# Patient Record
Sex: Male | Born: 1955
Health system: Southern US, Community
[De-identification: ages and names within clinical notes are randomized; demographics above are authoritative.]

## PROBLEM LIST (undated history)

## (undated) DIAGNOSIS — I2510 Atherosclerotic heart disease of native coronary artery without angina pectoris: Secondary | ICD-10-CM

## (undated) DIAGNOSIS — IMO0002 Reserved for concepts with insufficient information to code with codable children: Secondary | ICD-10-CM

## (undated) DIAGNOSIS — K7460 Unspecified cirrhosis of liver: Secondary | ICD-10-CM

## (undated) DIAGNOSIS — I219 Acute myocardial infarction, unspecified: Secondary | ICD-10-CM

## (undated) DIAGNOSIS — G8929 Other chronic pain: Secondary | ICD-10-CM

## (undated) DIAGNOSIS — J439 Emphysema, unspecified: Secondary | ICD-10-CM

## (undated) DIAGNOSIS — I714 Abdominal aortic aneurysm, without rupture, unspecified: Secondary | ICD-10-CM

## (undated) DIAGNOSIS — R0683 Snoring: Secondary | ICD-10-CM

## (undated) DIAGNOSIS — Z860100 Personal history of colon polyps, unspecified: Secondary | ICD-10-CM

## (undated) DIAGNOSIS — R51 Headache: Secondary | ICD-10-CM

## (undated) DIAGNOSIS — G2581 Restless legs syndrome: Secondary | ICD-10-CM

## (undated) DIAGNOSIS — A0472 Enterocolitis due to Clostridium difficile, not specified as recurrent: Secondary | ICD-10-CM

## (undated) DIAGNOSIS — R519 Headache, unspecified: Secondary | ICD-10-CM

## (undated) DIAGNOSIS — M549 Dorsalgia, unspecified: Secondary | ICD-10-CM

## (undated) DIAGNOSIS — K219 Gastro-esophageal reflux disease without esophagitis: Secondary | ICD-10-CM

## (undated) DIAGNOSIS — J449 Chronic obstructive pulmonary disease, unspecified: Secondary | ICD-10-CM

## (undated) DIAGNOSIS — I639 Cerebral infarction, unspecified: Secondary | ICD-10-CM

## (undated) DIAGNOSIS — G459 Transient cerebral ischemic attack, unspecified: Secondary | ICD-10-CM

## (undated) DIAGNOSIS — I9789 Other postprocedural complications and disorders of the circulatory system, not elsewhere classified: Secondary | ICD-10-CM

## (undated) DIAGNOSIS — E1142 Type 2 diabetes mellitus with diabetic polyneuropathy: Secondary | ICD-10-CM

## (undated) DIAGNOSIS — R160 Hepatomegaly, not elsewhere classified: Secondary | ICD-10-CM

## (undated) DIAGNOSIS — I00 Rheumatic fever without heart involvement: Secondary | ICD-10-CM

## (undated) DIAGNOSIS — R0602 Shortness of breath: Secondary | ICD-10-CM

## (undated) DIAGNOSIS — M5136 Other intervertebral disc degeneration, lumbar region: Secondary | ICD-10-CM

## (undated) DIAGNOSIS — N400 Enlarged prostate without lower urinary tract symptoms: Secondary | ICD-10-CM

## (undated) DIAGNOSIS — G4752 REM sleep behavior disorder: Secondary | ICD-10-CM

## (undated) DIAGNOSIS — R413 Other amnesia: Secondary | ICD-10-CM

## (undated) DIAGNOSIS — M79606 Pain in leg, unspecified: Secondary | ICD-10-CM

## (undated) DIAGNOSIS — E119 Type 2 diabetes mellitus without complications: Secondary | ICD-10-CM

## (undated) DIAGNOSIS — D696 Thrombocytopenia, unspecified: Secondary | ICD-10-CM

## (undated) DIAGNOSIS — I6381 Other cerebral infarction due to occlusion or stenosis of small artery: Secondary | ICD-10-CM

## (undated) DIAGNOSIS — E785 Hyperlipidemia, unspecified: Secondary | ICD-10-CM

## (undated) DIAGNOSIS — R161 Splenomegaly, not elsewhere classified: Secondary | ICD-10-CM

## (undated) DIAGNOSIS — I10 Essential (primary) hypertension: Secondary | ICD-10-CM

## (undated) DIAGNOSIS — I672 Cerebral atherosclerosis: Secondary | ICD-10-CM

## (undated) DIAGNOSIS — M5126 Other intervertebral disc displacement, lumbar region: Secondary | ICD-10-CM

## (undated) DIAGNOSIS — I209 Angina pectoris, unspecified: Secondary | ICD-10-CM

## (undated) DIAGNOSIS — M51369 Other intervertebral disc degeneration, lumbar region without mention of lumbar back pain or lower extremity pain: Secondary | ICD-10-CM

## (undated) HISTORY — DX: Atherosclerotic heart disease of native coronary artery without angina pectoris: I25.10

## (undated) HISTORY — PX: CORONARY ANGIOPLASTY WITH STENT PLACEMENT: SHX49

## (undated) HISTORY — PX: COLONOSCOPY: SHX174

## (undated) HISTORY — DX: Acute myocardial infarction, unspecified: I21.9

## (undated) HISTORY — DX: Essential (primary) hypertension: I10

## (undated) HISTORY — PX: CARDIAC CATHETERIZATION: SHX172

## (undated) HISTORY — DX: Cerebral infarction, unspecified: I63.9

## (undated) HISTORY — PX: OTHER SURGICAL HISTORY: SHX169

## (undated) HISTORY — DX: Hyperlipidemia, unspecified: E78.5

## (undated) HISTORY — DX: REM sleep behavior disorder: G47.52

## (undated) HISTORY — DX: Abdominal aortic aneurysm, without rupture: I71.4

## (undated) HISTORY — PX: TONSILLECTOMY: SUR1361

## (undated) HISTORY — DX: Other cerebral infarction due to occlusion or stenosis of small artery: I63.81

## (undated) HISTORY — PX: COLONOSCOPY W/ BIOPSIES: SHX1374

## (undated) HISTORY — DX: Chronic obstructive pulmonary disease, unspecified: J44.9

## (undated) HISTORY — DX: Shortness of breath: R06.02

## (undated) HISTORY — DX: Abdominal aortic aneurysm, without rupture, unspecified: I71.40

## (undated) HISTORY — DX: Other postprocedural complications and disorders of the circulatory system, not elsewhere classified: I97.89

## (undated) HISTORY — DX: Pain in leg, unspecified: M79.606

---

## 1998-05-11 ENCOUNTER — Emergency Department (HOSPITAL_COMMUNITY): Admission: EM | Admit: 1998-05-11 | Discharge: 1998-05-11 | Payer: Self-pay | Admitting: Emergency Medicine

## 2001-02-16 ENCOUNTER — Inpatient Hospital Stay (HOSPITAL_COMMUNITY): Admission: EM | Admit: 2001-02-16 | Discharge: 2001-02-22 | Payer: Self-pay | Admitting: Emergency Medicine

## 2001-02-16 ENCOUNTER — Encounter: Payer: Self-pay | Admitting: Emergency Medicine

## 2001-02-18 ENCOUNTER — Encounter: Payer: Self-pay | Admitting: Cardiothoracic Surgery

## 2001-02-18 HISTORY — PX: CORONARY ARTERY BYPASS GRAFT: SHX141

## 2001-02-19 ENCOUNTER — Encounter: Payer: Self-pay | Admitting: Cardiothoracic Surgery

## 2001-02-20 ENCOUNTER — Encounter: Payer: Self-pay | Admitting: Cardiothoracic Surgery

## 2001-02-22 ENCOUNTER — Encounter: Payer: Self-pay | Admitting: Cardiothoracic Surgery

## 2001-05-05 ENCOUNTER — Ambulatory Visit (HOSPITAL_COMMUNITY): Admission: RE | Admit: 2001-05-05 | Discharge: 2001-05-05 | Payer: Self-pay | Admitting: Interventional Cardiology

## 2001-05-21 ENCOUNTER — Ambulatory Visit (HOSPITAL_COMMUNITY): Admission: RE | Admit: 2001-05-21 | Discharge: 2001-05-22 | Payer: Self-pay | Admitting: Interventional Cardiology

## 2001-10-29 ENCOUNTER — Ambulatory Visit (HOSPITAL_COMMUNITY): Admission: RE | Admit: 2001-10-29 | Discharge: 2001-10-31 | Payer: Self-pay | Admitting: Interventional Cardiology

## 2001-11-16 ENCOUNTER — Encounter: Payer: Self-pay | Admitting: Gastroenterology

## 2001-11-16 ENCOUNTER — Encounter: Admission: RE | Admit: 2001-11-16 | Discharge: 2001-11-16 | Payer: Self-pay | Admitting: Gastroenterology

## 2001-11-26 ENCOUNTER — Encounter: Admission: RE | Admit: 2001-11-26 | Discharge: 2001-11-26 | Payer: Self-pay | Admitting: Gastroenterology

## 2001-11-26 ENCOUNTER — Encounter: Payer: Self-pay | Admitting: Gastroenterology

## 2002-04-16 ENCOUNTER — Ambulatory Visit (HOSPITAL_COMMUNITY): Admission: RE | Admit: 2002-04-16 | Discharge: 2002-04-16 | Payer: Self-pay | Admitting: Gastroenterology

## 2002-04-16 ENCOUNTER — Encounter (INDEPENDENT_AMBULATORY_CARE_PROVIDER_SITE_OTHER): Payer: Self-pay | Admitting: Specialist

## 2002-06-10 DIAGNOSIS — I639 Cerebral infarction, unspecified: Secondary | ICD-10-CM

## 2002-06-10 HISTORY — DX: Cerebral infarction, unspecified: I63.9

## 2002-10-27 ENCOUNTER — Encounter: Payer: Self-pay | Admitting: Interventional Cardiology

## 2002-10-27 ENCOUNTER — Ambulatory Visit (HOSPITAL_COMMUNITY): Admission: RE | Admit: 2002-10-27 | Discharge: 2002-10-27 | Payer: Self-pay | Admitting: Interventional Cardiology

## 2003-01-21 ENCOUNTER — Ambulatory Visit (HOSPITAL_COMMUNITY): Admission: RE | Admit: 2003-01-21 | Discharge: 2003-01-21 | Payer: Self-pay | Admitting: Neurology

## 2003-01-21 ENCOUNTER — Encounter: Payer: Self-pay | Admitting: Neurology

## 2003-11-21 ENCOUNTER — Encounter: Admission: RE | Admit: 2003-11-21 | Discharge: 2003-11-21 | Payer: Self-pay | Admitting: Interventional Cardiology

## 2003-11-23 ENCOUNTER — Ambulatory Visit (HOSPITAL_COMMUNITY): Admission: RE | Admit: 2003-11-23 | Discharge: 2003-11-23 | Payer: Self-pay | Admitting: Interventional Cardiology

## 2004-01-12 ENCOUNTER — Inpatient Hospital Stay (HOSPITAL_COMMUNITY): Admission: EM | Admit: 2004-01-12 | Discharge: 2004-01-14 | Payer: Self-pay | Admitting: Emergency Medicine

## 2006-10-23 ENCOUNTER — Ambulatory Visit: Payer: Self-pay | Admitting: *Deleted

## 2006-12-08 ENCOUNTER — Encounter: Admission: RE | Admit: 2006-12-08 | Discharge: 2007-03-08 | Payer: Self-pay

## 2007-02-03 ENCOUNTER — Ambulatory Visit: Payer: Self-pay | Admitting: Surgery

## 2007-02-03 ENCOUNTER — Encounter (INDEPENDENT_AMBULATORY_CARE_PROVIDER_SITE_OTHER): Payer: Self-pay | Admitting: Interventional Cardiology

## 2007-02-03 ENCOUNTER — Ambulatory Visit (HOSPITAL_COMMUNITY): Admission: RE | Admit: 2007-02-03 | Discharge: 2007-02-03 | Payer: Self-pay | Admitting: Interventional Cardiology

## 2007-06-11 DIAGNOSIS — E119 Type 2 diabetes mellitus without complications: Secondary | ICD-10-CM

## 2007-06-11 HISTORY — DX: Type 2 diabetes mellitus without complications: E11.9

## 2007-09-23 ENCOUNTER — Emergency Department (HOSPITAL_COMMUNITY): Admission: EM | Admit: 2007-09-23 | Discharge: 2007-09-23 | Payer: Self-pay | Admitting: Emergency Medicine

## 2007-09-25 ENCOUNTER — Encounter: Admission: RE | Admit: 2007-09-25 | Discharge: 2007-09-25 | Payer: Self-pay | Admitting: Orthopedic Surgery

## 2007-10-01 ENCOUNTER — Emergency Department (HOSPITAL_COMMUNITY): Admission: EM | Admit: 2007-10-01 | Discharge: 2007-10-01 | Payer: Self-pay | Admitting: Emergency Medicine

## 2007-10-22 ENCOUNTER — Ambulatory Visit: Payer: Self-pay | Admitting: *Deleted

## 2008-04-21 ENCOUNTER — Ambulatory Visit: Payer: Self-pay | Admitting: *Deleted

## 2008-08-11 ENCOUNTER — Inpatient Hospital Stay (HOSPITAL_BASED_OUTPATIENT_CLINIC_OR_DEPARTMENT_OTHER): Admission: RE | Admit: 2008-08-11 | Discharge: 2008-08-11 | Payer: Self-pay | Admitting: Interventional Cardiology

## 2008-10-20 ENCOUNTER — Ambulatory Visit: Payer: Self-pay | Admitting: *Deleted

## 2008-10-27 ENCOUNTER — Ambulatory Visit: Payer: Self-pay | Admitting: *Deleted

## 2008-10-27 ENCOUNTER — Encounter: Admission: RE | Admit: 2008-10-27 | Discharge: 2008-10-27 | Payer: Self-pay | Admitting: *Deleted

## 2009-04-28 ENCOUNTER — Ambulatory Visit: Payer: Self-pay | Admitting: Vascular Surgery

## 2009-07-28 ENCOUNTER — Encounter: Admission: RE | Admit: 2009-07-28 | Discharge: 2009-07-28 | Payer: Self-pay | Admitting: Gastroenterology

## 2009-10-25 ENCOUNTER — Ambulatory Visit: Payer: Self-pay | Admitting: Vascular Surgery

## 2009-11-09 ENCOUNTER — Emergency Department (HOSPITAL_COMMUNITY): Admission: EM | Admit: 2009-11-09 | Discharge: 2009-11-09 | Payer: Self-pay | Admitting: Emergency Medicine

## 2010-03-05 ENCOUNTER — Encounter: Admission: RE | Admit: 2010-03-05 | Discharge: 2010-03-05 | Payer: Self-pay | Admitting: Neurology

## 2010-04-26 ENCOUNTER — Ambulatory Visit: Payer: Self-pay | Admitting: Vascular Surgery

## 2010-05-28 ENCOUNTER — Ambulatory Visit: Payer: Self-pay | Admitting: Vascular Surgery

## 2010-09-20 LAB — POCT I-STAT 3, VENOUS BLOOD GAS (G3P V)
TCO2: 26 mmol/L (ref 0–100)
pCO2, Ven: 46.6 mmHg (ref 45.0–50.0)
pH, Ven: 7.325 — ABNORMAL HIGH (ref 7.250–7.300)

## 2010-09-20 LAB — POCT I-STAT 3, ART BLOOD GAS (G3+)
Acid-base deficit: 1 mmol/L (ref 0.0–2.0)
O2 Saturation: 94 %
TCO2: 26 mmol/L (ref 0–100)

## 2010-09-20 LAB — POCT I-STAT GLUCOSE: Operator id: 194801

## 2010-10-23 NOTE — Cardiovascular Report (Signed)
NAME:  Christian Mccall, Christian Mccall NO.:  [id number]   MEDICAL RECORD NO.:  [id number]          PATIENT TYPE:  OIB   LOCATION:  1966                         FACILITY:  [facility]   PHYSICIAN:  Lyn Records, M.D.   DATE OF BIRTH:  05-18-1956   DATE OF PROCEDURE:  08/11/2008  DATE OF DISCHARGE:  08/11/2008                            CARDIAC CATHETERIZATION   INDICATIONS:  Progressive angina with a history of coronary artery  disease and previous bypass grafting and post-bypass percutaneous  intervention.  This study was done to define anatomy and guide therapy.   PROCEDURES PERFORMED:  1. Left heart catheterization.  2. Selective coronary angio.  3. Left ventriculography.  4. Right heart catheterization.   DESCRIPTION OF PROCEDURE:  A 4-French sheath was placed in the right  femoral artery and a 6-French sheath in the right femoral vein both  using the modified Seldinger technique.  Right heart cath was performed  with a 6-French Swan-Ganz catheter.  A 4-French A2 multipurpose  catheter, a #4 left Judkins 4-French catheter, and an internal mammary  artery catheter were used for selective engagement of the coronaries and  saphenous vein grafts.  The patient tolerated the procedure without  complications.   RESULTS:  1. Hemodynamic data:      a.     Mean right atrial pressure 9 mmHg.      b.     RV pressure 32/10.      c.     Mean main pulmonary artery pressure 31/11.      d.     Mean capillary wedge pressure 9 mmHg.      e.     LV pressure 100/12.      f.     Aortic pressure 100/68.  2. Left ventriculography:  Left ventricle demonstrates normal wall      motion.  EF 55%.  No MR is noted.  3. Coronary angiography.      a.     Left main coronary:  Widely patent.      b.     Left anterior descending coronary:  Totally occluded in the       midvessel.      c.     Circumflex artery:  99% OM #1 but this vessel is grafted.       It supplies collaterals to the distal right  coronary.      d.     Right coronary:  Totally occluded.  4. Bypass graft angiography.      a.     Saphenous vein graft to the diagonal #1:  Widely patent.      b.     Saphenous vein graft to the OM #1:  Diffuse disease with up       to 60% proximal stenosis but patent.      c.     Saphenous vein graft to RCA:  Totally occluded.  5. LIMA to LAD:  Widely patent..   CONCLUSION:  1. Bypass graft failure with occlusion of the SVG to the RCA.  2. Severe native disease with occlusion of  the LAD and RCA.  3. Normal left ventricular function.  4. Patent saphenous vein grafts to the diagonal and to the OM #1.  The      left internal mammary artery graft is also patent.      Lyn Records, M.D.  Electronically Signed     Lyn Records, M.D.  Electronically Signed    HWS/MEDQ  D:  11/17/2008  T:  11/18/2008  Job:  [id number]

## 2010-10-23 NOTE — Procedures (Signed)
CAROTID DUPLEX EXAM   INDICATION:  Followup CVA.   HISTORY:  Diabetes:  Yes.  Cardiac:  Yes.  Hypertension:  Yes.  Smoking:  No.  Previous Surgery:  No.  CV History:  CVA 03/2010.  Amaurosis Fugax No, Paresthesias No, Hemiparesis Yes left side numbness  at time of CVA                                       RIGHT             LEFT  Brachial systolic pressure:         139               142  Brachial Doppler waveforms:         Normal            Normal  Vertebral direction of flow:        Antegrade         Antegrade  DUPLEX VELOCITIES (cm/sec)  CCA peak systolic                   110               116  ECA peak systolic                   113               164  ICA peak systolic                   79                62  ICA end diastolic                   19                16  PLAQUE MORPHOLOGY:                  Calcific          Calcific  PLAQUE AMOUNT:                      Minimal           Minimal  PLAQUE LOCATION:                    Bifurcation       Bifurcation, ECA   IMPRESSION:  1. Bilateral internal carotid artery velocities suggestive of a 1%-39%      stenosis.  2. Left external carotid artery stenosis.  3. Antegrade vertebral arteries bilaterally.   ___________________________________________  Janetta Hora Fields, MD   EM/MEDQ  D:  05/28/2010  T:  05/28/2010  Job:  [id number]

## 2010-10-23 NOTE — Procedures (Signed)
DUPLEX ULTRASOUND OF ABDOMINAL AORTA   INDICATION:  Abdominal aortic aneurysm.   HISTORY:  Diabetes:  No.  Cardiac:  PTCA.  Hypertension:  Yes.  Smoking:  Previous.  Connective Tissue Disorder:  Family History:  Yes.  Previous Surgery:  No.   DUPLEX EXAM:         AP (cm)                   TRANSVERSE (cm)  Proximal             2.8 cm                    2.8 cm  Mid                  4.9 cm                    4.8 cm  Distal               3.6 cm                    3.9 cm  Right Iliac          Not visualized            Not visualized  Left Iliac           Not visualized            Not visualized   PREVIOUS:  Date:  10/20/2008  AP:  4.84  TRANSVERSE:  5.2   IMPRESSION:  1. Stable mid to distal abdominal aortic aneurysm measurements noted      when compared to the previous exam and the CT study on 10/27/2008.  2. Unable to adequately visualize the bilateral common iliac arteries      due to overlying bowel gas patterns and patient body habitus.   ___________________________________________  Janetta Hora. Fields, MD   CH/MEDQ  D:  04/28/2009  T:  04/28/2009  Job:  [id number]

## 2010-10-23 NOTE — Procedures (Signed)
DUPLEX ULTRASOUND OF ABDOMINAL AORTA   INDICATION:  Followup evaluation of abdominal aortic aneurysm.   HISTORY:  Diabetes:  No.  Cardiac:  Multiple PTCAs.  Hypertension:  Yes.  Smoking:  Quit in 2002.  Connective Tissue Disorder:  Family History:  Patient has a family history of cerebral aneurysm.  Previous Surgery:  No.   DUPLEX EXAM:         AP (cm)                   TRANSVERSE (cm)  Proximal             4.07 cm                   4.16 cm  Mid                  4.31 cm                   4.17 cm  Distal               2.40 cm                   2.61 cm  Right Iliac          1.12 cm                   1.13 cm  Left Iliac           0.98 cm                   1.66 cm   PREVIOUS:  Date: 10/21/2007  AP:  4.0  TRANSVERSE:  3.9   IMPRESSION:  Abdominal aortic aneurysm measurements are slightly larger  than previously recorded.   ___________________________________________  P. Liliane Bade, M.D.   MC/MEDQ  D:  10/22/2007  T:  10/22/2007  Job:  [id number]

## 2010-10-23 NOTE — Procedures (Signed)
DUPLEX ULTRASOUND OF ABDOMINAL AORTA   INDICATION:  Abdominal aortic aneurysm.   HISTORY:  Diabetes:  Yes.  Cardiac:  PTCA.  Hypertension:  Yes.  Smoking:  Previous.  Connective Tissue Disorder:  Family History:  Yes.  Previous Surgery:  No.   DUPLEX EXAM:         AP (cm)                   TRANSVERSE (cm)  Proximal             4.13 cm                   4.23 cm  Mid                  4.78 cm                   4.87 cm  Distal               3.13 cm                   3.45 cm  Right Iliac          1.29 cm                   1.75 cm  Left Iliac           1.47 cm                   1.70 cm   PREVIOUS:  Date:  AP:  4.9  TRANSVERSE:  4.8   IMPRESSION:  1. Abdominal aortic aneurysm with the largest diameter of 4.78 x 4.87      cm.  2. Somewhat limited due to overlying bowel gas pattern and patient      body habitus.   ___________________________________________  Janetta Hora. Fields, MD   NT/MEDQ  D:  10/25/2009  T:  10/25/2009  Job:  [id number]

## 2010-10-23 NOTE — Procedures (Signed)
DUPLEX ULTRASOUND OF ABDOMINAL AORTA   INDICATION:  Followup abdominal aortic aneurysm.   HISTORY:  Diabetes:  No.  Cardiac:  PTCA.  Hypertension:  Yes.  Smoking:  Quit.  Connective Tissue Disorder:  Family History:  Previous Surgery:   DUPLEX EXAM:         AP (cm)                   TRANSVERSE (cm)  Proximal             Not well visualized       cm  Mid                  4.84 cm                   5.20 cm  Distal               2.89 cm                   2.63 cm  Right Iliac          1.68 cm                   2.14 cm  Left Iliac           1.59 cm                   1.58 cm   PREVIOUS:  Date:  AP:  4.43 cm  TRANSVERSE:  4.25 cm   IMPRESSION:  Abdominal aortic aneurysm noted with the largest  measurement of 4.84 cm x 5.20 cm.   ___________________________________________  P. Liliane Bade, M.D.   MG/MEDQ  D:  10/20/2008  T:  10/20/2008  Job:  [id number]

## 2010-10-23 NOTE — Assessment & Plan Note (Signed)
OFFICE VISIT   ATWOOD, ADCOCK  DOB:  12/02/1955                                       10/27/2008  YQMVH#:[id number]   Mr. Higginbotham returned to the office today for continued surveillance of his  AAA.  He had undergone an ultrasound Oct 20, 2008 which revealed  enlargement of his AAA to 5.2 cm.  A CT angiogram was therefore ordered.  On my review of the CT angiogram, maximal diameter aorta is 4.8 cm.   Mr. Dillenbeck has been free of any abdominal pain or back pain.   PHYSICAL EXAMINATION:  His blood pressure is 110/68, pulse 72 per  minute.  He is alert and oriented.  Abdomen:  Soft, nontender.  No  palpable masses.  No organomegaly.  Normal bowel sounds without bruits.  2+ femoral pulses bilaterally.   Mr. Lamb and his wife were reassured that CT scan fails to reveal any  evidence of significant enlargement of his aneurysm and maximal diameter  4.8 cm is identified.  Will plan to follow up with him again in 6 months  with a followup ultrasound.   Balinda Quails, M.D.  Electronically Signed   PGH/MEDQ  D:  10/27/2008  T:  10/28/2008  Job:  [id number]   cc:   Lyn Records, M.D.  Candyce Churn, M.D.

## 2010-10-23 NOTE — Assessment & Plan Note (Signed)
OFFICE VISIT   Christian Mccall, Christian Mccall  DOB:  [birth year]                                       10/25/2009  ZOXWR#:[id number]   The patient is a 55 year old male who we have been following for  abdominal aortic aneurysm.  He was last seen by Dr. Madilyn Fireman in May of  2010.  The aneurysm at that time was 4.8 cm in diameter.  Since Dr.  Madilyn Fireman has moved to [place] I have assumed this patient's care.   CHRONIC MEDICAL PROBLEMS:  Include hypertension, coronary artery  disease, elevated cholesterol and mild COPD.  These are all currently  controlled.  The patient's aneurysm was 3.8 cm in diameter in 2006.  It  has been measured as large as 4.9 cm in diameter on previous ultrasound.  Ultrasound today also showed the aneurysm to be 4.9 cm in diameter.   FAMILY HISTORY:  The patient has significant family history of aneurysm  disease with his mother, maternal aunt and maternal uncle all having  history of abdominal aortic aneurysm.  The patient has a history of  chronic abdominal pain as well.  This has been present for several years  and is unchanged.   SOCIAL HISTORY:  He is married, has two children.  He is disabled.  He  is a former smoker, quit in 2002.   REVIEW OF SYSTEMS:  Full 12 point review of systems was performed with  the patient today.  Please see intake referral form for details  regarding this.   PHYSICAL EXAM:  Vital signs:  Blood pressure 138/84 in the right arm,  135/77 in the left arm, oxygen saturation is 96% on room air, heart rate  is 69 and regular.  HEENT:  Unremarkable.  Neck:  Has 2+ carotid pulses  without bruit.  Chest:  Clear to auscultation.  Cardiac:  Exam is  regular rate and rhythm without murmur.  Abdomen:  Soft, nontender,  nondistended.  No palpable masses.  Extremities:  He has 2+ radial, 2+  femoral pulses bilaterally.  He has 2+ posterior tibial pulses  bilaterally.  He has 1+ right dorsalis pedis pulse and absent left  dorsalis  pedis pulse.  He has scars on the right leg consistent with  right leg saphenectomy.  Musculoskeletal:  Exam shows no significant  major joint deformities.  Neurologic:  Exam shows symmetric upper  extremity and lower extremity motor strength which 5/5.  Skin:  Has no  open ulcers or rashes.   He had an abdominal aortic ultrasound today which showed the aneurysm  diameter is 4.87 cm.   At this point the patient has a 4.87 cm abdominal aortic aneurysm.  If  this ever reaches 5.5 cm in diameter we would consider repair at that  point.  However, since the patient does have a strong family history of  aneurysm we may consider fixing this sooner if he has concerns about  this.  He also has a history of chronic abdominal pain and really has  never really had a full explanation for this.  If his chronic abdominal  pain continued over time then another consideration would be to consider  fixing his aneurysm for this as well.  He will follow up with me in six  months' time for repeat aortic ultrasound.     Janetta Hora. Fields, MD  Electronically  Signed   CEF/MEDQ  D:  10/25/2009  T:  10/26/2009  Job:  [id number]   cc:   Gwen Pounds, MD  Lyn Records, M.D.

## 2010-10-23 NOTE — Procedures (Signed)
DUPLEX ULTRASOUND OF ABDOMINAL AORTA   INDICATION:  Abdominal aortic aneurysm.   HISTORY:  Diabetes:  Yes  Cardiac:  PTCA  Hypertension:  Yes  Smoking:  Quit 2002  Connective Tissue Disorder:  Family History:  Mother, maternal aunt, maternal uncle  Previous Surgery:  No   DUPLEX EXAM:         AP (cm)                   TRANSVERSE (cm)  Proximal             4.09 cm                   4.20 cm  Mid                  4.71 cm                   4.83 cm  Distal               3.01 cm                   2.87 cm  Right Iliac          cm                        cm  Left Iliac           cm                        cm   PREVIOUS:  Date: Oct 25, 2009  AP:  4.78  TRANSVERSE:  4.87   IMPRESSION:  1. Abdominal aortic aneurysm with largest diameter of 4.71 x 4.83 cm.  2. Iliacs unable to be imaged due to overlying bowel gas and patient      body habitus.   ___________________________________________  Janetta Hora. Fields, MD   EM/MEDQ  D:  04/26/2010  T:  04/26/2010  Job:  [id number]

## 2010-10-23 NOTE — Procedures (Signed)
DUPLEX ULTRASOUND OF ABDOMINAL AORTA   INDICATION:  Follow up abdominal aortic aneurysm.   HISTORY:  Diabetes:  No.  Cardiac:  PTCA.  Hypertension:  Yes.  Smoking:  Quit.  Connective Tissue Disorder:  Family History:  Previous Surgery:   DUPLEX EXAM:         AP (cm)                   TRANSVERSE (cm)  Proximal             3.45 cm                   3.38 cm  Mid                  4.43 cm                   4.25 cm  Distal               3.20 cm                   3.00 cm  Right Iliac          1.09 cm                   1.43 cm  Left Iliac           1.34 cm                   1.35 cm   PREVIOUS:  Date:  AP:  4.31  TRANSVERSE:  4.17   IMPRESSION:  Abdominal aortic aneurysm noted with the largest  measurement of 4.43 cm X 4.25 cm.   ___________________________________________  P. Liliane Bade, M.D.   MG/MEDQ  D:  04/21/2008  T:  04/21/2008  Job:  [id number]

## 2010-10-26 NOTE — Discharge Summary (Signed)
[place]  Patient:    LARKIN, MORELOS Visit Number: [id number] MRN: [id number]          Service Type: MED Location: 2000 2015 01 Attending Physician:  Mikey Bussing Dictated by:   Areta Haber, P.A.-C. Admit Date:  02/16/2001 Disc. Date: 02/22/01   CC:         Mikey Bussing, M.D.  [facility] office  Meade Maw, M.D.  Darci Needle, M.D.   Discharge Summary  HISTORY OF PRESENT ILLNESS:  This is a 55 year old male with a known history of coronary artery disease who is status post myocardial infarction approximately 1987.  The old chart was not available for review this hospital admission.  He has essentially been pain-free until May 2002.  Of note, the patient did have a stress test performed in December 2000, which was reportedly normal.  Chest pain has been intermittent, and on date of admission he awoke with both nausea and vomiting, and approximately 15 minutes after the vomiting episode he developed chest pain.  This pain was unrelieved with sublingual nitroglycerin, and subsequently he presented to the emergency room. He was placed on intravenous nitroglycerin, morphine, Toradol, and Mylanta, and received relief.  The patient is notable to have been noncompliant with his medications which include Zocor, aspirin, and Hyzaar.  He does also have a significant history of tobacco abuse of 1-1/2 or more packs of cigarettes a day for approximately 30 years.  He was felt to require admission for further evaluation and treatment.  ADMISSION MEDICATIONS: 1. Zocor 40 mg q.d. 2. Hyzaar 12.5/25 one q.d. 3. Claritin p.r.n. 4. Aspirin 325 mg q.d.  ALLERGIES:  No known drug allergies.  PAST SURGICAL HISTORY:  None.  Review of systems, family history, social history, and physical examination, please see the history and physical noted upon admission.  HOSPITAL COURSE:  The patient was admitted with the impression of  possible unstable angina.  EKG showed nonspecific ST changes, and the patients cardiac enzymes were negative.  He was restarted on aspirin and Lopressor, and scheduled for cardiac catheterization.  This was performed on 02/17/01, by Dr. Katrinka Blazing, and significant findings included overall normal left ventricular function with ejection fraction of 60%.  Coronary sineangiography showed the left main to be normal, the left anterior descending artery to have a 70 to 80% segmental calcified proximal to mid left anterior descending artery lesion, as well as an 85% lesion after diagonal #2.  The circumflex showed a 50% obtuse marginal #2 lesion, and a 60 to 70% obtuse marginal #1 lesion.  The right coronary artery showed severe diffuse mid and distal right coronary artery disease with 85 to 95% end stent and ______ lesions.  Impression was severe three vessel coronary artery disease with normal left ventricular function, and the patient was felt to be a surgical candidate.  Cardiac surgical consultation was obtained with Dr. Kathlee Nations Trigt who evaluated the patient and his studies, and agreed with recommendations to proceed with surgical revascularization.  On 02/18/01, the patient underwent the following procedure:  Coronary artery bypass grafting x 4.  The following grafts were placed:  Left internal mammary artery to the left anterior descending artery, saphenous vein graft to the posterior descending coronary artery, saphenous vein graft to the obtuse marginal, saphenous vein graft to the diagonal.  Cross clamp time was 62 minutes, pump time was 130 minutes.  The patient tolerated the procedure well, was taken to the surgical  intensive care unit in stable condition.  POSTOPERATIVE HOSPITAL COURSE:  The patient has done well.  He has maintained stable hemodynamics and normal sinus rhythm.  Postoperative EKG was without significant change.  Laboratory values showed a good postoperative  hemoglobin and hematocrit with most recent measured on 02/20/01 at 12 and 34, respectively.  BUN and creatinine as well as electrolytes have remained stable.  The patient initially did have some mild difficulty with voiding, but Foley catheter was discontinued, and the patient resumed voiding in a normal manner.  The patient initially did have some mild increase in his body temperature, but he has defervesced, and is showing no signs or symptoms of infection.  His incisions are healing well.  He has tolerated a gradual increase in activities, enough for level of postoperative convalescence using cardiac rehabilitation phase I modalities.  The patient has been seen by the smoking cessation service, and this has been encouraged.  He is currently using a nicotine patch for this.  He is tolerating diet without significant problems.  His overall status is felt to be tentatively quite stable for discharge in the morning of 02/22/01, pending morning round evaluation.  FINAL DIAGNOSIS:  Coronary artery disease as described above, now status post coronary artery bypass grafting.  OTHER DIAGNOSES: 1. Hypertension. 2. History of significant tobacco abuse. 3. History of dyslipidemia.  DISCHARGE MEDICATIONS: 1. Aspirin 325 mg q.d. 2. Nicotine patch 21 mg q.24h. 3. Lopressor 25 mg b.i.d. 4. Tequin 400 mg q.d. 5. Cozaar 25 mg q.d. 6. Tylox one or two q.4-6h. p.r.n.  Please note the patient was started on Tequin, as well as given nebulizer treatments throughout the postoperative hospital course in assistance with mobilizing pulmonary secretions, as well as routine pulmonary toilet, and has responded to this quite well.  He is maintaining good oxygen saturations on room air, and no significant difficulties with shortness of breath symptoms.   DISCHARGE INSTRUCTIONS:  The patient received written instructions in regard to medications, activity, diet, wound care, and followup.  FOLLOWUP: 1. Dr.  Donata Clay in three weeks.  Staple removal at Dr. Vincent Gros office    next week. 2. Dr. Katrinka Blazing in two weeks.  CONDITION ON DISCHARGE:  Stable and improved. Dictated by:   Areta Haber, P.A.-C. Attending Physician:  Mikey Bussing DD:  02/21/01 TD:  02/21/01 Job: [id number]

## 2010-10-26 NOTE — Cardiovascular Report (Signed)
[facility]  Patient:    Christian Mccall, Christian Mccall Visit Number: [id number] MRN: [id number]          Service Type: CAT Location: [facility] [id number] Attending Physician:  Lyn Records. Iii Dictated by:   Darci Needle, M.D. Proc. Date: 10/29/01 Admit Date:  10/29/2001 Discharge Date: 10/31/2001   CC:         Mikey Bussing, M.D.  Pearla Dubonnet, M.D.   Cardiac Catheterization  INDICATIONS FOR PROCEDURE:  Class 4 angina.  PROCEDURES PERFORMED: 1. Left heart catheterization. 2. Selective coronary angiography. 3. Saphenous vein graft angiography. 4. Left internal mammary artery angiography. 5. Percutaneous coronary intervention on the native right coronary including    angioplasty and brachytherapy.  DESCRIPTION OF PROCEDURE:  After informed consent, a #6 French sheath was inserted into the right femoral artery using the modified Seldinger technique. A #6 French A2 multipurpose catheter was used for saphenous vein graft angiography and selective left and right coronary angiography.  An internal mammary artery catheter was used for left internal mammary artery angiography. Catheter damping was noted with each engagement of the left internal mammary. Intracoronary nitroglycerin was administered.  After reviewing the cineangiograms, it was noted that the anterior wall territory was potentially ischemic.  It fills briskly by retrograde flow when the saphenous vein graft to the diagonal is injected; however, there is a high-grade obstruction at the communication of the diagonal to the LAD.  There is reflux of contrast up the mammary suggesting that the internal mammary flow is limited.  There is no antegrade flow into the LAD via the native LAD system and there is antegrade flow via the internal mammary.  We also noted that there is diffuse in-stent restenosis in the native right coronary which had been recanalized during a December 2002  procedure with stent deployment in the region just proximal to the PDA back to the mid RCA overlapping with stents placed in 1998.  After reviewing the cineangiograms and also in consultation with Charlies Constable, M.D., I proceeded with attempted recanalization of the in-stent restenosis in the right coronary native circulation and brachytherapy.  We upgraded to a #7 French sheath in the right femoral artery.  We used a JR4 guide catheter and BMW wire.  Heparin, 5000 units, was administered.  A double bolus followed by an Integrilin infusion was begun.  ACT was documented to be 267 seconds.  Angioplasty was then performed using a 30-mm long 3.5-mm CrossSail balloon.  Angioplasty proceeded uneventfully; however, there was some watermelon-seeding of the balloon in the proximal portion of the stented region into the nonstented region and a linear dissection proximal to the deployed stents was noted.  This dissection did not appear to compromise flow and remained patent throughout the initial part of the intervention.  We subsequently did cutting balloon angioplasty starting distally and in an overlapping fashion progressing to the mid portion, all within the stented region.  After preparing the vessel, brachytherapy was then performed.  Using the [facility] system, 3.5-mm vessel diameter as the reference size, we did an overlapping run starting beyond the PDA and ending in the proximal right coronary with the [facility] system.  The total dwell time was 4 minutes, 45 seconds.  At completion of brachytherapy and after retrieval of the brachytherapy catheter, a frank clot was noted in the right coronary system and also on the brachytherapy balloon.  An ACT was then checked and it was noted to be 116.  An additional 4000 units of heparin was administered and the ACT increased to 320 seconds.  With clot now present in the vessel and essential total occlusion of the vessel, there were no chest pain  symptoms, no EKG changes, and the patient was stable.  After reestablishing adequate antithrombotic parameters, angioplasty was then repeated starting distally and progressing to the proximal portion of the vessel which brought about recanalization but also evidence of residual thrombus within the vessel.  We then watched the vessel for a considerable period of time and there was no evidence of acute reocclusion.  I contemplated stenting the proximal dissection in the right coronary but considering the absence of ischemic symptoms in this patient, I was not convinced that this vessel is really the source of the patients clinical symptomatology and felt that placing an additional stent within a region that has undergone brachytherapy was probably not the best judgment.  In essence, it appears to me at this time that whether this native vessel is opened or closed, there is no significant clinical consequence to the patient.  ACT post procedure was greater than 250 seconds. There was TIMI grade 2 flow in the native right coronary.  The segment in the distal right coronary between the PDA and the LV branch was widely patent. There was also reflux of contrast from the saphenous vein graft that supplies the PDA into the distal native right coronary beyond the PDA.   RESULTS:   I. Hemodynamic data      A. Aortic pressure 112/78.      B. Left ventricular pressure:  Not performed.  II. Left ventriculography:  Not performed. III. Selective coronary angiography      A. Left main coronary:  Widely patent.      B. Left anterior descending coronary:  Totally occluded after the first         septal perforator.      C. Circumflex artery:  The circumflex is patent.  There is 99% stenosis         in a small first obtuse marginal.  There is competitive flow with the         saphenous vein graft to this obtuse marginal.  Two additional larger         branches are widely patent.       D. Right coronary:   The right coronary artery contains high-grade 90%+         multiple stenoses diffusely within the mid and distal stents in the         mid right coronary.  A large left ventricular branch is patent.  The         PDA contains a segmental 90% stenosis proximal to the graft insertion         site in the mid PDA.  IV. Bypass graft angiography      A. Saphenous vein graft to the obtuse marginal:  Patent.      B. Saphenous vein graft to the first diagonal:  Widely patent.  There is         reflux of contrast from the diagonal into the LAD.  There is a         high-grade obstruction at the bifurcation of the native LAD into this         diagonal.  During this graft injection, there is brisk reflux of         contrast into the left internal mammary that is relatively small/  atretic.      C. Saphenous vein graft to the PDA:  This graft is large and widely         patent.  It supplies brisk antegrade flow into the mid and distal         third of the PDA.  There is also reflux of contrast through the         heavily diseased proximal third of the PDA into the native distal         right coronary.      D. Left internal mammary graft to the LAD:  This graft is patent.  There         is catheter damping with each engagement.  Post IC nitroglycerin, the         mammary increases in size only slightly.  There is free reflux of         contrast retrograde into the LAD and into the diagonal that is         supplied by the saphenous vein graft.  Again noted is the tight         stenosis at the bifurcation of the native LAD into this diagonal.   V. Percutaneous coronary intervention:  As described above, the diffusely      restenotic mid and distal right coronary region was dilated with a      relatively good result.  This was complicated by a linear dissection      proximal to the stents in the mid right coronary.  This did not appear      to be flow-limiting.  Brachytherapy was then performed but  at completion      of the brachytherapy dwell time which was approximately 4 minutes, 45      seconds, there was obvious clot throughout the vessel and the ACT was      noted to be 116.  This was managed by additional heparin, continuing      Integrilin, and redilatation after a period of time.  Flow was      reestablished although there was residual clot noted in the vessel.      Additionally, the dissection proximal to the stents appeared to be      somewhat worrisome but I made the decision not to stent this region      because the patient, even during periods of total occlusion, had no      angina symptoms suggesting that this territory is not responsible for the      patients ischemic symptoms.  CONCLUSIONS: 1. Mechanically successful angioplasty and brachytherapy of the native right    coronary complicated by extensive intracoronary thrombus formation.  Flow    was reestablished; however, during this prolonged and complicated    procedure, I became convinced that it is probably not the clinical cause    for the patients recurrent ischemic symptoms. 2. Class 4 angina, possibly related to inadequate left anterior descending    artery flow. 3. Patent saphenous vein grafts. 4. 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  PLAN:  Medical therapy.  Consider surgical revascularization of the LAD.  Will get surgical consult. Dictated by:   Darci Needle, M.D. Attending Physician:  Lyn Records. Iii DD:  10/30/01 TD:  11/01/01 Job: [id number]

## 2010-10-26 NOTE — H&P (Signed)
NAME:  Christian Mccall, Christian Mccall NO.:  [id number]   MEDICAL RECORD NO.:  [id number]                   PATIENT TYPE:  OUT   LOCATION:  PULM                                 FACILITY:  [facility]   PHYSICIAN:  Genene Churn. Love, M.D.                 DATE OF BIRTH:  14-May-1956   DATE OF ADMISSION:  11/23/2003  DATE OF DISCHARGE:  11/23/2003                                HISTORY & PHYSICAL   PATIENT ADDRESS:  [address], [place], [place].   This is one of multiple [facility] admissions for this 55 year old  right-handed white married male from [place], [place], admitted  from the emergency room for evaluation of recurrent left-sided weakness and  numbness.   HISTORY OF PRESENT ILLNESS:  Christian Mccall has a known prior history of coronary  artery disease and underwent four-vessel coronary artery bypass surgery in  September 2002.  He states that he had a myocardial infarction approximately  1988.  He reports that he has had multiple PTCAs by Dr. Verdis Prime, his  cardiologist.  He has had repeated episodes of left-sided weakness and  numbness thought to represent either TIA or stroke and then followed by Dr.  Kelli Hope, a neurologist in [place].  He has a past history of  chronic obstructive pulmonary disease with cigarette use but quit cigarettes  in September 2002.  He has had an abdominal aortic aneurysm followed by Dr.  Jake Samples.  This day, he was in his usual state of health.  At about 12 to  12:30 while holding his grandchild, he felt left-sided weakness and numbness  in a characteristic fashion of pain beginning in his left eye with numbness  in his left face, numbness in his left arm, and numbness in his left leg  lasting seconds to minutes and recurring in the morning.  There was no  associated chest pain, but he reports 90% of the time he has chest pain.  He  was brought to the emergency room about 3:35 and was found to  have a normal  examination at that time.  He has been independent in his activities of  daily living.   PAST MEDICAL HISTORY:  1. Coronary artery disease status post four-vessel coronary artery bypass     grafting February 18, 2001, with possible MI in the early 1980s.  2. He has had 10 angioplasties, 3 since his coronary artery bypass graft     surgery.  3. Abdominal aortic aneurysm.  4. Multiple right brain strokes, he reports, with evaluations by Dr.     Thad Ranger in the past.  He states that he had his first in 2003 and his     last in 2004.   MEDICATIONS:  1. Plavix 75 mg daily.  2. Aspirin 1 daily.  3. Zocor 50 mg daily.  4. Imdur 120 mg daily.  5. Lasix 40  mg daily.  6. Lopressor 50 mg daily.  7. Vitorin 10/40 once daily.   ALLERGIES:  He denies any allergies.   HABITS:  He drinks about 12 beers per year.  He is educated to 10th grade in  school.  His is disabled.  He is a former [employer] person.   FAMILY HISTORY:  His mother is 70, living and well.  His father died at 43  of unknown causes.  He has brothers, 18, 65, 84, and 6, and a sister 48  living and well.  He has two children, sons 26 and 12, living and well.   PHYSICAL EXAMINATION:  GENERAL: Well-developed white male.  VITAL SIGNS:  Blood pressure right arm 130/60, left arm 110/60, heart rate  60. There were no bruits.  NEUROLOGIC:  Mental Status:  Alert and oriented x 3, followed three-step  commands. There was no agnosia or denial of illnesses.  Cranial nerve  examination revealed visual fields full, disks flat, extraocular movements  full, corneals present.  No seventh nerve palsy.  Hearing intact.  Air  conduction greater than bone conduction.  Tongue midline.  Uvula midline.  Gags present.  Sternocleidomastoid and trapezius testing normal.  Motor  examination revealed 5/5 strength.  Coordination testing revealed finger-to-  nose, heel-to-shin, rapid alternating movements to be normal.  Sensory   examination intact to pinprick, touch, turn in position, and vibration.  Deep tendon reflexes 2+.  Plantar responses downgoing.  HEENT:  Tympanic membranes clear.  LUNGS: Clear to percussion and auscultation.  HEART:  Without murmurs.  ABDOMEN:  Bowel sounds normal.  There is no enlargement of liver, spleen, or  kidneys.  He was uncircumcised.   IMPRESSION:  1. Right brain transient ischemic attack (Code 434.9).  2. History of coronary artery disease status post myocardial infarction     status post four-vessel coronary artery bypass graft surgery February 18, 2001 (Code 429.2).  3. History of chronic obstructive pulmonary disease (Code 496).  4. Abdominal aortic aneurysm.  5. History of right brain strokes (Code 434.01).   PLAN:  Admit the patient for further evaluation.                                                Genene Churn. Sandria Manly, M.D.    JML/MEDQ  D:  01/12/2004  T:  01/12/2004  Job:  [id number]   cc:   Lyn Records III, M.D.  [address]  [address]  [place]  [place] [address]  Fax: [phone]

## 2010-10-26 NOTE — H&P (Signed)
[place]  Patient:    Christian Mccall Visit Number: [id number] MRN: [id number]          Service Type: MED Location: [phone] Attending Physician:  Christian Mccall Dictated by:   Christian Mccall, M.D. Admit Date:  02/16/2001   CC:         Christian Mccall, M.D.   History and Physical  REASON FOR ADMISSION:  Chest pain.  HISTORY:  Christian Mccall is a 55 year old gentleman with known coronary artery disease who is status post myocardial infarction approximately 1987.  Old chart is not available for review of this hospital admission.  He had been essentially pain-free until May 2002.  His initial onset of chest pain was described as substernal, severe.  EMS was summoned and he was given sublingual nitroglycerin spray with relief.  Of note, he had had a stress test performed in December 2000 which is reportedly normal.  He has had subsequent chest pain for the past six weeks.  His last use of nitroglycerin was six weeks prior to presentation when he again noted an episode of substernal chest pain.  On the night of admission he awoke with nausea and vomiting.  Approximately 15 minutes after vomiting he developed chest pain.  The chest pain was unrelieved with sublingual nitroglycerin and he subsequently presented to the emergency room.  In the emergency room the chest pain was relieved with IV nitroglycerin, morphine, Toradol, and Mylanta.  He has been noncompliant with his medication which includes Zocor, aspirin, and Hyzaar.  He continues to smoke.  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  MEDICATIONS: 1. Zocor 40 mg p.o. q.d. 2. Hyzaar 25/12.5. 3. Claritin p.r.n. 4. Aspirin 325 mg p.o. q.d.  ALLERGIES:  No known drug allergies.  PAST SURGICAL HISTORY:  None.  REVIEW OF SYSTEMS:  He has had diarrhea.  He has complaints of dizziness.  He has  had no fevers, no chills, no cough.  No pedal edema, no orthopnea, no PND, no tachyarrhythmias.  The chest pain does not appear to be exacerbated by exertion.  PHYSICAL EXAMINATION:  GENERAL:  Reveals a middle-aged male in no distress.  He is alert, oriented, talkative.  VITAL SIGNS:  His systolic pressure ranges from 696-295.  It was noted to be at 156 on presentation to the emergency room.  Heart rate 66.  His O2 saturation is 98%.  HEENT:  Unremarkable.  NECK:  Good carotid upstrokes.  No carotid bruits.  No neck vein distention is noted.  His thyroid is not palpable.  PULMONARY:  Reveals breath sounds which are equal and clear to auscultation. No use of accessory muscles.  CARDIOVASCULAR:  Reveals a regular rate and rhythm, normal S1, normal S2.  No rubs, murmurs, or gallops noted.  His PMI is within normal limits.  ABDOMEN:  Soft, benign, nontender.  No unusual bruits or pulsations are noted.  EXTREMITIES:  Reveal no peripheral edema.  Distal pulses are equal and palpable.  NEUROLOGIC:  Nonfocal.  Motor is 5/5 throughout.  SKIN:  Warm and dry.  LABORATORY DATA:  White count 15.7; hematocrit 47.6; hemoglobin 16.9; platelet count 191,000.  INR 1.0.  Sodium 141, potassium 3.7, BUN 19, creatinine 1.0. Normal liver enzymes.  CK is normal, troponin I 0.02.  Chest x-ray reveals no acute disease.  IMPRESSION: 1. Chest pain typical  for his previous anginal pain.  Will admit for    observation.  Initiate Lovenox, aspirin, and beta blockers.  Further    cardiac workup depending on the observation. 2. Hypertension.  Blood pressure currently is well controlled.  Will hold on    Hyzaar at this time. 3. Tobacco abuse.  Smoking cessation was discussed at length. 4. Dislipidemia.  He will need a fasting lipid profile. Dictated by:   Christian Mccall, M.D. Attending Physician:  Christian Mccall A DD:  02/16/01 TD:  02/16/01 Job: [id number]

## 2010-10-26 NOTE — Cardiovascular Report (Signed)
[place]. [facility]  Patient:    Christian Mccall, Christian Mccall Visit Number: [id number] MRN: [id number]          Service Type: CAT Location: [facility] [id number] Attending Physician:  Lyn Records. Iii Dictated by:   Darci Needle, M.D. Proc. Date: 05/05/01 Admit Date:  05/05/2001   CC:         Mikey Bussing, M.D.                        Cardiac Catheterization  INDICATION: Recurrent angina three months following coronary bypass surgery. The patient is 55 years of age. He had four-vessel bypass with a saphenous vein graft to the PDA, saphenous vein graft to the obtuse marginal #1, saphenous vein graft to the first diagonal and LIMA to the LAD. The patient stopped taking aspirin one-month after surgery.  PROCEDURES PERFORMED: 1. Left heart catheterization. 2. Selective coronary angiography. 3. Left ventriculography. 4. Saphenous vein graft angiography. 5. Left internal mammary artery angiography.  DESCRIPTION OF PROCEDURE: After informed consent, a 6 French sheath was placed in the right femoral artery using a modified Seldinger technique.  A 6 French A2 multipurpose catheter was used for hemodynamic recordings, left ventriculography by hand injection, selective left and right native vessel coronary angiography and saphenous vein graft angiography. We also used a 5 [place] internal mammary catheter for saphenous vein graft angiography of the obtuse marginal and diagonal grafts and also the left internal mammary artery angiogram. The patient tolerated the procedure without significant complications.  RESULTS:  I: HEMODYNAMIC DATA:     a. The aortic pressure 139/89 mmHg.     b. Left ventricular pressure 140/10 mmHg.  II: LEFT VENTRICULOGRAPHY: Overall LV function is normal. EF is estimated to be 70%. In comparison to the presurgical angiogram no significant change has occurred.  III: SELECTIVE CORONARY ANGIOGRAPHY:     a. Left main coronary: Relatively  short and free of any significant        obstruction.     b. Left anterior descending coronary artery: There is a long high-grade,        severe LAD stenosis in the mid vessel starting near the large first        diagonal and extending back to the region of the first septal        perforator. This appears to be at least 90% obstructed. The bifurcation        of the first diagonal and the continuation of the LAD contains ostial        stenoses in both the LAD and the diagonal that obstructs the artery by        up to 50%.     c. Circumflex artery: The circumflex coronary artery is large. It is free        of any significant obstruction. The artery contains luminal        irregularities. The first obtuse marginal contains a mid 70% stenosis        and there is faint competitive flow noted. The second and third obtuse        marginal branches are free of significant obstruction, although        numerous regions of luminal irregularity are noted.     d. Right coronary artery: The native right coronary artery is a vessel        that contains multiple regions of stent involving the proximal and mid  vessel. Throughout the entire mid section there are numerous stents        with multiple areas of high-grade stenoses up to 95% noted throughout        the mid and distal vessel but ending prior to the origin of the PDA.        The PDA demonstrates faint competitive flow in the distal PDA. There is        competition with the saphenous vein graft. A large left ventricular        branch contains irregularities but no significant obstruction.  IV: BYPASS GRAFT ANGIOGRAPHY:     a. Saphenous vein graft to the PDA: This saphenous vein graft is smooth        and widely patent but there is a distal anastomosis stenosis        obstructing the artery by up to 70%. This is in a region near a        surgical clip. Proximal to the anastomosis in the PDA there is a        70-90% obstruction preventing  adequate retrograde filling of the LV        branch.     b. Saphenous vein graft to the first obtuse marginal: This saphenous vein        graft is small in caliber. There is ostial and proximal 30%        narrowing.     c. Saphenous vein graft to the diagonal: This graft is large. It contains        irregularities in its proximal portion that either represent intimal        disruption, thrombus, or kinking. There is also a 30% stenosis near a        surgical clip in the mid vessel. The distal anastomosis is widely        patent. There is good reflex of contrast into the native LAD although        there is clear disease involving the bifurcation of the diagonal origin        and the continuation of the LAD distal.     d. Left internal mammary graft to the LAD: This graft is small exhibiting        diminished flow. There is ostial spasm of up to 50-60%. There is a        distal anastomotic 70% stenosis.  CONCLUSIONS: 1. Bypass graft failure with distal anastomosis stenosis involving the    saphenous vein graft to the posterior descending artery, distal    anastomosis stenosis involving the left internal mammary artery to the    left anterior descending and ostial mild narrowing of the saphenous vein    graft to the obtuse marginal. 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 2. Severe native coronary disease with high-grade severe obstruction in the    mid and distal right coronary, proximal to the origin of the posterior    descending artery. There is severe native vessel left anterior descending    disease with segmental stenosis proximal to the bifurcation of the left    anterior descending into the large diagonal and the continuation of the    left anterior descending. The native circumflex system is free of    significant obstruction but there is native disease in the first obtuse    marginal proximal to the saphenous vein graft  anastomosis. 3. Left ventricular  function is normal.   PLAN: I would do an adenosine Cardiolite. This would help me to localize whether the patients anginal pain is due to right coronary related ischemia, or to LAD ischemia. This will be a difficult situation in this young patient who is only 2 months status post surgery. Perhaps PCI on the native right coronary but leaving the PDA and then Cutting Balloon angioplasty on the distal anastomosis stenosis would be most appropriate if inferior ischemia is the patients problem. Dictated by:   Darci Needle, M.D. Attending Physician:  Lyn Records. Iii DD:  05/05/01 TD:  05/05/01 Job: [id number]

## 2010-10-26 NOTE — Cardiovascular Report (Signed)
[place]. [facility]  Patient:    Christian Mccall, Christian Mccall Visit Number: [id number] MRN: [id number]          Service Type: MED Location: [phone] Attending Physician:  Meade Maw A Dictated by:   Darci Needle, M.D. Proc. Date: 02/17/01 Admit Date:  02/16/2001   CC:         CVTS   Cardiac Catheterization  INDICATIONS:  Unstable angina in a patient with known prior coronary intervention, including right coronary stent.  He is a heavy smoker and is not compliant with his medical regimen.  PROCEDURES PERFORMED: 1. Left heart catheterization. 2. Selective coronary angiography. 3. Left ventriculography.  DESCRIPTION:  After informed consent, a 6-French sheath was inserted into the right femoral artery using the modified Seldinger technique.  A 6-French A2 multipurpose catheter was used for hemodynamic recordings, left ventriculography, selective left and right coronary angiography.  We also used a #4 6-French left Judkins catheter for coronary angiography.  The patient tolerated the procedure without complications.  RESULTS:  HEMODYNAMIC DATA: 1. Aortic pressure:  117/61. 2. Left ventricular pressure:  117/12 mmHg.  LEFT VENTRICULOGRAPHY:  The left ventricle is mildly dilated.  Overall function is preserved.  There is mild inferior hypokinesis.  EF is 50%.  CORONARY ANGIOGRAPHY:  Both the left and right coronary arteries are heavily calcified. 1. LEFT MAIN CORONARY:  The left main coronary artery is short and free of    any significant obstruction. 2. LEFT ANTERIOR DESCENDING CORONARY:  Heavily calcified.  It was enlarged.    It gives origin a large first septal perforator that arises horizontally    in the anteventricular septum, giving multiple septal branches.  The LAD    beyond the septal perforator contains a shelf-like lesion within a    calcified region that is blocked between 60% and 80%.  After the second    diagonal, there is a  focal high-grade stenosis.  The LAD wraps around    the left ventricular apex.  The large second diagonal is free of any    significant obstruction. 3. CIRCUMFLEX CORONARY ARTERY:  Large.  It trifurcates.  The first branch    contains a 50-70% proximal stenosis.  There is a small ______ 3 obtuse    marginal branches.  The second obtuse marginal contains luminal    irregularities, with a 60% mid vessel stenosis.  The third obtuse    marginal branch contains luminal irregularities. 4. RIGHT CORONARY ARTERY:  Heavily calcified.  Calcium and stents are noted    in the mid and distal vessel.  There is segmental in-stent diffuse    restenosis in the mid vessel, with up to 90-95% stenosis.  There is    also a ______ lesion distally before the PDA that is 90%.  The PDA and    left ventricular branch are relatively large and free of significant    obstruction.  CONCLUSIONS: 1. Severe left anterior descending and right coronary disease.  There is    mild to moderate circumflex disease.  The disease in the right coronary    is within previously implanted stents. 2. Preserved left ventricular function.  PLAN:  The patient needs to be considered for coronary artery bypass grafting. ictated by:   Darci Needle, M.D. Attending Physician:  Meade Maw A DD:  02/17/01 TD:  02/17/01 Job: [id number]

## 2010-10-26 NOTE — Cardiovascular Report (Signed)
[place]. [facility]  Patient:    Christian Mccall, Christian Mccall Visit Number: [id number] MRN: [id number]          Service Type: CAT Location: [id number] [phone] Attending Physician:  Lyn Records. Iii Dictated by:   Darci Needle, M.D. Proc. Date: 05/21/01 Admit Date:  05/21/2001   CC:         Mikey Bussing, M.D.   Cardiac Catheterization  INDICATION FOR PROCEDURE:  Recurrent angina following recent coronary bypass surgery.  Diagnostic coronary angiography on May 05, 2001 demonstrated evidence of stenosis proximal to the graft insertion site in the PDA leaving the large left ventricular branch threatened because of persistent high grade disease within the native right coronary.  A Cardiolite study demonstrated inferobasilar ischemia.  He has been having recurrent angina since surgery.  PROCEDURES PERFORMED: 1. Left heart catheterization. 2. Saphenous vein graft angiography of the graft to the right coronary. 3. Percutaneous coronary intervention on the native right coronary with    multiple balloon angioplasty procedures. 4. Stent implantation.  (This procedure was a very long procedure and very complicated due to the multiple balloons and multiple inflations required including stent implantation.)  CARDIOLOGIST:  Darci Needle, M.D.  DESCRIPTION OF PROCEDURE:  After informed consent the patient was brought to the cath lab where a 7 French arterial sheath was placed in the right femoral artery.  Three-thousand-five-hundred units of IV heparin was administered and subsequently a double bolus followed by an infusion of integrelin was begun. The initial ACTs were in the 320-second range.  A Medtronic 7 [place] guide catheter was used.  A BMW wire was used.  Angioplasty was initially attempted with a 3.0 mm diameter, 15 mm long cutting balloon.  This would not track down the vessel.  We then predilated with a 2.5 mm Maverick balloon, 20 mm  in diameter from the distal vessel to the proximal portion of the midvessel.  We then again attempted to use the cutting balloon and was able to pass this cutting balloon into the proximal portion of the stent placed in 1996.  One balloon inflation was performed.  We then attempted to further advance this cutting balloon, but it would not track down the vessel.   We then removed this balloon and used a 6 mm, 3.0 diameter cutting balloon.  This balloon easily went to the distal portion of the artery where we began balloon angioplasty from distal to the midvessel.  Multple balloon inflations were performed totalling eight balloon inflations in different spots as we tracked back to the more proximal portion of the vessel.  We then placed a zipper, MXS-7, 3.0, 24 mm long stent distally and deployed to 14 atmospheres.  We then placed a proximal Zada 23 mm long 3.0 stent.  We had some difficulty getting this stent to track into the previously placed mid stent from 1996.  After some difficulty this stent was deployed to a peak pressure of 14 atmospheres.  We then pulse dilated.  We attempted to pass a 3.25 mm PowerSeal balloon, but it would not enter the mid stented region.  We then used a Maverick, which also had difficulty, but we partially inflated the balloon to 0.5 atmospheres and it was able to enter the stented region.  We then performed three inflations.  The first inflation was proximally to 10 atmospheres.  The second inflation was distally to 12 atmospheres and the third inflation was performed in the midvessel to  14 atmospheres using this 3.25 mm balloon.  At these pressures the effective diameter was 3.4-3.6 mm.  Postprocedure we achieved an excellent angiographic result with brisk antegrade flow.  The ACT postprocedure was 232 seconds.  Total time for this procedure was approximately 2.5 hours.  CONCLUSIONS: 1. Successful complex angioplasty on the native right coronary with     essentially endovascular reconstruction with stents now in place from the    posterior descending artery to the distal portion of the proximal    vessel. 2. Stenoses of 90+% were reduced to 0% in all regions involved.  PLAN: 1. Plavix times six to eight weeks. 2. Continue aspirin. 3. Integrelin times 18 hours. 4. Hope for discharge May 22, 2001. Dictated by:   Darci Needle, M.D. Attending Physician:  Lyn Records. Iii DD:  05/21/01 TD:  05/21/01 Job: [id number]

## 2010-10-26 NOTE — Op Note (Signed)
[place]. [facility]  Patient:    Christian Mccall, Christian Mccall Visit Number: [id number] MRN: [id number]          Service Type: MED Location: [id number] [id number] Attending Physician:  Mikey Bussing Dictated by:   Mikey Bussing, M.D. Proc. Date: 02/18/01 Admit Date:  02/16/2001   CC:         Darci Needle, M.D.   Operative Report  PREOPERATIVE DIAGNOSIS:  Class IV unstable angina with severe three-vessel coronary disease.  POSTOPERATIVE DIAGNOSIS:  Class IV unstable angina with severe three-vessel coronary disease.  OPERATION PERFORMED:  Coronary artery bypass grafting x 4 (left internal mammary artery to left anterior descending, saphenous vein graft to diagonal, saphenous vein graft to obtuse marginal 1, saphenous vein graft to posterior descending coronary artery).  SURGEON:  Mikey Bussing, M.D.  ASSISTANT:  Dominica Severin, P.A.  ANESTHESIA:  General.  ANESTHESIOLOGIST:  Dr. Judie Petit.  INDICATIONS FOR PROCEDURE:  The patient is a 55 year old white male smoker with positive family history of coronary disease who presents with unstable angina.  He had a coronary stent placed in 1996 by Dr. Katrinka Blazing.  Repeat cath demonstrated severe three-vessel disease with preserved left ventricular function and he was referred for surgical revascularization.  Prior to the operation, I reviewed the patients cath with the patient and family in his hospital room.  I reviewed the results of the cardiac evaluation including the indications and expected benefits of coronary artery bypass surgery.  I reviewed the alternatives to surgical therapy for his coronary artery disease and the expected outcome of those alternative therapies.  I reviewed the major aspects of the recommended operation including the location of the surgical incision, the choice of conduit, the use of general anesthesia and cardiopulmonary bypass and the expected postoperative recovery  period. I reviewed the risks of the operation with the patient and family including the risks of MI, CVA, bleeding, infection, pneumonia, respiratory insufficiency and ventilator dependence and death.  He understood these implications for the surgery and agreed to proceed with the operation as planned under informed consent.  OPERATIVE FINDINGS:  The coronaries had proximal plaque.  The vein was of average quality.  The mammary artery was small but had excellent flow. Ventricular function in the myocardium was fairly normal.  DESCRIPTION OF PROCEDURE:  The patient was brought to the operating room and placed supine on the operating table where general anesthesia was induced under invasive hemodynamic monitoring.  The chest, abdomen and legs were prepped with Betadine and draped as a sterile field.  A median sternotomy was performed as the saphenous vein was harvested from the right leg.  The internal mammary artery was harvested as a pedicle graft from its origin at the subclavian vessels.  The heparin was administered and the ACT was documented as being therapeutic.  Through pursestrings placed in the ascending aorta and right atrium, the patient was cannulated and placed on bypass and cooled to 32 degrees.  The coronaries were identified.  The mammary artery and vein grafts were prepared for the distal anastomoses.  A cardioplegia cannula was placed and the patient was cooled to 28 degrees.  As the aortic crossclamp was applied, 500 cc of cold blood cardioplegia was delivered to the aortic root with immediate cardioplegic arrest and septal temperature dropping to less than 12 degrees.  Topical iced saline slush was used to augment myocardial preservation and a pericardial insulator pad was used to protect the  left phrenic nerve.  The distal coronary anastomoses were then performed.  The first distal anastomosis was to the posterior descending.  This had a proximal 95% stenosis at the  site of the previous stent.  It had diffuse disease and the graft was placed fairly distally in the posterior descending.  A reversed saphenous vein was sewn end-to-side with running 7-0 Prolene and there was good flow through the graft.  The second distal anastomosis was to the diagonal.  This was a 1.5 mm vessel with proximal 90% stenosis.  A reversed saphenous vein was sewn end-to-side with running 7-0 Prolene and there was good flow through the graft.  The third distal anastomosis was to the OM1.  This had a proximal 70 to 80% stenosis.  A reversed saphenous vein was sewn to this 1.0 mm vessel with adequate flow through the graft.  Of note, the distal circumflex vessels appeared to be without significant disease.  The fourth distal anastomosis was to the distal third of the LAD where it became epicardial in location.  It had a proximal 90% stenosis.  The left internal mammary artery pedicle was brought through an opening created in the left lateral pericardium was brought down on the LAD and sewn end-to-side with running 8-0 Prolene.  There was excellent flow through the anastomosis.  With immediate rise in septal temperature after release of the pedicle clamp on the mammary artery.  The mammary pedicle was secured to the epicardium and the aortic crossclamp was removed.  The heart resumed a spontaneous rhythm.  Three proximal vein anastomoses were placed on the ascending aorta using a partial occlusion clamp.  The grafts were then perfused and each had good flow and hemostasis was documented at the proximal and distal sites.  The patient was rewarmed and reperfused. Temporary pacing wires were applied.  The lungs were re-expanded and the ventilator was resumed.  The patient was weaned from bypass without difficulty.  Protamine was administered and the cannulas were removed.  The mediastinum was irrigated with warm antibiotic irrigation.  The leg incision was irrigated and closed in a  standard fashion.  The pericardial fat was  closed over the aorta and right ventricle.  Two mediastinal and left pleural chest tube were placed and brought out through separate incisions.  The sternum was reapproximated with interrupted steel wire.  The pectoralis fascia was closed with interrupted #1 Vicryl.  The subcutaneous and skin were closed with running Vicryl and sterile dressings were applied. Total bypass time was 130 minutes with aortic crossclamp time of 60 minutes. Dictated by:   Mikey Bussing, M.D. Attending Physician:  Mikey Bussing DD:  02/18/01 TD:  02/18/01 Job: [id number]

## 2010-11-22 ENCOUNTER — Other Ambulatory Visit (INDEPENDENT_AMBULATORY_CARE_PROVIDER_SITE_OTHER): Payer: Medicare PPO

## 2010-11-22 DIAGNOSIS — I714 Abdominal aortic aneurysm, without rupture: Secondary | ICD-10-CM

## 2010-11-30 NOTE — Procedures (Unsigned)
DUPLEX ULTRASOUND OF ABDOMINAL AORTA  INDICATION:  Abdominal aortic aneurysm.  HISTORY: Diabetes:  Yes. Cardiac:  PTCA. Hypertension:  Yes. Smoking:  Previous. Connective Tissue Disorder: Family History:  Patient's mother, maternal aunt, and maternal uncles had aneurysms. Previous Surgery:  No.  DUPLEX EXAM:         AP (cm)                   TRANSVERSE (cm) Proximal             2.8 cm                    2.8 cm Mid                  2.5 cm                    2.3 cm Distal               5.2 cm                    5.0 cm Right Iliac          1.5 cm                    1.7 cm Left Iliac           1.5 cm                    1.5 cm  PREVIOUS:  Date: 04/26/2010  AP:  4.71  TRANSVERSE:  4.83  IMPRESSION: 1. Aneurysmal dilatation of the mid to distal abdominal aorta with no     significant change in maximum diameter when compared to the     previous examination. 2. Limited visualization of the abdominal aorta noted due to overlying     bowel gas patterns and patient body habitus.  ___________________________________________ Janetta Hora. Fields, MD  CH/MEDQ  D:  11/22/2010  T:  11/22/2010  Job:  [id number]

## 2010-12-04 ENCOUNTER — Encounter: Payer: Self-pay | Admitting: Vascular Surgery

## 2010-12-06 ENCOUNTER — Other Ambulatory Visit: Payer: Self-pay | Admitting: Vascular Surgery

## 2010-12-06 ENCOUNTER — Encounter: Payer: Self-pay | Admitting: Vascular Surgery

## 2010-12-06 ENCOUNTER — Ambulatory Visit (INDEPENDENT_AMBULATORY_CARE_PROVIDER_SITE_OTHER): Payer: Medicare PPO | Admitting: Vascular Surgery

## 2010-12-06 VITALS — BP 137/93 | HR 67

## 2010-12-06 DIAGNOSIS — I714 Abdominal aortic aneurysm, without rupture: Secondary | ICD-10-CM

## 2010-12-06 DIAGNOSIS — R0602 Shortness of breath: Secondary | ICD-10-CM

## 2010-12-06 HISTORY — DX: Shortness of breath: R06.02

## 2010-12-06 NOTE — Progress Notes (Signed)
Increasing size of AAA per Vasc. Lab done 11-22-10 here.

## 2010-12-06 NOTE — Progress Notes (Signed)
Patient is a 55 year old male who returns today for evaluation of abdominal aortic aneurysm. The patient was last seen in May of 2011. His aneurysm is 4.8 cm in diameter in May of 2010. He has chronic abdominal and back pain. He has had no change in the character of his pain recently.  Chronic medical problems continued to remain hypertension coronary artery disease elevated cholesterol and mild to moderate COPD. These are currently controlled. However the patient develops significant dyspnea with minimal exertion these medical problems are followed by Dr. Timothy Lasso and Dr. Garnette Scheuermann.  Review of systems  Patient has pain in both of his legs when walking. He also has a prior history of stroke and mini stroke in the remote past  Cardiac he has occasional chest pain and shortness of breath  GI he has reflux  Neurologic he has occasional dizziness and headaches  Musculoskeletal has multiple joint arthritis  Urinary he has some difficulty urinating  Physical exam:  Vital signs: Blood pressure 137/93 in the left arm oxygen saturation 97% on room air heart rate 67 and regular  HEENT: Unremarkable  Chest: Clear to auscultation  Cardiac: Regular rate and rhythm  Abdomen: Soft nontender nondistended no palpable pulsatile mass  Musculoskeletal: No major joint deformities  Neuro: Symmetric Ru motor strength which is fiber of 5  Skin no ulcers or rashes  Peripheral vascular: Radial arteries 2+, left femoral 2+, right femoral 1+, right dorsalis pedis 1+, dorsalis pedis bilaterally 2+, left dorsalis pedis absent  I reviewed the patient's abdominal aortic ultrasound dated 11/22/2010 shows his aortic diameter is now 5.2 cm  Impression enlarging abdominal aortic aneurysm  Plan: We will obtain a CT angiogram of the abdomen and pelvis and the patient will followup to review these findings in 2 weeks. If the CT confirms his ultrasound. We will consider operative repair. Operative repair with open  or stent graft techniques were described with the patient and his wife today.

## 2010-12-20 ENCOUNTER — Encounter (HOSPITAL_COMMUNITY): Payer: Self-pay

## 2010-12-20 ENCOUNTER — Ambulatory Visit (HOSPITAL_COMMUNITY)
Admission: RE | Admit: 2010-12-20 | Discharge: 2010-12-20 | Disposition: A | Discharge status: Home / Self Care | Payer: Medicare PPO | Source: Ambulatory Visit | Attending: Vascular Surgery | Admitting: Vascular Surgery

## 2010-12-20 DIAGNOSIS — I714 Abdominal aortic aneurysm, without rupture, unspecified: Secondary | ICD-10-CM | POA: Insufficient documentation

## 2010-12-20 DIAGNOSIS — Z09 Encounter for follow-up examination after completed treatment for conditions other than malignant neoplasm: Secondary | ICD-10-CM | POA: Insufficient documentation

## 2010-12-20 DIAGNOSIS — I701 Atherosclerosis of renal artery: Secondary | ICD-10-CM | POA: Insufficient documentation

## 2010-12-20 DIAGNOSIS — K7689 Other specified diseases of liver: Secondary | ICD-10-CM | POA: Insufficient documentation

## 2010-12-20 DIAGNOSIS — I708 Atherosclerosis of other arteries: Secondary | ICD-10-CM | POA: Insufficient documentation

## 2010-12-20 MED ORDER — IOHEXOL 300 MG/ML  SOLN
100.0000 mL | Freq: Once | INTRAMUSCULAR | Status: AC | PRN
  Administered 2010-12-20: 100 mL via INTRAVENOUS

## 2010-12-27 ENCOUNTER — Ambulatory Visit (INDEPENDENT_AMBULATORY_CARE_PROVIDER_SITE_OTHER): Payer: Medicare PPO | Admitting: Vascular Surgery

## 2010-12-27 ENCOUNTER — Encounter: Payer: Self-pay | Admitting: Vascular Surgery

## 2010-12-27 VITALS — BP 126/86 | HR 84 | Resp 16

## 2010-12-27 DIAGNOSIS — I714 Abdominal aortic aneurysm, without rupture: Secondary | ICD-10-CM

## 2010-12-27 NOTE — Progress Notes (Signed)
Patient is a 55 year old male with known abdominal aortic aneurysm. He returns today for followup after a CT angiogram of the abdomen and pelvis. He continues to deny any abdominal or back pain. He denies family history of aneurysms  Review of systems:  Respiratory: No shortness of breath with exertion  Cardiac: Denies chest pain  Physical exam:  Abdomen: Soft nontender nondistended no pulsatile mass  Extremities: No significant edema 2+ femoral pulses bilaterally  Skin: Well-healed right saphenectomy scar  Data: I reviewed the patient's CT and a gram of the abdomen and pelvis from today. He has a suitable neck for aortic stent graft. He does have some mild stenosis of the right common iliac artery but believe the delivery device should traverse this easily. He has 3 renal arteries on the left side. The aneurysm is 5 cm in diameter.  Assessment: Asymptomatic 5 cm infrarenal abdominal aortic aneurysm  Plan: Gore excluder stent graft repair in the near future after cardiology risk stratification by his cardiologist Dr. Garnette Scheuermann

## 2010-12-27 NOTE — Progress Notes (Signed)
2 wk f/u AAA; had CTA 7/12

## 2011-01-14 ENCOUNTER — Other Ambulatory Visit: Payer: Self-pay | Admitting: Vascular Surgery

## 2011-01-14 ENCOUNTER — Encounter (HOSPITAL_COMMUNITY)
Admission: RE | Admit: 2011-01-14 | Discharge: 2011-01-14 | Disposition: A | Discharge status: Home / Self Care | Payer: Medicare PPO | Source: Ambulatory Visit | Attending: Vascular Surgery | Admitting: Vascular Surgery

## 2011-01-14 ENCOUNTER — Ambulatory Visit (HOSPITAL_COMMUNITY)
Admission: RE | Admit: 2011-01-14 | Discharge: 2011-01-14 | Disposition: A | Discharge status: Home / Self Care | Payer: Medicare PPO | Source: Ambulatory Visit | Attending: Vascular Surgery | Admitting: Vascular Surgery

## 2011-01-14 DIAGNOSIS — I714 Abdominal aortic aneurysm, without rupture, unspecified: Secondary | ICD-10-CM | POA: Insufficient documentation

## 2011-01-14 DIAGNOSIS — I10 Essential (primary) hypertension: Secondary | ICD-10-CM | POA: Insufficient documentation

## 2011-01-14 DIAGNOSIS — J438 Other emphysema: Secondary | ICD-10-CM | POA: Insufficient documentation

## 2011-01-14 DIAGNOSIS — Z01811 Encounter for preprocedural respiratory examination: Secondary | ICD-10-CM | POA: Insufficient documentation

## 2011-01-14 DIAGNOSIS — E119 Type 2 diabetes mellitus without complications: Secondary | ICD-10-CM | POA: Insufficient documentation

## 2011-01-14 DIAGNOSIS — Z01812 Encounter for preprocedural laboratory examination: Secondary | ICD-10-CM | POA: Insufficient documentation

## 2011-01-14 DIAGNOSIS — Z01810 Encounter for preprocedural cardiovascular examination: Secondary | ICD-10-CM | POA: Insufficient documentation

## 2011-01-14 LAB — COMPREHENSIVE METABOLIC PANEL
ALT: 29 U/L (ref 0–53)
Alkaline Phosphatase: 41 U/L (ref 39–117)
CO2: 24 mEq/L (ref 19–32)
Chloride: 103 mEq/L (ref 96–112)
GFR calc Af Amer: >60 mL/min (ref >60)
GFR calc non Af Amer: >60 mL/min (ref >60)
Glucose, Bld: 159 mg/dL — ABNORMAL HIGH (ref 70–99)
Potassium: 4.1 mEq/L (ref 3.5–5.1)
Sodium: 136 mEq/L (ref 135–145)
Total Protein: 6.6 g/dL (ref 6.0–8.3)

## 2011-01-14 LAB — URINALYSIS, ROUTINE W REFLEX MICROSCOPIC
Bilirubin Urine: NEGATIVE
Glucose, UA: >1000 mg/dL — AB
Hgb urine dipstick: NEGATIVE
Ketones, ur: NEGATIVE mg/dL
pH: 6 (ref 5.0–8.0)

## 2011-01-14 LAB — BLOOD GAS, ARTERIAL
Bicarbonate: 24 mEq/L (ref 20.0–24.0)
TCO2: 25.1 mmol/L (ref 0–100)
pCO2 arterial: 37.2 mmHg (ref 35.0–45.0)
pH, Arterial: 7.425 (ref 7.350–7.450)

## 2011-01-14 LAB — SURGICAL PCR SCREEN: MRSA, PCR: NEGATIVE

## 2011-01-14 LAB — CBC
MCH: 31 pg (ref 26.0–34.0)
MCV: 89.7 fL (ref 78.0–100.0)
Platelets: 100 10*3/uL — ABNORMAL LOW (ref 150–400)
RDW: 12.3 % (ref 11.5–15.5)

## 2011-01-16 ENCOUNTER — Inpatient Hospital Stay (HOSPITAL_COMMUNITY): Payer: Medicare PPO

## 2011-01-16 ENCOUNTER — Inpatient Hospital Stay (HOSPITAL_COMMUNITY)
Admission: RE | Admit: 2011-01-16 | Discharge: 2011-01-17 | DRG: 238 | Disposition: A | Discharge status: Home / Self Care | Payer: Medicare PPO | Source: Ambulatory Visit | Attending: Vascular Surgery | Admitting: Vascular Surgery

## 2011-01-16 DIAGNOSIS — I714 Abdominal aortic aneurysm, without rupture, unspecified: Principal | ICD-10-CM | POA: Diagnosis present

## 2011-01-16 DIAGNOSIS — J4489 Other specified chronic obstructive pulmonary disease: Secondary | ICD-10-CM | POA: Diagnosis present

## 2011-01-16 DIAGNOSIS — E119 Type 2 diabetes mellitus without complications: Secondary | ICD-10-CM | POA: Diagnosis present

## 2011-01-16 DIAGNOSIS — D62 Acute posthemorrhagic anemia: Secondary | ICD-10-CM | POA: Diagnosis not present

## 2011-01-16 DIAGNOSIS — J449 Chronic obstructive pulmonary disease, unspecified: Secondary | ICD-10-CM | POA: Diagnosis present

## 2011-01-16 DIAGNOSIS — K219 Gastro-esophageal reflux disease without esophagitis: Secondary | ICD-10-CM | POA: Diagnosis present

## 2011-01-16 DIAGNOSIS — Z8673 Personal history of transient ischemic attack (TIA), and cerebral infarction without residual deficits: Secondary | ICD-10-CM

## 2011-01-16 DIAGNOSIS — I2510 Atherosclerotic heart disease of native coronary artery without angina pectoris: Secondary | ICD-10-CM | POA: Diagnosis present

## 2011-01-16 DIAGNOSIS — I252 Old myocardial infarction: Secondary | ICD-10-CM

## 2011-01-16 DIAGNOSIS — I739 Peripheral vascular disease, unspecified: Secondary | ICD-10-CM | POA: Diagnosis present

## 2011-01-16 DIAGNOSIS — Z951 Presence of aortocoronary bypass graft: Secondary | ICD-10-CM

## 2011-01-16 HISTORY — PX: ABDOMINAL AORTIC ANEURYSM REPAIR: SHX42

## 2011-01-16 LAB — BASIC METABOLIC PANEL
BUN: 12 mg/dL (ref 6–23)
CO2: 27 mEq/L (ref 19–32)
Chloride: 102 mEq/L (ref 96–112)
Creatinine, Ser: 0.92 mg/dL (ref 0.50–1.35)
Glucose, Bld: 147 mg/dL — ABNORMAL HIGH (ref 70–99)

## 2011-01-16 LAB — GLUCOSE, CAPILLARY: Glucose-Capillary: 121 mg/dL — ABNORMAL HIGH (ref 70–99)

## 2011-01-16 LAB — PROTIME-INR: INR: 1.2 (ref 0.00–1.49)

## 2011-01-16 LAB — CBC
HCT: 33.4 % — ABNORMAL LOW (ref 39.0–52.0)
MCHC: 35.3 g/dL (ref 30.0–36.0)
RDW: 12.5 % (ref 11.5–15.5)

## 2011-01-17 LAB — GLUCOSE, CAPILLARY

## 2011-01-17 LAB — BASIC METABOLIC PANEL
CO2: 24 mEq/L (ref 19–32)
Glucose, Bld: 167 mg/dL — ABNORMAL HIGH (ref 70–99)
Potassium: 3.9 mEq/L (ref 3.5–5.1)
Sodium: 134 mEq/L — ABNORMAL LOW (ref 135–145)

## 2011-01-17 LAB — POCT I-STAT 4, (NA,K, GLUC, HGB,HCT)
Glucose, Bld: 87 mg/dL (ref 70–99)
Potassium: 4.3 mEq/L (ref 3.5–5.1)
Sodium: 137 mEq/L (ref 135–145)

## 2011-01-17 LAB — CBC
Hemoglobin: 11.9 g/dL — ABNORMAL LOW (ref 13.0–17.0)
RBC: 3.71 MIL/uL — ABNORMAL LOW (ref 4.22–5.81)
WBC: 9.4 10*3/uL (ref 4.0–10.5)

## 2011-01-21 ENCOUNTER — Encounter: Payer: Self-pay | Admitting: Physician Assistant

## 2011-01-23 ENCOUNTER — Emergency Department (HOSPITAL_COMMUNITY): Payer: Medicare PPO

## 2011-01-23 ENCOUNTER — Inpatient Hospital Stay (HOSPITAL_COMMUNITY)
Admission: EM | Admit: 2011-01-23 | Discharge: 2011-01-25 | DRG: 373 | Disposition: A | Discharge status: Home / Self Care | Payer: Medicare PPO | Attending: Family Medicine | Admitting: Family Medicine

## 2011-01-23 DIAGNOSIS — Z9861 Coronary angioplasty status: Secondary | ICD-10-CM

## 2011-01-23 DIAGNOSIS — I10 Essential (primary) hypertension: Secondary | ICD-10-CM | POA: Diagnosis present

## 2011-01-23 DIAGNOSIS — Z87891 Personal history of nicotine dependence: Secondary | ICD-10-CM

## 2011-01-23 DIAGNOSIS — Z8673 Personal history of transient ischemic attack (TIA), and cerebral infarction without residual deficits: Secondary | ICD-10-CM

## 2011-01-23 DIAGNOSIS — Z7982 Long term (current) use of aspirin: Secondary | ICD-10-CM

## 2011-01-23 DIAGNOSIS — E119 Type 2 diabetes mellitus without complications: Secondary | ICD-10-CM | POA: Diagnosis present

## 2011-01-23 DIAGNOSIS — E785 Hyperlipidemia, unspecified: Secondary | ICD-10-CM | POA: Diagnosis present

## 2011-01-23 DIAGNOSIS — A0472 Enterocolitis due to Clostridium difficile, not specified as recurrent: Principal | ICD-10-CM | POA: Diagnosis present

## 2011-01-23 DIAGNOSIS — Z951 Presence of aortocoronary bypass graft: Secondary | ICD-10-CM

## 2011-01-23 DIAGNOSIS — Z7902 Long term (current) use of antithrombotics/antiplatelets: Secondary | ICD-10-CM

## 2011-01-23 DIAGNOSIS — Z79899 Other long term (current) drug therapy: Secondary | ICD-10-CM

## 2011-01-23 DIAGNOSIS — I2510 Atherosclerotic heart disease of native coronary artery without angina pectoris: Secondary | ICD-10-CM | POA: Diagnosis present

## 2011-01-23 LAB — COMPREHENSIVE METABOLIC PANEL
ALT: 22 U/L (ref 0–53)
AST: 27 U/L (ref 0–37)
CO2: 25 mEq/L (ref 19–32)
Chloride: 102 mEq/L (ref 96–112)
Creatinine, Ser: 0.96 mg/dL (ref 0.50–1.35)
GFR calc non Af Amer: >60 mL/min (ref >60)
Total Bilirubin: 0.4 mg/dL (ref 0.3–1.2)

## 2011-01-23 LAB — CBC
HCT: 36 % — ABNORMAL LOW (ref 39.0–52.0)
MCV: 91.4 fL (ref 78.0–100.0)
RDW: 12.7 % (ref 11.5–15.5)
WBC: 9.7 10*3/uL (ref 4.0–10.5)

## 2011-01-23 LAB — DIFFERENTIAL
Eosinophils Relative: 3 % (ref 0–5)
Lymphocytes Relative: 33 % (ref 12–46)
Lymphs Abs: 3.2 10*3/uL (ref 0.7–4.0)
Monocytes Absolute: 0.7 10*3/uL (ref 0.1–1.0)
Monocytes Relative: 7 % (ref 3–12)

## 2011-01-23 LAB — OCCULT BLOOD, POC DEVICE: Fecal Occult Bld: POSITIVE

## 2011-01-23 LAB — LACTIC ACID, PLASMA: Lactic Acid, Venous: 2.9 mmol/L — ABNORMAL HIGH (ref 0.5–2.2)

## 2011-01-23 MED ORDER — IOHEXOL 350 MG/ML SOLN
100.0000 mL | Freq: Once | INTRAVENOUS | Status: AC | PRN
  Administered 2011-01-23: 100 mL via INTRAVENOUS

## 2011-01-24 ENCOUNTER — Ambulatory Visit: Payer: Medicare PPO

## 2011-01-24 LAB — GLUCOSE, CAPILLARY
Glucose-Capillary: 101 mg/dL — ABNORMAL HIGH (ref 70–99)
Glucose-Capillary: 124 mg/dL — ABNORMAL HIGH (ref 70–99)
Glucose-Capillary: 118 mg/dL — ABNORMAL HIGH (ref 70–99)

## 2011-01-24 LAB — CBC
HCT: 32.2 % — ABNORMAL LOW (ref 39.0–52.0)
HCT: 32.9 % — ABNORMAL LOW (ref 39.0–52.0)
Hemoglobin: 11.3 g/dL — ABNORMAL LOW (ref 13.0–17.0)
Hemoglobin: 11.4 g/dL — ABNORMAL LOW (ref 13.0–17.0)
MCH: 32.3 pg (ref 26.0–34.0)
MCHC: 35.1 g/dL (ref 30.0–36.0)
MCV: 91.1 fL (ref 78.0–100.0)
RBC: 3.61 MIL/uL — ABNORMAL LOW (ref 4.22–5.81)
RDW: 12.8 % (ref 11.5–15.5)
WBC: 7.3 10*3/uL (ref 4.0–10.5)

## 2011-01-24 LAB — COMPREHENSIVE METABOLIC PANEL
AST: 21 U/L (ref 0–37)
Albumin: 3.2 g/dL — ABNORMAL LOW (ref 3.5–5.2)
Alkaline Phosphatase: 39 U/L (ref 39–117)
BUN: 11 mg/dL (ref 6–23)
CO2: 26 mEq/L (ref 19–32)
Chloride: 104 mEq/L (ref 96–112)
Creatinine, Ser: 1.01 mg/dL (ref 0.50–1.35)
GFR calc non Af Amer: >60 mL/min (ref >60)
Potassium: 3.8 mEq/L (ref 3.5–5.1)
Total Bilirubin: 0.5 mg/dL (ref 0.3–1.2)

## 2011-01-24 LAB — CK TOTAL AND CKMB (NOT AT ARMC)
CK, MB: 1.6 ng/mL (ref 0.3–4.0)
CK, MB: 1.5 ng/mL (ref 0.3–4.0)
Relative Index: INVALID (ref 0.0–2.5)
Relative Index: INVALID (ref 0.0–2.5)

## 2011-01-24 LAB — DIFFERENTIAL
Lymphocytes Relative: 32 % (ref 12–46)
Lymphs Abs: 2.7 10*3/uL (ref 0.7–4.0)
Monocytes Absolute: 0.8 10*3/uL (ref 0.1–1.0)
Monocytes Relative: 9 % (ref 3–12)
Neutro Abs: 4.7 10*3/uL (ref 1.7–7.7)

## 2011-01-24 LAB — PREPARE RBC (CROSSMATCH)

## 2011-01-24 LAB — MAGNESIUM: Magnesium: 2.1 mg/dL (ref 1.5–2.5)

## 2011-01-24 LAB — CLOSTRIDIUM DIFFICILE BY PCR: Toxigenic C. Difficile by PCR: POSITIVE — AB

## 2011-01-25 LAB — GLUCOSE, CAPILLARY
Glucose-Capillary: 88 mg/dL (ref 70–99)
Glucose-Capillary: 100 mg/dL — ABNORMAL HIGH (ref 70–99)

## 2011-01-25 LAB — BASIC METABOLIC PANEL
CO2: 23 mEq/L (ref 19–32)
Chloride: 104 mEq/L (ref 96–112)
GFR calc Af Amer: >60 mL/min (ref >60)
Potassium: 3.6 mEq/L (ref 3.5–5.1)
Sodium: 138 mEq/L (ref 135–145)

## 2011-01-25 LAB — GIARDIA/CRYPTOSPORIDIUM SCREEN(EIA): Giardia Screen - EIA: NEGATIVE

## 2011-01-25 LAB — CBC
Platelets: 145 10*3/uL — ABNORMAL LOW (ref 150–400)
RBC: 3.56 MIL/uL — ABNORMAL LOW (ref 4.22–5.81)
RDW: 12.5 % (ref 11.5–15.5)
WBC: 5.7 10*3/uL (ref 4.0–10.5)

## 2011-01-25 NOTE — Discharge Summary (Signed)
NAME:  Christian Mccall, Christian Mccall NO.:  [id number]  MEDICAL RECORD NO.:  [id number]  LOCATION:  2604                         FACILITY:  [facility]  PHYSICIAN:  Jeoffrey Massed, MD    DATE OF BIRTH:  1955/06/27  DATE OF ADMISSION:  01/23/2011 DATE OF DISCHARGE:  01/25/2011                        DISCHARGE SUMMARY - REFERRING   PRIMARY CARE PRACTITIONER:  Dr. Timothy Lasso.  PRIMARY GASTROENTEROLOGIST:  Dr. Dulce Sellar.  VASCULAR SURGEON:  Dr. Darrick Penna.  PRIMARY CARDIOLOGIST:  Dr. Verdis Prime.  PRIMARY DISCHARGE DIAGNOSES: 1. C diff colitis. 2. Questionable upper gastrointestinal bleed with workup negative and     hemoglobin stable.  SECONDARY DISCHARGE DIAGNOSES/PAST MEDICAL HISTORY:  Significant for: 1. Recent Gore Excluder stent graft aneurysm repair for     infrarenal abdominal aortic aneurysm. 2. History of coronary artery disease, status post CABG and remote     PTCA in the past. 3. History of cerebrovascular accident and transient ischemic attacks. 4. History of type 2 diabetes. 5. History of hypertension. 6. History of dyslipidemia.  DISCHARGE MEDICATIONS:  Include the following: 1. Florastor 500 mg 1 tablet twice daily. 2. Flagyl 500 mg p.o. every 8 hours for another 9 more days. 3. Amaryl 4 mg 1 tablet p.o. daily at bedtime. 4. Aspirin 325 mg 1 tablet p.o. daily. 5. Clonazepam 1 mg p.o. Sunday to Thursdays at nights. 6. Cozaar 50 mg 1 tablet daily. 7. Fenofibrate 160 mg 1 tablet p.o. daily. 8. Lasix 40 mg 1 tablet p.o. every other day. 9. Lopressor 50 mg 1 tablet twice daily. 10.Lyrica 100 mg 1 capsule twice daily. 11.Metformin 1000 mg 1 tablet in the morning and half a tablet in the     evening. 12.Nitroglycerin 0.6 mg 1 patch transdermally daily. 13.Nitroglycerin 0.4 mg 1 tablet under the tongue every 5 minutes up     to 3 doses p.r.n. 14.Percocet 5/325 one to two tablets every 4 hours p.r.n. 15.Plavix 75 mg 1 tablet daily. 16.Ranexa 1000 mg 1 tablet twice  daily. 17.Spiriva 18 mcg 1 capsule p.o. daily p.r.n. 18.Terazosin 1 mg 1 tablet p.o. daily. 19.Trazodone 150 mg 1 tablet p.o. on Fridays and Saturday nights. 20.Albuterol inhaler 2 puffs inhaled daily. 21.Vitamin B12 injection one injection intramuscularly every 3 months. 22.Vitamin D3 1000 units 1 tablet p.o. daily. 23.Vytorin 10/40 one tablet p.o. daily. 24.Pepcid 20 mg p.o. daily in the clinic today.  CONSULTANTS ON THE CASE: 1. Dr. Randa Evens from the [facility]. 2. [facility], Dr. Darrick Penna.  BRIEF HISTORY OF PRESENT ILLNESS:  The patient is a very pleasant 55- year-old white male with recent history of endovascular abdominal aneurysm repair that presented to the ED with questionable black stools. Retrospectively upon taking the history, the patient had 3-4 episodes of loose stools that were just quite watery on the day of admission.  There was a question of whether he had an upper GI bleed and was then admitted to the hospitalist service.  Since he had a recent endovascular aortic aneurysm repair, the patient underwent a CT angiogram of the abdomen and the chest which did not show any endograft leak.  He was then admitted to the hospitalist service for further evaluation and treatment.  For further details, please see the history and physical that was dictated by Dr. Toniann Fail on admission.  PROCEDURES PERFORMED:  The patient underwent an upper GI endoscopy done by Dr. Randa Evens which showed no gross bleeding or any other significant lesions on upper endoscopy.  PERTINENT LABORATORY DATA: 1. Stool PCR for C diff was positive. 2. Stool cultures done on January 24, 2011, is negative so far. 3. Discharge hemoglobin is 11.3 and the admitting hemoglobin was 12.7.  PERTINENT RADIOLOGICAL STUDIES: 1. CT angiogram of the chest showed negative for thoracic aortic     dissection or aneurysm.  No acute findings of the chest, prior     median sternotomy. 2. CT angiogram of the abdomen  and pelvis showed endograft repair of     infrarenal abdominal aortic aneurysm.  No evidence of endograft     leak.  Native abdominal aneurysm sac is stable in size.  Fatty     infiltration of the liver.  BRIEF HOSPITAL COURSE: 1. Questionable gastrointestinal bleed.  The patient presented with     some black stools that were loose as well.  On evaluation in the     ED, he did have fecal occult blood that was positive.  Given his     recent history of an endograft abdominal aortic aneurysm repair, he     did undergo a CT angiogram of the chest and abdomen which did not     show any leak from his aorta and subsequently then was admitted to     the hospitalist service.  He was evaluated by Dr. Randa Evens from     [facility] and taken for in the upper GI endoscopy which was     essentially negative.  The patient was also placed on a proton pump     inhibitor in the form of a continuous infusion that was then     changed to b.i.d. after the upper endoscopy was negative.  He did     get frequent H and H checks which were completely stable.  He has     had no further black stools and actually for the past 2 days has     had green stools which are still loose.  The patient has had no     bowel movements today and he had only 2 bowel movements yesterday.     It is felt at this time that the questionable black stools that the     patient had were actually from perhaps developing C diff colitis     and not from an upper gastrointestinal bleed. 2. C diff colitis.  The patient has had a recent procedure done to     repair his abdominal aortic aneurysm and may have gotten     antibiotics prior to the procedure and perhaps may have placed him     at risk for having C diff colitis.  The patient is also on proton     pump inhibitor therapy prior to this admission and perhaps that     played a role as well.  In any event the since the admitting     diagnosis was not very clear, the admitting physician did  send off     a stool for C diff PCR that subsequently came back positive     yesterday.  The patient does not have fever, does not have     abdominal pain.  Once his C diff PCR came back positive, the  patient was placed on Flagyl.  He has had only 2 bowel movements     yesterday and they are not watery but just loose.  He has had no     bowel movements today.  He appears clinically euvolemic.  It is     felt that this patient is stable to be discharged home on p.o.     Flagyl along with Florastor.  I did speak with Dr. Randa Evens today     and he has cleared the patient to be discharged as well. 3. Recent abdominal aorta aortic aneurysm repair.  This was done by     Dr. Darrick Penna on August 08 and the procedure actually was a Gore     Excluder stent graft aneurysm repair.  Dr. Darrick Penna did follow this     patient on 8/17 and felt that these issues were stable.  Also     please note that the patient did have a CT angiogram of his abdomen     that did not show any leakage from his graft site as well.  Rest of his medical issues were stable.  DISPOSITION:  It is felt that this patient is stable to be discharged home today.  FOLLOWUP INSTRUCTIONS: 1. The patient to follow up with Dr. Timothy Lasso in the next 1-2 weeks.  He     is to call and make an appointment. 2. The patient to follow up with Dr. Dulce Sellar from [facility] in the next     2-3 weeks.  He is to call and make an appointment. 3. The patient is to follow up with Dr. Darrick [facility]na [facility]     at his next scheduled appointment.  Total time spent for discharge 45 minutes.     Jeoffrey Massed, MD     SG/MEDQ  D:  01/25/2011  T:  01/25/2011  Job:  [id number]  Electronically Signed by Jeoffrey Massed  on 01/25/2011 02:29:27 PM

## 2011-01-26 LAB — TYPE AND SCREEN
Antibody Screen: NEGATIVE
Unit division: 0

## 2011-01-27 LAB — STOOL CULTURE

## 2011-01-28 NOTE — Discharge Summary (Addendum)
NAME:  Christian Mccall, STEPTER NO.:  [id number]  MEDICAL RECORD NO.:  [id number]  LOCATION:  3312                         FACILITY:  [facility]  PHYSICIAN:  Janetta Hora. Raechel Marcos, MD  DATE OF BIRTH:  05-05-1956  DATE OF ADMISSION:  01/16/2011 DATE OF DISCHARGE:  01/17/2011                              DISCHARGE SUMMARY   CHIEF COMPLAINT:  Enlarging abdominal aortic aneurysm, which is asymptomatic.  HISTORY OF PRESENT ILLNESS:  Christian Mccall is a gentleman with known coronary artery disease, status post CABG, diabetes, diastolic heart failure, hyperlipidemia, and past history of tobacco abuse, who had a known abdominal aortic aneurysm.  On recent CAT scan, this showed that the aneurysm was enlarging and he was a candidate for an endovascular stent graft.  He otherwise was stable.  He was cleared by Cardiology and the patient was admitted for an endovascular repair of abdominal aortic aneurysm.  PAST MEDICAL HISTORY: 1. Significant for coronary artery disease with stable angina, status     post CABG. 2. Hypertension. 3. Diabetes. 4. Hyperlipidemia. 5. Asymptomatic infrarenal abdominal aortic aneurysm. 6. History of tobacco abuse.  HOSPITAL COURSE:  The patient was taken to the operating room on January 17, 2011, for an endovascular pair of abdominal aortic aneurysm by Dr. Darrick Penna.  He had 23 x 14 x 18 main body Gore Excluder graft.  He has 16 x 13.5 right iliac limb, and the right groin was closed with ProGlide percutaneous closure, the left side was an open cutdown. Postoperatively, the patient did well.  He was ambulating, voiding, and taking p.o.  He had minimal discomfort.  He had no hematoma.  All wounds are healing well.  His KUB showed good graft placement from the level of the L1 vertebral body to common iliac artery, and he was discharged to home to be followed in 4 weeks with a CTA of the abdomen and pelvis.  FINAL DIAGNOSES: 1. Infrarenal abdominal aortic aneurysm  which is asymptomatic, status     post Gore Excluder stent graft repair.  Post stent angiography     showed no type 1 or type 2 endoleak.  There was good filling of the     left and right common iliac artery. 2. Diabetes and hypertension, well controlled while he was in-house on     his normal medication regimen.  DISPOSITION:  He was discharged to home.  He will follow up with Dr. Darrick Penna in 4 weeks with a CTA of the abdomen and pelvis.  DISCHARGE MEDICATIONS: 1. Percocet 5/325 one to two tablets every 4 hours as needed for pain. 2. Glimeperide 4 mg daily at bedtime. 3. Aspirin 325 mg daily. 4. Clonazepam 1 mg Sunday and Thursday night only at bedtime. 5. Cozaar 50 mg daily. 6. Fenofibrate 160 mg daily. 7. Lasix 40 mg every other day. 8. Lopressor 50 mg twice daily. 9. Lyrica 100 mg twice daily. 10.Metformin 1000 mg 1 tablet in the morning, half tab in the evening. 11.Nitro patch 5:00 p.m. to 5:00 p.m. only daily. 12.Nitroglycerin sublingual as needed. 13.Plavix 75 mg daily. 14.Prevacid 30 mg daily. 15.Ranexa 1000 mg twice daily. 16.Spiriva 18 mcg daily as needed. 17.Terazosin 1 mg  daily. 18.Trazodone 150 mg Friday and Saturday night. 19.Insulin inhaler 2 puffs daily. 20.Vitamin D over the counter daily. 21.Vitamin B injections once every 3 months. 22.Vytorin 10/40 mg 1 tablet daily.     Christian Goo, PA-C   ______________________________ Janetta Hora Tayte Childers, MD    RR/MEDQ  D:  01/23/2011  T:  01/24/2011  Job:  [id number]  Electronically Signed by Christian Goo PA on 01/28/2011 04:02:38 PM Electronically Signed by Fabienne Bruns MD on 02/07/2011 06:27:29 PM

## 2011-01-30 ENCOUNTER — Encounter: Payer: Self-pay | Admitting: Vascular Surgery

## 2011-01-30 ENCOUNTER — Telehealth: Payer: Self-pay

## 2011-01-30 ENCOUNTER — Ambulatory Visit (INDEPENDENT_AMBULATORY_CARE_PROVIDER_SITE_OTHER): Payer: Medicare PPO | Admitting: Vascular Surgery

## 2011-01-30 VITALS — BP 118/68 | HR 79 | Resp 20 | Ht 71.0 in | Wt 236.0 lb

## 2011-01-30 DIAGNOSIS — Z9889 Other specified postprocedural states: Secondary | ICD-10-CM

## 2011-01-30 NOTE — Telephone Encounter (Addendum)
Pt's wife reports that pt. doesn't feel well.  States right groin incision is draining a "green pus".  Denies that temp checked;states he feels warm, and c/o noticing more painful to walk.  Describes open area the size of sm. fingernail.  States no redness or inflammation around right groin incision.  Discussed w/ Dr. Arbie Cookey, and advised to bring pt. in for evaluation today.  Wife advised and agrees w/ plan.  Instructed to come in to office now.

## 2011-01-30 NOTE — Progress Notes (Signed)
The patient presents today for concern regarding drainage from his right groin. He was added on the schedule complaining of drainage and concern regarding infection. He is status post stent graft repair of abdominal aortic aneurysm on 01/16/2011. He has normal 2+ femoral pulses bilaterally. He has very superficial clean area in the upper pole of his right groin incision. On tracking this with a cotton-tipped swab and does not go deep under the skin. I reassured the patient and his wife present there is no prosthetic material below this. There is no evidence of invasive infection. He will see Dr. Darrick Penna in the office in 3 weeks

## 2011-01-31 NOTE — Consult Note (Signed)
NAME:  SNYDER, COLAVITO                ACCOUNT NO.:  [id number]  MEDICAL RECORD NO.:  [id number]  LOCATION:                                 FACILITY:  PHYSICIAN:  Arietta Eisenstein L. Malon Kindle., M.D.DATE OF BIRTH:  07-08-1955  DATE OF CONSULTATION: DATE OF DISCHARGE:                                CONSULTATION   PRIMARY CARE PHYSICIAN:  Gwen Pounds, MD.  PRIMARY GASTROENTEROLOGIST:  Willis Modena, MD.  REASON FOR CONSULTATION:  GI bleed.  HISTORY:  The patient is a 55 year old gentleman, who was admitted to the hospital August 8 with abdominal aortic aneurysm that was enlarging. He was taken to the operating room on August 9 and underwent repair with an endovascular graft, was discharged home on Percocet, aspirin 325 mg, Plavix, and all of his other chronic medications.  He did fairly well, but was beginning to have darkish stools and actually had some bright blood.  He came back to the ER and his hemoglobin was stable.  His vital signs were stable.  The patient underwent CT angiogram that showed the aortic graft to be intact with no leakage, hematoma, or signs of intraperitoneal bleeding.  The patient denies any use of other nonsteroidal medications other than what he was discharged on, still is having some abdominal pain, but it is steadily getting better, and he is attributed this to his surgery last week.  The patient has had previous endoscopy and colonoscopy about a year ago done as an outpatient in our endoscopy unit by Dr. Dulce Sellar and reports that he had a couple of small polyps and thinks he may have had some hemorrhoids.  Currently, he is asymptomatic with no ongoing bleeding.  His labs revealed a hemoglobin of 12.7 yesterday down to 11.3 today with no ongoing bleeding.  MEDICATIONS ON ADMISSION:  Cozaar, fenofibrate, glyburide, Klonopin, Lasix, Lopressor, Lyrica, metformin, nitroglycerin, aspirin 325 mg, Plavix, Prevacid, Spiriva, terazosin, trazodone, Ventolin  inhaler, various vitamins as well as Vytorin.  ALLERGIES:  He has no drug allergies.  MEDICAL HISTORY:  History of colon polyps with colonoscopy and upper endoscopy about a year ago.  He has a history of CAD with previous CABG as well as stenting.  He has had a history of a stroke and TIAs.  He has a history of high cholesterol and hypertension.  He has type 2 diabetes. Recent surgery includes grafting of abdominal aortic aneurysm that was asymptomatic, but was enlarging.  FAMILY HISTORY:  Negative for colon cancer.  Positive for diabetes and cardiovascular disease.  SOCIAL HISTORY:  The patient has a history of smoking.  Drinks occasionally and now reports that he quit smoking a number of years ago. He is married.  PHYSICAL EXAMINATION:  VITAL SIGNS:  Have been stable since admission. GENERAL:  Alert and oriented white male, in no acute distress. EYES:  Sclerae nonicteric. LUNGS:  Clear. HEART:  Regular rate and rhythm.  No murmurs or gallops. ABDOMEN:  Reveals some mild tenderness throughout with excellent bowel sounds.  The tenderness is nonlocalized.  ASSESSMENT:  Gastrointestinal bleed probably upper gastrointestinal. The patient is clinically stable.  He has been on a proton pump inhibitor.  His BUN and  creatinine are normal.  Maybe that this is insignificant.  Fortunately, the CT angiogram shows a graft to be intact with no leakage.  PLAN:  We will go ahead with an elective endoscopy this morning, and if this is negative, we will probably go ahead and just treat him symptomatically.  Given his recent surgery, I would defer colonoscopy unless absolutely necessary at this time.          ______________________________ Llana Aliment Malon Kindle., M.D.     Waldron Session  D:  01/24/2011  T:  01/24/2011  Job:  [id number]  cc:   Gwen Pounds, MD Willis Modena, MD Janetta Hora Darrick Penna, MD  Electronically Signed by Carman Ching M.D. on 01/31/2011 11:38:05 AM

## 2011-01-31 NOTE — Op Note (Signed)
  NAMEMarland Kitchen  [place], [place] NO.:  [id number]  MEDICAL RECORD NO.:  [id number]  LOCATION:  2604                         FACILITY:  [facility]  PHYSICIAN:  Fayrene Fearing L. Malon Kindle., M.D.DATE OF BIRTH:  [birth year]  DATE OF PROCEDURE:  01/24/2011 DATE OF DISCHARGE:                              OPERATIVE REPORT   PROCEDURE:  Esophagogastroduodenoscopy.  MEDICATIONS: 1. Cetacaine spray. 2. Fentanyl 50 mcg. 3. Versed 5 mg IV.  INDICATIONS:  A very nice gentleman who had a recent abdominal aortic aneurysm repair was readmitted with some GI bleeding.  Angiogram last night showed no leakage of the graft by CT angiogram.  The patient's hemoglobin is stable.  This procedure is done to look for an ulcer.  DESCRIPTION OF PROCEDURE:  Procedure had been explained to the patient, consent obtained.  Left lateral decubitus position.  Pentax upper scope inserted blindly in the esophagus and advanced.  The esophagus was completely endoscopically normal.  The stomach was entered.  There was no active bleeding.  No ulceration, inflammation, gastritis, etc. Gastric outlet identified and passed the duodenum down in the second portion was normal.  No ulceration, inflammation, or signs of recent bleeding.  The scope was withdrawn.  Initial findings were confirmed.  ASSESSMENT:  Positive stool with no gross bleeding on upper endoscopy.  PLAN:  We will follow clinically.  The patient had a colonoscopy last year by Dr. Dulce Sellar.  With his recent abdominal surgery, I am hesitant to do another colonoscopy unless he is bleeding heavily.  We will try to give him some MiraLax to clear out his colon and follow his stools.          ______________________________ Llana Aliment Malon Kindle., M.D.     Waldron Session  D:  01/24/2011  T:  01/24/2011  Job:  [id number]  cc:   Gwen Pounds, MD Janetta Hora Darrick Penna, MD Willis Modena, MD  Electronically Signed by Carman Ching M.D. on 01/31/2011 11:38:11 AM

## 2011-02-06 ENCOUNTER — Ambulatory Visit: Payer: Medicare PPO

## 2011-02-07 ENCOUNTER — Other Ambulatory Visit: Payer: Self-pay | Admitting: Internal Medicine

## 2011-02-07 DIAGNOSIS — N644 Mastodynia: Secondary | ICD-10-CM

## 2011-02-07 NOTE — Op Note (Signed)
NAME:  Christian Mccall, Christian Mccall NO.:  [id number]  MEDICAL RECORD NO.:  [id number]  LOCATION:  3312                         FACILITY:  [facility]  PHYSICIAN:  Janetta Hora. Fields, MD  DATE OF BIRTH:  [birth year]  DATE OF PROCEDURE:  01/16/2011 DATE OF DISCHARGE:                              OPERATIVE REPORT   PROCEDURE:  Gore Excluder stent graft aneurysm repair.  PREOPERATIVE DIAGNOSIS:  Abdominal aortic aneurysm.  POSTOPERATIVE DIAGNOSIS:  Abdominal aortic aneurysm.  ANESTHESIA:  General.  ASSISTANT:  Della Goo, PA-C  OPERATIVE FINDINGS: 1. 23 x 14 x 18 the left main body Gore Excluder graft. 2. 16 x 13.5 right iliac limb. 3. Right side ProGlide closure. 4. Left side open closure.  OPERATIVE DETAILS:  After obtaining informed consent, the patient was taken to the operating room.  The patient was placed in supine position on the operating room table.  After induction of general anesthesia and endotracheal intubation, a Foley catheter was placed.  Next, the patient was prepped and draped in the usual sterile fashion from the nipples to the knees bilaterally.  Next, ultrasound was brought up on the operative field and used to identify the right common femoral artery as well as the femoral bifurcation.  There was some mild to moderate calcification.  A #11 blade was used to make a small nick in the skin near the inguinal crease.  A hemostat was then used to spread the subcutaneous tissues down to the level of the right common femoral artery.  Introducer needle was then used to cannulate the right common femoral artery and a 0.035 Bentson wire threaded up into the abdominal aorta under fluoroscopic guidance.  The patient was given 10,000 units of heparin.  Next, a ProGlide device was brought up in the operative field and this was advanced over the guidewire into the right common femoral artery.  The guidewire was removed and there was good pulsatile backbleeding  from the ProGlide device.  This device was then fired after pulling the footplate.  The sutures were retrieved.  The footplate was placed back down and the guidewire advanced back through the ProGlide device and an additional ProGlide device brought up in the operative field in a similar fashion.  This was fired, however, the sutures broke and an additional third ProGlide device was brought up in the operative field and advanced over the guidewire and this was deployed in a similar fashion to the initial ProGlide device.  These were placed at a 10 and 2 o'clock position, so we had adequate hemostasis at the end of the case. ProGlide device was removed.  The guidewire was advanced back through the ProGlide device and an 8-French long sheath was placed over the guidewire up into the right iliac system.  Attention was then turned to the left groin.  In a similar fashion, the left groin was examined under ultrasound.  The common femoral artery as well as the femoral bifurcation were both identified.  A small nick was made in the inguinal crease and hemostat was used to spread all the way down to the level of the left common femoral artery.  Introducer needle was then placed in  the left common femoral artery and a 0.035 Bentson wire was advanced into the left iliac system.  A ProGlide device was then brought up in the operative field and placed over the guidewire and in the usual fashion deployed at a 10 o'clock position.  Additional ProGlide device was brought into the operative field.  When trying to deploy this, the suture broke and the suture also broke on an additional device, but on the fourth device I was able to successfully deploy this ProGlide device at a 2 o'clock position.  These were then clamped and the ProGlide device was removed after advancing the Bentson wire back up in the left iliac system and a short 8-French sheath was placed over the guidewire in the left common femoral  artery.  At this point, a 5-French Omni flush catheter was placed over the guidewire just above the level of the renal arteries and abdominal aortogram was obtained to confirm sizing of the aortic graft and a 23 x 14 x 18 mm main body device was selected.  The 8- French sheath in the left groin was removed over the guidewire.  The Bentson wire was removed from the left femoral sheath and an 0.035 Amplatz wire was advanced through the left femoral sheath up into the abdominal aorta.  A Kumpe catheter was used to direct the Amplatz wire up into the descending thoracic aorta and the Kumpe catheter was then removed followed by removal of the short 8-French sheath in the left groin.  The 18-French delivery sheath was then brought into the operative field and advanced over the Amplatz wire up to the level of the renal arteries.  Repeat contrast angiogram was performed to confirm location of the renal arteries.  There were two arteries on the left side and one on the right.  The left side was the lowest.  Using fluoroscopic angiographic guidance, the Gore Excluder graft was deployed at its proximal end.  On initial deployment, this was slightly high and encroaching on the lowest left renal artery.  Therefore, the CTAG device was re-constrained and pulled down approximately a millimeter and then redeployed and this had good apposition just below the level of the left renal artery at this point.  The device was then deployed all the way down to the level of the contralateral gate.  At this point, a 5-French Kumpe catheter was advanced over a Bentson wire through the right groin. An attempt was made to cannulate the groin using a Bentson wire, however, this was floppy on the tip and would not directly advance through the contralateral gate.  Therefore, the Bentson wire was removed and an 0.035 angled Glidewire brought into the operative field.  I was then able to use this to advance fairly easily up  into the right iliac limb of the graft and up into the descending thoracic aorta.  A 5-French Omni flush catheter was advanced over the Glidewire and brought down in the main body of the graft and twirled to confirm that we were within the limb of the graft.  At this point, an 0.035 Amplatz wire was then advanced through the Omni flush catheter up into the descending thoracic aorta.  An additional contrast angiogram was obtained in a retrograde fashion through the 8-French sheath on the right side to confirm length and location of the right internal iliac artery and a 16 x 13.5 right iliac limb was selected.  The long 8-French sheath was then removed over the Amplatz wire in the  right groin, exchanged for a 16-French delivery sheath.  This was advanced up to the level of the long marker in the stent graft.  The 16 x 13.5 right iliac limb was advanced over the guidewire and the sheath was pulled back and the limb deployed covering the entire long marker all the way down to the level of the right internal iliac artery.  The delivery system was then removed from the right side.  Attention was then turned back to the left side and the remainder of the left limb was fully deployed.  The delivery system was then removed from both sides.  At this point, a Coda balloon was brought into the operative field and advanced over the Amplatz wire up to the level of the proximal stent graft fixation site and this was ballooned as well as the attachment site of the right iliac limb and the distal apposition side of the right iliac limb.  The Coda balloon was then moved over to the left Amplatz wire and the distal landing zone of the left iliac limb was also ballooned with a Coda balloon.  Coda balloon was then removed and the 5-French Omni flush catheter brought back up into the operative field, advanced just above the level of renal arteries and completion arteriogram was obtained.  This showed  good apposition with no proximal type 1 endoleak.  The left superior and inferior renal arteries were widely patent.  The right renal artery was patent.  There was no type 1 or type 2 endoleak.  There was good filling of the left and right common iliac arteries.  The internal iliac arteries were patent bilaterally.  There was poor opacification of the right external iliac artery due to almost total occlusion from the sheath.  At this point, the Omni flush catheter was removed over a guidewire and we turned our attention back to the right groin.  The right common femoral sheath was slowly removed.  There had been some resistance initially on placing the sheath due to some plaque in the right common femoral artery, but on removing the sheath there was excellent inflow.  The pre-close sutures were then sutured down and tightened in the usual fashion with the two sutures in place.  There was good hemostasis in the right groin.  A hemostat was placed on the sutures to keep tension on the stitch until the left groin could be closed.  Attention was then turned to the left groin.  The left sheath was also removed with a guidewire left in place and an attempt was made to deploy the sutures that had been previously placed.  However, although the first suture deployed well, the second suture I could not snug down easily.  I tried to advance a wire for additional ProGlide device over the wire, but this would not go and it appeared that the somehow the suture had become entangled around the guidewire.  At this point, direct pressure was held above and below the level of the artery and I performed a 2-inch incision in the left groin to cut down to where the guidewire was entering the artery.  I could then see at this point there was a ProGlide suture device entangled around the wire.  Once this was cut, I was able to advance the 18-French delivery sheath back over the guidewire to obtain hemostasis in  the left common femoral artery.  I then proceeded to cut down all the way on the left common femoral artery.  Left common femoral artery was dissected free circumferentially and the vessel loop was placed around this.  The profunda femoris and superficial femoral arteries were identified.  These were dissected free circumferentially and vessel loops were placed around these.  There was a large amount of calcified plaque at the femoral bifurcation especially at the origin of the right superficial femoral artery.  After good vascular control had been obtained, the patient was given an additional 5000 units of heparin so that we could repair the artery on the left side.  The 18-French sheath was removed as well as the guidewire and there was good hemostasis.  The arteriotomy was then repaired using interrupted 6-0 Prolene sutures.  Just prior to completion anastomosis, this was forebled, backbled, and thoroughly flushed.  Anastomosis was secured.  Clamps were released.  There was good pulsatile flow in the common femoral artery immediately.  Both feet were inspected and found to be pink and warm.  The patient's 5000 units heparin bolus was then reversed with 50 mg of protamine.  The patient tolerated the procedure well and there were no complications.  The right groin incision was closed in multiple layers of running 2-0, 3-0, and 4-0 Vicryl suture. The right percutaneous incision was closed with a single Vicryl stitch. The patient tolerated the procedure well and there were no complications.  Instrument, sponge, and needle counts were correct at the end of the case.  The patient was taken to the recovery room in stable condition.  His feet were pink, warm, and well perfused at the end of the case.     Janetta Hora. Fields, MD     CEF/MEDQ  D:  01/16/2011  T:  01/17/2011  Job:  [id number]  Electronically Signed by Fabienne Bruns MD on 02/07/2011 06:27:33 PM

## 2011-02-12 NOTE — H&P (Signed)
NAME:  Christian Mccall, Christian Mccall NO.:  [id number]  MEDICAL RECORD NO.:  [id number]  LOCATION:  [facility]                         FACILITY:  [facility]  PHYSICIAN:  Eduard Clos, MDDATE OF BIRTH:  1955/11/07  DATE OF ADMISSION:  01/23/2011 DATE OF DISCHARGE:                             HISTORY & PHYSICAL   PRIMARY CARE PHYSICIAN:  Gwen Pounds, MD  PRIMARY GASTROENTEROLOGIST:  Willis Modena, MD  PRIMARY VASCULAR SURGEON:  Janetta Hora. Fields, MD  PRIMARY CARDIOLOGIST:  Lyn Records, MD  At this time ER physician, Dr. Alonna Buckler had called Dr. Wylene Simmer who is on-call for Dr. Creola Corn and have requested hospital admission.  CHIEF COMPLAINT:  Black stools.  HISTORY OF PRESENTING ILLNESS:  A 55 year old male who just had a recent Gore excluder stent graft for aneurysm repair on January 16, 2011 for abdominal aortic aneurysm, history of CAD status post CABG status post stenting, history of CVA, hypertension, diabetes mellitus type 2, hyperlipidemia.  Since this procedure last week, he has been experiencing weakness and fatigue and has noticed that whenever he moved his bowels, the commode was having black water and eventually today he noticed that when he was wiping after moving his bowels frank blood.  He did not lose consciousness, did not have any chest pain, shortness of breath, nausea, or vomiting.  He did have mild abdominal pain only today in the both lower quadrant.  The patient had come to ER and in the ER his hemoglobin at this time was found to be stable.  He was also hemodynamically stable.  Gastroenterologist Dr. Evette Cristal Was called by the ER physician, Dr. Jason Coop.  Dr. Evette Cristal has advised hospital admission and they will be doing possible procedure for his GI bleed in the morning.  The patient denies any headache or visual symptom, any focal deficit or loss of consciousness.  Denies any dizziness nor he did have some fatigue and weakness.  PAST MEDICAL  HISTORY: 1. Recent [employer] stent graft aneurysm repair for abdominal     aortic aneurysm, history of CAD status post CABG status post     stenting, history of CVA, and TIAs. 2. History of hypertension. 3. History of hyperlipidemia. 4. History of diabetes mellitus type 2.  MEDICATIONS PRIOR TO ADMISSION:  The patient is on aspirin, Cozaar, fenofibrate, glimiperide, Klonopin, Lasix, Lopressor, Lyrica, metformin, nitroglycerin, Plavix, Prevacid, Ranexa, Spiriva, terazosin, trazodone, Ventolin inhaler, vitamin B12, Vytorin.  ALLERGIES:  No known drug allergies.  FAMILY HISTORY:  Negative for any colon cancer.  Positive for prior disease and stroke.  SOCIAL HISTORY:  The patient quit smoking 10 years ago.  Drinks alcohol very occasionally.  Has not had any drink for the last 2 months. Married, lives with his wife.  Denies any drug abuse.  REVIEW OF SYSTEMS:  As per history of present illness, nothing else significant.  PHYSICAL EXAMINATION:  GENERAL:  The patient examined at bedside, not in acute distress. VITAL SIGNS:  Blood pressure is 126/58, pulse 58 per minute, temperature 98.7, respiration 18 per minute, O2 sat 95%. HEENT:  Anicteric.  No pallor.  No discharge from ears, eyes, nose, or mouth. CHEST:  Bilateral air  entry present.  No rhonchi, no crepitation. HEART:  S1, S2 heard. ABDOMEN:  Soft, nontender.  Bowel sounds heard. CNS:  The patient is alert, awake, and oriented to time, place, and person.  Moves upper and lower extremities 5/5. EXTREMITIES:  Peripheral pulses felt.  No acute ischemic changes.  No cyanosis or clubbing.  LABORATORY DATA:  CT angio of chest, abdomen, and pelvis was done to particularly check for his aneurysm and has been read as negative for thoracic artery dissection or aneurysm.  No acute findings in his chest. Prior median sternotomy for CABG.  Endograft repair of infrarenal abdominal aortic aneurysm.  No evidence of endograft leak.   Negative abdominal aneurysm.  Sac is stable in size.  Fatty infiltration of the liver.  Mild stranding in the groin bilaterally, left greater than right.  This may be related to recent surgical intervention.  No drainable or significant hematoma is identified.  CBC:  WBC is 9.7 hemoglobin is 12.7, this is slightly more than what he had a week ago. Hematocrit 36, platelets 181,000.  PT/INR is 14.3 and 1.  Complete metabolic panel:  Sodium 137, potassium 4, chloride 102, carbon dioxide 25, glucose 124, BUN 12, creatinine 0.9, total bilirubin is 0.4, alkaline phosphatase 45, AST 27, ALT 22, total protein 6.9, albumin 3.7, calcium 9.7, lactic acid 2.9.  Fecal occult blood is positive.  ASSESSMENT: 1. Gastrointestinal bleed. 2. Recent stent graft aneurysm repair last week for abdominal aortic     aneurysm. 3. History of coronary artery disease status post coronary artery     bypass graft status post stenting. 4. History of diabetes mellitus type 2. 5. History of hypertension. 6. History of cerebrovascular accident and transient ischemic attack. 7. History of having colonoscopy and EGD done a year and half ago and     per the patient was normal.  PLAN: 1. At this time admit the patient to Step-Down Unit. 2. For his GI bleed, at this time Dr. Evette Cristal, gastroenterologist on-     call for Dr. Dulce Sellar has been consulted by ER physician.  Dr. Evette Cristal     has advised hospital admission.  The patient to be kept n.p.o.     during procedure.  The patient is being started on Protonix drip     which we will continue.  We will recheck CBC q.6 hourly at least 24     hours once stat.  The patient is also complaining of some diarrhea.     He had at least 5 episodes today and 3 episodes yesterday.  I am     going to get stool studies, particularly with C. diff PCR, ova and     parasite, culture and sensitivity.  The patient stated he has not     taken any NSAIDs or any antibiotics recently.  At this time,  the     source of GI bleeding most likely could be upper as he was     complaining of black stools but he did have one episode where he     had some frank bleeding also. 3. For his diabetes mellitus type 2, at this time the patient is kept     n.p.o.  we will check CBGs q.4 hourly while n.p.o. and keep him on     sensitive sliding scale if CBG is more than 200. 4. For hypertension, we will place the patient on p.r.n. IV labetalol. 5. History of CAD.  At this time the patient is chest pain  free.  We     will cycle cardiac markers. 6. Further recommendation based on test order, clinical course, and     consults recommendations.     Eduard Clos, MD     ANK/MEDQ  D:  01/23/2011  T:  01/24/2011  Job:  [id number]  cc:   Gwen Pounds, MD Willis Modena, MD Janetta Hora Darrick Penna, MD Lyn Records, M.D.  Electronically Signed by Midge Minium MD on 02/12/2011 09:25:16 AM

## 2011-02-14 ENCOUNTER — Other Ambulatory Visit: Payer: Self-pay | Admitting: Internal Medicine

## 2011-02-14 ENCOUNTER — Ambulatory Visit
Admission: RE | Admit: 2011-02-14 | Discharge: 2011-02-14 | Disposition: A | Discharge status: Home / Self Care | Payer: Medicare PPO | Source: Ambulatory Visit | Attending: Internal Medicine | Admitting: Internal Medicine

## 2011-02-14 DIAGNOSIS — N644 Mastodynia: Secondary | ICD-10-CM

## 2011-02-28 ENCOUNTER — Ambulatory Visit: Payer: Medicare PPO | Admitting: Vascular Surgery

## 2011-02-28 ENCOUNTER — Other Ambulatory Visit: Payer: Medicare PPO

## 2011-03-05 LAB — POCT I-STAT, CHEM 8
BUN: 15
Calcium, Ion: 1.22
Calcium, Ion: 1.22
Chloride: 106
Creatinine, Ser: 1.3
Glucose, Bld: 105 — ABNORMAL HIGH
HCT: 49
HCT: 50
Hemoglobin: 17
Potassium: 4.2
Sodium: 137
Sodium: 138
TCO2: 28
TCO2: 30

## 2011-03-05 LAB — DIFFERENTIAL
Basophils Absolute: 0.1
Eosinophils Absolute: 0.2
Lymphocytes Relative: 30
Lymphs Abs: 3.2
Lymphs Abs: 3.7
Monocytes Absolute: 0.8
Monocytes Absolute: 0.9
Monocytes Relative: 6
Neutro Abs: 7.3
Neutrophils Relative %: 68

## 2011-03-05 LAB — POCT CARDIAC MARKERS
CKMB, poc: <1 — ABNORMAL LOW
CKMB, poc: <1 — ABNORMAL LOW
Operator id: 146091
Troponin i, poc: <0.05
Troponin i, poc: <0.05

## 2011-03-05 LAB — CBC
HCT: 47.6
HCT: 49
Hemoglobin: 16.5
Hemoglobin: 17.2 — ABNORMAL HIGH
MCV: 92.1
Platelets: 199
RBC: 5.32
RDW: 12.8
WBC: 12.3 — ABNORMAL HIGH
WBC: 13.2 — ABNORMAL HIGH

## 2011-03-05 LAB — PROTIME-INR: INR: 1

## 2011-06-06 ENCOUNTER — Ambulatory Visit: Payer: Medicare PPO | Admitting: Vascular Surgery

## 2011-07-30 ENCOUNTER — Other Ambulatory Visit: Payer: Self-pay | Admitting: Vascular Surgery

## 2011-07-30 LAB — BUN: BUN: 17 mg/dL (ref 6–23)

## 2011-07-30 LAB — CREATININE, SERUM: Creat: 1.05 mg/dL (ref 0.50–1.35)

## 2011-07-31 ENCOUNTER — Encounter: Payer: Self-pay | Admitting: Vascular Surgery

## 2011-08-01 ENCOUNTER — Ambulatory Visit
Admission: RE | Admit: 2011-08-01 | Discharge: 2011-08-01 | Disposition: A | Discharge status: Home / Self Care | Payer: Medicare PPO | Source: Ambulatory Visit | Attending: Vascular Surgery | Admitting: Vascular Surgery

## 2011-08-01 ENCOUNTER — Encounter: Payer: Self-pay | Admitting: Vascular Surgery

## 2011-08-01 ENCOUNTER — Ambulatory Visit (INDEPENDENT_AMBULATORY_CARE_PROVIDER_SITE_OTHER): Payer: Medicare PPO | Admitting: Vascular Surgery

## 2011-08-01 ENCOUNTER — Ambulatory Visit
Admit: 2011-08-01 | Discharge: 2011-08-01 | Disposition: A | Discharge status: Home / Self Care | Payer: Medicare PPO | Attending: Vascular Surgery | Admitting: Vascular Surgery

## 2011-08-01 ENCOUNTER — Other Ambulatory Visit: Payer: Medicare PPO

## 2011-08-01 ENCOUNTER — Other Ambulatory Visit (INDEPENDENT_AMBULATORY_CARE_PROVIDER_SITE_OTHER): Payer: Medicare PPO | Admitting: Vascular Surgery

## 2011-08-01 ENCOUNTER — Ambulatory Visit: Payer: Medicare PPO

## 2011-08-01 VITALS — BP 111/63 | HR 57 | Temp 97.6°F | Ht 71.0 in | Wt 224.7 lb

## 2011-08-01 DIAGNOSIS — I6529 Occlusion and stenosis of unspecified carotid artery: Secondary | ICD-10-CM

## 2011-08-01 DIAGNOSIS — G459 Transient cerebral ischemic attack, unspecified: Secondary | ICD-10-CM

## 2011-08-01 DIAGNOSIS — I714 Abdominal aortic aneurysm, without rupture: Secondary | ICD-10-CM

## 2011-08-01 MED ORDER — IOHEXOL 350 MG/ML SOLN
100.0000 mL | Freq: Once | INTRAVENOUS | Status: AC | PRN
  Administered 2011-08-01: 100 mL via INTRAVENOUS

## 2011-08-01 NOTE — Progress Notes (Signed)
[facility] [place] HISTORY AND PHYSICAL    History of Present Illness:  Patient is a 56 y.o.56 y.o. year old male who presents for follow-up evaluation of AAA. He underwent Gore Excluder aneurysm stent graft repair in 8/12. The patient denies new abdominal or back pain.  The patient's atherosclerotic risk factors remain hypertension, hyperlipidemia, coronary disease, diabetes.  These are all currently stable and followed by his primary care physician. His wife states that he had 2 TIA episodes recently one of these was in September the other one was in December. Both of these involved a left-sided paralysis. He also had loss of speech with the episode in December. He did not go the hospital or have any evaluation for this. He apparently has had multiple TIAs in the past. He has had no symptoms since December. He also complains of some numbness on the left inner aspect of his thigh. He has some occasional burning pain in this location as well. We did have to perform a cutdown on his left groin. The right groin was done percutaneously. His last carotid duplex in our office was in 2011 which showed no significant carotid stenosis  Past Medical History  Diagnosis Date  . Hypertension   . Hyperlipidemia   . Myocardial infarction 2002  . COPD (chronic obstructive pulmonary disease)   . AAA (abdominal aortic aneurysm)   . Stroke   . Neuropathy   . CAD (coronary artery disease)   . Diabetes mellitus 2009  . Arthritis   . Joint pain   . Leg pain     with walking and pain in feet while lying flat  . Chest pain   . Shortness of breath 12/06/10    while lying flat and with exertion  . Reflux      Past Surgical History  Procedure Date  . Coronary artery bypass graft 2002  . Abdominal aortic aneurysm repair 01-16-11    EVAR       Review of Systems:  Neurologic: as above Cardiac:denies shortness of breath or chest pain Pulmonary: denies cough or wheeze Abdomen: denies  abdominal pain nausea or vomiting  History   Social History  . Marital Status: Married    Spouse Name: N/A    Number of Children: N/A  . Years of Education: N/A   Occupational History  . Not on file.   Social History Main Topics  . Smoking status: Former Smoker -- 2.0 packs/day for 30 years    Types: Cigarettes    Quit date: 06/10/2000  . Smokeless tobacco: Not on file  . Alcohol Use: No  . Drug Use: No  . Sexually Active: Not on file   Other Topics Concern  . Not on file   Social History Narrative  . No narrative on file    No Known Allergies  Current Outpatient Prescriptions on File Prior to Visit  Medication Sig Dispense Refill  . aspirin 325 MG tablet Take 325 mg by mouth daily.        . clopidogrel (PLAVIX) 75 MG tablet Take 75 mg by mouth daily.        [name] ezetimibe-simvastatin (VYTORIN) 10-40 MG per tablet Take 1 tablet by mouth at bedtime.        . famotidine (PEPCID) 20 MG tablet Take 20 mg by mouth daily.        . fenofibrate 160 MG tablet       . furosemide (LASIX) 40 MG tablet Take 40 mg by  mouth daily.        [name] glimepiride (AMARYL) 4 MG tablet Take 4 mg by mouth 2 (two) times daily.       . lansoprazole (PREVACID) 15 MG capsule Take 15 mg by mouth daily.        [name] losartan (COZAAR) 50 MG tablet Take 50 mg by mouth daily.        . metFORMIN (GLUCOPHAGE) 1000 MG tablet Take 1,000 mg by mouth. Take one tab in am and 1/2 tab at night      . metoprolol (LOPRESSOR) 50 MG tablet Take 50 mg by mouth daily.       . metroNIDAZOLE (FLAGYL) 500 MG tablet Take 500 mg by mouth 3 (three) times daily.        . nitroGLYCERIN (NITRODUR - DOSED IN MG/24 HR) 0.6 mg/hr Place 1 patch onto the skin daily.        . nitroGLYCERIN (NITROSTAT) 0.6 MG SL tablet Place 0.6 mg under the tongue every 5 (five) minutes as needed.        . pregabalin (LYRICA) 100 MG capsule Take 100 mg by mouth 2 (two) times daily.        . ranolazine (RANEXA) 1000 MG SR tablet Take 1,000 mg by mouth 2 (two)  times daily.        . simvastatin (ZOCOR) 40 MG tablet Take 40 mg by mouth at bedtime. Take one tab daily (w/ vytorin 10/40 mg qd)       . terazosin (HYTRIN) 1 MG capsule Take 1 mg by mouth at bedtime.        [name] tiotropium (SPIRIVA) 18 MCG inhalation capsule Place 18 mcg into inhaler and inhale as needed.       . traZODone (DESYREL) 150 MG tablet as needed.       . VENTOLIN HFA 108 (90 BASE) MCG/ACT inhaler Inhale 1 puff into the lungs 2 (two) times daily as needed.        No current facility-administered medications on file prior to visit.       Physical Examination    Filed Vitals:   08/01/11 1117  BP: 111/63  Pulse: 57  Temp: 97.6 F (36.4 C)  TempSrc: Oral  Height: 5\' 11"  (1.803 m)  Weight: 224 lb 11.2 oz (101.923 kg)     General:  Alert and oriented, no acute distress HEENT: Normal Neck: No bruit or JVD Pulmonary: Clear to auscultation bilaterally Cardiac: Regular Rate and Rhythm without murmur Abdomen: Soft, non-tender, non-distended, normal bowel sounds, no pulsatile mass Extremities: 2+ femoral pulses, patchy numbness left inner thigh Neuro: Symmetric upper extremity and lower extremity motor strength which is 5 over 5    DATA:  CT angiogram the abdomen and pelvis images were reviewed today. Current aneurysm diameter is  5.6cm.  There no  evidence of endoleak. The top portion of the stent graft is adjacent to the renal arteries and there is no evidence of migration.   ASSESSMENT:  Doing well status post Gore Excluder stent graft repair aneurysm.  Recent episodes of TIA-type symptoms. The patient has not had a head CT and several years and has not had a carotid duplex exam for 2 years    PLAN: Patient will return in 6 months to review his stent graft  an additional CT angiogram.  We will schedule him for a head CT noncontrast later today to evaluate for possible stroke. We will also arrange for him to have a carotid duplex exam tomorrow.  He also apparently has  followup scheduled with his primary care physician in the near future. I will call the patient regarding the results of his head CT carotid duplex exam. As far as the numbness in his left inner thigh is concerned this is most likely neuropraxia from his left groin cutdown. Hopefully this will continue to improve with time  Fabienne Bruns, MD [facility] [place] Office: [phone] Pager: [phone]

## 2011-08-01 NOTE — Progress Notes (Signed)
Addended by: Sharee Pimple on: 08/01/2011 01:53 PM   Modules accepted: Orders

## 2011-08-02 ENCOUNTER — Other Ambulatory Visit: Payer: Medicare PPO

## 2011-08-03 ENCOUNTER — Other Ambulatory Visit: Payer: Medicare PPO

## 2011-08-09 NOTE — Procedures (Unsigned)
CAROTID DUPLEX EXAM  INDICATION:  TIA, carotid stenosis.  HISTORY: Diabetes:  Yes Cardiac:  MI and CABG Hypertension:  Yes Smoking:  Previous Previous Surgery:  No carotid intervention CV History:  TIA recently, left-sided numbness and hemiparesis, currently asymptomatic Amaurosis Fugax No, Paresthesias No, Hemiparesis No                                      RIGHT             LEFT Brachial systolic pressure:         108               104 Brachial Doppler waveforms:         WNL               WNL Vertebral direction of flow:        antegrade         antegrade DUPLEX VELOCITIES (cm/sec) CCA peak systolic                   104               122 ECA peak systolic                   105               162 ICA peak systolic                   99                61 ICA end diastolic                   21                17 PLAQUE MORPHOLOGY:                  heterogeneous     heterogeneous PLAQUE AMOUNT:                      mild              mild PLAQUE LOCATION:                    CCA/ICA           CCA/ECA  IMPRESSION: 1. Bilateral internal carotid artery stenosis in the 1% to 39% range. 2. Left external carotid artery stenosis present. 3. Right external carotid artery is patent. 4. Bilateral vertebral arteries are patent and antegrade. 5. Essentially unchanged since previous study on 05/28/2010.  ___________________________________________ Janetta Hora. Fields, MD  SH/MEDQ  D:  08/01/2011  T:  08/01/2011  Job:  [id number]

## 2012-01-21 ENCOUNTER — Other Ambulatory Visit: Payer: Self-pay | Admitting: Vascular Surgery

## 2012-01-23 ENCOUNTER — Ambulatory Visit: Payer: Medicare PPO | Admitting: Vascular Surgery

## 2012-01-29 ENCOUNTER — Other Ambulatory Visit: Payer: Medicare PPO

## 2012-02-05 ENCOUNTER — Encounter: Payer: Self-pay | Admitting: Vascular Surgery

## 2012-02-06 ENCOUNTER — Ambulatory Visit (INDEPENDENT_AMBULATORY_CARE_PROVIDER_SITE_OTHER): Payer: Medicare PPO | Admitting: Vascular Surgery

## 2012-02-06 ENCOUNTER — Encounter: Payer: Self-pay | Admitting: Vascular Surgery

## 2012-02-06 ENCOUNTER — Other Ambulatory Visit: Payer: Self-pay | Admitting: *Deleted

## 2012-02-06 ENCOUNTER — Ambulatory Visit
Admission: RE | Admit: 2012-02-06 | Discharge: 2012-02-06 | Disposition: A | Discharge status: Home / Self Care | Payer: Medicare PPO | Source: Ambulatory Visit | Attending: Vascular Surgery | Admitting: Vascular Surgery

## 2012-02-06 VITALS — BP 141/76 | HR 69 | Resp 16 | Ht 71.0 in | Wt 230.0 lb

## 2012-02-06 DIAGNOSIS — Z48812 Encounter for surgical aftercare following surgery on the circulatory system: Secondary | ICD-10-CM

## 2012-02-06 DIAGNOSIS — I714 Abdominal aortic aneurysm, without rupture, unspecified: Secondary | ICD-10-CM

## 2012-02-06 MED ORDER — IOHEXOL 350 MG/ML SOLN
80.0000 mL | Freq: Once | INTRAVENOUS | Status: AC | PRN
  Administered 2012-02-06: 80 mL via INTRAVENOUS

## 2012-02-06 NOTE — Progress Notes (Signed)
[facility] [place] HISTORY AND PHYSICAL    History of Present Illness:  Patient is a 56 y.o. male who presents for follow-up evaluation of AAA. He underwent Gore Excluder aneurysm stent graft repair in August 2012. The patient denies new abdominal or back pain.  He has occasional twinges of a burning sensation or aching in the left groin. This was a side where a cutdown was required. The patient's atherosclerotic risk factors remain hypertension, hyperlipidemia, coronary artery disease, diabetes.  These are all currently stable and followed by his primary care physician.   Past Medical History  Diagnosis Date  . Hypertension   . Hyperlipidemia   . Myocardial infarction 2002  . COPD (chronic obstructive pulmonary disease)   . AAA (abdominal aortic aneurysm)   . Stroke   . Neuropathy   . CAD (coronary artery disease)   . Diabetes mellitus 2009  . Arthritis   . Joint pain   . Leg pain     with walking and pain in feet while lying flat  . Chest pain   . Shortness of breath 12/06/10    while lying flat and with exertion  . Reflux      Past Surgical History  Procedure Date  . Coronary artery bypass graft 2002  . Abdominal aortic aneurysm repair 01-16-11    EVAR       Review of Systems:  Neurologic: denies symptoms of TIA, amaurosis, or stroke Cardiac:denies shortness of breath or chest pain Pulmonary: denies cough or wheeze Abdomen: denies abdominal pain nausea or vomiting  History   Social History  . Marital Status: Married    Spouse Name: N/A    Number of Children: N/A  . Years of Education: N/A   Occupational History  . Not on file.   Social History Main Topics  . Smoking status: Former Smoker -- 2.0 packs/day for 30 years    Types: Cigarettes    Quit date: 06/10/2000  . Smokeless tobacco: Never Used  . Alcohol Use: Yes     social  . Drug Use: No  . Sexually Active: Not on file   Other Topics Concern  . Not on file   Social History  Narrative  . No narrative on file    No Known Allergies  Current Outpatient Prescriptions on File Prior to Visit  Medication Sig Dispense Refill  . aspirin 325 MG tablet Take 325 mg by mouth daily.        . clonazePAM (KLONOPIN) 1 MG tablet Take 1 mg by mouth. Take 5 nights per week      . clopidogrel (PLAVIX) 75 MG tablet Take 75 mg by mouth daily.        [name] ezetimibe-simvastatin (VYTORIN) 10-40 MG per tablet Take 1 tablet by mouth at bedtime.        . fenofibrate 160 MG tablet       . furosemide (LASIX) 40 MG tablet Take 40 mg by mouth daily.        [name] glimepiride (AMARYL) 4 MG tablet Take 4 mg by mouth daily.       . lansoprazole (PREVACID) 15 MG capsule Take 30 mg by mouth daily.       [name] losartan (COZAAR) 50 MG tablet Take 50 mg by mouth daily.        . metFORMIN (GLUCOPHAGE) 1000 MG tablet Take 1,000 mg by mouth. Take one tab in am and 1/2 tab at night      . metoprolol (  LOPRESSOR) 50 MG tablet Take 50 mg by mouth daily.       . nitroGLYCERIN (NITRODUR - DOSED IN MG/24 HR) 0.6 mg/hr Place 1 patch onto the skin daily.        . nitroGLYCERIN (NITROSTAT) 0.6 MG SL tablet Place 0.6 mg under the tongue every 5 (five) minutes as needed.        . pregabalin (LYRICA) 100 MG capsule Take 100 mg by mouth 2 (two) times daily.        . ranolazine (RANEXA) 1000 MG SR tablet Take 1,000 mg by mouth 2 (two) times daily.        [name] terazosin (HYTRIN) 1 MG capsule Take 1 mg by mouth at bedtime.        [name] tiotropium (SPIRIVA) 18 MCG inhalation capsule Place 18 mcg into inhaler and inhale as needed.       . traZODone (DESYREL) 150 MG tablet as needed. 2 night per week      . VENTOLIN HFA 108 (90 BASE) MCG/ACT inhaler Inhale 1 puff into the lungs 2 (two) times daily as needed.       . famotidine (PEPCID) 20 MG tablet Take 20 mg by mouth daily.        . metroNIDAZOLE (FLAGYL) 500 MG tablet Take 500 mg by mouth 3 (three) times daily.        . simvastatin (ZOCOR) 40 MG tablet Take 40 mg by mouth at bedtime.  Take one tab daily (w/ vytorin 10/40 mg qd)        No current facility-administered medications on file prior to visit.       Physical Examination    Filed Vitals:   02/06/12 1120  BP: 141/76  Pulse: 69  Resp: 16  Height: 5\' 11"  (1.803 m)  Weight: 230 lb (104.327 kg)  SpO2: 97%     General:  Alert and oriented, no acute distress HEENT: Normal Neck: No bruit or JVD Pulmonary: Clear to auscultation bilaterally Cardiac: Regular Rate and Rhythm without murmur Abdomen: Soft, non-tender, non-distended, normal bowel sounds, no pulsatile mass Extremities: 2+ femoral pulses, well-healed left groin incision   DATA:  CT angiogram the abdomen and pelvis images were reviewed today. Current aneurysm diameter is  5.5 cm decreased from 5.6 m.  There is  evidence of a type II endoleak. This is on the posterior wall of the aneurysm The top portion of the stent graft is adjacent to the renal arteries and there is no evidence of migration.   ASSESSMENT:  Doing well status post Gore Excluder stent graft repair aneurysm possible type II leak but with overall decrease in size of aneurysm   PLAN: Patient will return in one year to review his stent graft. The importance of followup was stressed to the patient today and the type II endoleak was also explained to the patient today.  Fabienne Bruns, MD [facility] [place] Office: [phone] Pager: [phone]

## 2012-02-06 NOTE — Addendum Note (Signed)
Addended by: Sharee Pimple on: 02/06/2012 12:46 PM   Modules accepted: Orders

## 2012-02-26 ENCOUNTER — Other Ambulatory Visit: Payer: Self-pay | Admitting: Neurology

## 2012-02-26 DIAGNOSIS — G459 Transient cerebral ischemic attack, unspecified: Secondary | ICD-10-CM

## 2012-03-12 ENCOUNTER — Ambulatory Visit
Admission: RE | Admit: 2012-03-12 | Discharge: 2012-03-12 | Disposition: A | Discharge status: Home / Self Care | Payer: Medicare PPO | Source: Ambulatory Visit | Attending: Neurology | Admitting: Neurology

## 2012-03-12 DIAGNOSIS — G459 Transient cerebral ischemic attack, unspecified: Secondary | ICD-10-CM

## 2012-03-25 ENCOUNTER — Other Ambulatory Visit: Payer: Self-pay | Admitting: Neurology

## 2012-03-25 DIAGNOSIS — G459 Transient cerebral ischemic attack, unspecified: Secondary | ICD-10-CM

## 2012-03-26 ENCOUNTER — Other Ambulatory Visit: Payer: Self-pay | Admitting: Neurology

## 2012-03-26 DIAGNOSIS — G459 Transient cerebral ischemic attack, unspecified: Secondary | ICD-10-CM

## 2012-03-30 ENCOUNTER — Ambulatory Visit
Admission: RE | Admit: 2012-03-30 | Discharge: 2012-03-30 | Disposition: A | Discharge status: Home / Self Care | Payer: Medicare PPO | Source: Ambulatory Visit | Attending: Neurology | Admitting: Neurology

## 2012-03-30 DIAGNOSIS — G459 Transient cerebral ischemic attack, unspecified: Secondary | ICD-10-CM

## 2012-03-30 MED ORDER — GADOBENATE DIMEGLUMINE 529 MG/ML IV SOLN
20.0000 mL | Freq: Once | INTRAVENOUS | Status: AC | PRN
  Administered 2012-03-30: 20 mL via INTRAVENOUS

## 2012-04-24 ENCOUNTER — Other Ambulatory Visit: Payer: Self-pay | Admitting: Gastroenterology

## 2012-05-31 ENCOUNTER — Encounter (HOSPITAL_COMMUNITY): Payer: Self-pay | Admitting: Nurse Practitioner

## 2012-05-31 ENCOUNTER — Emergency Department (HOSPITAL_COMMUNITY)
Admission: EM | Admit: 2012-05-31 | Discharge: 2012-05-31 | Disposition: A | Discharge status: Home / Self Care | Payer: Medicare PPO | Attending: Emergency Medicine | Admitting: Emergency Medicine

## 2012-05-31 ENCOUNTER — Emergency Department (HOSPITAL_COMMUNITY): Payer: Medicare PPO

## 2012-05-31 DIAGNOSIS — Z8673 Personal history of transient ischemic attack (TIA), and cerebral infarction without residual deficits: Secondary | ICD-10-CM | POA: Insufficient documentation

## 2012-05-31 DIAGNOSIS — Z9889 Other specified postprocedural states: Secondary | ICD-10-CM | POA: Insufficient documentation

## 2012-05-31 DIAGNOSIS — I252 Old myocardial infarction: Secondary | ICD-10-CM | POA: Insufficient documentation

## 2012-05-31 DIAGNOSIS — I2510 Atherosclerotic heart disease of native coronary artery without angina pectoris: Secondary | ICD-10-CM | POA: Insufficient documentation

## 2012-05-31 DIAGNOSIS — Z8739 Personal history of other diseases of the musculoskeletal system and connective tissue: Secondary | ICD-10-CM | POA: Insufficient documentation

## 2012-05-31 DIAGNOSIS — Z7982 Long term (current) use of aspirin: Secondary | ICD-10-CM | POA: Insufficient documentation

## 2012-05-31 DIAGNOSIS — J449 Chronic obstructive pulmonary disease, unspecified: Secondary | ICD-10-CM | POA: Insufficient documentation

## 2012-05-31 DIAGNOSIS — E119 Type 2 diabetes mellitus without complications: Secondary | ICD-10-CM | POA: Insufficient documentation

## 2012-05-31 DIAGNOSIS — E785 Hyperlipidemia, unspecified: Secondary | ICD-10-CM | POA: Insufficient documentation

## 2012-05-31 DIAGNOSIS — R071 Chest pain on breathing: Secondary | ICD-10-CM | POA: Insufficient documentation

## 2012-05-31 DIAGNOSIS — Z87891 Personal history of nicotine dependence: Secondary | ICD-10-CM | POA: Insufficient documentation

## 2012-05-31 DIAGNOSIS — I10 Essential (primary) hypertension: Secondary | ICD-10-CM | POA: Insufficient documentation

## 2012-05-31 DIAGNOSIS — K219 Gastro-esophageal reflux disease without esophagitis: Secondary | ICD-10-CM | POA: Insufficient documentation

## 2012-05-31 DIAGNOSIS — Z8719 Personal history of other diseases of the digestive system: Secondary | ICD-10-CM | POA: Insufficient documentation

## 2012-05-31 DIAGNOSIS — Z79899 Other long term (current) drug therapy: Secondary | ICD-10-CM | POA: Insufficient documentation

## 2012-05-31 DIAGNOSIS — J4489 Other specified chronic obstructive pulmonary disease: Secondary | ICD-10-CM | POA: Insufficient documentation

## 2012-05-31 DIAGNOSIS — Z8669 Personal history of other diseases of the nervous system and sense organs: Secondary | ICD-10-CM | POA: Insufficient documentation

## 2012-05-31 DIAGNOSIS — R0781 Pleurodynia: Secondary | ICD-10-CM

## 2012-05-31 LAB — BASIC METABOLIC PANEL
BUN: 12 mg/dL (ref 6–23)
Chloride: 100 mEq/L (ref 96–112)
Creatinine, Ser: 0.9 mg/dL (ref 0.50–1.35)
GFR calc Af Amer: >90 mL/min (ref >90)
GFR calc non Af Amer: >90 mL/min (ref >90)
Potassium: 4 mEq/L (ref 3.5–5.1)

## 2012-05-31 LAB — CBC
HCT: 40.5 % (ref 39.0–52.0)
MCHC: 35.3 g/dL (ref 30.0–36.0)
MCV: 89.8 fL (ref 78.0–100.0)
Platelets: 91 10*3/uL — ABNORMAL LOW (ref 150–400)
RDW: 12.9 % (ref 11.5–15.5)
WBC: 5.7 10*3/uL (ref 4.0–10.5)

## 2012-05-31 LAB — PRO B NATRIURETIC PEPTIDE: Pro B Natriuretic peptide (BNP): 85.7 pg/mL (ref 0–125)

## 2012-05-31 MED ORDER — KETOROLAC TROMETHAMINE 30 MG/ML IJ SOLN
30.0000 mg | Freq: Once | INTRAMUSCULAR | Status: AC
  Administered 2012-05-31: 30 mg via INTRAVENOUS
  Filled 2012-05-31: qty 1

## 2012-05-31 MED ORDER — IOHEXOL 350 MG/ML SOLN
80.0000 mL | Freq: Once | INTRAVENOUS | Status: AC | PRN
  Administered 2012-05-31: 80 mL via INTRAVENOUS

## 2012-05-31 MED ORDER — METHOCARBAMOL 500 MG PO TABS: 500.0000 mg | ORAL_TABLET | Freq: Two times a day (BID) | ORAL | Status: DC

## 2012-05-31 MED ORDER — IBUPROFEN 600 MG PO TABS: 600.0000 mg | ORAL_TABLET | Freq: Four times a day (QID) | ORAL | Status: DC | PRN

## 2012-05-31 NOTE — ED Notes (Addendum)
Pt c/o R sided CP, diaphoresis, and HTN at home since 1300 today. Pt reports pain constant since onset and makes him feel very SOB, pain increased with movement and inspiration. A&Ox4, resp e/u

## 2012-05-31 NOTE — ED Notes (Signed)
Spoke with patient to hold metformin x48 hrs after contrast.

## 2012-05-31 NOTE — ED Provider Notes (Signed)
History     CSN: [id number]  Arrival date & time 05/31/12  1435   First MD Initiated Contact with Patient 05/31/12 1522      Chief Complaint  Patient presents with  . Chest Pain    (Consider location/radiation/quality/duration/timing/severity/associated sxs/prior treatment) HPI Pt with extended cardiac hx and COPD presents with worsening SOb with exertion and sharp pleuritic R chest pain starting this afternoon. No fever chills, cough, lower ext swelling or pain. Chest pain has improved and now states it is dull ache worse with deep inspiration. Followed by Dr Garnette Scheuermann Past Medical History  Diagnosis Date  . Hypertension   . Hyperlipidemia   . Myocardial infarction 2002  . COPD (chronic obstructive pulmonary disease)   . AAA (abdominal aortic aneurysm)   . Stroke   . Neuropathy   . CAD (coronary artery disease)   . Diabetes mellitus 2009  . Arthritis   . Joint pain   . Leg pain     with walking and pain in feet while lying flat  . Chest pain   . Shortness of breath 12/06/10    while lying flat and with exertion  . Reflux     Past Surgical History  Procedure Date  . Coronary artery bypass graft 2002  . Abdominal aortic aneurysm repair 01-16-11    EVAR  . Abdominal aortic aneurysm repair     Family History  Problem Relation Age of Onset  . Aneurysm Mother     AAA  . Heart disease Mother   . Other Mother     Carotid artery stenosis  . Aneurysm Maternal Aunt     AAA  . Other Maternal Aunt     AAA  . Aneurysm Maternal Uncle     AAA  . Other Maternal Uncle     AAA  . Lupus Sister   . Heart disease Brother   . Heart disease Brother     History  Substance Use Topics  . Smoking status: Former Smoker -- 2.0 packs/day for 30 years    Types: Cigarettes    Quit date: 06/10/2000  . Smokeless tobacco: Never Used  . Alcohol Use: Yes     Comment: social      Review of Systems  Constitutional: Negative for fever and chills.  HENT: Negative for neck pain.    Respiratory: Positive for shortness of breath. Negative for chest tightness and wheezing.   Cardiovascular: Positive for chest pain. Negative for palpitations and leg swelling.  Gastrointestinal: Negative for nausea, vomiting and abdominal pain.  Musculoskeletal: Negative for myalgias and back pain.  Skin: Negative for rash and wound.  Neurological: Negative for dizziness, weakness, light-headedness, numbness and headaches.  All other systems reviewed and are negative.    Allergies  Review of patient's allergies indicates no known allergies.  Home Medications   Current Outpatient Rx  Name  Route  Sig  Dispense  Refill  . ASPIRIN 325 MG PO TABS   Oral   Take 325 mg by mouth daily.           [name] CLONAZEPAM 1 MG PO TABS   Oral   Take 1 mg by mouth at bedtime.          . CLOPIDOGREL BISULFATE 75 MG PO TABS   Oral   Take 75 mg by mouth daily.           [name] VITAMIN B-12 IJ   Injection   Inject as directed as directed. Inject every  2 minutes         . ESCITALOPRAM OXALATE 10 MG PO TABS   Oral   Take 10 mg by mouth daily.          [name] EZETIMIBE-SIMVASTATIN 10-40 MG PO TABS   Oral   Take 1 tablet by mouth at bedtime.           [name] FAMOTIDINE 20 MG PO TABS   Oral   Take 20 mg by mouth daily.           . FENOFIBRATE 160 MG PO TABS   Oral   Take 160 mg by mouth daily.          . FUROSEMIDE 40 MG PO TABS   Oral   Take 40 mg by mouth every other day.          [name] GLIMEPIRIDE 4 MG PO TABS   Oral   Take 4 mg by mouth daily.          [name] LANSOPRAZOLE 15 MG PO CPDR   Oral   Take 30 mg by mouth daily.          [name] LOSARTAN POTASSIUM 50 MG PO TABS   Oral   Take 50 mg by mouth daily.           [name] METFORMIN HCL 1000 MG PO TABS   Oral   Take 1,000 mg by mouth. Take one tab in am and 1/2 tab at night         . METOPROLOL TARTRATE 50 MG PO TABS   Oral   Take 50 mg by mouth 2 (two) times daily.          [name] NITROGLYCERIN 0.6 MG/HR TD PT24   Transdermal    Place 1 patch onto the skin daily.           [name] NITROGLYCERIN 0.6 MG SL SUBL   Sublingual   Place 0.6 mg under the tongue every 5 (five) minutes as needed.           . NON FORMULARY   Oral   Take 1 capsule by mouth daily. [employer] Medication         . PRAMIPEXOLE DIHYDROCHLORIDE 0.25 MG PO TABS   Oral   Take 0.25 mg by mouth at bedtime.          [name] PREGABALIN 100 MG PO CAPS   Oral   Take 100 mg by mouth 2 (two) times daily.           [name] RANOLAZINE ER 1000 MG PO TB12   Oral   Take 1,000 mg by mouth 2 (two) times daily.           [name] TERAZOSIN HCL 1 MG PO CAPS   Oral   Take 1 mg by mouth at bedtime.           [name] TIOTROPIUM BROMIDE MONOHYDRATE 18 MCG IN CAPS   Inhalation   Place 18 mcg into inhaler and inhale daily.          . VENTOLIN HFA 108 (90 BASE) MCG/ACT IN AERS   Inhalation   Inhale 1 puff into the lungs 2 (two) times daily as needed. For shortness of breath/difficulty breathing         . IBUPROFEN 600 MG PO TABS   Oral   Take 1 tablet (600 mg total) by mouth every 6 (six) hours as needed for pain.   30 tablet   0   .  METHOCARBAMOL 500 MG PO TABS   Oral   Take 1 tablet (500 mg total) by mouth 2 (two) times daily.   20 tablet   0     BP 106/56  Pulse 60  Temp 98.3 F (36.8 C) (Oral)  Resp 16  SpO2 97%  Physical Exam  Nursing note and vitals reviewed. Constitutional: He is oriented to person, place, and time. He appears well-developed and well-nourished. No distress.  HENT:  Head: Normocephalic and atraumatic.  Mouth/Throat: Oropharynx is clear and moist.  Eyes: EOM are normal. Pupils are equal, round, and reactive to light.  Neck: Normal range of motion. Neck supple.  Cardiovascular: Normal rate and regular rhythm.   Pulmonary/Chest: Effort normal. No respiratory distress. He has no wheezes. He has rales (few rhonchi in bases). He exhibits no tenderness.  Abdominal: Soft. Bowel sounds are normal. He exhibits no distension  and no mass. There is no tenderness. There is no rebound and no guarding.  Musculoskeletal: Normal range of motion. He exhibits no edema and no tenderness.       No calf swelling or tendernss  Neurological: He is alert and oriented to person, place, and time.       5/5 motor in all ext, sensation intact  Skin: Skin is warm and dry. No rash noted. No erythema.  Psychiatric: He has a normal mood and affect. His behavior is normal.    ED Course  Procedures (including critical care time)  Labs Reviewed  CBC - Abnormal; Notable for the following:    Platelets 91 (*)  PLATELET COUNT CONFIRMED BY SMEAR   All other components within normal limits  BASIC METABOLIC PANEL - Abnormal; Notable for the following:    Glucose, Bld 249 (*)     All other components within normal limits  D-DIMER, QUANTITATIVE - Abnormal; Notable for the following:    D-Dimer, Quant 0.65 (*)     All other components within normal limits  PRO B NATRIURETIC PEPTIDE  POCT I-STAT TROPONIN I  TROPONIN I   Dg Chest 2 View  05/31/2012  *RADIOLOGY REPORT*  Clinical Data: Right side chest pain worse with deep inspiration, history COPD, hypertension, diabetes, coronary disease post PTCA and CABG  CHEST - 2 VIEW  Comparison: 01/16/2011  Findings: Enlargement of cardiac silhouette post CABG. Mediastinal contours normal. Minimal pulmonary vascular congestion. Decreased lung volumes with minimal bibasilar atelectasis. No acute failure or consolidation. No pleural effusion or pneumothorax. Bones unremarkable.  IMPRESSION: Enlargement of cardiac silhouette post CABG. Bibasilar atelectasis.   Original Report Authenticated By: Ulyses Southward, M.D.    Ct Angio Chest W/cm &/or Wo Cm  05/31/2012  *RADIOLOGY REPORT*  Clinical Data: Right-sided chest pain, diaphoresis, some shortness of breath  CT ANGIOGRAPHY CHEST  Technique:  Multidetector CT imaging of the chest using the standard protocol during bolus administration of intravenous contrast.  Multiplanar reconstructed images including MIPs were obtained and reviewed to evaluate the vascular anatomy.  Contrast: 80mL OMNIPAQUE IOHEXOL 350 MG/ML SOLN  Comparison: Chest x-ray of 05/31/2012 and CT angio chest of 01/23/2011  Findings: The pulmonary arteries are well opacified and no acute pulmonary embolism is seen.  The thoracic aorta is partially opacified with no acute abnormality noted.  The origins of the great vessels appear patent.  Median sternotomy sutures are noted from prior CABG and there is mediastinal lipomatosis present.  No mediastinal or hilar adenopathy is seen.  Mild cardiomegaly is stable.  No abnormality of the upper abdomen is seen.  On the lung window images, no infiltrate is seen and there is no evidence of pleural effusion.  No suspicious lung nodule is noted. There are diffuse degenerative changes throughout the thoracic spine.  IMPRESSION:  1.  Negative CT angiogram of the chest.  No evidence of acute pulmonary embolism. 2.  No infiltrate or effusion is seen.   Original Report Authenticated By: Dwyane Dee, M.D.      1. Pleuritic chest pain       Date: 05/31/2012  Rate: 67  Rhythm: normal sinus rhythm  QRS Axis: normal  Intervals: normal  ST/T Wave abnormalities: nonspecific T wave changes  Conduction Disutrbances:none  Narrative Interpretation:   Old EKG Reviewed: unchanged   MDM   Pt symptoms have improved. Though he still experiences "twinges of pain" that last only seconds and completely resolves.  Negative CT angio chest and trop x 2. Discussed with Dr Mayford Knife who reviewed and stated she doe not believe this is cardiac related. Given nature of pain may be muscle spasm. I do not believe serious illness is cause for pt's symptoms and believe that he has been appropriately screened. He has been given strict instruction to return immediatly for any worsening or symptoms or concerns. Also advised to f/u with Dr Katrinka Blazing.       Loren Racer, MD 05/31/12  2001

## 2012-08-25 ENCOUNTER — Other Ambulatory Visit: Payer: Self-pay | Admitting: Neurology

## 2012-09-02 ENCOUNTER — Other Ambulatory Visit (HOSPITAL_COMMUNITY): Payer: Self-pay | Admitting: Internal Medicine

## 2012-09-02 DIAGNOSIS — J441 Chronic obstructive pulmonary disease with (acute) exacerbation: Secondary | ICD-10-CM

## 2012-09-09 ENCOUNTER — Encounter (HOSPITAL_COMMUNITY): Payer: Medicare PPO

## 2012-09-10 ENCOUNTER — Ambulatory Visit (HOSPITAL_COMMUNITY)
Admission: RE | Admit: 2012-09-10 | Discharge: 2012-09-10 | Disposition: A | Discharge status: Home / Self Care | Payer: 59 | Source: Ambulatory Visit | Attending: Internal Medicine | Admitting: Internal Medicine

## 2012-09-10 DIAGNOSIS — J441 Chronic obstructive pulmonary disease with (acute) exacerbation: Secondary | ICD-10-CM | POA: Insufficient documentation

## 2012-09-10 MED ORDER — ALBUTEROL SULFATE (5 MG/ML) 0.5% IN NEBU
2.5000 mg | INHALATION_SOLUTION | Freq: Once | RESPIRATORY_TRACT | Status: AC
  Administered 2012-09-10: 2.5 mg via RESPIRATORY_TRACT

## 2012-11-12 ENCOUNTER — Encounter: Payer: Self-pay | Admitting: Neurology

## 2012-11-12 ENCOUNTER — Ambulatory Visit (INDEPENDENT_AMBULATORY_CARE_PROVIDER_SITE_OTHER): Payer: 59 | Admitting: Neurology

## 2012-11-12 ENCOUNTER — Other Ambulatory Visit: Payer: Self-pay | Admitting: *Deleted

## 2012-11-12 VITALS — BP 125/71 | HR 65 | Temp 97.5°F | Ht 72.0 in | Wt 239.0 lb

## 2012-11-12 DIAGNOSIS — G2581 Restless legs syndrome: Secondary | ICD-10-CM | POA: Insufficient documentation

## 2012-11-12 DIAGNOSIS — R413 Other amnesia: Secondary | ICD-10-CM | POA: Insufficient documentation

## 2012-11-12 DIAGNOSIS — M549 Dorsalgia, unspecified: Secondary | ICD-10-CM | POA: Insufficient documentation

## 2012-11-12 DIAGNOSIS — R29898 Other symptoms and signs involving the musculoskeletal system: Secondary | ICD-10-CM | POA: Insufficient documentation

## 2012-11-12 DIAGNOSIS — G8190 Hemiplegia, unspecified affecting unspecified side: Secondary | ICD-10-CM | POA: Insufficient documentation

## 2012-11-12 DIAGNOSIS — M542 Cervicalgia: Secondary | ICD-10-CM | POA: Insufficient documentation

## 2012-11-12 DIAGNOSIS — M545 Low back pain, unspecified: Secondary | ICD-10-CM

## 2012-11-12 HISTORY — DX: Cervicalgia: M54.2

## 2012-11-12 HISTORY — DX: Low back pain, unspecified: M54.50

## 2012-11-12 HISTORY — DX: Other symptoms and signs involving the musculoskeletal system: R29.898

## 2012-11-12 HISTORY — DX: Hemiplegia, unspecified affecting unspecified side: G81.90

## 2012-11-12 HISTORY — DX: Restless legs syndrome: G25.81

## 2012-11-12 NOTE — Progress Notes (Signed)
[facility] [address] [place]. [place] [address] [phone]       OFFICE FOLLOW-UP NOTE  [name] Date of Birth:  30-Nov-1955 Medical Record Number:  [id number]   HPI: 57 year old male with recurrent right brain TIA is since 10 years with vascular risk factors of diabetes, hypertension, hyperlipidemia, peripheral arterial disease and heart disease. Chronic insomnia been followed by Dr. Richardean Chimera.  11/12/2012 He is seen for followup today after last visit on 05/22/12. She has multiple complaints today. His not having any intercurrent stroke or TIA symptoms. He remains on aspirin and Plavix due to his cardiac stents and is tolerated well with only minor easy bleeding. He states his blood pressure and cholesterol have been under good control this as has been his sugars. He complains of increasing neck pain as well as back pain and intermittent left leg weakness. He states after his walk for about 5 minutes his left leg feels saline at times the knee gives out. He feels all right of his rested for a few minutes. He denies radicular pain but does complain of some dental pain in his nose back as well as the neck. He did have MRI scan of the neck and lumbar spine done in 2011 which had shown mild degenerative disc disease. He denies any recent fall, injury to his back or neck. He also complains of memory difficulties for the last month or 2 with trouble remembering recent information and tasks. He does have history of depression and has been started on Lexapro 10 mg by Dr. Timothy Lasso his primary physician. He is tolerating well without side effects but feels depression is not yet improved. He complains of cramps in his legs as well as restless legs at night. He sees Dr. Richardean Chimera   for his chronic sleep complaints. ROS:   14 system review of systems is positive for fatigue, chest pain, ringing in the ears, most, Hytrin, shortness of breath, snoring, importance, easy bleeding, joint pain,  can't completely muscles memory loss confusion headache, weakness dizziness, sleeping, snoring, restless legs, anxiety, too much sleep, decreased energy.  PMH:  Past Medical History  Diagnosis Date  . Hypertension   . Hyperlipidemia   . Myocardial infarction 2002  . COPD (chronic obstructive pulmonary disease)   . AAA (abdominal aortic aneurysm)   . Stroke   . Neuropathy   . CAD (coronary artery disease)   . Diabetes mellitus 2009  . Arthritis   . Joint pain   . Leg pain     with walking and pain in feet while lying flat  . Chest pain   . Shortness of breath 12/06/10    while lying flat and with exertion  . Reflux     Social History:  History   Social History  . Marital Status: Married    Spouse Name: N/A    Number of Children: N/A  . Years of Education: N/A   Occupational History  . Not on file.   Social History Main Topics  . Smoking status: Former Smoker -- 2.00 packs/day for 30 years    Types: Cigarettes    Quit date: 06/10/2000  . Smokeless tobacco: Never Used  . Alcohol Use: Yes     Comment: social  . Drug Use: No  . Sexually Active: Not on file   Other Topics Concern  . Not on file   Social History Narrative  . No narrative on file    Medications:   Current Outpatient Prescriptions  on File Prior to Visit  Medication Sig Dispense Refill  . aspirin 325 MG tablet Take 325 mg by mouth daily.        . clonazePAM (KLONOPIN) 1 MG tablet take 1 tablet by mouth at bedtime ,MAY USE A HALF AS NEEDED EXTRA. 7 DAYS A WEEK  30 tablet  5  . clopidogrel (PLAVIX) 75 MG tablet Take 75 mg by mouth daily.        . Cyanocobalamin (VITAMIN B-12 IJ) Inject as directed as directed. Inject every 2 minutes      . escitalopram (LEXAPRO) 10 MG tablet Take 10 mg by mouth daily.       [name] ezetimibe-simvastatin (VYTORIN) 10-40 MG per tablet Take 1 tablet by mouth at bedtime.        . fenofibrate 160 MG tablet Take 160 mg by mouth daily.       . furosemide (LASIX) 40 MG tablet Take  40 mg by mouth every other day.       [name] glimepiride (AMARYL) 4 MG tablet Take 4 mg by mouth daily.       . lansoprazole (PREVACID) 15 MG capsule Take 30 mg by mouth daily.       [name] losartan (COZAAR) 50 MG tablet Take 50 mg by mouth daily.        . metFORMIN (GLUCOPHAGE) 1000 MG tablet Take 1,000 mg by mouth. Take one tab in am and 1/2 tab at night      . metoprolol (LOPRESSOR) 50 MG tablet Take 50 mg by mouth 2 (two) times daily.       . nitroGLYCERIN (NITRODUR - DOSED IN MG/24 HR) 0.6 mg/hr Place 1 patch onto the skin daily.        . nitroGLYCERIN (NITROSTAT) 0.6 MG SL tablet Place 0.6 mg under the tongue every 5 (five) minutes as needed.        . NON FORMULARY Take 1 capsule by mouth daily. [employer] Research Medication      . pramipexole (MIRAPEX) 0.25 MG tablet Take 0.25 mg by mouth at bedtime.       . pregabalin (LYRICA) 100 MG capsule Take 100 mg by mouth 2 (two) times daily.        . ranolazine (RANEXA) 1000 MG SR tablet Take 1,000 mg by mouth 2 (two) times daily.        [name] terazosin (HYTRIN) 1 MG capsule Take 1 mg by mouth at bedtime.        [name] tiotropium (SPIRIVA) 18 MCG inhalation capsule Place 18 mcg into inhaler and inhale daily.       . VENTOLIN HFA 108 (90 BASE) MCG/ACT inhaler Inhale 1 puff into the lungs 2 (two) times daily as needed. For shortness of breath/difficulty breathing       No current facility-administered medications on file prior to visit.    Allergies:  No Known Allergies Filed Vitals:   11/12/12 1517  BP: 125/71  Pulse: 65  Temp: 97.5 F (36.4 C)    Physical Exam General: well developed, well nourished, seated, in no evident distress Head: head normocephalic and atraumatic. Orohparynx benign Neck: supple with no carotid or supraclavicular bruits Cardiovascular: regular rate and rhythm, no murmurs Musculoskeletal: no deformity Skin:  no rash/petichiae Vascular:  Normal pulses all extremities  Neurologic Exam Mental Status: Awake and fully alert.  Oriented to place and time. Recent and remote memory intact. Attention span, concentration and fund of knowledge appropriate. Mood and affect appropriate. Mini-Mental status exam  scored 26/30 with 1 deficit in orientation and 3 in attention. Animal naming test 16 which is normal. Genetic depression skill 7 consisted of depression. Clock drawing 4/4 which is normal. Cranial Nerves: Fundoscopic exam reveals sharp disc margins. Pupils equal, briskly reactive to light. Extraocular movements full without nystagmus. Visual fields full to confrontation. Hearing intact. Facial sensation intact. Face, tongue, palate moves normally and symmetrically.  Motor: Normal bulk and tone. Normal strength in all tested extremity muscles. Mild weakness of left foot great toe extensor and ankle dorsiflexors. Unable to walk on his heels without support Sensory.: Mildly diminished touch, pinprick and vibration sensation over the toes left greater than right feet. Coordination: Rapid alternating movements normal in all extremities. Finger-to-nose and heel-to-shin performed accurately bilaterally. Gait and Station: Arises from chair without difficulty. Stance is normal. Gait demonstrates normal stride length and balance . Unable to heel, toe and tandem walk without difficulty.  Reflexes: 1+ and symmetric except both ankle jerks are depressed. Toes downgoing.     ASSESSMENT:  57 year old male with recurrent right brain TIA is since 10 years with vascular risk factors of diabetes, hypertension, hyperlipidemia, peripheral arterial disease and heart disease. Chronic insomnia been followed by Dr. Richardean Chimera. Recurrent neck and back pain likely from degenerative spine disease. New complaints of intermittent left leg weakness worrisome for progressive degenerative spine disease which needs to be evaluated for. Recent new complaints of memory loss likely related to suboptimally treated depression.   PLAN: Continue aspirin and Plavix due  to his cardiac stents for stroke prevention and strict control of hypertension with blood pressure goal below 120/80, diabetes with hemoglobin A1c goal below 6.5%, lipids with LDL cholesterol goal below 100 mg percent. Check MRI scan of the cervical and lumbar spine for progressive degenerative spine disease given history of intermittent left leg weakness. Checking in general conduction study for underlying radiculopathy or neuropathy. Increase Lexapro to 20 mg daily for suboptimally treated depression and f/u with Dr Timothy Lasso for the same.. This was a prolonged visit requiring complex medical decision making of high severity and total time spent 60 minutes. Return for followup in 3 months with Lynn,NP

## 2012-11-12 NOTE — Patient Instructions (Addendum)
Continue Aspirin and Plavix due to cardiac stents for second stroke prevention. Strict control of diabetes with hemoglobin A1c goal below 6.5% and hypertension with blood pressure goal below 120/80. Check MRI scan of the lumbar and cervical spine and EMG nerve conduction study to evaluate left leg weakness. Follow up with Dr. Richardean Chimera for sleep related complaints and Larita Fife, NP in 3 months  increase Lexapro to 20 mg daily for his depression which may responsible for his memory loss.

## 2012-11-26 ENCOUNTER — Encounter (INDEPENDENT_AMBULATORY_CARE_PROVIDER_SITE_OTHER): Payer: 59

## 2012-11-26 ENCOUNTER — Ambulatory Visit (INDEPENDENT_AMBULATORY_CARE_PROVIDER_SITE_OTHER): Payer: 59 | Admitting: Neurology

## 2012-11-26 DIAGNOSIS — Z0289 Encounter for other administrative examinations: Secondary | ICD-10-CM

## 2012-11-26 DIAGNOSIS — R29898 Other symptoms and signs involving the musculoskeletal system: Secondary | ICD-10-CM

## 2012-11-26 DIAGNOSIS — R269 Unspecified abnormalities of gait and mobility: Secondary | ICD-10-CM

## 2012-11-26 NOTE — Procedures (Signed)
  HISTORY:  Christian Mccall is a 57 year old gentleman with a history of cerebrovascular disease and chronic low back pain. The patient is complaining of dragging the left leg while walking. The patient is being evaluated for a possible neuropathy or a lumbosacral radiculopathy. The patient denies any issues with the right leg.  NERVE CONDUCTION STUDIES:  Nerve conduction studies were performed on the right upper extremity. The distal motor latencies and motor amplitudes for the median and ulnar nerves were within normal limits. The F wave latencies and nerve conduction velocities for these nerves were also normal. The sensory latencies for the median and ulnar nerves were normal.  Nerve conduction studies were performed on both lower extremities. The distal motor latencies and motor amplitudes for the peroneal and posterior tibial nerves were within normal limits. The nerve conduction velocities for these nerves were also normal. The H reflex latencies were normal. The sensory latencies for the peroneal nerves were within normal limits.    EMG STUDIES:  EMG study was performed on the left lower extremity:  The tibialis anterior muscle reveals 2 to 4K motor units with full recruitment. No fibrillations or positive waves were seen. The peroneus tertius muscle reveals 2 to 4K motor units with full recruitment. No fibrillations or positive waves were seen. The medial gastrocnemius muscle reveals 1 to 3K motor units with full recruitment. No fibrillations or positive waves were seen. The vastus lateralis muscle reveals 2 to 4K motor units with full recruitment. No fibrillations or positive waves were seen. The iliopsoas muscle reveals 2 to 4K motor units with full recruitment. No fibrillations or positive waves were seen. The biceps femoris muscle (long head) reveals 2 to 4K motor units with full recruitment. No fibrillations or positive waves were seen. The lumbosacral paraspinal muscles were tested  at 3 levels, and revealed no abnormalities of insertional activity at all 3 levels tested. There was good relaxation.    IMPRESSION:  Nerve conduction studies done on both lower extremities and on the right upper extremity were unremarkable. There is no evidence of a neuropathy. EMG evaluation of the left lower extremity was unremarkable. No evidence of a left lumbosacral radiculopathy is seen.  Marlan Palau MD 11/26/2012 4:27 PM  [facility] [address] [place], [place] [address]  Phone [phone] Fax [phone]

## 2012-12-02 ENCOUNTER — Ambulatory Visit
Admission: RE | Admit: 2012-12-02 | Discharge: 2012-12-02 | Disposition: A | Discharge status: Home / Self Care | Payer: Medicare PPO | Source: Ambulatory Visit | Attending: Neurology | Admitting: Neurology

## 2012-12-02 DIAGNOSIS — M542 Cervicalgia: Secondary | ICD-10-CM

## 2012-12-02 DIAGNOSIS — M6281 Muscle weakness (generalized): Secondary | ICD-10-CM

## 2012-12-02 DIAGNOSIS — R29898 Other symptoms and signs involving the musculoskeletal system: Secondary | ICD-10-CM

## 2012-12-14 NOTE — Progress Notes (Signed)
Quick Note:  I called pt and also LMVM for triage nurse at Dr. Fabienne Bruns (VSS) re: below (Lumbar Spine MRI). ______

## 2013-02-01 ENCOUNTER — Other Ambulatory Visit: Payer: Self-pay | Admitting: Vascular Surgery

## 2013-02-01 LAB — CREATININE, SERUM: Creat: 1.01 mg/dL (ref 0.50–1.35)

## 2013-02-03 ENCOUNTER — Encounter: Payer: Self-pay | Admitting: Vascular Surgery

## 2013-02-04 ENCOUNTER — Ambulatory Visit
Admission: RE | Admit: 2013-02-04 | Discharge: 2013-02-04 | Disposition: A | Discharge status: Home / Self Care | Payer: Medicare PPO | Source: Ambulatory Visit | Attending: Vascular Surgery | Admitting: Vascular Surgery

## 2013-02-04 ENCOUNTER — Other Ambulatory Visit: Payer: Self-pay | Admitting: *Deleted

## 2013-02-04 ENCOUNTER — Encounter: Payer: Self-pay | Admitting: Vascular Surgery

## 2013-02-04 ENCOUNTER — Ambulatory Visit (INDEPENDENT_AMBULATORY_CARE_PROVIDER_SITE_OTHER): Payer: Medicare PPO | Admitting: Vascular Surgery

## 2013-02-04 VITALS — BP 145/73 | HR 68 | Resp 16 | Ht 71.0 in | Wt 225.0 lb

## 2013-02-04 DIAGNOSIS — R0600 Dyspnea, unspecified: Secondary | ICD-10-CM | POA: Insufficient documentation

## 2013-02-04 DIAGNOSIS — I714 Abdominal aortic aneurysm, without rupture, unspecified: Secondary | ICD-10-CM

## 2013-02-04 DIAGNOSIS — Z48812 Encounter for surgical aftercare following surgery on the circulatory system: Secondary | ICD-10-CM

## 2013-02-04 DIAGNOSIS — M79609 Pain in unspecified limb: Secondary | ICD-10-CM

## 2013-02-04 DIAGNOSIS — R0602 Shortness of breath: Secondary | ICD-10-CM

## 2013-02-04 DIAGNOSIS — R29898 Other symptoms and signs involving the musculoskeletal system: Secondary | ICD-10-CM

## 2013-02-04 HISTORY — DX: Dyspnea, unspecified: R06.00

## 2013-02-04 MED ORDER — IOHEXOL 350 MG/ML SOLN
80.0000 mL | Freq: Once | INTRAVENOUS | Status: AC | PRN
  Administered 2013-02-04: 80 mL via INTRAVENOUS

## 2013-02-04 NOTE — Progress Notes (Signed)
[facility] HISTORY AND PHYSICAL    History of Present Illness:  Patient is a 57 y.o. male who presents for follow-up evaluation of AAA. He underwent Gore Excluder aneurysm stent graft repair in August 2012. The patient denies new abdominal or back pain.  He has had episodes recently where pressure on his left abdomen caused him pain near his xiphoid region. This is currently being worked up by Dr. Timothy Lasso. It has been going on for one month. It is improving. The patient's atherosclerotic risk factors remain hypertension, hyperlipidemia, coronary disease, COPD, diabetes.  These are all currently stable and followed by his primary care physician. He has also noted recently that his arm blood pressure is different from one side to the other. This is asymptomatic with no dizziness or exertional fatigue. Advised that he should always have the blood pressure checked in a higher of the 2.  Past Medical History  Diagnosis Date  . Hypertension   . Hyperlipidemia   . Myocardial infarction 2002  . COPD (chronic obstructive pulmonary disease)   . AAA (abdominal aortic aneurysm)   . Neuropathy   . CAD (coronary artery disease)   . Diabetes mellitus 2009  . Arthritis   . Joint pain   . Leg pain     with walking and pain in feet while lying flat  . Chest pain   . Shortness of breath 12/06/10    while lying flat and with exertion  . Reflux   . Stroke June 2014    Mini  pt. had several small strokes     Past Surgical History  Procedure Laterality Date  . Coronary artery bypass graft  2002  . Abdominal aortic aneurysm repair  01-16-11    EVAR  . Abdominal aortic aneurysm repair         Review of Systems:  Neurologic: denies symptoms of TIA, amaurosis, or stroke Cardiac:denies shortness of breath or chest pain except with exertion Abdomen: denies abdominal pain nausea or vomiting  History   Social History  . Marital Status: Married    Spouse Name: N/A    Number  of Children: N/A  . Years of Education: N/A   Occupational History  . Not on file.   Social History Main Topics  . Smoking status: Former Smoker -- 2.00 packs/day for 30 years    Types: Cigarettes    Quit date: 06/10/2000  . Smokeless tobacco: Never Used  . Alcohol Use: Yes     Comment: social  . Drug Use: No  . Sexual Activity: Not on file   Other Topics Concern  . Not on file   Social History Narrative  . No narrative on file    No Known Allergies  Current Outpatient Prescriptions on File Prior to Visit  Medication Sig Dispense Refill  . ADVAIR DISKUS 100-50 MCG/DOSE AEPB       . aspirin 325 MG tablet Take 325 mg by mouth daily.        . clonazePAM (KLONOPIN) 1 MG tablet take 1 tablet by mouth at bedtime ,MAY USE A HALF AS NEEDED EXTRA. 7 DAYS A WEEK  30 tablet  5  . clopidogrel (PLAVIX) 75 MG tablet Take 75 mg by mouth daily.        . Cyanocobalamin (VITAMIN B-12 IJ) Inject as directed as directed. Inject every 2 minutes      . escitalopram (LEXAPRO) 10 MG tablet Take 20 mg by mouth daily.       [name]  ezetimibe-simvastatin (VYTORIN) 10-40 MG per tablet Take 1 tablet by mouth at bedtime.        . fenofibrate 160 MG tablet Take 160 mg by mouth daily.       . furosemide (LASIX) 40 MG tablet Take 40 mg by mouth every other day.       [name] glimepiride (AMARYL) 4 MG tablet Take 4 mg by mouth daily.       . lansoprazole (PREVACID) 15 MG capsule Take 30 mg by mouth daily.       [name] losartan (COZAAR) 50 MG tablet Take 50 mg by mouth daily.        . metFORMIN (GLUCOPHAGE) 1000 MG tablet Take 1,000 mg by mouth. Take one tab in am and 1/2 tab at night      . metoprolol (LOPRESSOR) 50 MG tablet Take 50 mg by mouth 2 (two) times daily.       . nitroGLYCERIN (NITRODUR - DOSED IN MG/24 HR) 0.6 mg/hr Place 1 patch onto the skin daily.        . nitroGLYCERIN (NITROSTAT) 0.6 MG SL tablet Place 0.6 mg under the tongue every 5 (five) minutes as needed.        . NON FORMULARY Take 1 capsule by mouth  daily. Pharm [employer] Research Medication      . pramipexole (MIRAPEX) 0.25 MG tablet Take 0.25 mg by mouth at bedtime.       . pregabalin (LYRICA) 100 MG capsule Take 100 mg by mouth. One Tab am and two Tabs pm      . ranolazine (RANEXA) 1000 MG SR tablet Take 1,000 mg by mouth 2 (two) times daily.        [name] terazosin (HYTRIN) 1 MG capsule Take 1 mg by mouth at bedtime.        [name] tiotropium (SPIRIVA) 18 MCG inhalation capsule Place 18 mcg into inhaler and inhale daily.       . VENTOLIN HFA 108 (90 BASE) MCG/ACT inhaler Inhale 1 puff into the lungs 2 (two) times daily as needed. For shortness of breath/difficulty breathing      . cefdinir (OMNICEF) 300 MG capsule        No current facility-administered medications on file prior to visit.       Physical Examination    Filed Vitals:   02/04/13 1322  BP: 145/73  Pulse: 68  Resp: 16  Height: 5\' 11"  (1.803 m)  Weight: 225 lb (102.059 kg)  SpO2: 95%     General:  Alert and oriented, no acute distress HEENT: Normal Neck: No bruit or JVD Pulmonary: Clear to auscultation bilaterally Cardiac: Regular Rate and Rhythm without murmur Abdomen: Soft, non-tender, non-distended, normal bowel sounds, no pulsatile mass, obese Extremities: 2+ femoral pulses   DATA:  CT angiogram the abdomen and pelvis images were reviewed today. This is from a CT scan from earlier today at [facility] imaging. Current aneurysm diameter is  5.5.  There no evidence of endoleak. Previously seen type II endoleak is not visualized today. The top portion of the stent graft is adjacent to the renal arteries and there is no evidence of migration. No obvious cause for his left sided chest pain.   ASSESSMENT:  Doing well status post Gore Excluder stent graft repair aneurysm   PLAN: Patient will return in one year to review his stent graft ultrasound in the office at that point. He will continue followup as needed with Dr. Timothy Lasso regarding the chest pain.  Fabienne Bruns,  MD [facility] Office: [phone] Pager: [phone]

## 2013-02-15 ENCOUNTER — Encounter: Payer: Self-pay | Admitting: Nurse Practitioner

## 2013-02-15 ENCOUNTER — Ambulatory Visit (INDEPENDENT_AMBULATORY_CARE_PROVIDER_SITE_OTHER): Payer: 59 | Admitting: Nurse Practitioner

## 2013-02-15 VITALS — BP 110/63 | HR 69 | Temp 98.7°F | Ht 72.0 in | Wt 235.0 lb

## 2013-02-15 DIAGNOSIS — G8190 Hemiplegia, unspecified affecting unspecified side: Secondary | ICD-10-CM

## 2013-02-15 DIAGNOSIS — R269 Unspecified abnormalities of gait and mobility: Secondary | ICD-10-CM

## 2013-02-15 DIAGNOSIS — R413 Other amnesia: Secondary | ICD-10-CM

## 2013-02-15 DIAGNOSIS — G459 Transient cerebral ischemic attack, unspecified: Secondary | ICD-10-CM

## 2013-02-15 DIAGNOSIS — M549 Dorsalgia, unspecified: Secondary | ICD-10-CM

## 2013-02-15 NOTE — Patient Instructions (Signed)
Continue current medications.  Return for follow up in 3 months.

## 2013-02-15 NOTE — Progress Notes (Signed)
[facility]  PATIENT: Christian Mccall DOB: September 23, 1955   REASON FOR VISIT: follow up HISTORY FROM: patient   HISTORY OF PRESENT ILLNESS:  PRIOR HPI: 57 year old male with recurrent right brain TIA is since 10 years with vascular risk factors of diabetes, hypertension, hyperlipidemia, peripheral arterial disease and heart disease. Chronic insomnia been followed by Dr. Vickey Huger.   11/12/2012 He is seen for followup today after last visit on 05/22/12. He has multiple complaints today. He is not having any intercurrent stroke or TIA symptoms. He remains on aspirin and Plavix due to his cardiac stents and is tolerated well with only minor easy bleeding. He states his blood pressure and cholesterol have been under good control this as has been his sugars. He complains of increasing neck pain as well as back pain and intermittent left leg weakness. He states after his walk for about 5 minutes his left leg feels saline at times the knee gives out. He feels all right after he has rested for a few minutes. He denies radicular pain but does complain of some dental pain in his nose back as well as the neck. He did have MRI scan of the neck and lumbar spine done in 2011 which had shown mild degenerative disc disease. He denies any recent fall, injury to his back or neck. He also complains of memory difficulties for the last month or 2 with trouble remembering recent information and tasks. He does have history of depression and has been started on Lexapro 10 mg by Dr. Timothy Lasso his primary physician. He is tolerating well without side effects but feels depression is not yet improved. He complains of cramps in his legs as well as restless legs at night. He sees Dr. Richardean Chimera for his chronic sleep complaints.   UPDATE 02/15/13 (LL):  Patient returns to office for MRI and NCV/EMG results.  MRIs were unchanged from previous studies and NCV/EMG were normal.    He reports that sometimes he has low back pain  and uses Lidoderm patch which provides some relief.  He reports he has had TIAs, 2 in the last month, with left sides facial droop and left arm weakness which resolved with 2 hours.  He had a recent follow up with Dr. Darrick Penna and his AAA graft is stable.  He reports better mood since the Lexapro dose was increased.  He reports he is on three different inhalers now and feels like he cannot get enough air in at times.  Dr. Timothy Lasso is managing his respiratory care.  He remains on aspirin and Plavix due to his cardiac stents and is tolerated well with only minor easy bleeding. He states his blood pressure and cholesterol have been under good control this as has been his sugars.  He reports he tries to stay active, even though he cannot do very much activity without having to rest.  REVIEW OF SYSTEMS: Full 14 system review of systems performed and notable only for:  Constitutional: N/A  Cardiovascular: chest pain  Ear/Nose/Throat: N/A  Skin: N/A  Eyes: N/A  Respiratory: short of breath Gastroitestinal: N/A  Genitourinary: N/A Hematology/Lymphatic: N/A  Endocrine: feeling hot Musculoskeletal:N/A  Allergy/Immunology: N/A  Neurological: memory loss, headache Psychiatric: too much sleep Sleep: snoring, restless legs   ALLERGIES: No Known Allergies  HOME MEDICATIONS: Outpatient Prescriptions Prior to Visit  Medication Sig Dispense Refill  . ADVAIR DISKUS 100-50 MCG/DOSE AEPB       . aspirin 325 MG tablet Take 325 mg by mouth daily.        [name]  cefdinir (OMNICEF) 300 MG capsule       . clonazePAM (KLONOPIN) 1 MG tablet take 1 tablet by mouth at bedtime ,MAY USE A HALF AS NEEDED EXTRA. 7 DAYS A WEEK  30 tablet  5  . clopidogrel (PLAVIX) 75 MG tablet Take 75 mg by mouth daily.        . Cyanocobalamin (VITAMIN B-12 IJ) Inject as directed as directed. Inject every 2 minutes      . escitalopram (LEXAPRO) 10 MG tablet Take 20 mg by mouth daily.       [name] ezetimibe-simvastatin (VYTORIN) 10-40 MG per tablet  Take 1 tablet by mouth at bedtime.        . fenofibrate 160 MG tablet Take 160 mg by mouth daily.       . furosemide (LASIX) 40 MG tablet Take 40 mg by mouth every other day.       [name] glimepiride (AMARYL) 4 MG tablet Take 4 mg by mouth daily.       . lansoprazole (PREVACID) 15 MG capsule Take 30 mg by mouth daily.       [name] losartan (COZAAR) 50 MG tablet Take 50 mg by mouth daily.        . metFORMIN (GLUCOPHAGE) 1000 MG tablet Take 1,000 mg by mouth. Take one tab in am and 1/2 tab at night      . metoprolol (LOPRESSOR) 50 MG tablet Take 50 mg by mouth 2 (two) times daily.       . nitroGLYCERIN (NITRODUR - DOSED IN MG/24 HR) 0.6 mg/hr Place 1 patch onto the skin daily.        . nitroGLYCERIN (NITROSTAT) 0.6 MG SL tablet Place 0.6 mg under the tongue every 5 (five) minutes as needed.        . NON FORMULARY Take 1 capsule by mouth daily. [employer] Medication      . pramipexole (MIRAPEX) 0.25 MG tablet Take 0.25 mg by mouth at bedtime.       . pregabalin (LYRICA) 100 MG capsule Take 100 mg by mouth. One Tab am and two Tabs pm      . ranolazine (RANEXA) 1000 MG SR tablet Take 1,000 mg by mouth 2 (two) times daily.        [name] terazosin (HYTRIN) 1 MG capsule Take 1 mg by mouth at bedtime.        [name] tiotropium (SPIRIVA) 18 MCG inhalation capsule Place 18 mcg into inhaler and inhale daily.       . VENTOLIN HFA 108 (90 BASE) MCG/ACT inhaler Inhale 1 puff into the lungs 2 (two) times daily as needed. For shortness of breath/difficulty breathing       No facility-administered medications prior to visit.    PAST MEDICAL HISTORY: Past Medical History  Diagnosis Date  . Hypertension   . Hyperlipidemia   . Myocardial infarction 2002  . COPD (chronic obstructive pulmonary disease)   . AAA (abdominal aortic aneurysm)   . Neuropathy   . CAD (coronary artery disease)   . Diabetes mellitus 2009  . Arthritis   . Joint pain   . Leg pain     with walking and pain in feet while lying flat  . Chest  pain   . Shortness of breath 12/06/10    while lying flat and with exertion  . Reflux   . Stroke June 2014    Mini  pt. had several small strokes    PAST SURGICAL HISTORY: Past Surgical History  Procedure Laterality Date  . Coronary artery bypass graft  2002  . Abdominal aortic aneurysm repair  01-16-11    EVAR  . Abdominal aortic aneurysm repair      FAMILY HISTORY: Family History  Problem Relation Age of Onset  . Aneurysm Mother     AAA  . Heart disease Mother     Heart Disease before age 85  . Other Mother     Carotid artery stenosis  . Hyperlipidemia Mother   . Hypertension Mother   . Aneurysm Maternal Aunt     AAA  . Other Maternal Aunt     AAA  . Aneurysm Maternal Uncle     AAA  . Other Maternal Uncle     AAA  . Lupus Sister   . Heart disease Brother     Heart Disease before age 79  . Hypertension Brother   . Heart disease Brother   . Heart attack Brother   . Heart attack Brother     SOCIAL HISTORY: History   Social History  . Marital Status: Married    Spouse Name: N/A    Number of Children: N/A  . Years of Education: N/A   Occupational History  . Not on file.   Social History Main Topics  . Smoking status: Former Smoker -- 2.00 packs/day for 30 years    Types: Cigarettes    Quit date: 06/10/2000  . Smokeless tobacco: Never Used  . Alcohol Use: Yes     Comment: social  . Drug Use: No  . Sexual Activity: Not on file   Other Topics Concern  . Not on file   Social History Narrative  . No narrative on file     PHYSICAL EXAM  Filed Vitals:   02/15/13 1515  BP: 110/63  Pulse: 69  Temp: 98.7 F (37.1 C)  TempSrc: Oral  Height: 6' (1.829 m)  Weight: 235 lb (106.595 kg)   Body mass index is 31.86 kg/(m^2).  Generalized: Well developed, in no acute distress, obese Caucasian male Head: normocephalic and atraumatic. Oropharynx benign  Neck: Supple, no carotid bruits  Cardiac: Regular rate rhythm, no murmur  Musculoskeletal: No  deformity   Neurologic Exam  Mental Status: Awake and fully alert. Oriented to place and time. Recent and remote memory intact. Attention span, concentration and fund of knowledge appropriate. Mood and affect appropriate. (Previous visit Mini-Mental status exam scored 26/30 with 1 deficit in orientation and 3 in attention. Animal naming test 16 which is normal. Genetic depression skill 7 consisted of depression. Clock drawing 4/4 which is normal.) Cranial Nerves: Fundoscopic exam reveals sharp disc margins. Pupils equal, briskly reactive to light. Extraocular movements full without nystagmus. Visual fields full to confrontation. Hearing intact. Facial sensation intact. Face, tongue, palate moves normally and symmetrically.  Motor: Normal bulk and tone. Normal strength in all tested extremity muscles. Mild weakness of left foot great toe extensor and ankle dorsiflexors. Unable to walk on his heels without support  Sensory.: Mildly diminished touch, pinprick and vibration sensation over the toes left greater than right feet.  Coordination: Rapid alternating movements normal in all extremities. Finger-to-nose and heel-to-shin performed accurately bilaterally.  Gait and Station: Arises from chair without difficulty. Stance is normal. Gait demonstrates normal stride length and balance . Unable to heel, toe and tandem walk without difficulty.  Reflexes: 1+ and symmetric except both ankle jerks are depressed. Toes downgoing.    DIAGNOSTIC DATA (LABS, IMAGING, TESTING) - I reviewed patient records, labs, notes,  testing and imaging myself where available.  Lab Results  Component Value Date   WBC 5.7 05/31/2012   HGB 14.3 05/31/2012   HCT 40.5 05/31/2012   MCV 89.8 05/31/2012   PLT 91* 05/31/2012      Component Value Date/Time   NA 136 05/31/2012 1520   K 4.0 05/31/2012 1520   CL 100 05/31/2012 1520   CO2 25 05/31/2012 1520   GLUCOSE 249* 05/31/2012 1520   BUN 10 02/01/2013 1210   CREATININE 1.01  02/01/2013 1210   CREATININE 0.90 05/31/2012 1520   CALCIUM 9.6 05/31/2012 1520   PROT 6.2 01/24/2011 0759   ALBUMIN 3.2* 01/24/2011 0759   AST 21 01/24/2011 0759   ALT 17 01/24/2011 0759   ALKPHOS 39 01/24/2011 0759   BILITOT 0.5 01/24/2011 0759   GFRNONAA >90 05/31/2012 1520   GFRAA >90 05/31/2012 1520   11/26/12 EMG/NCV  Nerve conduction studies done on both lower extremities and on the right upper extremity were unremarkable. There is no evidence of a neuropathy. EMG evaluation of the left lower extremity was unremarkable. No evidence of a left lumbosacral radiculopathy is seen. 11/12/12  MRI cervical spine (without)  Mildly abnormal MRI cervical spine (without) demonstrating disc bulging at C4-5, C5-6 and C6 7. No spinal stenosis or foraminal narrowing. No change from MRI on 03/12/12. 11/12/12 MRI lumbar spine (without)  Abnormal MRI lumbar spine (without) demonstrating: Minimal degenerative spondylosis L1-2 down to L5-S1. No change from last MRI on 03/05/10. Enlarged aorta (5.1cm) status post endograft repair (per history), which is a new finding from last MRI on 03/05/10.  ASSESSMENT:  57 year old male with recurrent right brain TIA is since 10 years with vascular risk factors of diabetes, hypertension, hyperlipidemia, long smoking history, peripheral arterial disease and heart disease. Chronic insomnia been followed by Dr. Richardean Chimera. Recurrent neck and back pain from degenerative spine disease, but no further progression on repeat scans. Depression under better control.   PLAN:  Continue aspirin and Plavix due to his cardiac stents for stroke prevention and strict control of hypertension with blood pressure goal below 120/80, diabetes with hemoglobin A1c goal below 6.5%, lipids with LDL cholesterol goal below 100 mg percent.  Continue current dose of Lexapro. Return for followup in 3 months with Eaton Folmar,NP  Tawny Asal Tylin Stradley, MSN, NP-C 02/15/2013, 4:48 PM [facility] [address]  [address] [place], [place] [address] [phone]

## 2013-03-25 ENCOUNTER — Other Ambulatory Visit: Payer: Self-pay | Admitting: Neurology

## 2013-03-26 NOTE — Telephone Encounter (Signed)
Rx signed and faxed.

## 2013-04-19 ENCOUNTER — Telehealth: Payer: Self-pay | Admitting: Oncology

## 2013-04-19 NOTE — Telephone Encounter (Signed)
C/D 04/19/13 for appt. 05/07/13

## 2013-04-19 NOTE — Telephone Encounter (Signed)
S/W PT AND GVE NP APPT 11/28 @ 1:30 W/DR. SHADAD REFERRING DR. Jonny Ruiz RUSSO DX- THROMBOCYTOPENIA WELCOME PACKET MAILED.

## 2013-04-30 ENCOUNTER — Other Ambulatory Visit: Payer: Self-pay | Admitting: Oncology

## 2013-04-30 DIAGNOSIS — D696 Thrombocytopenia, unspecified: Secondary | ICD-10-CM

## 2013-05-01 ENCOUNTER — Inpatient Hospital Stay (HOSPITAL_COMMUNITY)
Admission: EM | Admit: 2013-05-01 | Discharge: 2013-05-03 | DRG: 313 | Disposition: A | Discharge status: Home / Self Care | Payer: 59 | Attending: Interventional Cardiology | Admitting: Interventional Cardiology

## 2013-05-01 ENCOUNTER — Encounter (HOSPITAL_COMMUNITY): Payer: Self-pay | Admitting: Emergency Medicine

## 2013-05-01 ENCOUNTER — Emergency Department (HOSPITAL_COMMUNITY): Payer: 59

## 2013-05-01 DIAGNOSIS — R079 Chest pain, unspecified: Secondary | ICD-10-CM | POA: Diagnosis present

## 2013-05-01 DIAGNOSIS — I252 Old myocardial infarction: Secondary | ICD-10-CM

## 2013-05-01 DIAGNOSIS — R0600 Dyspnea, unspecified: Secondary | ICD-10-CM | POA: Diagnosis present

## 2013-05-01 DIAGNOSIS — E785 Hyperlipidemia, unspecified: Secondary | ICD-10-CM | POA: Diagnosis present

## 2013-05-01 DIAGNOSIS — I200 Unstable angina: Secondary | ICD-10-CM

## 2013-05-01 DIAGNOSIS — I10 Essential (primary) hypertension: Secondary | ICD-10-CM

## 2013-05-01 DIAGNOSIS — R0602 Shortness of breath: Secondary | ICD-10-CM | POA: Diagnosis present

## 2013-05-01 DIAGNOSIS — M129 Arthropathy, unspecified: Secondary | ICD-10-CM | POA: Diagnosis present

## 2013-05-01 DIAGNOSIS — Z9861 Coronary angioplasty status: Secondary | ICD-10-CM

## 2013-05-01 DIAGNOSIS — G589 Mononeuropathy, unspecified: Secondary | ICD-10-CM | POA: Diagnosis present

## 2013-05-01 DIAGNOSIS — Z951 Presence of aortocoronary bypass graft: Secondary | ICD-10-CM

## 2013-05-01 DIAGNOSIS — Z7982 Long term (current) use of aspirin: Secondary | ICD-10-CM

## 2013-05-01 DIAGNOSIS — I714 Abdominal aortic aneurysm, without rupture, unspecified: Secondary | ICD-10-CM

## 2013-05-01 DIAGNOSIS — Z79899 Other long term (current) drug therapy: Secondary | ICD-10-CM

## 2013-05-01 DIAGNOSIS — R0789 Other chest pain: Secondary | ICD-10-CM | POA: Diagnosis present

## 2013-05-01 DIAGNOSIS — I739 Peripheral vascular disease, unspecified: Secondary | ICD-10-CM | POA: Diagnosis present

## 2013-05-01 DIAGNOSIS — E119 Type 2 diabetes mellitus without complications: Secondary | ICD-10-CM | POA: Diagnosis present

## 2013-05-01 DIAGNOSIS — Z8249 Family history of ischemic heart disease and other diseases of the circulatory system: Secondary | ICD-10-CM

## 2013-05-01 DIAGNOSIS — D696 Thrombocytopenia, unspecified: Secondary | ICD-10-CM | POA: Diagnosis present

## 2013-05-01 DIAGNOSIS — I498 Other specified cardiac arrhythmias: Secondary | ICD-10-CM | POA: Diagnosis present

## 2013-05-01 DIAGNOSIS — E1151 Type 2 diabetes mellitus with diabetic peripheral angiopathy without gangrene: Secondary | ICD-10-CM

## 2013-05-01 DIAGNOSIS — Z8673 Personal history of transient ischemic attack (TIA), and cerebral infarction without residual deficits: Secondary | ICD-10-CM

## 2013-05-01 DIAGNOSIS — Z87891 Personal history of nicotine dependence: Secondary | ICD-10-CM

## 2013-05-01 DIAGNOSIS — J4489 Other specified chronic obstructive pulmonary disease: Secondary | ICD-10-CM | POA: Diagnosis present

## 2013-05-01 DIAGNOSIS — I2510 Atherosclerotic heart disease of native coronary artery without angina pectoris: Secondary | ICD-10-CM | POA: Diagnosis present

## 2013-05-01 DIAGNOSIS — J449 Chronic obstructive pulmonary disease, unspecified: Secondary | ICD-10-CM | POA: Diagnosis present

## 2013-05-01 DIAGNOSIS — K219 Gastro-esophageal reflux disease without esophagitis: Secondary | ICD-10-CM | POA: Diagnosis present

## 2013-05-01 LAB — BASIC METABOLIC PANEL
BUN: 14 mg/dL (ref 6–23)
CO2: 26 mEq/L (ref 19–32)
Calcium: 9.6 mg/dL (ref 8.4–10.5)
Creatinine, Ser: 1.16 mg/dL (ref 0.50–1.35)
GFR calc non Af Amer: 69 mL/min — ABNORMAL LOW (ref >90)
Glucose, Bld: 238 mg/dL — ABNORMAL HIGH (ref 70–99)
Potassium: 4.5 mEq/L (ref 3.5–5.1)

## 2013-05-01 LAB — POCT I-STAT TROPONIN I

## 2013-05-01 LAB — CBC
HCT: 38.8 % — ABNORMAL LOW (ref 39.0–52.0)
Hemoglobin: 13.2 g/dL (ref 13.0–17.0)
MCH: 30.8 pg (ref 26.0–34.0)
MCHC: 34 g/dL (ref 30.0–36.0)

## 2013-05-01 MED ORDER — ONDANSETRON HCL 4 MG/2ML IJ SOLN: 4.0000 mg | Freq: Four times a day (QID) | INTRAMUSCULAR | Status: DC | PRN

## 2013-05-01 MED ORDER — ASPIRIN 300 MG RE SUPP: 300.0000 mg | RECTAL | Status: AC

## 2013-05-01 MED ORDER — ACETAMINOPHEN 325 MG PO TABS: 650.0000 mg | ORAL_TABLET | ORAL | Status: DC | PRN

## 2013-05-01 MED ORDER — MORPHINE SULFATE 4 MG/ML IJ SOLN
4.0000 mg | Freq: Once | INTRAMUSCULAR | Status: AC
  Administered 2013-05-01: 4 mg via INTRAVENOUS
  Filled 2013-05-01: qty 1

## 2013-05-01 MED ORDER — MORPHINE SULFATE 2 MG/ML IJ SOLN
2.0000 mg | Freq: Once | INTRAMUSCULAR | Status: DC
  Filled 2013-05-01: qty 1

## 2013-05-01 MED ORDER — ASPIRIN EC 81 MG PO TBEC
81.0000 mg | DELAYED_RELEASE_TABLET | Freq: Every day | ORAL | Status: DC
  Administered 2013-05-02 – 2013-05-03 (×2): 81 mg via ORAL
  Filled 2013-05-01 (×2): qty 1

## 2013-05-01 MED ORDER — NITROGLYCERIN 0.4 MG SL SUBL
0.4000 mg | SUBLINGUAL_TABLET | SUBLINGUAL | Status: DC | PRN
  Administered 2013-05-02 (×2): 0.4 mg via SUBLINGUAL
  Filled 2013-05-01 (×2): qty 25

## 2013-05-01 MED ORDER — HEPARIN (PORCINE) IN NACL 100-0.45 UNIT/ML-% IJ SOLN
1300.0000 [IU]/h | INTRAMUSCULAR | Status: DC
  Administered 2013-05-02: 1300 [IU]/h via INTRAVENOUS
  Filled 2013-05-01 (×2): qty 250

## 2013-05-01 MED ORDER — CLOPIDOGREL BISULFATE 300 MG PO TABS
600.0000 mg | ORAL_TABLET | Freq: Once | ORAL | Status: AC
  Administered 2013-05-02: 600 mg via ORAL
  Filled 2013-05-01: qty 2

## 2013-05-01 MED ORDER — ASPIRIN 81 MG PO CHEW: 324.0000 mg | CHEWABLE_TABLET | ORAL | Status: AC

## 2013-05-01 MED ORDER — NITROGLYCERIN IN D5W 200-5 MCG/ML-% IV SOLN
3.0000 ug/min | INTRAVENOUS | Status: DC
Start: 2013-05-01 — End: 2013-05-02
  Administered 2013-05-01: 5 ug/min via INTRAVENOUS
  Filled 2013-05-01: qty 250

## 2013-05-01 MED ORDER — ASPIRIN 325 MG PO TABS
325.0000 mg | ORAL_TABLET | Freq: Once | ORAL | Status: AC
  Administered 2013-05-01: 325 mg via ORAL
  Filled 2013-05-01: qty 1

## 2013-05-01 NOTE — Progress Notes (Signed)
ANTICOAGULATION CONSULT NOTE - Initial Consult  Pharmacy Consult for heparin Indication: chest pain/ACS  Allergies  Allergen Reactions  . Advair Diskus [Fluticasone-Salmeterol] Shortness Of Breath    Patient had a one time occurrence of shortness of breath and states that he doesn't think that he has a true allergy to med-has been taking med for years.     Patient Measurements: Height: 6' (182.9 cm) Weight: 236 lb 1.6 oz (107.094 kg) IBW/kg (Calculated) : 77.6 Heparin Dosing Weight: 100kg  Vital Signs: Temp: 98 F (36.7 C) (11/22 2002) Temp src: Oral (11/22 2002) BP: 139/59 mmHg (11/22 2300) Pulse Rate: 64 (11/22 2300)  Labs:  Recent Labs  05/01/13 2000  HGB 13.2  HCT 38.8*  PLT 78*  CREATININE 1.16    Estimated Creatinine Clearance: 89.9 ml/min (by C-G formula based on Cr of 1.16).   Medical History: Past Medical History  Diagnosis Date  . Hypertension   . Hyperlipidemia   . Myocardial infarction 2002  . COPD (chronic obstructive pulmonary disease)   . AAA (abdominal aortic aneurysm)   . Neuropathy   . CAD (coronary artery disease)   . Diabetes mellitus 2009  . Arthritis   . Joint pain   . Leg pain     with walking and pain in feet while lying flat  . Chest pain   . Shortness of breath 12/06/10    while lying flat and with exertion  . Reflux   . Stroke June 2014    Mini  pt. had several small strokes    Assessment: 57yo male w/ CAD c/o non-radiating CP/pressure despite nitro patch, relieved w/ SL NTG x3, to begin heparin.  Goal of Therapy:  Heparin level 0.3-0.7 units/ml Monitor platelets by anticoagulation protocol: Yes   Plan:  Will begin heparin gtt at 1300 units/hr (without bolus given low plt and CVA in June) and monitor heparin levels and CBC.  Vernard Gambles, PharmD, BCPS  05/01/2013,11:24 PM

## 2013-05-01 NOTE — ED Notes (Signed)
Pt states he self administered 324 ASA at 1030 am, and that this feels similar to past MI. Hx of MI x 6

## 2013-05-01 NOTE — ED Notes (Signed)
Pt states left sided non radiating CP that feels like pressure. Pain relived by nitro SL x 3. Pt also wears a nitro patch currently located under right chest.

## 2013-05-01 NOTE — ED Provider Notes (Signed)
CSN: [id number]     Arrival date & time 05/01/13  1947 History   First MD Initiated Contact with Patient 05/01/13 2046     Chief Complaint  Patient presents with  . Chest Pain   (Consider location/radiation/quality/duration/timing/severity/associated sxs/prior Treatment) Patient is a 57 y.o. male presenting with chest pain.  Chest Pain Associated symptoms: no abdominal pain, no cough, no fever, no headache, no nausea, no palpitations and not vomiting    Christian Mccall is a 57 y.o. male who presents to the emergency department with chest pain.  Started 6 hours PTA.  Described as tightness.  Worst over L side and radiating to R side.  Associated with some SOB.  Worse with any exertion.  Better with 4 sublingual nitroglycerine pills.  Not described as ripping or tearing.  No history of PE, not pleuritic, and no unilateral leg swelling.  No fevers.  No cough.  No other symptoms.  Past Medical History  Diagnosis Date  . Hypertension   . Hyperlipidemia   . Myocardial infarction 2002  . COPD (chronic obstructive pulmonary disease)   . AAA (abdominal aortic aneurysm)   . Neuropathy   . CAD (coronary artery disease)   . Diabetes mellitus 2009  . Arthritis   . Joint pain   . Leg pain     with walking and pain in feet while lying flat  . Chest pain   . Shortness of breath 12/06/10    while lying flat and with exertion  . Reflux   . Stroke June 2014    Mini  pt. had several small strokes   Past Surgical History  Procedure Laterality Date  . Coronary artery bypass graft  2002  . Abdominal aortic aneurysm repair  01-16-11    EVAR  . Abdominal aortic aneurysm repair     Family History  Problem Relation Age of Onset  . Aneurysm Mother     AAA  . Heart disease Mother     Heart Disease before age 72  . Other Mother     Carotid artery stenosis  . Hyperlipidemia Mother   . Hypertension Mother   . Aneurysm Maternal Aunt     AAA  . Other Maternal Aunt     AAA  . Aneurysm Maternal  Uncle     AAA  . Other Maternal Uncle     AAA  . Lupus Sister   . Heart disease Brother     Heart Disease before age 14  . Hypertension Brother   . Heart disease Brother   . Heart attack Brother   . Heart attack Brother    History  Substance Use Topics  . Smoking status: Former Smoker -- 2.00 packs/day for 30 years    Types: Cigarettes    Quit date: 06/10/2000  . Smokeless tobacco: Never Used  . Alcohol Use: Yes     Comment: social    Review of Systems  Constitutional: Negative for fever and chills.  HENT: Negative for congestion and sore throat.   Respiratory: Negative for cough.   Cardiovascular: Positive for chest pain. Negative for palpitations and leg swelling.  Gastrointestinal: Negative for nausea, vomiting, abdominal pain, diarrhea and constipation.  Endocrine: Negative for polyuria.  Genitourinary: Negative for dysuria and hematuria.  Musculoskeletal: Negative for neck pain.  Skin: Negative for rash.  Neurological: Negative for headaches.  Psychiatric/Behavioral: Negative.   All other systems reviewed and are negative.    Allergies  Advair diskus  Home Medications  Current Outpatient Rx  Name  Route  Sig  Dispense  Refill  . ADVAIR DISKUS 100-50 MCG/DOSE AEPB   Inhalation   Inhale 1 puff into the lungs every 12 (twelve) hours.          [name] aspirin 325 MG tablet   Oral   Take 325 mg by mouth daily.           . cholecalciferol (VITAMIN D) 1000 UNITS tablet   Oral   Take 1,000 Units by mouth daily.         . clonazePAM (KLONOPIN) 1 MG tablet   Oral   Take 1 mg by mouth at bedtime.         . clopidogrel (PLAVIX) 75 MG tablet   Oral   Take 75 mg by mouth daily.           . cyanocobalamin (,VITAMIN B-12,) 1000 MCG/ML injection   Intramuscular   Inject 1,000 mcg into the muscle every 3 (three) months.         . escitalopram (LEXAPRO) 20 MG tablet   Oral   Take 20 mg by mouth at bedtime.         [name] ezetimibe-simvastatin (VYTORIN)  10-40 MG per tablet   Oral   Take 1 tablet by mouth at bedtime.           . fenofibrate 160 MG tablet   Oral   Take 160 mg by mouth daily.          . furosemide (LASIX) 40 MG tablet   Oral   Take 40 mg by mouth every other day.          [name] glimepiride (AMARYL) 4 MG tablet   Oral   Take 4 mg by mouth at bedtime.          . lansoprazole (PREVACID) 15 MG capsule   Oral   Take 30 mg by mouth daily.          [name] losartan (COZAAR) 50 MG tablet   Oral   Take 50 mg by mouth daily.           . metFORMIN (GLUCOPHAGE) 1000 MG tablet   Oral   Take 500-1,000 mg by mouth 2 (two) times daily with a meal. Take one tab in am and 1/2 tab at night         . metoprolol (LOPRESSOR) 50 MG tablet   Oral   Take 50 mg by mouth 2 (two) times daily.          . nitroGLYCERIN (NITRODUR - DOSED IN MG/24 HR) 0.6 mg/hr   Transdermal   Place 1 patch onto the skin daily.           . nitroGLYCERIN (NITROSTAT) 0.6 MG SL tablet   Sublingual   Place 0.6 mg under the tongue every 5 (five) minutes as needed for chest pain.          . NON FORMULARY   Oral   Take 1 capsule by mouth daily. [employer] Medication         . pramipexole (MIRAPEX) 0.25 MG tablet   Oral   Take 0.25 mg by mouth at bedtime.          . pregabalin (LYRICA) 100 MG capsule   Oral   Take 100-200 mg by mouth 2 (two) times daily. One Tab am and two Tabs pm         . ranolazine (RANEXA) 1000 MG SR  tablet   Oral   Take 1,000 mg by mouth 2 (two) times daily.           [name] terazosin (HYTRIN) 1 MG capsule   Oral   Take 1 mg by mouth at bedtime.           [name] tiotropium (SPIRIVA) 18 MCG inhalation capsule   Inhalation   Place 18 mcg into inhaler and inhale daily.          . VENTOLIN HFA 108 (90 BASE) MCG/ACT inhaler   Inhalation   Inhale 1 puff into the lungs 2 (two) times daily as needed. For shortness of breath/difficulty breathing          BP 139/59  Pulse 64  Temp(Src) 98 F (36.7 C)  (Oral)  Resp 16  Wt 236 lb 1.6 oz (107.094 kg)  SpO2 96% Physical Exam  Nursing note and vitals reviewed. Constitutional: He is oriented to person, place, and time. He appears well-developed and well-nourished. No distress.  HENT:  Head: Normocephalic and atraumatic.  Right Ear: External ear normal.  Left Ear: External ear normal.  Mouth/Throat: Oropharynx is clear and moist. No oropharyngeal exudate.  Eyes: Conjunctivae are normal. Pupils are equal, round, and reactive to light. Right eye exhibits no discharge.  Neck: Normal range of motion. Neck supple. No tracheal deviation present.  Cardiovascular: Normal rate, regular rhythm and intact distal pulses.   Pulmonary/Chest: Effort normal. No respiratory distress. He has no wheezes. He has no rales.  Abdominal: Soft. He exhibits no distension. There is no tenderness. There is no rebound and no guarding.  Musculoskeletal: Normal range of motion.  Neurological: He is alert and oriented to person, place, and time.  Skin: Skin is warm and dry. No rash noted. He is not diaphoretic.  Psychiatric: He has a normal mood and affect.    ED Course  Procedures (including critical care time) Labs Review Labs Reviewed  CBC - Abnormal; Notable for the following:    HCT 38.8 (*)    Platelets 78 (*)    All other components within normal limits  BASIC METABOLIC PANEL - Abnormal; Notable for the following:    Glucose, Bld 238 (*)    GFR calc non Af Amer 69 (*)    GFR calc Af Amer 80 (*)    All other components within normal limits  PRO B NATRIURETIC PEPTIDE  TROPONIN I  TROPONIN I  TROPONIN I  HEMOGLOBIN A1C  TSH  LIPID PANEL  BASIC METABOLIC PANEL  PROTIME-INR  CBC  POCT I-STAT TROPONIN I   Imaging Review Dg Chest 2 View  05/01/2013   CLINICAL DATA:  Chest pain, shortness of breath  EXAM: CHEST  2 VIEW  COMPARISON:  May 31, 2012  FINDINGS: The heart size and mediastinal contours are stable. Patient status post prior CABG and  median sternotomy. Both lungs are clear. There is no focal infiltrate, pulmonary edema, or pleural effusion. The visualized skeletal structures are stable.  IMPRESSION: No active cardiopulmonary disease.   Electronically Signed   By: Sherian Rein M.D.   On: 05/01/2013 21:26    EKG Interpretation    Date/Time:  Saturday May 01 2013 19:56:07 EST Ventricular Rate:  63 PR Interval:  152 QRS Duration: 88 QT Interval:  432 QTC Calculation: 442 R Axis:   -2 Text Interpretation:  Normal sinus rhythm Low voltage QRS Borderline ECG Non-specific ST-t changes Confirmed by Bebe Shaggy  MD, DONALD [phone]) on 05/01/2013 8:58:43 PM  MDM   1. Chest pain     Christian Mccall is a 57 y.o. male with history of CAD x/p CABG, stented AAA, HTN, and DM who presents to the ED with substernal chest pain similar to previous MIs.  EKG without acute changes.  Initial troponin negative.  Small amount of morphine given with complete resolution of symptoms.  Given history and risk factors, patient will require admission.  Patient safe for admission to telemetry.  Cardiology consulted and will admit.  Patient admitted.    Arloa Koh, MD 05/03/13 [phone]

## 2013-05-01 NOTE — H&P (Signed)
PCP:   Gwen Pounds, MD   Primary cardiologist Dr. Verdis Prime with apical cardiology  Chief Complaint: Chest pain  HPI: [name] with known history of coronary artery disease including CABG x4 in 2002 and reportedly occluded all 4 of his bypass grafts, multiple PCI is however no records are available for my review. Who presented with worsening of initial exertional chest pain however on the day of presentation patient reported having fluctuating chest pain since 2 PM. His first episode occurred at rest was associate with shortness of breath at the time of the worst pain patient had 10 out of 10 chest pressure, episode responded to nitroglycerin x3 and pain finally almost went away in about 30 minutes. However the patient continued with minimal 1/10 chest discomfort, he had 2 more episodes at 5 PM and 7 PM. Finally his wife convinced him to go to the emergency department.  Otherwise patient reported that he feels mildly short of breath when he lays flat. He denied nausea vomiting, diaphoresis, diarrhea, constipation, urinary disturbances, excessive weight gain no lower extremity edema. Denied palpitations  Time of my evaluation patient had minimal chest discomfort therefore the decision was made to put her nitroglycerin drip and route to stepdown.  Review of Systems: 12 systems were reviewed and were negative except mentioned in the history of present illness  Past Medical History: Past Medical History  Diagnosis Date  . Hypertension   . Hyperlipidemia   . Myocardial infarction 2002  . COPD (chronic obstructive pulmonary disease)   . AAA (abdominal aortic aneurysm)   . Neuropathy   . CAD (coronary artery disease)   . Diabetes mellitus 2009  . Arthritis   . Joint pain   . Leg pain     with walking and pain in feet while lying flat  . Chest pain   . Shortness of breath 12/06/10    while lying flat and with exertion  . Reflux   . Stroke June 2014    Mini  pt.  had several small strokes   Past Surgical History  Procedure Laterality Date  . Coronary artery bypass graft  2002  . Abdominal aortic aneurysm repair  01-16-11    EVAR  . Abdominal aortic aneurysm repair      Medications: Prior to Admission medications   Medication Sig Start Date End Date Taking? Authorizing Provider  ADVAIR DISKUS 100-50 MCG/DOSE AEPB Inhale 1 puff into the lungs every 12 (twelve) hours.  10/16/12  Yes Historical Provider, MD  aspirin 325 MG tablet Take 325 mg by mouth daily.     Yes Historical Provider, MD  cholecalciferol (VITAMIN D) 1000 UNITS tablet Take 1,000 Units by mouth daily.   Yes Historical Provider, MD  clonazePAM (KLONOPIN) 1 MG tablet Take 1 mg by mouth at bedtime.   Yes Historical Provider, MD  clopidogrel (PLAVIX) 75 MG tablet Take 75 mg by mouth daily.     Yes Historical Provider, MD  cyanocobalamin (,VITAMIN B-12,) 1000 MCG/ML injection Inject 1,000 mcg into the muscle every 3 (three) months.   Yes Historical Provider, MD  escitalopram (LEXAPRO) 20 MG tablet Take 20 mg by mouth at bedtime.   Yes Historical Provider, MD  ezetimibe-simvastatin (VYTORIN) 10-40 MG per tablet Take 1 tablet by mouth at bedtime.     Yes Historical Provider, MD  fenofibrate 160 MG tablet Take 160 mg by mouth daily.  11/03/10  Yes Historical Provider, MD  furosemide (LASIX) 40 MG tablet Take 40 mg by  mouth every other day.    Yes Historical Provider, MD  glimepiride (AMARYL) 4 MG tablet Take 4 mg by mouth at bedtime.    Yes Historical Provider, MD  lansoprazole (PREVACID) 15 MG capsule Take 30 mg by mouth daily.    Yes Historical Provider, MD  losartan (COZAAR) 50 MG tablet Take 50 mg by mouth daily.     Yes Historical Provider, MD  metFORMIN (GLUCOPHAGE) 1000 MG tablet Take 500-1,000 mg by mouth 2 (two) times daily with a meal. Take one tab in am and 1/2 tab at night   Yes Historical Provider, MD  metoprolol (LOPRESSOR) 50 MG tablet Take 50 mg by mouth 2 (two) times daily.    Yes  Historical Provider, MD  nitroGLYCERIN (NITRODUR - DOSED IN MG/24 HR) 0.6 mg/hr Place 1 patch onto the skin daily.     Yes Historical Provider, MD  nitroGLYCERIN (NITROSTAT) 0.6 MG SL tablet Place 0.6 mg under the tongue every 5 (five) minutes as needed for chest pain.    Yes Historical Provider, MD  NON FORMULARY Take 1 capsule by mouth daily. Pharm [employer] Research Medication   Yes Historical Provider, MD  pramipexole (MIRAPEX) 0.25 MG tablet Take 0.25 mg by mouth at bedtime.  03/24/12  Yes Historical Provider, MD  pregabalin (LYRICA) 100 MG capsule Take 100-200 mg by mouth 2 (two) times daily. One Tab am and two Tabs pm   Yes Historical Provider, MD  ranolazine (RANEXA) 1000 MG SR tablet Take 1,000 mg by mouth 2 (two) times daily.     Yes Historical Provider, MD  terazosin (HYTRIN) 1 MG capsule Take 1 mg by mouth at bedtime.     Yes Historical Provider, MD  tiotropium (SPIRIVA) 18 MCG inhalation capsule Place 18 mcg into inhaler and inhale daily.    Yes Historical Provider, MD  VENTOLIN HFA 108 (90 BASE) MCG/ACT inhaler Inhale 1 puff into the lungs 2 (two) times daily as needed. For shortness of breath/difficulty breathing 12/05/10  Yes Historical Provider, MD    Allergies:   Allergies  Allergen Reactions  . Advair Diskus [Fluticasone-Salmeterol] Shortness Of Breath    Patient had a one time occurrence of shortness of breath and states that he doesn't think that he has a true allergy to med-has been taking med for years.     Social History:  reports that he quit smoking about 12 years ago. His smoking use included Cigarettes. He has a 60 pack-year smoking history. He has never used smokeless tobacco. He reports that he drinks alcohol. He reports that he does not use illicit drugs. The patient is normally at baseline able to perform his normal activities of daily leaving he takes nitroglycerin from once a week to once a month  Family History: Family History  Problem Relation Age of Onset   . Aneurysm Mother     AAA  . Heart disease Mother     Heart Disease before age 10  . Other Mother     Carotid artery stenosis  . Hyperlipidemia Mother   . Hypertension Mother   . Aneurysm Maternal Aunt     AAA  . Other Maternal Aunt     AAA  . Aneurysm Maternal Uncle     AAA  . Other Maternal Uncle     AAA  . Lupus Sister   . Heart disease Brother     Heart Disease before age 34  . Hypertension Brother   . Heart disease Brother   . Heart  attack Brother   . Heart attack Brother     PHYSICAL EXAM:  Filed Vitals:   05/01/13 2002 05/01/13 2111 05/01/13 2200  BP: 148/79 145/62 145/71  Pulse: 60 65 66  Temp: 98 F (36.7 C)    TempSrc: Oral    Resp: 16 9 18   Weight: 107.094 kg (236 lb 1.6 oz)    SpO2: 97% 97% 97%   General:  Well appearing. No respiratory difficulty. Patient was mildly-subjectively short of breath when I put him in supine position HEENT: normal Neck: supple. no JVD. Carotids 2+ bilat; no bruits. No lymphadenopathy or thryomegaly appreciated. Cor: PMI nondisplaced. Regular rate & rhythm. No rubs, gallops or murmurs. Post sternotomy scar in the middle of the chest Lungs: clear to auscultation and Abdomen: soft, nontender, nondistended. No hepatosplenomegaly. No bruits or masses. Good bowel sounds. Extremities: no cyanosis, clubbing, rash, 1+ pitting edema of right lower extremity, 1 were veins were harvested for CABG Neuro: alert & oriented x 3, cranial nerves grossly intact. moves all 4 extremities w/o difficulty. Affect pleasant.  Labs on Admission:   Recent Labs  05/01/13 2000  NA 137  K 4.5  CL 97  CO2 26  GLUCOSE 238*  BUN 14  CREATININE 1.16  CALCIUM 9.6   No results found for this basename: AST, ALT, ALKPHOS, BILITOT, PROT, ALBUMIN,  in the last 72 hours No results found for this basename: LIPASE, AMYLASE,  in the last 72 hours  Recent Labs  05/01/13 2000  WBC 5.5  HGB 13.2  HCT 38.8*  MCV 90.4  PLT 78*   No results found for  this basename: CKTOTAL, CKMB, CKMBINDEX, TROPONINI,  in the last 72 hours No results found for this basename: TSH, T4TOTAL, FREET3, T3FREE, THYROIDAB,  in the last 72 hours No results found for this basename: VITAMINB12, FOLATE, FERRITIN, TIBC, IRON, RETICCTPCT,  in the last 72 hours  Radiological Exams on Admission (all images were personally reviewed and interpreted by me, radiology reports were reviewed as well): Dg Chest 2 View  05/01/2013   CLINICAL DATA:  Chest pain, shortness of breath  EXAM: CHEST  2 VIEW  COMPARISON:  May 31, 2012  FINDINGS: The heart size and mediastinal contours are stable. Patient status post prior CABG and median sternotomy. Both lungs are clear. There is no focal infiltrate, pulmonary edema, or pleural effusion. The visualized skeletal structures are stable.  IMPRESSION: No active cardiopulmonary disease.   Electronically Signed   By: Sherian Rein M.D.   On: 05/01/2013 21:26   EKG personally reviewed and interpreted by me: Normal sinus rhythm, 63 beats per minutes, horizontal axis, compared to previous EKGs no significant changes noted  Assessment/Plan Acute coronary syndrome - for unstable angina his enzymes are negative  Patient history of CABG in 2002 four-vessel reportedly for bypass graft are occluded also, with history of multiple PCI since that time  Continue current home medications, will switch nitro paste nitroglycerin drip giving minimal residual chest pain -We'll cycle cardiac enzymes x3 -Heparin drip -Plavix load 600 mg times one and continued Plavix 75 mg daily -Full dose aspirin given in the ER we'll continue 81 mg daily  -Patient will need ischemic evaluation this admission, unfortunately I am not able to access his previous cardiac catheterization or stress test. If his enzymes his negative would favor nuclear micro-perfusion study first and then possibly left heart cath depends on the results Poss this will help to guide possible intervention.    Hyperlipidemia Continue home  Vytorin Patient is a and ACCELERATE study and is receiving evacetrapib/placebo  Diabetes mellitus type 2 SSI for now, will adjust insulin dose accordingly Would check hemoglobin A1c  Peripheral vascular disease, AAA status post EVAR - No concerns at that time however will be important for possible planned catheterization  Thrombocytopenia mild, asymptomatic Will monitor platelet count patient is supposed to see Dr. Marlena Clipper No overt bleeding on dual antiplatelet therapy    Christian Mccall 05/01/2013, 10:34 PM

## 2013-05-02 DIAGNOSIS — I798 Other disorders of arteries, arterioles and capillaries in diseases classified elsewhere: Secondary | ICD-10-CM

## 2013-05-02 DIAGNOSIS — E1159 Type 2 diabetes mellitus with other circulatory complications: Secondary | ICD-10-CM

## 2013-05-02 DIAGNOSIS — I200 Unstable angina: Secondary | ICD-10-CM

## 2013-05-02 DIAGNOSIS — R079 Chest pain, unspecified: Secondary | ICD-10-CM

## 2013-05-02 DIAGNOSIS — I714 Abdominal aortic aneurysm, without rupture: Secondary | ICD-10-CM

## 2013-05-02 LAB — GLUCOSE, CAPILLARY
Glucose-Capillary: 207 mg/dL — ABNORMAL HIGH (ref 70–99)
Glucose-Capillary: 279 mg/dL — ABNORMAL HIGH (ref 70–99)

## 2013-05-02 LAB — CBC
MCH: 30.6 pg (ref 26.0–34.0)
MCH: 30.9 pg (ref 26.0–34.0)
MCHC: 34.6 g/dL (ref 30.0–36.0)
MCHC: 34.5 g/dL (ref 30.0–36.0)
MCV: 88.5 fL (ref 78.0–100.0)
Platelets: 72 10*3/uL — ABNORMAL LOW (ref 150–400)
Platelets: 69 10*3/uL — ABNORMAL LOW (ref 150–400)
RDW: 13.2 % (ref 11.5–15.5)
RDW: 13.3 % (ref 11.5–15.5)

## 2013-05-02 LAB — LIPID PANEL
Cholesterol: 129 mg/dL (ref 0–200)
HDL: 69 mg/dL (ref >39)
LDL Cholesterol: 14 mg/dL (ref 0–99)
Total CHOL/HDL Ratio: 1.9 RATIO
Triglycerides: 230 mg/dL — ABNORMAL HIGH (ref <150)
VLDL: 46 mg/dL — ABNORMAL HIGH (ref 0–40)

## 2013-05-02 LAB — HEMOGLOBIN A1C: Hgb A1c MFr Bld: 7.9 % — ABNORMAL HIGH (ref <5.7)

## 2013-05-02 LAB — HEPARIN LEVEL (UNFRACTIONATED): Heparin Unfractionated: 0.25 IU/mL — ABNORMAL LOW (ref 0.30–0.70)

## 2013-05-02 LAB — BASIC METABOLIC PANEL
BUN: 15 mg/dL (ref 6–23)
CO2: 26 mEq/L (ref 19–32)
Calcium: 9 mg/dL (ref 8.4–10.5)
Chloride: 100 mEq/L (ref 96–112)
Creatinine, Ser: 0.99 mg/dL (ref 0.50–1.35)
GFR calc Af Amer: >90 mL/min (ref >90)
GFR calc non Af Amer: 90 mL/min — ABNORMAL LOW (ref >90)

## 2013-05-02 LAB — TROPONIN I
Troponin I: <0.3 ng/mL (ref <0.30)
Troponin I: <0.3 ng/mL (ref <0.30)

## 2013-05-02 LAB — PROTIME-INR: Prothrombin Time: 13.8 seconds (ref 11.6–15.2)

## 2013-05-02 MED ORDER — TIOTROPIUM BROMIDE MONOHYDRATE 18 MCG IN CAPS
18.0000 ug | ORAL_CAPSULE | Freq: Every day | RESPIRATORY_TRACT | Status: DC
  Administered 2013-05-02 – 2013-05-03 (×2): 18 ug via RESPIRATORY_TRACT
  Filled 2013-05-02: qty 5

## 2013-05-02 MED ORDER — ALBUTEROL SULFATE HFA 108 (90 BASE) MCG/ACT IN AERS
1.0000 | INHALATION_SPRAY | Freq: Two times a day (BID) | RESPIRATORY_TRACT | Status: DC
  Administered 2013-05-03: 1 via RESPIRATORY_TRACT

## 2013-05-02 MED ORDER — FUROSEMIDE 40 MG PO TABS
40.0000 mg | ORAL_TABLET | ORAL | Status: DC
  Administered 2013-05-02: 40 mg via ORAL
  Filled 2013-05-02: qty 1

## 2013-05-02 MED ORDER — VITAMIN D3 25 MCG (1000 UNIT) PO TABS
1000.0000 [IU] | ORAL_TABLET | Freq: Every day | ORAL | Status: DC
  Administered 2013-05-02 – 2013-05-03 (×2): 1000 [IU] via ORAL
  Filled 2013-05-02 (×2): qty 1

## 2013-05-02 MED ORDER — STUDY - INVESTIGATIONAL DRUG SIMPLE RECORD
1.0000 mg | Freq: Every day | Status: DC
  Administered 2013-05-02 – 2013-05-03 (×2): 1 mg via ORAL

## 2013-05-02 MED ORDER — HEPARIN BOLUS VIA INFUSION
5000.0000 [IU] | Freq: Once | INTRAVENOUS | Status: AC
  Administered 2013-05-02: 5000 [IU] via INTRAVENOUS
  Filled 2013-05-02: qty 5000

## 2013-05-02 MED ORDER — MOMETASONE FURO-FORMOTEROL FUM 100-5 MCG/ACT IN AERO
2.0000 | INHALATION_SPRAY | Freq: Two times a day (BID) | RESPIRATORY_TRACT | Status: DC
  Administered 2013-05-02 – 2013-05-03 (×3): 2 via RESPIRATORY_TRACT
  Filled 2013-05-02: qty 8.8

## 2013-05-02 MED ORDER — NON FORMULARY: 1.0000 | Freq: Every day | Status: DC

## 2013-05-02 MED ORDER — CYANOCOBALAMIN 1000 MCG/ML IJ SOLN: 1000.0000 ug | INTRAMUSCULAR | Status: DC

## 2013-05-02 MED ORDER — CLONAZEPAM 0.5 MG PO TABS
1.0000 mg | ORAL_TABLET | Freq: Every day | ORAL | Status: DC
  Administered 2013-05-02: 1 mg via ORAL
  Filled 2013-05-02: qty 2

## 2013-05-02 MED ORDER — PRAMIPEXOLE DIHYDROCHLORIDE 0.25 MG PO TABS
0.2500 mg | ORAL_TABLET | Freq: Every day | ORAL | Status: DC
  Administered 2013-05-02: 0.25 mg via ORAL
  Filled 2013-05-02 (×2): qty 1

## 2013-05-02 MED ORDER — PREGABALIN 100 MG PO CAPS
100.0000 mg | ORAL_CAPSULE | Freq: Every day | ORAL | Status: DC
  Administered 2013-05-02 – 2013-05-03 (×2): 100 mg via ORAL
  Filled 2013-05-02 (×2): qty 1

## 2013-05-02 MED ORDER — INSULIN ASPART 100 UNIT/ML ~~LOC~~ SOLN
0.0000 [IU] | Freq: Every day | SUBCUTANEOUS | Status: DC
  Administered 2013-05-02: 4 [IU] via SUBCUTANEOUS

## 2013-05-02 MED ORDER — INSULIN ASPART 100 UNIT/ML ~~LOC~~ SOLN
0.0000 [IU] | Freq: Three times a day (TID) | SUBCUTANEOUS | Status: DC
  Administered 2013-05-02: 8 [IU] via SUBCUTANEOUS
  Administered 2013-05-02: 5 [IU] via SUBCUTANEOUS
  Administered 2013-05-02 – 2013-05-03 (×2): 3 [IU] via SUBCUTANEOUS

## 2013-05-02 MED ORDER — LOSARTAN POTASSIUM 50 MG PO TABS
50.0000 mg | ORAL_TABLET | Freq: Every day | ORAL | Status: DC
  Administered 2013-05-02 – 2013-05-03 (×2): 50 mg via ORAL
  Filled 2013-05-02 (×2): qty 1

## 2013-05-02 MED ORDER — ALBUTEROL SULFATE HFA 108 (90 BASE) MCG/ACT IN AERS
1.0000 | INHALATION_SPRAY | Freq: Four times a day (QID) | RESPIRATORY_TRACT | Status: DC
  Administered 2013-05-02 (×3): 1 via RESPIRATORY_TRACT
  Filled 2013-05-02: qty 6.7

## 2013-05-02 MED ORDER — FENOFIBRATE 160 MG PO TABS
160.0000 mg | ORAL_TABLET | Freq: Every day | ORAL | Status: DC
  Administered 2013-05-02 – 2013-05-03 (×2): 160 mg via ORAL
  Filled 2013-05-02 (×2): qty 1

## 2013-05-02 MED ORDER — ISOSORBIDE MONONITRATE ER 30 MG PO TB24
30.0000 mg | ORAL_TABLET | Freq: Every day | ORAL | Status: DC
  Administered 2013-05-02 – 2013-05-03 (×2): 30 mg via ORAL
  Filled 2013-05-02 (×2): qty 1

## 2013-05-02 MED ORDER — METOPROLOL TARTRATE 50 MG PO TABS
50.0000 mg | ORAL_TABLET | Freq: Two times a day (BID) | ORAL | Status: DC
  Administered 2013-05-02 – 2013-05-03 (×4): 50 mg via ORAL
  Filled 2013-05-02 (×5): qty 1

## 2013-05-02 MED ORDER — ALBUTEROL SULFATE HFA 108 (90 BASE) MCG/ACT IN AERS: 1.0000 | INHALATION_SPRAY | RESPIRATORY_TRACT | Status: DC | PRN

## 2013-05-02 MED ORDER — PREGABALIN 75 MG PO CAPS
200.0000 mg | ORAL_CAPSULE | Freq: Every day | ORAL | Status: DC
  Administered 2013-05-02: 22:00:00 200 mg via ORAL
  Filled 2013-05-02 (×2): qty 2

## 2013-05-02 MED ORDER — PANTOPRAZOLE SODIUM 40 MG PO TBEC
40.0000 mg | DELAYED_RELEASE_TABLET | Freq: Every day | ORAL | Status: DC
  Administered 2013-05-02 – 2013-05-03 (×2): 40 mg via ORAL
  Filled 2013-05-02 (×2): qty 1

## 2013-05-02 MED ORDER — ESCITALOPRAM OXALATE 20 MG PO TABS
20.0000 mg | ORAL_TABLET | Freq: Every day | ORAL | Status: DC
  Administered 2013-05-02 (×2): 20 mg via ORAL
  Filled 2013-05-02 (×3): qty 1

## 2013-05-02 MED ORDER — CLOPIDOGREL BISULFATE 75 MG PO TABS
75.0000 mg | ORAL_TABLET | Freq: Every day | ORAL | Status: DC
  Administered 2013-05-02: 75 mg via ORAL
  Filled 2013-05-02 (×3): qty 1

## 2013-05-02 MED ORDER — TERAZOSIN HCL 1 MG PO CAPS
1.0000 mg | ORAL_CAPSULE | Freq: Every day | ORAL | Status: DC
  Administered 2013-05-02: 1 mg via ORAL
  Filled 2013-05-02 (×2): qty 1

## 2013-05-02 MED ORDER — METOPROLOL TARTRATE 25 MG PO TABS
50.0000 mg | ORAL_TABLET | Freq: Once | ORAL | Status: AC
  Administered 2013-05-02: 50 mg via ORAL
  Filled 2013-05-02: qty 2

## 2013-05-02 MED ORDER — ENOXAPARIN SODIUM 40 MG/0.4ML ~~LOC~~ SOLN
40.0000 mg | Freq: Every day | SUBCUTANEOUS | Status: DC
  Administered 2013-05-02 – 2013-05-03 (×2): 40 mg via SUBCUTANEOUS
  Filled 2013-05-02 (×2): qty 0.4

## 2013-05-02 MED ORDER — EZETIMIBE-SIMVASTATIN 10-40 MG PO TABS
1.0000 | ORAL_TABLET | Freq: Every day | ORAL | Status: DC
  Administered 2013-05-02: 1 via ORAL
  Filled 2013-05-02 (×2): qty 1

## 2013-05-02 NOTE — Progress Notes (Addendum)
    Subjective:  Feeling better. Was scared yesterday. Pain occurred at rest. Quite severe for him. He states that his last stress test was over a year and a half ago. He is had multiple PCI/prior bypass surgery with failed grafts.  Her other died from heart attack. His mother was taken care of by Dr. Katrinka Blazing as well.  Objective:  Vital Signs in the last 24 hours: Temp:  [97.2 F (36.2 C)-98 F (36.7 C)] 97.9 F (36.6 C) (11/23 0838) Pulse Rate:  [56-66] 64 (11/23 0838) Resp:  [9-22] 22 (11/23 0838) BP: (133-148)/(51-79) 137/60 mmHg (11/23 0838) SpO2:  [94 %-97 %] 95 % (11/23 0838) Weight:  [231 lb 11.3 oz (105.1 kg)-236 lb 1.6 oz (107.094 kg)] 231 lb 11.3 oz (105.1 kg) (11/23 0500)  Intake/Output from previous day: 11/22 0701 - 11/23 0700 In: 29 [I.V.:29] Out: -    Physical Exam: General: Well developed, well nourished, in no acute distress. Head:  Normocephalic and atraumatic. Lungs: Clear to auscultation and percussion. Heart: Normal S1 and S2.  No murmur, rubs or gallops.  Abdomen: soft, non-tender, positive bowel sounds. Overweight Extremities: No clubbing or cyanosis. No edema. Vein harvest scar noted Neurologic: Alert and oriented x 3.    Lab Results:  Recent Labs  05/02/13 0410 05/02/13 0730  WBC 6.5 5.6  HGB 13.3 14.0  PLT 72* 69*    Recent Labs  05/01/13 2000 05/02/13 0410  NA 137 136  K 4.5 3.5  CL 97 100  CO2 26 26  GLUCOSE 238* 178*  BUN 14 15  CREATININE 1.16 0.99    Recent Labs  05/01/13 2309 05/02/13 0410  TROPONINI <0.30 <0.30   Imaging: Dg Chest 2 View  05/01/2013   CLINICAL DATA:  Chest pain, shortness of breath  EXAM: CHEST  2 VIEW  COMPARISON:  May 31, 2012  FINDINGS: The heart size and mediastinal contours are stable. Patient status post prior CABG and median sternotomy. Both lungs are clear. There is no focal infiltrate, pulmonary edema, or pleural effusion. The visualized skeletal structures are stable.  IMPRESSION: No  active cardiopulmonary disease.   Electronically Signed   By: Sherian Rein M.D.   On: 05/01/2013 21:26   Personally viewed.   Telemetry: Sinus rhythm/sinus bradycardia and no adverse arrhythmias Personally viewed.   EKG:  No ST segment changes noted   Assessment/Plan:  Active Problems:   Unstable angina  1. Unstable angina-currently reassuring troponin. Quite extensive coronary history. Since troponin is normal, no ST segment changes, I will discontinue his IV heparin. Chronic thrombocytopenia noted with platelet count of 69,000. In December of 2013 platelet count was 91. There is no active signs of bleeding.   I will set him up for a nuclear stress test tomorrow.  n.p.o. past midnight. Further evaluation pending stress test. Continue with anti-anginal therapy. Wean nitroglycerin as tolerated. BNP 32.  Plavix has been loaded. Continue aspirin.  I will have isosorbide 30 mg to his anti-anginal regimen. I will discontinue low-dose nitroglycerin drip. I will start him on DVT prophylaxis dose Lovenox 40 mg.  2. Hyperlipidemia-Vytorin, study drug  3. Diabetes-A1c pending. Random glucose 178.  4. Mild thrombocytopenia-he is supposed to see Dr.Grandfortuna soon. No bleeding.   5. Peripheral vascular disease with abdominal aortic aneurysm status post endovascular repair. - Keep this in mind for possible future cardiac catheterization.  Rupa Lagan 05/02/2013, 11:40 AM

## 2013-05-02 NOTE — Progress Notes (Signed)
ANTICOAGULATION CONSULT NOTE - Follow Up  Pharmacy Consult for heparin Indication: chest pain/ACS  Allergies  Allergen Reactions  . Advair Diskus [Fluticasone-Salmeterol] Shortness Of Breath    Patient had a one time occurrence of shortness of breath and states that he doesn't think that he has a true allergy to med-has been taking med for years.    Patient Measurements: Height: 6' (182.9 cm) Weight: 231 lb 11.3 oz (105.1 kg) IBW/kg (Calculated) : 77.6 Heparin Dosing Weight: 100kg  Vital Signs: Temp: 97.9 F (36.6 C) (11/23 0838) Temp src: Oral (11/23 0838) BP: 137/60 mmHg (11/23 0838) Pulse Rate: 64 (11/23 0838)  Labs:  Recent Labs  05/01/13 2000 05/01/13 2309 05/02/13 0410 05/02/13 0730  HGB 13.2  --  13.3 14.0  HCT 38.8*  --  38.4* 40.6  PLT 78*  --  72* 69*  LABPROT  --   --  13.8  --   INR  --   --  1.08  --   HEPARINUNFRC  --   --   --  0.25*  CREATININE 1.16  --  0.99  --   TROPONINI  --  <0.30 <0.30  --    Estimated Creatinine Clearance: 104.4 ml/min (by C-G formula based on Cr of 0.99).  Medical History: Past Medical History  Diagnosis Date  . Hypertension   . Hyperlipidemia   . Myocardial infarction 2002  . COPD (chronic obstructive pulmonary disease)   . AAA (abdominal aortic aneurysm)   . Neuropathy   . CAD (coronary artery disease)   . Diabetes mellitus 2009  . Arthritis   . Joint pain   . Leg pain     with walking and pain in feet while lying flat  . Chest pain   . Shortness of breath 12/06/10    while lying flat and with exertion  . Reflux   . Stroke June 2014    Mini  pt. had several small strokes   Assessment: 57yo male w/ CAD c/o non-radiating CP/pressure despite nitro patch, relieved w/ SL NTG x3, to begin heparin.  He has a known history of past CABG and multiple PCI's.  He was started on IV heparin at 1300 units/hr.  His initial heparin level is 0.25IU/ml on this rate without noted bleeding complications or IV issues per his nurse.   He has some thrombocytopenia and we are following this closely with the physician.  NOTE:  He is participating in the ACCELERATE study - I tried to evaluate for exclusion medications, however, the only thing I could find was Niacin > 250mg /day.  Please refer to protocol to ensure no exclusionary medications.  Goal of Therapy:  Heparin level 0.3-0.7 units/ml Monitor platelets by anticoagulation protocol: Yes   Plan:  Increase IV Heparin to 1400 units/hr F/U AM heparin level and adjust Monitor CBC, thrombocytopenia and s/s of bleeding complications  Nadara Mustard, PharmD., MS Clinical Pharmacist Pager:  [phone] Thank you for allowing pharmacy to be part of this patients care team. 05/02/2013,11:28 AM

## 2013-05-02 NOTE — ED Notes (Signed)
Called [phone] for report X 2 times .As per RN room needs to be zapped.Pt wife and pt is upset regarding the wait.

## 2013-05-03 ENCOUNTER — Inpatient Hospital Stay (HOSPITAL_COMMUNITY): Payer: 59

## 2013-05-03 DIAGNOSIS — I10 Essential (primary) hypertension: Secondary | ICD-10-CM

## 2013-05-03 DIAGNOSIS — E785 Hyperlipidemia, unspecified: Secondary | ICD-10-CM | POA: Diagnosis present

## 2013-05-03 HISTORY — DX: Essential (primary) hypertension: I10

## 2013-05-03 LAB — GLUCOSE, CAPILLARY
Glucose-Capillary: 251 mg/dL — ABNORMAL HIGH (ref 70–99)
Glucose-Capillary: 252 mg/dL — ABNORMAL HIGH (ref 70–99)
Glucose-Capillary: 287 mg/dL — ABNORMAL HIGH (ref 70–99)

## 2013-05-03 LAB — CBC
HCT: 37.8 % — ABNORMAL LOW (ref 39.0–52.0)
Hemoglobin: 13 g/dL (ref 13.0–17.0)
MCV: 88.9 fL (ref 78.0–100.0)
Platelets: 70 10*3/uL — ABNORMAL LOW (ref 150–400)
RBC: 4.25 MIL/uL (ref 4.22–5.81)
RDW: 13.2 % (ref 11.5–15.5)
WBC: 5.1 10*3/uL (ref 4.0–10.5)

## 2013-05-03 LAB — HEPARIN LEVEL (UNFRACTIONATED): Heparin Unfractionated: <0.1 IU/mL — ABNORMAL LOW (ref 0.30–0.70)

## 2013-05-03 MED ORDER — REGADENOSON 0.4 MG/5ML IV SOLN
INTRAVENOUS | Status: AC
  Administered 2013-05-03: 0.4 mg via INTRAVENOUS
  Filled 2013-05-03: qty 5

## 2013-05-03 MED ORDER — TECHNETIUM TC 99M SESTAMIBI GENERIC - CARDIOLITE
30.0000 | Freq: Once | INTRAVENOUS | Status: AC | PRN
  Administered 2013-05-03: 30 via INTRAVENOUS

## 2013-05-03 MED ORDER — REGADENOSON 0.4 MG/5ML IV SOLN
0.4000 mg | Freq: Once | INTRAVENOUS | Status: AC
  Administered 2013-05-03: 0.4 mg via INTRAVENOUS

## 2013-05-03 MED ORDER — TECHNETIUM TC 99M SESTAMIBI GENERIC - CARDIOLITE
10.0000 | Freq: Once | INTRAVENOUS | Status: AC | PRN
  Administered 2013-05-03: 10 via INTRAVENOUS

## 2013-05-03 MED ORDER — INSULIN ASPART 100 UNIT/ML ~~LOC~~ SOLN: 0.0000 [IU] | Freq: Three times a day (TID) | SUBCUTANEOUS | Status: DC

## 2013-05-03 NOTE — Discharge Summary (Signed)
Physician Discharge Summary  Patient ID: Christian Mccall MRN: [id number] DOB/AGE: 06/18/1955 57 y.o.  Admit date: 05/01/2013 Discharge date: 05/03/2013  Admission Diagnoses: Chest pain, myocardial infarction excluded with negative markers, stable EKGs, and low risk nuclear study  Discharge Diagnoses:  Hyperlipidemia Hypertension Stable abdominal aortic aneurysm Diabetes mellitus  Principal Problem:   Chest pain Active Problems:   Abdominal aortic aneurysm   Shortness of breath   Other and unspecified hyperlipidemia   Essential hypertension, benign   Discharged Condition: Improved  Hospital Course: 57 year old gentleman with complicated prior history of coronary disease. He was admitted after developing a severe episode of left chest discomfort, relatively localized, but felt as though a knife were being thrust into his chest. It lasted approximately 15 minutes and resolved. This happened on 2 occasions over 48 hours. His wife noticed a second episode in the manner that he come to the hospital. The discomfort resolved spontaneously. It did not recur while hospitalized. Markers and EKGs were unremarkable. A nuclear perfusion study did not demonstrate ischemia.  The patient was able to ambulate without difficulty. He was felt stable for discharge on 05/03/13. No change in medical therapy was made.  Significant Diagnostic Studies: nuclear medicine: Myocardial perfusion study  Discharge Exam: Blood pressure 124/99, pulse 68, temperature 97.6 F (36.4 C), temperature source Oral, resp. rate 14, height 6' (1.829 m), weight 229 lb 11.5 oz (104.2 kg), SpO2 97.00%.  Chest is clear Cardiac exam is unremarkable No edema with symmetric pulses   Disposition: 01-Home or Self Care   Future Appointments Provider Department Dept Phone   05/05/2013 1:30 PM [facility] [phone]   05/05/2013 1:45 PM Krista Blue [facility] [phone]   05/05/2013 2:00 PM Benjiman Core, MD [facility] MEDICAL ONCOLOGY [phone]   05/17/2013 2:00 PM Ronal Fear, NP [facility] [phone]   02/10/2014 9:00 AM [facility] [place] [facility] [address] [phone]   Eat a light meal the night before the exam but please avoid gaseous foods.   Nothing to eat or drink for at least 8 hours prior to the exam. No gum chewing or smoking the morning of the exam. Please take your morning medications with small sips of water, especially blood pressure medication. If you have several vascular lab exams and will see physician, please bring a snack with you.   02/10/2014 9:30 AM Sherren Kerns, MD [facility] [phone]       Medication List         ADVAIR DISKUS 100-50 MCG/DOSE Aepb  Generic drug:  Fluticasone-Salmeterol  Inhale 1 puff into the lungs every 12 (twelve) hours.     aspirin 325 MG tablet  Take 325 mg by mouth daily.     cholecalciferol 1000 UNITS tablet  Commonly known as:  VITAMIN D  Take 1,000 Units by mouth daily.     clonazePAM 1 MG tablet  Commonly known as:  KLONOPIN  Take 1 mg by mouth at bedtime.     clopidogrel 75 MG tablet  Commonly known as:  PLAVIX  Take 75 mg by mouth daily.     cyanocobalamin 1000 MCG/ML injection  Commonly known as:  (VITAMIN B-12)  Inject 1,000 mcg into the muscle every 3 (three) months.     escitalopram 20 MG tablet  Commonly known as:  LEXAPRO  Take 20 mg by mouth at bedtime.  ezetimibe-simvastatin 10-40 MG per tablet  Commonly known as:  VYTORIN  Take 1 tablet by mouth at bedtime.     fenofibrate 160 MG tablet  Take 160 mg by mouth daily.     furosemide 40 MG tablet  Commonly known as:  LASIX  Take 40 mg by mouth every other day.     glimepiride 4 MG tablet  Commonly known as:  AMARYL  Take 4 mg by mouth at bedtime.     lansoprazole 15 MG capsule   Commonly known as:  PREVACID  Take 30 mg by mouth daily.     losartan 50 MG tablet  Commonly known as:  COZAAR  Take 50 mg by mouth daily.     metFORMIN 1000 MG tablet  Commonly known as:  GLUCOPHAGE  Take 500-1,000 mg by mouth 2 (two) times daily with a meal. Take one tab in am and 1/2 tab at night     metoprolol 50 MG tablet  Commonly known as:  LOPRESSOR  Take 50 mg by mouth 2 (two) times daily.     nitroGLYCERIN 0.6 mg/hr patch  Commonly known as:  NITRODUR - Dosed in mg/24 hr  Place 1 patch onto the skin daily.     nitroGLYCERIN 0.6 MG SL tablet  Commonly known as:  NITROSTAT  Place 0.6 mg under the tongue every 5 (five) minutes as needed for chest pain.     NON FORMULARY  Take 1 capsule by mouth daily. Pharm [employer] Research Medication     pramipexole 0.25 MG tablet  Commonly known as:  MIRAPEX  Take 0.25 mg by mouth at bedtime.     pregabalin 100 MG capsule  Commonly known as:  LYRICA  Take 100-200 mg by mouth 2 (two) times daily. One Tab am and two Tabs pm     RANEXA 1000 MG SR tablet  Generic drug:  ranolazine  Take 1,000 mg by mouth 2 (two) times daily.     terazosin 1 MG capsule  Commonly known as:  HYTRIN  Take 1 mg by mouth at bedtime.     tiotropium 18 MCG inhalation capsule  Commonly known as:  SPIRIVA  Place 18 mcg into inhaler and inhale daily.     VENTOLIN HFA 108 (90 BASE) MCG/ACT inhaler  Generic drug:  albuterol  Inhale 1 puff into the lungs 2 (two) times daily as needed. For shortness of breath/difficulty breathing           Follow-up Information   Follow up with Lesleigh Noe, MD. (As previously scheduled.)    Specialty:  Cardiology   Contact information:   [address] [place] [address] [phone]       Signed: KEIYON, PLACK 05/03/2013, 1:29 PM

## 2013-05-03 NOTE — Progress Notes (Signed)
Utilization Review Completed.  

## 2013-05-03 NOTE — Progress Notes (Signed)
Inpatient Diabetes Program Recommendations  AACE/ADA: New Consensus Statement on Inpatient Glycemic Control (2013)  Target Ranges:  Prepandial:   less than 140 mg/dL      Peak postprandial:   less than 180 mg/dL (1-2 hours)      Critically ill patients:  140 - 180 mg/dL  Results for RAIQUAN, CHANDLER (MRN [id number]) as of 05/03/2013 10:15  Ref. Range 05/02/2013 08:36 05/02/2013 13:05 05/02/2013 17:02 05/02/2013 21:35 05/03/2013 08:18  Glucose-Capillary Latest Range: 70-99 mg/dL 409 (H) 811 (H) 914 (H) 315 (H) 251 (H)    Inpatient Diabetes Program Recommendations Insulin - Basal: Please consider ordering low dose basal insulin; recommend starting with Levemir 10 units Q24H. Correction (SSI): Please consider increasing Novolog correction to resistant scale ACHS.  Note: Patient has a history of diabetes and takes Amaryl 4 mg QHS, Metformin 1000 mg QAM, and Metformin 500 mg QPM as an outpatient for diabetes management.  Currently, patient is ordered to receive Novolog 0-15 units AC and Novolog 0-5 units HS for inpatient glycemic control.  Blood glucose ranged from 204-315 mg/dl on 78/29 and fasting glucose this morning was 251 mg/dl.  Please consider ordering low dose basal insulin; recommend starting with Levemir 10 units Q24H and increasing Novolog correction to resistant scale.  Will continue to follow.  Thanks, Orlando Penner, RN, MSN, CCRN Diabetes Coordinator Inpatient Diabetes Program [phone] (Team Pager) [phone] ([facility] office) [phone] [facility] office)

## 2013-05-03 NOTE — Progress Notes (Signed)
       Patient Name: Christian Mccall Date of Encounter: 05/03/2013    SUBJECTIVE: All data reviewed. Has not had recurrence. Markers and ECG are silent.  TELEMETRY:  NSR Filed Vitals:   05/02/13 1657 05/02/13 2110 05/02/13 2300 05/03/13 0409  BP: 126/77 141/69 130/64 100/51  Pulse: 63 65 67 73  Temp: 98.7 F (37.1 C)  98.6 F (37 C) 97.9 F (36.6 C)  TempSrc: Oral  Axillary Oral  Resp: 18 22 19 14   Height:      Weight:    229 lb 11.5 oz (104.2 kg)  SpO2: 95% 93% 92% 91%    Intake/Output Summary (Last 24 hours) at 05/03/13 0802 Last data filed at 05/02/13 1300  Gross per 24 hour  Intake    240 ml  Output      0 ml  Net    240 ml    LABS: Basic Metabolic Panel:  Recent Labs  78/29/56 2000 05/02/13 0410  NA 137 136  K 4.5 3.5  CL 97 100  CO2 26 26  GLUCOSE 238* 178*  BUN 14 15  CREATININE 1.16 0.99  CALCIUM 9.6 9.0   CBC:  Recent Labs  05/02/13 0730 05/03/13 0500  WBC 5.6 5.1  HGB 14.0 13.0  HCT 40.6 37.8*  MCV 89.6 88.9  PLT 69* 70*   Cardiac Enzymes:  Recent Labs  05/01/13 2309 05/02/13 0410 05/02/13 1200  TROPONINI <0.30 <0.30 <0.30   BNP: No components found with this basename: POCBNP,  Hemoglobin A1C:  Recent Labs  05/01/13 2309  HGBA1C 7.9*   Fasting Lipid Panel:  Recent Labs  05/02/13 0410  CHOL 129  HDL 69  LDLCALC 14  TRIG 230*  CHOLHDL 1.9    Radiology/Studies:  NAD  Physical Exam: Blood pressure 100/51, pulse 73, temperature 97.9 F (36.6 C), temperature source Oral, resp. rate 14, height 6' (1.829 m), weight 229 lb 11.5 oz (104.2 kg), SpO2 91.00%. Weight change: -6 lb 6.1 oz (-2.894 kg)   No murmur or gallop.  ASSESSMENT:  1. Chest pain with no objective evidence ischemia or MI. 2. Thrombocytopenia 3. DM with complications  Plan:  1. Nuclear study. If no ischemia or high risk features, will discharge and d/c plavix.  AUNDREY, ELAHI W 05/03/2013, 8:02 AM

## 2013-05-04 NOTE — ED Provider Notes (Signed)
I have personally seen and examined the patient.  I have discussed the plan of care with the resident.  I have reviewed the documentation on PMH/FH/Soc. History.  I have reviewed the documentation of the resident and agree.  I have reviewed and agree with the ECG interpretation(s) documented by the resident.  Pt stable in the ED.  He was nearly CP free on my evaluation Admitted to cardiology for further evaluation   Joya Gaskins, MD 05/04/13 [phone]

## 2013-05-05 ENCOUNTER — Ambulatory Visit (HOSPITAL_BASED_OUTPATIENT_CLINIC_OR_DEPARTMENT_OTHER): Payer: Commercial Managed Care - HMO | Admitting: Oncology

## 2013-05-05 ENCOUNTER — Encounter: Payer: Self-pay | Admitting: Oncology

## 2013-05-05 ENCOUNTER — Telehealth: Payer: Self-pay | Admitting: Oncology

## 2013-05-05 ENCOUNTER — Ambulatory Visit: Payer: 59

## 2013-05-05 ENCOUNTER — Other Ambulatory Visit (HOSPITAL_BASED_OUTPATIENT_CLINIC_OR_DEPARTMENT_OTHER): Payer: Commercial Managed Care - HMO | Admitting: Lab

## 2013-05-05 VITALS — BP 135/71 | HR 56 | Temp 97.5°F | Resp 18 | Ht 72.0 in | Wt 233.8 lb

## 2013-05-05 DIAGNOSIS — D696 Thrombocytopenia, unspecified: Secondary | ICD-10-CM

## 2013-05-05 DIAGNOSIS — K7689 Other specified diseases of liver: Secondary | ICD-10-CM

## 2013-05-05 DIAGNOSIS — R141 Gas pain: Secondary | ICD-10-CM

## 2013-05-05 DIAGNOSIS — I209 Angina pectoris, unspecified: Secondary | ICD-10-CM

## 2013-05-05 LAB — CBC WITH DIFFERENTIAL/PLATELET
BASO%: 0.6 % (ref 0.0–2.0)
EOS%: 1.6 % (ref 0.0–7.0)
Eosinophils Absolute: 0.1 10*3/uL (ref 0.0–0.5)
MCH: 30.5 pg (ref 27.2–33.4)
MCHC: 34.4 g/dL (ref 32.0–36.0)
MCV: 88.7 fL (ref 79.3–98.0)
MONO#: 0.4 10*3/uL (ref 0.1–0.9)
MONO%: 8.7 % (ref 0.0–14.0)
NEUT#: 2.8 10*3/uL (ref 1.5–6.5)
Platelets: 73 10*3/uL — ABNORMAL LOW (ref 140–400)
RBC: 4.62 10*6/uL (ref 4.20–5.82)
RDW: 13.3 % (ref 11.0–14.6)
nRBC: 0 % (ref 0–0)

## 2013-05-05 LAB — COMPREHENSIVE METABOLIC PANEL (CC13)
ALT: 32 U/L (ref 0–55)
AST: 33 U/L (ref 5–34)
Alkaline Phosphatase: 69 U/L (ref 40–150)
Calcium: 10.2 mg/dL (ref 8.4–10.4)
Sodium: 137 mEq/L (ref 136–145)
Total Bilirubin: 0.68 mg/dL (ref 0.20–1.20)
Total Protein: 7.3 g/dL (ref 6.4–8.3)

## 2013-05-05 LAB — CHCC SMEAR

## 2013-05-05 NOTE — Progress Notes (Signed)
Checked in new patient with no financial issues. He has not been to [place] and has appt card. [facility] is primary and [employer] is 2ndary.

## 2013-05-05 NOTE — Consult Note (Signed)
Reason for Referral: Thrombocytopenia.   HPI: This is a 57 year old gentleman with a past medical history significant for coronary artery disease and diabetes. He was referred to me for the evaluation of chronic thrombocytopenia. His thrombocytopenia dates back to 2012 and have fluctuated as low as 90,000 and have spontaneously corrected papilla, 160,000. As of late however, it was noted that his blood counts have covered between 70 and 100,000. He has not reported any bleeding associated with that. He did not have any abnormalities in his red cells or white cell count. He was recently hospitalized for chest pain and evaluation for another myocardial infarction. During his hospitalizations his blood counts have drifted as low as 69,000 without any bleeding.   Clinically, Christian Mccall does not report any fevers or chills or sweats. He does not report weight loss or appetite changes. He does not report any shortness of breath or cough or hemoptysis. He does report intermittent angina and no leg edema. He has not reported any nausea or vomiting or abdominal pain. He did have abdominal distention and was evaluated by an ultrasound and he was told that he had fatty liver. He does not report any constipation or diarrhea. As that report any frequency urgency or hesitancy. The separately musculoskeletal complaints. At that point any heat or cold intolerance. He is continued to perform most activities of daily living without any hindrance or decline.   Past Medical History  Diagnosis Date  . Hypertension   . Hyperlipidemia   . Myocardial infarction 2002  . COPD (chronic obstructive pulmonary disease)   . AAA (abdominal aortic aneurysm)   . Neuropathy   . CAD (coronary artery disease)   . Diabetes mellitus 2009  . Arthritis   . Joint pain   . Leg pain     with walking and pain in feet while lying flat  . Chest pain   . Shortness of breath 12/06/10    while lying flat and with exertion  . Reflux   .  Stroke June 2014    Mini  pt. had several small strokes  :  Past Surgical History  Procedure Laterality Date  . Coronary artery bypass graft  2002  . Abdominal aortic aneurysm repair  01-16-11    EVAR  . Abdominal aortic aneurysm repair    :   Current Outpatient Prescriptions  Medication Sig Dispense Refill  . ADVAIR DISKUS 100-50 MCG/DOSE AEPB Inhale 1 puff into the lungs every 12 (twelve) hours.       [name] aspirin 325 MG tablet Take 325 mg by mouth daily.        . cholecalciferol (VITAMIN D) 1000 UNITS tablet Take 1,000 Units by mouth daily.      . clonazePAM (KLONOPIN) 1 MG tablet Take 1 mg by mouth at bedtime.      . clopidogrel (PLAVIX) 75 MG tablet Take 75 mg by mouth daily.        . cyanocobalamin (,VITAMIN B-12,) 1000 MCG/ML injection Inject 1,000 mcg into the muscle every 3 (three) months.      . escitalopram (LEXAPRO) 20 MG tablet Take 20 mg by mouth at bedtime.      [name] ezetimibe-simvastatin (VYTORIN) 10-40 MG per tablet Take 1 tablet by mouth at bedtime.        . fenofibrate 160 MG tablet Take 160 mg by mouth daily.       . furosemide (LASIX) 40 MG tablet Take 40 mg by mouth every other day.       [name]  glimepiride (AMARYL) 4 MG tablet Take 4 mg by mouth at bedtime.       . lansoprazole (PREVACID) 15 MG capsule Take 30 mg by mouth daily.       [name] losartan (COZAAR) 50 MG tablet Take 50 mg by mouth daily.        . metFORMIN (GLUCOPHAGE) 1000 MG tablet Take 500-1,000 mg by mouth 2 (two) times daily with a meal. Take one tab in am and 1/2 tab at night      . metoprolol (LOPRESSOR) 50 MG tablet Take 50 mg by mouth 2 (two) times daily.       . nitroGLYCERIN (NITRODUR - DOSED IN MG/24 HR) 0.6 mg/hr Place 1 patch onto the skin daily.        . nitroGLYCERIN (NITROSTAT) 0.6 MG SL tablet Place 0.6 mg under the tongue every 5 (five) minutes as needed for chest pain.       . NON FORMULARY Take 1 capsule by mouth daily. Pharm [employer] Research Medication      . pramipexole (MIRAPEX) 0.25 MG tablet  Take 0.25 mg by mouth at bedtime.       . pregabalin (LYRICA) 100 MG capsule Take 100-200 mg by mouth 2 (two) times daily. One Tab am and two Tabs pm      . ranolazine (RANEXA) 1000 MG SR tablet Take 1,000 mg by mouth 2 (two) times daily.        [name] terazosin (HYTRIN) 1 MG capsule Take 1 mg by mouth at bedtime.        [name] tiotropium (SPIRIVA) 18 MCG inhalation capsule Place 18 mcg into inhaler and inhale daily.       . VENTOLIN HFA 108 (90 BASE) MCG/ACT inhaler Inhale 1 puff into the lungs 2 (two) times daily as needed. For shortness of breath/difficulty breathing       No current facility-administered medications for this visit.      No Active Allergies:  Family History  Problem Relation Age of Onset  . Aneurysm Mother     AAA  . Heart disease Mother     Heart Disease before age 77  . Other Mother     Carotid artery stenosis  . Hyperlipidemia Mother   . Hypertension Mother   . Aneurysm Maternal Aunt     AAA  . Other Maternal Aunt     AAA  . Aneurysm Maternal Uncle     AAA  . Other Maternal Uncle     AAA  . Lupus Sister   . Heart disease Brother     Heart Disease before age 50  . Hypertension Brother   . Heart disease Brother   . Heart attack Brother   . Heart attack Brother   :  History   Social History  . Marital Status: Married    Spouse Name: N/A    Number of Children: N/A  . Years of Education: N/A   Occupational History  . Not on file.   Social History Main Topics  . Smoking status: Former Smoker -- 2.00 packs/day for 30 years    Types: Cigarettes    Quit date: 06/10/2000  . Smokeless tobacco: Never Used  . Alcohol Use: Yes     Comment: social  . Drug Use: No  . Sexual Activity: Not on file   Other Topics Concern  . Not on file   Social History Narrative  . No narrative on file  :  Pertinent items are noted in HPI.  Exam: Blood pressure 135/71, pulse 56, temperature 97.5 F (36.4 C), temperature source Oral, resp. rate 18, height 6' (1.829  m), weight 233 lb 12.8 oz (106.051 kg). General appearance: alert, cooperative and appears stated age Head: Normocephalic, without obvious abnormality, atraumatic Nose: Nares normal. Septum midline. Mucosa normal. No drainage or sinus tenderness. Throat: lips, mucosa, and tongue normal; teeth and gums normal Neck: no adenopathy, no carotid bruit, no JVD, supple, symmetrical, trachea midline and thyroid not enlarged, symmetric, no tenderness/mass/nodules Back: negative, symmetric, no curvature. ROM normal. No CVA tenderness. Resp: clear to auscultation bilaterally Chest wall: no tenderness Cardio: regular rate and rhythm, S1, S2 normal, no murmur, click, rub or gallop GI: soft, non-tender; bowel sounds normal; no masses,  no organomegaly Extremities: extremities normal, atraumatic, no cyanosis or edema Pulses: 2+ and symmetric Skin: Skin color, texture, turgor normal. No rashes or lesions Lymph nodes: Cervical, supraclavicular, and axillary nodes normal.   Recent Labs  05/03/13 0500 05/05/13 1346  WBC 5.1 5.0  HGB 13.0 14.1  HCT 37.8* 41.0  PLT 70* 73*    Recent Labs  05/05/13 1347  NA 137  K 4.6  CO2 22  GLUCOSE 287*  BUN 12.9  CREATININE 1.2  CALCIUM 10.2     Blood smear review: His peripheral smear was personally reviewed and showed slight platelet clumping and enlarged platelets. His hemoglobin and white cells appeared within normal range.    Assessment and Plan:   57 year old gentleman with chronic fluctuating thrombocytopenia. Differential diagnosis was discussed today which include thrombocytopenia due to platelet clumping, thrombocytopenia due to splenomegaly and liver disease, a meal and thrombocytopenia such as ITP. Medication can also play a role in his thrombocytopenia but I doubt that he has a bone marrow disorder such as myelodysplastic syndrome, leukemia or lymphoma. The standpoint, he is at low risk of bleeding especially with his platelet count of 70,000  or higher and continued observation would be warranted. I see no need for a bone marrow biopsy or any intervention at the time being. If his blood counts do dropped down to a critical level we can certainly give him a trial of steroids to see if that helps. I will also obtain a repeat platelet counts with a citrate based to decrease amount of clumping.  Followup will be in 3 months time.

## 2013-05-05 NOTE — Telephone Encounter (Signed)
gave pt appt for lab and MD for March 2014

## 2013-05-05 NOTE — Progress Notes (Signed)
Please see consult note.  

## 2013-05-07 ENCOUNTER — Ambulatory Visit: Payer: Medicare PPO | Admitting: Oncology

## 2013-05-07 ENCOUNTER — Other Ambulatory Visit: Payer: Medicare PPO | Admitting: Lab

## 2013-05-07 ENCOUNTER — Ambulatory Visit: Payer: Medicare PPO

## 2013-05-17 ENCOUNTER — Ambulatory Visit (INDEPENDENT_AMBULATORY_CARE_PROVIDER_SITE_OTHER): Payer: 59 | Admitting: Nurse Practitioner

## 2013-05-17 ENCOUNTER — Encounter: Payer: Self-pay | Admitting: Nurse Practitioner

## 2013-05-17 VITALS — BP 131/71 | HR 66 | Temp 97.4°F | Ht 71.25 in | Wt 236.0 lb

## 2013-05-17 DIAGNOSIS — G8190 Hemiplegia, unspecified affecting unspecified side: Secondary | ICD-10-CM

## 2013-05-17 DIAGNOSIS — G459 Transient cerebral ischemic attack, unspecified: Secondary | ICD-10-CM

## 2013-05-17 NOTE — Progress Notes (Signed)
PATIENT: Christian Mccall DOB: 1956-02-28   REASON FOR VISIT: routine follow up for TIA HISTORY FROM: patient  HISTORY OF PRESENT ILLNESS: 57 year old male with recurrent right brain TIAs since 10 years ago with vascular risk factors of diabetes, hypertension, hyperlipidemia, peripheral arterial disease and heart disease. Chronic insomnia been followed by Dr. Vickey Huger.   11/12/2012 He is seen for followup today after last visit on 05/22/12. He has multiple complaints today. He is not having any intercurrent stroke or TIA symptoms. He remains on aspirin and Plavix due to his cardiac stents and is tolerated well with only minor easy bleeding. He states his blood pressure and cholesterol have been under good control this as has been his sugars. He complains of increasing neck pain as well as back pain and intermittent left leg weakness. He states after his walk for about 5 minutes his left leg feels saline at times the knee gives out. He feels all right after he has rested for a few minutes. He denies radicular pain but does complain of some dental pain nose in his back as well as the neck. He did have MRI scan of the neck and lumbar spine done in 2011 which had shown mild degenerative disc disease. He denies any recent fall, injury to his back or neck. He also complains of memory difficulties for the last month or 2 with trouble remembering recent information and tasks. He does have history of depression and has been started on Lexapro 10 mg by Dr. Timothy Lasso his primary physician. He is tolerating well without side effects but feels depression is not yet improved. He complains of cramps in his legs as well as restless legs at night. He sees Dr. Richardean Chimera for his chronic sleep complaints.   UPDATE 02/15/13 (LL): Patient returns to office for MRI and NCV/EMG results. MRIs were unchanged from previous studies and NCV/EMG were normal. He reports that sometimes he has low back pain and uses Lidoderm patch which  provides some relief. He reports he has had TIAs, 2 in the last month, with left side facial droop and left arm weakness which resolved with 2 hours. He had a recent follow up with Dr. Darrick Penna and his AAA graft is stable. He reports better mood since the Lexapro dose was increased. He reports he is on three different inhalers now and feels like he cannot get enough air in at times. Dr. Timothy Lasso is managing his respiratory care. He remains on aspirin and Plavix due to his cardiac stents and is tolerated well with only minor easy bleeding. He states his blood pressure and cholesterol have been under good control this as has been his sugars. He reports he tries to stay active, even though he cannot do very much activity without having to rest.  UPDATE 05/17/13 (LL):  Christian Mccall returns for TIA follow up.  He continues to have TIA symptoms occasionally, with left side facial droop and left arm weakness that resolves within an hour.   Since his last visit, he states he was hospitalized around Thanksgiving for chest pain.  His workup was negative for acute MI.  He remains on aspirin and Plavix due to his cardiac stents and is tolerated well with only minor easy bleeding. He states his BP is well controlled, is 131/71 in office today.  He continues to feel very fatigued, he states he falls asleep easily during the day.   REVIEW OF SYSTEMS: Full 14 system review of systems performed and notable only for:  Cardiovascular: chest tightness, shortness of breath Respiratory: short of breath  Neurological: memory loss, headache, facial drooping Sleep: snoring, restless legs, sleep talking, daytime sleepiness  ALLERGIES: No Active Allergies  HOME MEDICATIONS: Outpatient Prescriptions Prior to Visit  Medication Sig Dispense Refill  . ADVAIR DISKUS 100-50 MCG/DOSE AEPB Inhale 1 puff into the lungs every 12 (twelve) hours.       [name] aspirin 325 MG tablet Take 325 mg by mouth daily.        . cholecalciferol (VITAMIN D) 1000  UNITS tablet Take 1,000 Units by mouth daily.      . clonazePAM (KLONOPIN) 1 MG tablet Take 1 mg by mouth at bedtime.      . clopidogrel (PLAVIX) 75 MG tablet Take 75 mg by mouth daily.        . cyanocobalamin (,VITAMIN B-12,) 1000 MCG/ML injection Inject 1,000 mcg into the muscle every 3 (three) months.      . escitalopram (LEXAPRO) 20 MG tablet Take 20 mg by mouth at bedtime.      [name] ezetimibe-simvastatin (VYTORIN) 10-40 MG per tablet Take 1 tablet by mouth at bedtime.        . fenofibrate 160 MG tablet Take 160 mg by mouth daily.       . furosemide (LASIX) 40 MG tablet Take 40 mg by mouth every other day.       [name] glimepiride (AMARYL) 4 MG tablet Take 4 mg by mouth at bedtime.       . lansoprazole (PREVACID) 15 MG capsule Take 30 mg by mouth daily.       [name] losartan (COZAAR) 50 MG tablet Take 50 mg by mouth daily.        . metFORMIN (GLUCOPHAGE) 1000 MG tablet Take 500-1,000 mg by mouth 2 (two) times daily with a meal. Take one tab in am and 1/2 tab at night      . metoprolol (LOPRESSOR) 50 MG tablet Take 50 mg by mouth 2 (two) times daily.       . nitroGLYCERIN (NITRODUR - DOSED IN MG/24 HR) 0.6 mg/hr Place 1 patch onto the skin daily.        . nitroGLYCERIN (NITROSTAT) 0.6 MG SL tablet Place 0.6 mg under the tongue every 5 (five) minutes as needed for chest pain.       . NON FORMULARY Take 1 capsule by mouth daily. [employer] Research Medication      . pramipexole (MIRAPEX) 0.25 MG tablet Take 0.25 mg by mouth at bedtime.       . pregabalin (LYRICA) 100 MG capsule Take 100-200 mg by mouth 2 (two) times daily. One Tab am and two Tabs pm      . ranolazine (RANEXA) 1000 MG SR tablet Take 1,000 mg by mouth 2 (two) times daily.        [name] terazosin (HYTRIN) 1 MG capsule Take 1 mg by mouth at bedtime.        [name] tiotropium (SPIRIVA) 18 MCG inhalation capsule Place 18 mcg into inhaler and inhale daily.       . VENTOLIN HFA 108 (90 BASE) MCG/ACT inhaler Inhale 1 puff into the lungs 2 (two) times daily  as needed. For shortness of breath/difficulty breathing       No facility-administered medications prior to visit.    PAST MEDICAL HISTORY: Past Medical History  Diagnosis Date  . Hypertension   . Hyperlipidemia   . Myocardial infarction 2002  . COPD (chronic obstructive pulmonary disease)   .  AAA (abdominal aortic aneurysm)   . Neuropathy   . CAD (coronary artery disease)   . Diabetes mellitus 2009  . Arthritis   . Joint pain   . Leg pain     with walking and pain in feet while lying flat  . Chest pain   . Shortness of breath 12/06/10    while lying flat and with exertion  . Reflux   . Stroke June 2014    Mini  pt. had several small strokes    PAST SURGICAL HISTORY: Past Surgical History  Procedure Laterality Date  . Coronary artery bypass graft  2002  . Abdominal aortic aneurysm repair  01-16-11    EVAR  . Abdominal aortic aneurysm repair      FAMILY HISTORY: Family History  Problem Relation Age of Onset  . Aneurysm Mother     AAA  . Heart disease Mother     Heart Disease before age 53  . Other Mother     Carotid artery stenosis  . Hyperlipidemia Mother   . Hypertension Mother   . Aneurysm Maternal Aunt     AAA  . Other Maternal Aunt     AAA  . Aneurysm Maternal Uncle     AAA  . Other Maternal Uncle     AAA  . Lupus Sister   . Heart disease Brother     Heart Disease before age 57  . Hypertension Brother   . Heart disease Brother   . Heart attack Brother   . Heart attack Brother     SOCIAL HISTORY: History   Social History  . Marital Status: Married    Spouse Name: reba    Number of Children: 2  . Years of Education: 9   Occupational History  . disability    Social History Main Topics  . Smoking status: Former Smoker -- 2.00 packs/day for 30 years    Types: Cigarettes    Quit date: 06/10/2000  . Smokeless tobacco: Never Used  . Alcohol Use: Yes     Comment: social  . Drug Use: No  . Sexual Activity: No   Other Topics Concern  . Not  on file   Social History Narrative  . No narrative on file     PHYSICAL EXAM  Filed Vitals:   05/17/13 1344  BP: 131/71  Pulse: 66  Temp: 97.4 F (36.3 C)  TempSrc: Oral  Height: 5' 11.25" (1.81 m)  Weight: 236 lb (107.049 kg)   Body mass index is 32.68 kg/(m^2).  Generalized: Well developed, in no acute distress, obese Caucasian male  Head: normocephalic and atraumatic. Oropharynx benign  Neck: Supple, no carotid bruits  Cardiac: Regular rate rhythm, no murmur  Musculoskeletal: No deformity   Neurologic Exam  Mental Status: Awake and fully alert. Oriented to place and time. Recent and remote memory intact. Attention span, concentration and fund of knowledge appropriate. Mood and affect appropriate.  Cranial Nerves: Pupils equal, briskly reactive to light. Extraocular movements full without nystagmus. Visual fields full to confrontation. Hearing intact. Facial sensation intact. Face, tongue, palate moves normally and symmetrically.  Motor: Normal bulk and tone. Normal strength in all tested extremity muscles. Mild weakness of left foot great toe extensor and ankle dorsiflexors. Unable to walk on his heels without support  Sensory.: Mildly diminished touch, pinprick and vibration sensation over the toes left greater than right feet.  Coordination: Rapid alternating movements normal in all extremities. Finger-to-nose and heel-to-shin performed accurately bilaterally.  Gait and  Station: Arises from chair without difficulty. Stance is normal. Gait demonstrates normal stride length and balance. Able to heel, toe walk without difficulty. Romberg negative.  Reflexes: 1+ and symmetric except both ankle jerks are depressed. Toes downgoing.   DIAGNOSTIC DATA (LABS, IMAGING, TESTING) - I reviewed patient records, labs, notes, testing and imaging myself where available.  Lab Results  Component Value Date   WBC 5.0 05/05/2013   HGB 14.1 05/05/2013   HCT 41.0 05/05/2013   MCV 88.7  05/05/2013   PLT 73* 05/05/2013      Component Value Date/Time   NA 137 05/05/2013 1347   NA 136 05/02/2013 0410   K 4.6 05/05/2013 1347   K 3.5 05/02/2013 0410   CL 100 05/02/2013 0410   CO2 22 05/05/2013 1347   CO2 26 05/02/2013 0410   GLUCOSE 287* 05/05/2013 1347   GLUCOSE 178* 05/02/2013 0410   BUN 12.9 05/05/2013 1347   BUN 15 05/02/2013 0410   CREATININE 1.2 05/05/2013 1347   CREATININE 0.99 05/02/2013 0410   CREATININE 1.01 02/01/2013 1210   CALCIUM 10.2 05/05/2013 1347   CALCIUM 9.0 05/02/2013 0410   PROT 7.3 05/05/2013 1347   PROT 6.2 01/24/2011 0759   ALBUMIN 3.7 05/05/2013 1347   ALBUMIN 3.2* 01/24/2011 0759   AST 33 05/05/2013 1347   AST 21 01/24/2011 0759   ALT 32 05/05/2013 1347   ALT 17 01/24/2011 0759   ALKPHOS 69 05/05/2013 1347   ALKPHOS 39 01/24/2011 0759   BILITOT 0.68 05/05/2013 1347   BILITOT 0.5 01/24/2011 0759   GFRNONAA 90* 05/02/2013 0410   GFRAA >90 05/02/2013 0410   Lab Results  Component Value Date   CHOL 129 05/02/2013   HDL 69 05/02/2013   LDLCALC 14 05/02/2013   TRIG 230* 05/02/2013   CHOLHDL 1.9 05/02/2013   Lab Results  Component Value Date   HGBA1C 7.9* 05/01/2013   No results found for this basename: VITAMINB12   Lab Results  Component Value Date   TSH 2.138 05/01/2013   No results found for this basename: ESRSEDRATE   11/26/12 EMG/NCV Nerve conduction studies done on both lower extremities and on the right upper extremity were unremarkable. There is no evidence of a neuropathy. EMG evaluation of the left lower extremity was unremarkable. No evidence of a left lumbosacral radiculopathy is seen.  11/12/12 MRI cervical spine (without)  Mildly abnormal MRI cervical spine (without) demonstrating disc bulging at C4-5, C5-6 and C6 7. No spinal stenosis or foraminal narrowing. No change from MRI on 03/12/12. 11/12/12 MRI lumbar spine (without)  Abnormal MRI lumbar spine (without) demonstrating: Minimal degenerative spondylosis L1-2 down  to L5-S1. No change from last MRI on 03/05/10. Enlarged aorta (5.1cm) status post endograft repair (per history), which is a new finding from last MRI on 03/05/10.  ASSESSMENT AND PLAN 57 year old male with recurrent right brain TIA with vascular risk factors of diabetes, hypertension, hyperlipidemia, long smoking history, peripheral arterial disease and heart disease. Chronic insomnia been followed by Dr. Richardean Chimera. Recurrent neck and back pain from degenerative spine disease, but no further progression on repeat scans.   PLAN:  Continue aspirin and Plavix due to his cardiac stents for stroke prevention and strict control of hypertension with blood pressure goal below 120/80, diabetes with hemoglobin A1c goal below 6.5%, lipids with LDL cholesterol goal below 100 mg percent.  Patient was referred for the [facility] Pharmacogenetic Testing study. Return for followup in 6 months with Dr. Pearlean Brownie.  Tawny Asal Stormie Ventola, MSN,  NP-C 05/17/2013, 4:27 PM [facility] [address], [address] [place], [place] [address] [phone]  Note: This document was prepared with digital dictation and possible smart phrase technology. Any transcriptional errors that result from this process are unintentional.

## 2013-05-17 NOTE — Patient Instructions (Signed)
PLAN:  Continue aspirin and Plavix due to his cardiac stents for stroke prevention and strict control of hypertension with blood pressure goal below 120/80, diabetes with hemoglobin A1c goal below 6.5%, lipids with LDL cholesterol goal below 100 mg percent.   Return for followup in 6 months with Lynn,NP

## 2013-06-08 ENCOUNTER — Encounter: Payer: Self-pay | Admitting: Neurology

## 2013-08-05 ENCOUNTER — Ambulatory Visit: Payer: 59 | Admitting: Oncology

## 2013-08-12 ENCOUNTER — Encounter (INDEPENDENT_AMBULATORY_CARE_PROVIDER_SITE_OTHER): Payer: Self-pay

## 2013-08-12 ENCOUNTER — Other Ambulatory Visit (HOSPITAL_BASED_OUTPATIENT_CLINIC_OR_DEPARTMENT_OTHER): Payer: 59

## 2013-08-12 ENCOUNTER — Ambulatory Visit (HOSPITAL_BASED_OUTPATIENT_CLINIC_OR_DEPARTMENT_OTHER): Payer: 59 | Admitting: Oncology

## 2013-08-12 ENCOUNTER — Encounter: Payer: Self-pay | Admitting: Oncology

## 2013-08-12 VITALS — BP 147/72 | HR 66 | Temp 98.1°F | Resp 19 | Ht 71.25 in | Wt 229.2 lb

## 2013-08-12 DIAGNOSIS — D696 Thrombocytopenia, unspecified: Secondary | ICD-10-CM

## 2013-08-12 DIAGNOSIS — R1011 Right upper quadrant pain: Secondary | ICD-10-CM

## 2013-08-12 LAB — CBC WITH DIFFERENTIAL/PLATELET
BASO%: 0.9 % (ref 0.0–2.0)
Basophils Absolute: 0.1 10*3/uL (ref 0.0–0.1)
EOS ABS: 0.2 10*3/uL (ref 0.0–0.5)
EOS%: 2.3 % (ref 0.0–7.0)
HCT: 41.8 % (ref 38.4–49.9)
HGB: 14 g/dL (ref 13.0–17.1)
LYMPH%: 32.8 % (ref 14.0–49.0)
MCH: 29.8 pg (ref 27.2–33.4)
MCHC: 33.6 g/dL (ref 32.0–36.0)
MCV: 88.8 fL (ref 79.3–98.0)
MONO#: 0.6 10*3/uL (ref 0.1–0.9)
MONO%: 9.5 % (ref 0.0–14.0)
NEUT%: 54.5 % (ref 39.0–75.0)
NEUTROS ABS: 3.7 10*3/uL (ref 1.5–6.5)
Platelets: 82 10*3/uL — ABNORMAL LOW (ref 140–400)
RBC: 4.71 10*6/uL (ref 4.20–5.82)
RDW: 14 % (ref 11.0–14.6)
WBC: 6.7 10*3/uL (ref 4.0–10.3)
lymph#: 2.2 10*3/uL (ref 0.9–3.3)

## 2013-08-12 NOTE — Progress Notes (Signed)
Hematology and Oncology Follow Up Visit  Christian Mccall [id number] [birth year] 58 y.o. 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   Current therapy: Observation and surveillance.  Interim History:  Christian Mccall presents today for a followup visit. He is a very nice man I saw consultation in November of 2014 for the evaluation of chronic fluctuating thrombocytopenia. Since his last visit, he has not had any new complaints. He does occasionally report the right upper quadrant pain with episodic nausea but without vomiting. He has not reported any hematochezia or melena. Has not reported any epistaxis or petechiae. Has not reported any constitutional symptoms of weight loss appetite changes or changes in his performance status.  Medications: I have reviewed the patient's current medications.  Current Outpatient Prescriptions  Medication Sig Dispense Refill  . ADVAIR DISKUS 100-50 MCG/DOSE AEPB Inhale 1 puff into the lungs every 12 (twelve) hours.       [name] aspirin 325 MG tablet Take 325 mg by mouth daily.        . cholecalciferol (VITAMIN D) 1000 UNITS tablet Take 1,000 Units by mouth daily.      . clonazePAM (KLONOPIN) 1 MG tablet Take 1 mg by mouth at bedtime.      . clopidogrel (PLAVIX) 75 MG tablet Take 75 mg by mouth daily.        . cyanocobalamin (,VITAMIN B-12,) 1000 MCG/ML injection Inject 1,000 mcg into the muscle every 3 (three) months.      . escitalopram (LEXAPRO) 20 MG tablet Take 20 mg by mouth at bedtime.      [name] ezetimibe-simvastatin (VYTORIN) 10-40 MG per tablet Take 1 tablet by mouth at bedtime.        . fenofibrate 160 MG tablet Take 160 mg by mouth daily.       . furosemide (LASIX) 40 MG tablet Take 40 mg by mouth every other day.       [name] glimepiride (AMARYL) 4 MG tablet Take 4 mg by mouth at bedtime.       . lansoprazole (PREVACID) 15 MG capsule Take 30 mg by mouth daily.        [name] losartan (COZAAR) 50 MG tablet Take 50 mg by mouth daily.        . metFORMIN (GLUCOPHAGE) 1000 MG tablet Take 500-1,000 mg by mouth 2 (two) times daily with a meal. Take one tab in am and 1/2 tab at night      . metoprolol (LOPRESSOR) 50 MG tablet Take 50 mg by mouth 2 (two) times daily.       . nitroGLYCERIN (NITRODUR - DOSED IN MG/24 HR) 0.6 mg/hr Place 1 patch onto the skin daily.        . nitroGLYCERIN (NITROSTAT) 0.6 MG SL tablet Place 0.6 mg under the tongue every 5 (five) minutes as needed for chest pain.       . NON FORMULARY Take 1 capsule by mouth daily. [employer] Medication      . pramipexole (MIRAPEX) 0.25 MG tablet Take 0.25 mg by mouth at bedtime.       . pregabalin (LYRICA) 100 MG capsule Take 100-200 mg by mouth 2 (two) times daily. One Tab am and two Tabs pm      . ranolazine (RANEXA) 1000 MG SR tablet Take 1,000 mg by mouth 2 (two) times daily.        [name] terazosin (HYTRIN) 1 MG capsule  Take 1 mg by mouth at bedtime.        [name] tiotropium (SPIRIVA) 18 MCG inhalation capsule Place 18 mcg into inhaler and inhale daily.       . VENTOLIN HFA 108 (90 BASE) MCG/ACT inhaler Inhale 1 puff into the lungs 2 (two) times daily as needed. For shortness of breath/difficulty breathing       No current facility-administered medications for this visit.     Allergies: No Active Allergies  Past Medical History, Surgical history, Social history, and Family History were reviewed and updated.  Review of Systems: Constitutional:  Negative for fever, chills, night sweats, anorexia, weight loss, pain. Cardiovascular: no chest pain or dyspnea on exertion Respiratory: no cough, shortness of breath, or wheezing Neurological: no TIA or stroke symptoms Dermatological: negative ENT: negative Skin: Negative. Gastrointestinal: no abdominal pain, change in bowel habits, or black or bloody stools Genito-Urinary: no dysuria, trouble voiding, or hematuria Hematological and Lymphatic:  negative Breast: negative Musculoskeletal: negative Remaining ROS negative. Physical Exam: Blood pressure 147/72, pulse 66, temperature 98.1 F (36.7 C), temperature source Oral, resp. rate 19, height 5' 11.25" (1.81 m), weight 229 lb 3.2 oz (103.964 kg). ECOG:  General appearance: alert, cooperative and appears stated age Head: Normocephalic, without obvious abnormality Neck: no adenopathy, no carotid bruit, no JVD and supple, symmetrical, trachea midline Lymph nodes: Cervical, supraclavicular, and axillary nodes normal. Heart:regular rate and rhythm, S1, S2 normal, no murmur, click, rub or gallop Lung:Heart exam - S1, S2 normal, no murmur, no gallop, rate regular Abdomin: soft, non-tender, without masses or organomegaly EXT:no erythema, induration, or nodules   Lab Results: Lab Results  Component Value Date   WBC 6.7 08/12/2013   HGB 14.0 08/12/2013   HCT 41.8 08/12/2013   MCV 88.8 08/12/2013   PLT 82* 08/12/2013     Chemistry      Component Value Date/Time   NA 137 05/05/2013 1347   NA 136 05/02/2013 0410   K 4.6 05/05/2013 1347   K 3.5 05/02/2013 0410   CL 100 05/02/2013 0410   CO2 22 05/05/2013 1347   CO2 26 05/02/2013 0410   BUN 12.9 05/05/2013 1347   BUN 15 05/02/2013 0410   CREATININE 1.2 05/05/2013 1347   CREATININE 0.99 05/02/2013 0410   CREATININE 1.01 02/01/2013 1210      Component Value Date/Time   CALCIUM 10.2 05/05/2013 1347   CALCIUM 9.0 05/02/2013 0410   ALKPHOS 69 05/05/2013 1347   ALKPHOS 39 01/24/2011 0759   AST 33 05/05/2013 1347   AST 21 01/24/2011 0759   ALT 32 05/05/2013 1347   ALT 17 01/24/2011 0759   BILITOT 0.68 05/05/2013 1347   BILITOT 0.5 01/24/2011 0759        Impression and Plan:  58 year old gentleman with the following issues:  1. Thrombocytopenia: His platelet counts today is 82,000 which not a lot of different than his previous counts historically. His protein hours as low as 80-90,000 back in August of 2012. The differential  diagnosis includes autoimmune thrombocytopenia versus related to liver disease and a likely as a sign of a hematological disorder. His white cell count and his hemoglobin as well as his differential is perfectly normal. At this point I see no indication for further management her workup. He has no bleeding symptoms and with a platelet count that is 80,000 he could withstand most surgical procedures. Should he have a more invasive surgeries such as neurosurgical procedure, we can certainly give him a trial of steroids  to boost his blood counts in anticipation of that procedure. Otherwise, periodic measurements will be sufficient and he is interested in that and having that done with his primary care physician. I would happy to see him in the future as needed.  2. The right upper quadrant pain and occasional nausea: Mrs. not related to his laboratory your blood finding unlikely to be related to cholelithiasis. I've asked him to bring this up to his primary care physician who he will be seeing in the next week.  [facility], MD 3/5/20153:23 PM

## 2013-11-15 ENCOUNTER — Ambulatory Visit: Payer: Medicare HMO | Admitting: Neurology

## 2013-11-16 ENCOUNTER — Ambulatory Visit (INDEPENDENT_AMBULATORY_CARE_PROVIDER_SITE_OTHER): Payer: 59 | Admitting: Neurology

## 2013-11-16 ENCOUNTER — Encounter: Payer: Self-pay | Admitting: Neurology

## 2013-11-16 ENCOUNTER — Encounter (INDEPENDENT_AMBULATORY_CARE_PROVIDER_SITE_OTHER): Payer: Self-pay

## 2013-11-16 VITALS — BP 120/68 | HR 65 | Ht 71.0 in | Wt 222.5 lb

## 2013-11-16 DIAGNOSIS — G3184 Mild cognitive impairment, so stated: Secondary | ICD-10-CM

## 2013-11-16 NOTE — Patient Instructions (Signed)
I had a long discussion with the patient and wife regarding his new complaints of worsening short-term memory, discussed plan for evaluation, treatment and answered questions. Continue aspirin and Plavix for his stroke prevention for his recurrent TIAs which are likely from small vessel disease. Maintain aggressive control of diabetes with hemoglobin A1c cold below 6.5% and lipids with LDL cholesterol goal below 170 mg percent. I advised him to diet, exercise and lose weight. Check vitamin B12, TSH, RPR, EEG and brain MRI scan for reversible causes of memory loss. Start taking fish oil 2 capsules daily. Return for followup in 3 months with Charlott Holler, nurse practitioner or call earlier if necessary.  Mild Neurocognitive Disorder Mild neurocognitive disorder (formerly known as mild cognitive impairment) is a mental disorder. It is a slight abnormal decrease in mental function. The areas of mental function affected may include memory, thought, communication, behavior, and completion of tasks. The decrease is noticeable and measurable but does not interfere substantially with your daily activities. Mild neurocognitive disorder typically occurs in people older than 60 years but can occur earlier. It is not as serious as major neurocognitive disorder (formerly known as dementia) but may lead to a more serious neurocognitive disorder. However, in some cases the condition does not progress. A few people with mild neurocognitive disorder even improve. CAUSES  There are a number of different causes of mild neurocognitive disorder:   Brain disorders associated with abnormal protein deposits, such as Alzheimer disease, Pick disease, and Lewy body disease.  Brain disorders associated with abnormal movement, such as Parkinson disease and Huntington disease.  Diseases affecting blood vessels in the brain and resulting in mini-strokes.  Certain infections such as human immunodeficiency virus (HIV)  infection.  Traumatic brain injury.  Other medical conditions such as brain tumors, underactive thyroid (hypothyroidism), and vitamin B12 deficiency.  Use of certain prescription medicine and "recreational" drugs. SYMPTOMS  Symptoms of mild neurocognitive disorder include:  Difficulty remembering You may forget details of recent events, names, or phone numbers. You may forget important social events and appointments or repeatedly forget where you put your car keys.  Difficulty thinking and solving problems You may have difficulty with complex tasks such as paying bills or driving in unfamiliar locations.  Difficulty communicating You may have difficulty finding the right word, naming an object, forming a sentence that makes sense, or understanding what you read or hear.  Changes in your behavior or personality You may lose interest in the things that you used to enjoy or withdraw from social situations. You may get angry more easily than usual. You may act before thinking. You may do things in public that you would not usually do. You may hear or see things that are not real (hallucinations). You may believe falsely that others are trying to hurt you (paranoia). DIAGNOSIS Mild neurocognitive disorder is diagnosed through an assessment by your health care provider. Your health care provider will ask you and your family, friends, or coworkers questions about your symptoms, their frequency, their duration and progression, and the effect they are having on your life. Your health care provider may refer you to a neurologist or mental health specialist for a detailed evaluation of your mental functions (neuropsychological testing).  To identify the cause of your mild neurocognitive disorder, your health care provider may:  Obtain a detailed medical history.  Ask about alcohol and drug use, including prescription medicine.  Perform a physical exam.  Order blood tests and brain imaging  exams. TREATMENT  Mild neurocognitive disorder caused by infections, use of certain medicines or recreational drugs, and certain medical conditions may improve with treatment of the condition that is causing mild neurocognitive disorder. Mild neurocognitive disorder resulting from other causes generally does not improve and may worsen. In these cases, the goal of treatment is to slow progression of the disorder and help you cope with the loss of cognitive function. Treatments in these cases include:   Medicine Medication helps mainly with memory loss and behavioral symptoms.   Talk therapy Talk therapy provides education, emotional support, memory aids, and other ways of compensating for decreases in mental function.   Lifestyle changes These include regular exercise, a healthy diet (including essential omega-3 fatty acids), intellectual stimulation, and increased social interaction. Document Released: 01/27/2013 Document Reviewed: 01/27/2013 [facility] Patient Information 2014 [place].

## 2013-11-16 NOTE — Progress Notes (Signed)
PATIENT: Christian Mccall Mccall DOB: 01/16/1956   REASON FOR VISIT: routine follow up for TIA HISTORY FROM: patient  HISTORY OF PRESENT ILLNESS: 58 year old male with recurrent right brain TIAs since 10 years ago with vascular risk factors of diabetes, hypertension, hyperlipidemia, peripheral arterial disease and heart disease. Chronic insomnia been followed by Christian Mccall Mccall.   11/12/2012 He is seen for followup today after last visit on 05/22/12. He has multiple complaints today. He is not having any intercurrent stroke or TIA symptoms. He remains on aspirin and Plavix due to his cardiac stents and is tolerated well with only minor easy bleeding. He states his blood pressure and cholesterol have been under good control this as has been his sugars. He complains of increasing neck pain as well as back pain and intermittent left leg weakness. He states after his walk for about 5 minutes his left leg feels saline at times the knee gives out. He feels all right after he has rested for a few minutes. He denies radicular pain but does complain of some dental pain nose in his back as well as the neck. He did have MRI scan of the neck and lumbar spine done in 2011 which had shown mild degenerative disc disease. He denies any recent fall, injury to his back or neck. He also complains of memory difficulties for the last month or 2 with trouble remembering recent information and tasks. He does have history of depression and has been started on Lexapro 10 mg by Christian Mccall Mccall his primary physician. He is tolerating well without side effects but feels depression is not yet improved. He complains of cramps in his legs as well as restless legs at night. He sees Christian Mccall Mccall for his chronic sleep complaints.   UPDATE 02/15/13 (LL): Patient returns to office for MRI and NCV/EMG results. MRIs were unchanged from previous studies and NCV/EMG were normal. He reports that sometimes he has low back pain and uses Lidoderm patch which  provides some relief. He reports he has had TIAs, 2 in the last month, with left side facial droop and left arm weakness which resolved with 2 hours. He had a recent follow up with Christian Mccall Mccall and his AAA graft is stable. He reports better mood since the Lexapro dose was increased. He reports he is on three different inhalers now and feels like he cannot get enough air in at times. Christian Mccall Mccall is managing his respiratory care. He remains on aspirin and Plavix due to his cardiac stents and is tolerated well with only minor easy bleeding. He states his blood pressure and cholesterol have been under good control this as has been his sugars. He reports he tries to stay active, even though he cannot do very much activity without having to rest.  UPDATE 05/17/13 (LL):  Christian Mccall returns for TIA follow up.  He continues to have TIA symptoms occasionally, with left side facial droop and left arm weakness that resolves within an hour.   Since his last visit, he states he was hospitalized around Thanksgiving for chest pain.  His workup was negative for acute MI.  He remains on aspirin and Plavix due to his cardiac stents and is tolerated well with only minor easy bleeding. He states his BP is well controlled, is 131/71 in office today.  He continues to feel very fatigued, he states he falls asleep easily during the day.  Update 11/16/2013 : He returns for followup after last visit 6 months ago. He states  had 2 more TIAs 2 months and   2 weeks ago. They were similar and he noticed drooping of his left face as well as mild left-sided weakness lasting from 2-10 minutes. He did not seek medical help for these. He has had several of these in the past. He continues to be on aspirin and Plavix  because of cardiac disease. He states his diabetes is not well controlled and he an A1c was 8.0  Just 2 weeks ago. He is participating in the [place] cholesterol study ( evaceterapib versus placebo ) through [employer]. She has been  complaining of memory difficulties for the last 6 months which seem to be problematic. He still remains independent in activities of daily living but is disturbed by inability to remember recent information on making memories. He does complain of excessive daytime sleepiness as well as restless sleep. He does snore. He has been evaluated by Christian Mccall Mccall in the past for sleep apnea but the  PSG test was negative. He has not had any evaluation for treatable causes of memory loss. REVIEW OF SYSTEMS: Full 14 system review of systems performed and notable only for:  Ringing in ears, eye pain, shortness of breath, chest tightness, chest pain, memory loss, headache and no other systems negative ALLERGIES: No Active Allergies  HOME MEDICATIONS: Outpatient Prescriptions Prior to Visit  Medication Sig Dispense Refill  . ADVAIR DISKUS 100-50 MCG/DOSE AEPB Inhale 1 puff into the lungs every 12 (twelve) hours.       [name] aspirin 325 MG tablet Take 325 mg by mouth daily.        . cholecalciferol (VITAMIN D) 1000 UNITS tablet Take 1,000 Units by mouth daily.      . clonazePAM (KLONOPIN) 1 MG tablet Take 1 mg by mouth at bedtime.      . clopidogrel (PLAVIX) 75 MG tablet Take 75 mg by mouth daily.        . cyanocobalamin (,VITAMIN B-12,) 1000 MCG/ML injection Inject 1,000 mcg into the muscle every 3 (three) months.      . escitalopram (LEXAPRO) 20 MG tablet Take 20 mg by mouth at bedtime.      [name] ezetimibe-simvastatin (VYTORIN) 10-40 MG per tablet Take 1 tablet by mouth at bedtime.        . fenofibrate 160 MG tablet Take 160 mg by mouth daily.       . furosemide (LASIX) 40 MG tablet Take 40 mg by mouth every other day.       [name] glimepiride (AMARYL) 4 MG tablet Take 4 mg by mouth at bedtime.       . lansoprazole (PREVACID) 15 MG capsule Take 30 mg by mouth daily.       [name] losartan (COZAAR) 50 MG tablet Take 50 mg by mouth daily.        . metFORMIN (GLUCOPHAGE) 1000 MG tablet Take 500-1,000 mg by mouth 2 (two) times  daily with a meal. Take one tab in am and 1 tab at night      . metoprolol (LOPRESSOR) 50 MG tablet Take 50 mg by mouth 2 (two) times daily.       . nitroGLYCERIN (NITRODUR - DOSED IN MG/24 HR) 0.6 mg/hr Place 1 patch onto the skin daily.        . NON FORMULARY Take 1 capsule by mouth daily. [employer] Medication      . pramipexole (MIRAPEX) 0.25 MG tablet Take 0.25 mg by mouth at bedtime.       [name]  pregabalin (LYRICA) 100 MG capsule Take 100-200 mg by mouth 2 (two) times daily. One Tab am and two Tabs pm      . ranolazine (RANEXA) 1000 MG SR tablet Take 1,000 mg by mouth 2 (two) times daily.        [name] terazosin (HYTRIN) 1 MG capsule Take 1 mg by mouth at bedtime.        [name] tiotropium (SPIRIVA) 18 MCG inhalation capsule Place 18 mcg into inhaler and inhale daily.       . VENTOLIN HFA 108 (90 BASE) MCG/ACT inhaler Inhale 1 puff into the lungs 2 (two) times daily as needed. For shortness of breath/difficulty breathing      . nitroGLYCERIN (NITROSTAT) 0.6 MG SL tablet Place 0.6 mg under the tongue every 5 (five) minutes as needed for chest pain.        No facility-administered medications prior to visit.    PAST MEDICAL HISTORY: Past Medical History  Diagnosis Date  . Hypertension   . Hyperlipidemia   . Myocardial infarction 2002  . COPD (chronic obstructive pulmonary disease)   . AAA (abdominal aortic aneurysm)   . Neuropathy   . CAD (coronary artery disease)   . Diabetes mellitus 2009  . Arthritis   . Joint pain   . Leg pain     with walking and pain in feet while lying flat  . Chest pain   . Shortness of breath 12/06/10    while lying flat and with exertion  . Reflux   . Stroke June 2014    Mini  pt. had several small strokes    PAST SURGICAL HISTORY: Past Surgical History  Procedure Laterality Date  . Coronary artery bypass graft  2002  . Abdominal aortic aneurysm repair  01-16-11    EVAR  . Abdominal aortic aneurysm repair      FAMILY HISTORY: Family History    Problem Relation Age of Onset  . Aneurysm Mother     AAA  . Heart disease Mother     Heart Disease before age 88  . Other Mother     Carotid artery stenosis  . Hyperlipidemia Mother   . Hypertension Mother   . Aneurysm Maternal Aunt     AAA  . Other Maternal Aunt     AAA  . Aneurysm Maternal Uncle     AAA  . Other Maternal Uncle     AAA  . Lupus Sister   . Heart disease Brother     Heart Disease before age 71  . Hypertension Brother   . Heart disease Brother   . Heart attack Brother   . Heart attack Brother     SOCIAL HISTORY: History   Social History  . Marital Status: Married    Spouse Name: reba    Number of Children: 2  . Years of Education: 9   Occupational History  . disability    Social History Main Topics  . Smoking status: Former Smoker -- 2.00 packs/day for 30 years    Types: Cigarettes    Quit date: 06/10/2000  . Smokeless tobacco: Never Used  . Alcohol Use: Yes     Comment: social  . Drug Use: No  . Sexual Activity: No   Other Topics Concern  . Not on file   Social History Narrative  . No narrative on file     PHYSICAL EXAM  Filed Vitals:   11/16/13 1354  BP: 120/68  Pulse: 65  Height: 5\' 11"  (  1.803 m)  Weight: 222 lb 8 oz (100.925 kg)   Body mass index is 31.05 kg/(m^2).  Generalized: Well developed, in no acute distress, obese Caucasian male  Head: normocephalic and atraumatic. Oropharynx benign  Neck: Supple, no carotid bruits  Cardiac: Regular rate rhythm, no murmur  Musculoskeletal: No deformity   Neurologic Exam  Mental Status: Awake and fully alert. Oriented to place and time. Recent and remote memory intact. Attention span, concentration and fund of knowledge appropriate. Mood and affect appropriate. Status exam 25/30 with deficits in calculation and recall. Fluency test 13. Clock drawing 4/4. Geriatric depression scale 4 only Cranial Nerves: Pupils equal, briskly reactive to light. Extraocular movements full without  nystagmus. Visual fields full to confrontation. Hearing intact. Facial sensation intact. Face, tongue, palate moves normally and symmetrically.  Motor: Normal bulk and tone. Normal strength in all tested extremity muscles. Mild weakness of left foot great toe extensor and ankle dorsiflexors. Unable to walk on his heels without support  Sensory.: Mildly diminished touch, pinprick and vibration sensation over the toes left greater than right feet. Positive Romberg sign Coordination: Rapid alternating movements normal in all extremities. Finger-to-nose and heel-to-shin performed accurately bilaterally.  Gait and Station: Arises from chair without difficulty. Stance is normal. Gait demonstrates normal stride length and balance. Unable to heel, toe walk without difficulty. Romberg positive.  Reflexes: 1+ and symmetric except both ankle jerks are depressed. Toes downgoing.   DIAGNOSTIC DATA (LABS, IMAGING, TESTING) - I reviewed patient records, labs, notes, testing and imaging myself where available.  Lab Results  Component Value Date   WBC 6.7 08/12/2013   HGB 14.0 08/12/2013   HCT 41.8 08/12/2013   MCV 88.8 08/12/2013   PLT 82* 08/12/2013      Component Value Date/Time   NA 137 05/05/2013 1347   NA 136 05/02/2013 0410   K 4.6 05/05/2013 1347   K 3.5 05/02/2013 0410   CL 100 05/02/2013 0410   CO2 22 05/05/2013 1347   CO2 26 05/02/2013 0410   GLUCOSE 287* 05/05/2013 1347   GLUCOSE 178* 05/02/2013 0410   BUN 12.9 05/05/2013 1347   BUN 15 05/02/2013 0410   CREATININE 1.2 05/05/2013 1347   CREATININE 0.99 05/02/2013 0410   CREATININE 1.01 02/01/2013 1210   CALCIUM 10.2 05/05/2013 1347   CALCIUM 9.0 05/02/2013 0410   PROT 7.3 05/05/2013 1347   PROT 6.2 01/24/2011 0759   ALBUMIN 3.7 05/05/2013 1347   ALBUMIN 3.2* 01/24/2011 0759   AST 33 05/05/2013 1347   AST 21 01/24/2011 0759   ALT 32 05/05/2013 1347   ALT 17 01/24/2011 0759   ALKPHOS 69 05/05/2013 1347   ALKPHOS 39 01/24/2011 0759   BILITOT  0.68 05/05/2013 1347   BILITOT 0.5 01/24/2011 0759   GFRNONAA 90* 05/02/2013 0410   GFRAA >90 05/02/2013 0410   Lab Results  Component Value Date   CHOL 129 05/02/2013   HDL 69 05/02/2013   LDLCALC 14 05/02/2013   TRIG 230* 05/02/2013   CHOLHDL 1.9 05/02/2013   Lab Results  Component Value Date   HGBA1C 7.9* 05/01/2013   No results found for this basename: VITAMINB12   Lab Results  Component Value Date   TSH 2.138 05/01/2013   No results found for this basename: ESRSEDRATE   11/26/12 EMG/NCV Nerve conduction studies done on both lower extremities and on the right upper extremity were unremarkable. There is no evidence of a neuropathy. EMG evaluation of the left lower extremity was unremarkable. No evidence  of a left lumbosacral radiculopathy is seen.  11/12/12 MRI cervical spine (without)  Mildly abnormal MRI cervical spine (without) demonstrating disc bulging at C4-5, C5-6 and C6 7. No spinal stenosis or foraminal narrowing. No change from MRI on 03/12/12. 11/12/12 MRI lumbar spine (without)  Abnormal MRI lumbar spine (without) demonstrating: Minimal degenerative spondylosis L1-2 down to L5-S1. No change from last MRI on 03/05/10. Enlarged aorta (5.1cm) status post endograft repair (per history), which is a new finding from last MRI on 03/05/10.  ASSESSMENT AND PLAN 58 year old male with recurrent right brain TIA with vascular risk factors of diabetes, hypertension, hyperlipidemia, long smoking history, peripheral arterial disease and heart disease. Chronic insomnia been followed by Christian Mccall Mccall. Recurrent neck and back pain from degenerative spine disease,  New  memory difficulties which need evaluation PLAN:  I had a long discussion with the patient and wife regarding his new complaints of worsening short-term memory, discussed plan for evaluation, treatment and answered questions. Continue aspirin and Plavix for his stroke prevention for his recurrent TIAs which are likely from small  vessel disease. Maintain aggressive control of diabetes with hemoglobin A1c cold below 6.5% and lipids with LDL cholesterol goal below 170 mg percent. I advised him to diet, exercise and lose weight. Check vitamin B12, TSH, RPR, EEG and brain MRI scan for reversible causes of memory loss. Start taking fish oil 2 capsules daily. Return for followup in 3 months with Charlott Holler, nurse practitioner or call earlier if necessary.  Antony Contras, MD  11/16/2013, 9:06 PM [facility] [address] [place], [place] [address] [phone]  Note: This document was prepared with digital dictation and possible smart phrase technology. Any transcriptional errors that result from this process are unintentional.

## 2013-11-18 ENCOUNTER — Telehealth: Payer: Self-pay | Admitting: Neurology

## 2013-11-18 NOTE — Telephone Encounter (Signed)
Christian Mccall with [facility] @ [phone], requesting return call from [place].

## 2013-11-20 LAB — VITAMIN B12: Vitamin B-12: 335 pg/mL (ref 211–946)

## 2013-11-20 LAB — RPR, QUANT. (REFLEX): Rapid Plasma Reagin, Quant: 1:16 {titer} — ABNORMAL HIGH

## 2013-11-20 LAB — RPR TITER

## 2013-11-20 LAB — SYPHILIS: RPR W/REFLEX TO RPR TITER AND TREPONEMAL ANTIBODIES, TRADITIONAL SCREENING AND DIAGNOSIS ALGORITHM: RPR: REACTIVE — AB

## 2013-11-20 LAB — TSH: TSH: 2.08 u[IU]/mL (ref 0.450–4.500)

## 2013-11-20 LAB — SEDIMENTATION RATE: Sed Rate: 30 mm/hr (ref 0–30)

## 2013-11-22 NOTE — Telephone Encounter (Signed)
I spoke to C[place] with [facility] and relayed the plan that pt has appt for LP on Wednesday 11-24-13 with Dr. Leonie Man.   CSF to be sent for analysis.  Treatment may be provided by Dr. Virgina Jock, since Dr. Leonie Man will be out of office.

## 2013-11-22 NOTE — Telephone Encounter (Signed)
I called and LMVM for Christian Mccall at number below.

## 2013-11-22 NOTE — Telephone Encounter (Signed)
Appt made with pt on 11-24-13 for LP relating to +VDRL.  Stopped plavix and aspirin today. Pt confirmed appt.   Has EEG 11-23-13.

## 2013-11-23 ENCOUNTER — Other Ambulatory Visit: Payer: Self-pay | Admitting: Neurology

## 2013-11-23 ENCOUNTER — Other Ambulatory Visit (INDEPENDENT_AMBULATORY_CARE_PROVIDER_SITE_OTHER): Payer: 59 | Admitting: Radiology

## 2013-11-23 DIAGNOSIS — G3184 Mild cognitive impairment, so stated: Secondary | ICD-10-CM

## 2013-11-24 ENCOUNTER — Encounter: Payer: Self-pay | Admitting: Neurology

## 2013-11-24 ENCOUNTER — Ambulatory Visit (INDEPENDENT_AMBULATORY_CARE_PROVIDER_SITE_OTHER): Payer: 59 | Admitting: Neurology

## 2013-11-24 VITALS — BP 113/67 | HR 75 | Ht 71.0 in | Wt 222.0 lb

## 2013-11-24 DIAGNOSIS — A528 Late syphilis, latent: Secondary | ICD-10-CM

## 2013-11-24 DIAGNOSIS — A539 Syphilis, unspecified: Secondary | ICD-10-CM

## 2013-11-24 HISTORY — DX: Late syphilis, latent: A52.8

## 2013-11-24 NOTE — Progress Notes (Signed)
PATIENT: Christian Mccall DOB: [birth year]   REASON FOR VISIT: routine follow up for TIA HISTORY FROM: patient  HISTORY OF PRESENT ILLNESS: 58 year old male with recurrent right brain TIAs since 10 years ago with vascular risk factors of diabetes, hypertension, hyperlipidemia, peripheral arterial disease and heart disease. Chronic insomnia been followed by Dr. Brett Mccall.   11/12/2012 He is seen for followup today after last visit on 05/22/12. He has multiple complaints today. He is not having any intercurrent stroke or TIA symptoms. He remains on aspirin and Plavix due to his cardiac stents and is tolerated well with only minor easy bleeding. He states his blood pressure and cholesterol have been under good control this as has been his sugars. He complains of increasing neck pain as well as back pain and intermittent left leg weakness. He states after his walk for about 5 minutes his left leg feels saline at times the knee gives out. He feels all right after he has rested for a few minutes. He denies radicular pain but does complain of some dental pain nose in his back as well as the neck. He did have MRI scan of the neck and lumbar spine done in 2011 which had shown mild degenerative disc disease. He denies any recent fall, injury to his back or neck. He also complains of memory difficulties for the last month or 2 with trouble remembering recent information and tasks. He does have history of depression and has been started on Lexapro 10 mg by Dr. Virgina Mccall his primary physician. He is tolerating well without side effects but feels depression is not yet improved. He complains of cramps in his legs as well as restless legs at night. He sees Dr. Maureen Mccall for his chronic sleep complaints.   UPDATE 02/15/13 (LL): Patient returns to office for MRI and NCV/EMG results. MRIs were unchanged from previous studies and NCV/EMG were normal. He reports that sometimes he has low back pain and uses Lidoderm patch which  provides some relief. He reports he has had TIAs, 2 in the last month, with left side facial droop and left arm weakness which resolved with 2 hours. He had a recent follow up with Dr. Oneida Mccall and his AAA graft is stable. He reports better mood since the Lexapro dose was increased. He reports he is on three different inhalers now and feels like he cannot get enough air in at times. Dr. Virgina Mccall is managing his respiratory care. He remains on aspirin and Plavix due to his cardiac stents and is tolerated well with only minor easy bleeding. He states his blood pressure and cholesterol have been under good control this as has been his sugars. He reports he tries to stay active, even though he cannot do very much activity without having to rest.  UPDATE 05/17/13 (LL):  Christian Mccall returns for TIA follow up.  He continues to have TIA symptoms occasionally, with left side facial droop and left arm weakness that resolves within an hour.   Since his last visit, he states he was hospitalized around Thanksgiving for chest pain.  His workup was negative for acute MI.  He remains on aspirin and Plavix due to his cardiac stents and is tolerated well with only minor easy bleeding. He states his BP is well controlled, is 131/71 in office today.  He continues to feel very fatigued, he states he falls asleep easily during the day.  Update 11/16/2013 : He returns for followup after last visit 6 months ago. He states  had 2 more TIAs 2 months and   2 weeks ago. They were similar and he noticed drooping of his left face as well as mild left-sided weakness lasting from 2-10 minutes. He did not seek medical help for these. He has had several of these in the past. He continues to be on aspirin and Plavix  because of cardiac disease. He states his diabetes is not well controlled and he an A1c was 8.0  Just 2 weeks ago. He is participating in the [place] cholesterol study ( evaceterapib versus placebo ) through [employer]. She has been  complaining of memory difficulties for the last 6 months which seem to be problematic. He still remains independent in activities of daily living but is disturbed by inability to remember recent information on making memories. He does complain of excessive daytime sleepiness as well as restless sleep. He does snore. He has been evaluated by Dr. Brett Mccall in the past for sleep apnea but the  PSG test was negative. He has not had any evaluation for treatable causes of memory loss. Update 11/24/2013 ; he returns for followup of her his last visit with me 8 days ago. He continues to have short-term memory difficulties which are unchanged. He had lab work and EEG done since her last visit. His RPR was positive and subsequently confirmatory  FTA ABS test was also positive. Rest of the lab work was negative. EEG showed no seizure activity. Patient is here today to do a spinal tap to look for neurosyphilis. REVIEW OF SYSTEMS: Full 14 system review of systems performed and notable only for:  Hearing loss, memory loss and all the systems negative ALLERGIES: No Known Allergies  HOME MEDICATIONS: Outpatient Prescriptions Prior to Visit  Medication Sig Dispense Refill  . ADVAIR DISKUS 100-50 MCG/DOSE AEPB Inhale 1 puff into the lungs every 12 (twelve) hours.       [name] aspirin 325 MG tablet Take 325 mg by mouth daily.        . cholecalciferol (VITAMIN D) 1000 UNITS tablet Take 1,000 Units by mouth daily.      . clonazePAM (KLONOPIN) 1 MG tablet Take 1 mg by mouth at bedtime.      . clopidogrel (PLAVIX) 75 MG tablet Take 75 mg by mouth daily.        . cyanocobalamin (,VITAMIN B-12,) 1000 MCG/ML injection Inject 1,000 mcg into the muscle every 3 (three) months.      . escitalopram (LEXAPRO) 20 MG tablet Take 20 mg by mouth at bedtime.      [name] ezetimibe-simvastatin (VYTORIN) 10-40 MG per tablet Take 1 tablet by mouth at bedtime.        . fenofibrate 160 MG tablet Take 160 mg by mouth daily.       . furosemide (LASIX) 40  MG tablet Take 40 mg by mouth every other day.       [name] glimepiride (AMARYL) 4 MG tablet Take 4 mg by mouth at bedtime.       . lansoprazole (PREVACID) 15 MG capsule Take 30 mg by mouth daily.       [name] losartan (COZAAR) 50 MG tablet Take 50 mg by mouth daily.        . metFORMIN (GLUCOPHAGE) 1000 MG tablet Take 500-1,000 mg by mouth 2 (two) times daily with a meal. Take one tab in am and 1 tab at night      . metoprolol (LOPRESSOR) 50 MG tablet Take 50 mg by mouth 2 (two) times daily.       [name]  NON FORMULARY Take 1 capsule by mouth daily. [employer] Medication      . pramipexole (MIRAPEX) 0.25 MG tablet Take 0.25 mg by mouth at bedtime.       . pregabalin (LYRICA) 100 MG capsule Take 100-200 mg by mouth 2 (two) times daily. One Tab am and two Tabs pm      . ranolazine (RANEXA) 1000 MG SR tablet Take 1,000 mg by mouth 2 (two) times daily.        [name] terazosin (HYTRIN) 1 MG capsule Take 1 mg by mouth at bedtime.        [name] tiotropium (SPIRIVA) 18 MCG inhalation capsule Place 18 mcg into inhaler and inhale daily.       . VENTOLIN HFA 108 (90 BASE) MCG/ACT inhaler Inhale 1 puff into the lungs 2 (two) times daily as needed. For shortness of breath/difficulty breathing      . nitroGLYCERIN (NITRODUR - DOSED IN MG/24 HR) 0.6 mg/hr Place 1 patch onto the skin daily.         No facility-administered medications prior to visit.    PAST MEDICAL HISTORY: Past Medical History  Diagnosis Date  . Hypertension   . Hyperlipidemia   . Myocardial infarction 2002  . COPD (chronic obstructive pulmonary disease)   . AAA (abdominal aortic aneurysm)   . Neuropathy   . CAD (coronary artery disease)   . Diabetes mellitus 2009  . Arthritis   . Joint pain   . Leg pain     with walking and pain in feet while lying flat  . Chest pain   . Shortness of breath 12/06/10    while lying flat and with exertion  . Reflux   . Stroke June 2014    Mini  pt. had several small strokes    PAST SURGICAL HISTORY: Past  Surgical History  Procedure Laterality Date  . Coronary artery bypass graft  2002  . Abdominal aortic aneurysm repair  01-16-11    EVAR  . Abdominal aortic aneurysm repair      FAMILY HISTORY: Family History  Problem Relation Age of Onset  . Aneurysm Mother     AAA  . Heart disease Mother     Heart Disease before age 75  . Other Mother     Carotid artery stenosis  . Hyperlipidemia Mother   . Hypertension Mother   . Aneurysm Maternal Aunt     AAA  . Other Maternal Aunt     AAA  . Aneurysm Maternal Uncle     AAA  . Other Maternal Uncle     AAA  . Lupus Sister   . Heart disease Brother     Heart Disease before age 35  . Hypertension Brother   . Heart disease Brother   . Heart attack Brother   . Heart attack Brother     SOCIAL HISTORY: History   Social History  . Marital Status: Married    Spouse Name: reba    Number of Children: 2  . Years of Education: 9   Occupational History  . disability    Social History Main Topics  . Smoking status: Former Smoker -- 2.00 packs/day for 30 years    Types: Cigarettes    Quit date: 06/10/2000  . Smokeless tobacco: Never Used  . Alcohol Use: Yes     Comment: social  . Drug Use: No  . Sexual Activity: No   Other Topics Concern  . Not on file  Social History Narrative   Patient lives at home with his wife.      PHYSICAL EXAM  Filed Vitals:   11/24/13 1350  BP: 113/67  Pulse: 75  Height: 5\' 11"  (1.803 m)  Weight: 222 lb (100.699 kg)   Body mass index is 30.98 kg/(m^2).  Generalized: Well developed, in no acute distress, obese Caucasian male  Head: normocephalic and atraumatic. Oropharynx benign  Neck: Supple, no carotid bruits  Cardiac: Regular rate rhythm, no murmur  Musculoskeletal: No deformity   Neurologic Exam  Mental Status: Awake and fully alert. Oriented to place and time. Recent  memory  poorAttention span, concentration and fund of knowledge appropriate. Mood and affect appropriate.  Cranial  Nerves: Pupils equal, briskly reactive to light. Extraocular movements full without nystagmus. Visual fields full to confrontation. Hearing intact. Facial sensation intact. Face, tongue, palate moves normally and symmetrically.  Motor: Normal bulk and tone. Normal strength in all tested extremity muscles. Mild weakness of left foot great toe extensor and ankle dorsiflexors. Unable to walk on his heels without support  Sensory.: Mildly diminished touch, pinprick and vibration sensation over the toes left greater than right feet. Positive Romberg sign Coordination: Rapid alternating movements normal in all extremities. Finger-to-nose and heel-to-shin performed accurately bilaterally.  Gait and Station: Arises from chair without difficulty. Stance is normal. Gait demonstrates normal stride length and balance. Unable to heel, toe walk without difficulty. Romberg positive.  Reflexes: 1+ and symmetric except both ankle jerks are depressed. Toes downgoing.   DIAGNOSTIC DATA (LABS, IMAGING, TESTING) - I reviewed patient records, labs, notes, testing and imaging myself where available.  Lab Results  Component Value Date   WBC 6.7 08/12/2013   HGB 14.0 08/12/2013   HCT 41.8 08/12/2013   MCV 88.8 08/12/2013   PLT 82* 08/12/2013      Component Value Date/Time   NA 137 05/05/2013 1347   NA 136 05/02/2013 0410   K 4.6 05/05/2013 1347   K 3.5 05/02/2013 0410   CL 100 05/02/2013 0410   CO2 22 05/05/2013 1347   CO2 26 05/02/2013 0410   GLUCOSE 287* 05/05/2013 1347   GLUCOSE 178* 05/02/2013 0410   BUN 12.9 05/05/2013 1347   BUN 15 05/02/2013 0410   CREATININE 1.2 05/05/2013 1347   CREATININE 0.99 05/02/2013 0410   CREATININE 1.01 02/01/2013 1210   CALCIUM 10.2 05/05/2013 1347   CALCIUM 9.0 05/02/2013 0410   PROT 7.3 05/05/2013 1347   PROT 6.2 01/24/2011 0759   ALBUMIN 3.7 05/05/2013 1347   ALBUMIN 3.2* 01/24/2011 0759   AST 33 05/05/2013 1347   AST 21 01/24/2011 0759   ALT 32 05/05/2013 1347   ALT 17  01/24/2011 0759   ALKPHOS 69 05/05/2013 1347   ALKPHOS 39 01/24/2011 0759   BILITOT 0.68 05/05/2013 1347   BILITOT 0.5 01/24/2011 0759   GFRNONAA 90* 05/02/2013 0410   GFRAA >90 05/02/2013 0410   Lab Results  Component Value Date   CHOL 129 05/02/2013   HDL 69 05/02/2013   LDLCALC 14 05/02/2013   TRIG 230* 05/02/2013   CHOLHDL 1.9 05/02/2013   Lab Results  Component Value Date   HGBA1C 7.9* 05/01/2013   Lab Results  Component Value Date   VITAMINB12 335 11/16/2013   Lab Results  Component Value Date   TSH 2.080 11/16/2013   Lab Results  Component Value Date   ESRSEDRATE 30 11/16/2013   11/26/12 EMG/NCV Nerve conduction studies done on both lower extremities and on the right  upper extremity were unremarkable. There is no evidence of a neuropathy. EMG evaluation of the left lower extremity was unremarkable. No evidence of a left lumbosacral radiculopathy is seen.  11/12/12 MRI cervical spine (without)  Mildly abnormal MRI cervical spine (without) demonstrating disc bulging at C4-5, C5-6 and C6 7. No spinal stenosis or foraminal narrowing. No change from MRI on 03/12/12. 11/12/12 MRI lumbar spine (without)  Abnormal MRI lumbar spine (without) demonstrating: Minimal degenerative spondylosis L1-2 down to L5-S1. No change from last MRI on 03/05/10. Enlarged aorta (5.1cm) status post endograft repair (per history), which is a new finding from last MRI on 03/05/10.  ASSESSMENT AND PLAN 58 year old male with recurrent right brain TIA with vascular risk factors of diabetes, hypertension, hyperlipidemia, long smoking history, peripheral arterial disease and heart disease. Chronic insomnia been followed by Dr. Maureen Mccall. Recurrent neck and back pain from degenerative spine disease,  New  memory difficulties abnormal blood work for syphilis raising question of neurosyphilis which needs to be addressed by doing a spinal tap and checking CSF VDRL and cell count.  PLAN:  I had a long discussion with the  patient and wife regarding   The implications of his positive blood test for syphilis, need for treatment with antibiotics and doing a spinal tap to see if he has neurosyphilis as the antibiotic duration would change. I also spoke to patient's primary physician Dr. Virgina Mccall who agreed to start him on antibiotics pending the results of the spinal tap.  Procedure Note ; Spinal Tap After taking  written informed consent from the patient and explaining the risk and benefits specifically risk for bleeding, discomfort, spinal headache spinal tap was attempted under local spinal anesthesia at the bedside. Patient had been advised to discontinue aspirin and Plavix 2 days prior. Initial attempt was with the patient sitting up in the L5-S1 space. 3 cc of 1% lidocaine was infiltrated locally after cleaning the area of the lower back in a sterile fashion with iodine. A 21-gauge spinal was introduced but spinal fluid could not be obtained. There was minor amount of bleeding which stopped with local pressure. Spinal tap was reattempted with the patient in the right e disposition in the L4-L5 space but again spinal fluid could not be obtained. The patient was cooperative and in no obvious distress during the procedure. After several attempts it was mutually agreed discontinue the procedure and defer  it to be done under fluoroscopic guidance by the radiologist in the next few days. The spinal fluid would be sent for cell count as well as CSF VDRL  Antony Contras, MD  11/24/2013, 5:25 PM [facility] [address], [place],  [address] [phone]  Note: This document was prepared with digital dictation and possible smart phrase technology. Any transcriptional errors that result from this process are unintentional.

## 2013-11-25 ENCOUNTER — Other Ambulatory Visit: Payer: Self-pay | Admitting: Neurology

## 2013-11-25 DIAGNOSIS — A539 Syphilis, unspecified: Secondary | ICD-10-CM

## 2013-11-26 ENCOUNTER — Ambulatory Visit
Admission: RE | Admit: 2013-11-26 | Discharge: 2013-11-26 | Disposition: A | Discharge status: Home / Self Care | Payer: 59 | Source: Ambulatory Visit | Attending: Neurology | Admitting: Neurology

## 2013-11-26 ENCOUNTER — Telehealth: Payer: Self-pay | Admitting: Neurology

## 2013-11-26 VITALS — BP 127/53 | HR 74

## 2013-11-26 DIAGNOSIS — A539 Syphilis, unspecified: Secondary | ICD-10-CM

## 2013-11-26 LAB — CSF CELL COUNT WITH DIFFERENTIAL
RBC COUNT CSF: 0 uL
RBC Count, CSF: 0 uL
Tube #: 1
Tube #: 4
WBC, CSF: 0 cu mm (ref 0–5)
WBC, CSF: 0 uL (ref 0–5)

## 2013-11-26 LAB — GLUCOSE, CSF: Glucose, CSF: 103 mg/dL — ABNORMAL HIGH (ref 43–76)

## 2013-11-26 LAB — PROTEIN, CSF: Total Protein, CSF: 35 mg/dL (ref 15–45)

## 2013-11-26 NOTE — Discharge Instructions (Addendum)
Lumbar Puncture Discharge Instructions  1. Go home and rest quietly for the next 24 hours.  It is important to lie flat for the next 24 hours.  Get up only to go to the restroom.  You may lie in the bed or on a couch on your back, your stomach, your left side or your right side.  You may have one pillow under your head.  You may have pillows between your knees while you are on your side or under your knees while you are on your back.  2. DO NOT drive today.  Recline the seat as far back as it will go, while still wearing your seat belt, on the way home.  3. You may get up to go to the bathroom as needed.  You may sit up for 10 minutes to eat.  You may resume your normal diet and medications unless otherwise indicated.  Drink lots of extra fluids today and tomorrow.  4. The incidence of headache, nausea, or vomiting is about 5% (one in 20 patients).  If you develop a headache, lie flat and drink plenty of fluids until the headache goes away.  Caffeinated beverages may be helpful.  If you develop severe nausea and vomiting or a headache that does not go away with flat bed rest, call [phone].  5. You may resume normal activities after your 24 hours of bed rest is over; however, do not exert yourself strongly or do any heavy lifting tomorrow.  6. Call your physician for a follow-up appointment.   7. If you have any questions or if complications develop after you arrive home, please call [phone].  Discharge instructions have been explained to the patient.  The patient, or the person responsible for the patient, fully understands these instructions.  May resume Plavix today.

## 2013-11-26 NOTE — Telephone Encounter (Signed)
Jessica with [employer], calling to inform [facility] blood work results will not be ready for at least 72 hours.  If wanting results faxed call and let her know.. [phone].  thanks

## 2013-11-27 LAB — VDRL, CSF: SYPHILIS VDRL QUANT CSF: NONREACTIVE

## 2013-11-29 NOTE — Telephone Encounter (Signed)
CSF results back.

## 2013-11-29 NOTE — Telephone Encounter (Signed)
I called patient. The patient had a positive RPR in low titer at 1-16. The spinal fluid studies are unremarkable. No evidence of a central nervous system infection. The RPR may be a false positive or a previously treated infection. I discussed this with the patient.

## 2013-11-30 ENCOUNTER — Encounter: Payer: Self-pay | Admitting: Internal Medicine

## 2013-11-30 ENCOUNTER — Ambulatory Visit (INDEPENDENT_AMBULATORY_CARE_PROVIDER_SITE_OTHER): Payer: 59 | Admitting: Internal Medicine

## 2013-11-30 VITALS — BP 124/79 | HR 73 | Temp 97.9°F | Ht 71.0 in | Wt 211.5 lb

## 2013-11-30 DIAGNOSIS — A528 Late syphilis, latent: Secondary | ICD-10-CM

## 2013-11-30 DIAGNOSIS — N476 Balanoposthitis: Secondary | ICD-10-CM

## 2013-11-30 DIAGNOSIS — A6000 Herpesviral infection of urogenital system, unspecified: Secondary | ICD-10-CM

## 2013-11-30 DIAGNOSIS — N481 Balanitis: Secondary | ICD-10-CM

## 2013-11-30 HISTORY — DX: Balanitis: N48.1

## 2013-11-30 MED ORDER — PENICILLIN G BENZATHINE 1200000 UNIT/2ML IM SUSP
1.2000 10*6.[IU] | Freq: Once | INTRAMUSCULAR | Status: AC
  Administered 2013-11-30: 1.2 10*6.[IU] via INTRAMUSCULAR

## 2013-11-30 NOTE — Progress Notes (Addendum)
Patient ID: Christian Mccall, male   DOB: 02/20/1956, 58 y.o.   MRN: [id number]         [facility] for Infectious Disease  Reason for Consult: Syphilis Referring Physician: Dr. Shon Baton  Patient Active Problem List   Diagnosis Date Noted  . Syphilis, late latent 11/24/2013    Priority: High  . Genital herpes 12/03/2013    Priority: Medium  . Balanitis 11/30/2013    Priority: Medium  . Mild cognitive impairment 11/16/2013  . Chest pain 05/03/2013  . Other and unspecified hyperlipidemia 05/03/2013  . Essential hypertension, benign 05/03/2013  . Weakness of limb-Left >than right 02/04/2013  . Shortness of breath 02/04/2013  . Pain in limb-Bilat leg 02/04/2013  . Aftercare following surgery of the circulatory system, [place] 02/04/2013  . Hemiplegia, unspecified, affecting nondominant side 11/12/2012  . Restless legs syndrome (RLS) 11/12/2012  . Lumbar back pain 11/12/2012  . Transient left leg weakness 11/12/2012  . Back pain 11/12/2012  . Cervicalgia 11/12/2012  . Memory loss 11/12/2012  . Abdominal aneurysm without mention of rupture 02/06/2012  . Abdominal aortic aneurysm 12/06/2010  . AAA (abdominal aortic aneurysm) 12/06/2010   Patient Active Problem List   Diagnosis Date Noted  . Syphilis, late latent 11/24/2013    Priority: High  . Balanitis 11/30/2013    Priority: Medium  . Mild cognitive impairment 11/16/2013  . Chest pain 05/03/2013  . Other and unspecified hyperlipidemia 05/03/2013  . Essential hypertension, benign 05/03/2013  . Weakness of limb-Left >than right 02/04/2013  . Shortness of breath 02/04/2013  . Pain in limb-Bilat leg 02/04/2013  . Aftercare following surgery of the circulatory system, [place] 02/04/2013  . Hemiplegia, unspecified, affecting nondominant side 11/12/2012  . Restless legs syndrome (RLS) 11/12/2012  . Lumbar back pain 11/12/2012  . Transient left leg weakness 11/12/2012  . Back pain 11/12/2012  . Cervicalgia 11/12/2012  . Memory  loss 11/12/2012  . Abdominal aneurysm without mention of rupture 02/06/2012  . Abdominal aortic aneurysm 12/06/2010  . AAA (abdominal aortic aneurysm) 12/06/2010    Patient's Medications  New Prescriptions   No medications on file  Previous Medications   ADVAIR DISKUS 100-50 MCG/DOSE AEPB    Inhale 1 puff into the lungs every 12 (twelve) hours.    ASPIRIN 325 MG TABLET    Take 325 mg by mouth daily.     CHOLECALCIFEROL (VITAMIN D) 1000 UNITS TABLET    Take 1,000 Units by mouth daily.   CLONAZEPAM (KLONOPIN) 1 MG TABLET    Take 1 mg by mouth at bedtime.   CLOPIDOGREL (PLAVIX) 75 MG TABLET    Take 75 mg by mouth daily.     CYANOCOBALAMIN (,VITAMIN B-12,) 1000 MCG/ML INJECTION    Inject 1,000 mcg into the muscle every 3 (three) months.   ESCITALOPRAM (LEXAPRO) 20 MG TABLET    Take 20 mg by mouth at bedtime.   EZETIMIBE-SIMVASTATIN (VYTORIN) 10-40 MG PER TABLET    Take 1 tablet by mouth at bedtime.     FENOFIBRATE 160 MG TABLET    Take 160 mg by mouth daily.    FUROSEMIDE (LASIX) 40 MG TABLET    Take 40 mg by mouth every other day.    GLIMEPIRIDE (AMARYL) 4 MG TABLET    Take 4 mg by mouth at bedtime.    LANSOPRAZOLE (PREVACID) 15 MG CAPSULE    Take 30 mg by mouth daily.    LOSARTAN (COZAAR) 50 MG TABLET    Take 50 mg by  mouth daily.     METFORMIN (GLUCOPHAGE) 1000 MG TABLET    Take 500-1,000 mg by mouth 2 (two) times daily with a meal. Take one tab in am and 1 tab at night   METOPROLOL (LOPRESSOR) 50 MG TABLET    Take 50 mg by mouth 2 (two) times daily.    NITROGLYCERIN (NITROSTAT) 0.4 MG SL TABLET    Place 0.4 mg under the tongue every 5 (five) minutes as needed for chest pain.   NON FORMULARY    Take 1 capsule by mouth daily. [employer] Medication   PRAMIPEXOLE (MIRAPEX) 0.25 MG TABLET    Take 0.25 mg by mouth at bedtime.    PREGABALIN (LYRICA) 100 MG CAPSULE    Take 100-200 mg by mouth 2 (two) times daily. One Tab am and two Tabs pm   RANOLAZINE (RANEXA) 1000 MG SR TABLET     Take 1,000 mg by mouth 2 (two) times daily.     TERAZOSIN (HYTRIN) 1 MG CAPSULE    Take 1 mg by mouth at bedtime.     TIOTROPIUM (SPIRIVA) 18 MCG INHALATION CAPSULE    Place 18 mcg into inhaler and inhale daily.    VENTOLIN HFA 108 (90 BASE) MCG/ACT INHALER    Inhale 1 puff into the lungs 2 (two) times daily as needed. For shortness of breath/difficulty breathing  Modified Medications   No medications on file  Discontinued Medications   No medications on file    Recommendations: 1. Start benzathine penicillin 2.4 million units IM x3 doses over 2 weeks 2. Continue topical antifungal cream for balanitis 3. Culture small penile ulcer for herpes simplex 4. Recommend repeat RPR every 3-6 months for the next 12-24 months   Assessment: He probably has late, latent syphilis. Is lumbar puncture does not show any evidence of neurosyphilis and I do not believe syphilis is contributing to his memory loss or stroke syndromes. I will treat him with a standard course of benzathine penicillin 2.4 million units IM weekly x3. He will need to have his RPR monitored every 3-6 months to document to climb to nonreactive or low stable titer. This could take several years.  His current penile lesion does look like candida balanitis. It does not have the appearance of a syphilitic chancre and the duration of the lesion goes against this. I will culture her the central ulcer for herpes simplex but the fact that it has improved with antifungal therapy makes herpes simplex relatively unlikely. I suggested he continue topical antifungal cream at this time.  HPI: Christian Mccall is a 58 y.o. male with a long history of memory loss, recurrent TIAs strokes. He has a strong family history of cardiovascular disease. During a recent evaluation for his memory loss he had an RPR done that was positive at titer of 16. In order to rule out neurosyphilis he underwent lumbar puncture on June 19. Opening pressure was 17 cm of water. No  cells were seen. His protein was normal at 35 his glucose was slightly elevated at 103. CSF VDRL was negative. Repeat serum RPR was positive. HIV antibody was negative.  He states that he has about 25 lifetime sexual contacts, all male. He is in with his wife, Christian Mccall, today. They have been married for 39 years. His last sexual contact with any one other than his wife was about 15 years ago. She confirms that they have not been sexually active in at least 1-1/2 years due to his erectile dysfunction  and dyspnea on exertion. He has no known history of other sexually transmitted diseases. She has not been tested for syphilis he has no history of sexually transmitted diseases.   About a month and a half ago he noticed some sore on his glans penis. There is diffuse redness and it was mildly tender. It sounds like there was an area of central, superficial ulceration. He was recently seen by his primary care physician, Dr. Virgina Jock, and noted that it looked like candida balanitis. He was treated with oral fluconazole and topical antifungals and he and his wife state that the area has improved.  Review of Systems: Constitutional: positive for fatigue and malaise, negative for anorexia, chills, fevers, sweats and weight loss Eyes: negative Ears, nose, mouth, throat, and face: negative Respiratory: positive for dyspnea on exertion, negative for hemoptysis and sputum Cardiovascular: positive for chest pressure/discomfort and dyspnea, negative for irregular heart beat and near-syncope Gastrointestinal: negative Genitourinary:positive for recent redness and soreness of the head of his penis, negative for dysuria, frequency, hesitancy, nocturia and urinary incontinence Neurological: positive for memory problems, negative for coordination problems, headaches, seizures and speech problems    Past Medical History  Diagnosis Date  . Hypertension   . Hyperlipidemia   . Myocardial infarction 2002  . COPD (chronic  obstructive pulmonary disease)   . AAA (abdominal aortic aneurysm)   . Neuropathy   . CAD (coronary artery disease)   . Diabetes mellitus 2009  . Arthritis   . Joint pain   . Leg pain     with walking and pain in feet while lying flat  . Chest pain   . Shortness of breath 12/06/10    while lying flat and with exertion  . Reflux   . Stroke June 2014    Mini  pt. had several small strokes    History  Substance Use Topics  . Smoking status: Former Smoker -- 2.00 packs/day for 30 years    Types: Cigarettes    Quit date: 06/10/2000  . Smokeless tobacco: Never Used  . Alcohol Use: Yes     Comment: social    Family History  Problem Relation Age of Onset  . Aneurysm Mother     AAA  . Heart disease Mother     Heart Disease before age 26  . Other Mother     Carotid artery stenosis  . Hyperlipidemia Mother   . Hypertension Mother   . Aneurysm Maternal Aunt     AAA  . Other Maternal Aunt     AAA  . Aneurysm Maternal Uncle     AAA  . Other Maternal Uncle     AAA  . Lupus Sister   . Heart disease Brother     Heart Disease before age 15  . Hypertension Brother   . Heart disease Brother   . Heart attack Brother   . Heart attack Brother    No Known Allergies  OBJECTIVE: Blood pressure 124/79, pulse 73, temperature 97.9 F (36.6 C), temperature source Oral, height 5\' 11"  (1.803 m), weight 211 lb 8 oz (95.936 kg). General: He is in no distress Skin: No rash Lymph nodes: No palpable adenopathy Oral: No oropharyngeal lesions Eyes: Normal external exam Lungs: Clear Cor: Distant regular S1 and S2 with no murmurs. Healed sternotomy incision Abdomen: Obese, soft and nontender GU: Moist erythematous lesions over the glans and distal shaft of the penis. There is a very small area of central ulceration.  Michel Bickers, MD [facility]  for Infectious Disease [place] [facility] [phone] pager   [phone] cell 11/30/2013, 3:30 PM  Addendum: I spoke with Mr. Marxen  today. The small ulcerated area just below his glans is continuing to improve. His cultures confirmed that he does have genital herpes. Given the duration of his lesion and his recent improvement it is unlikely that antiviral treatment we'll offer much benefit now. Self directed therapy with Valtrex 500 mg twice daily for 5 days could be used for recurrences.  HSV culture 11/30/2013:  HERPES SIMPLEX VIRUS TYPE 2 DETECTED   Michel Bickers, MD [facility] for Infectious [place] [facility] [phone] pager   [phone] cell 12/03/2013, 12:21 PM

## 2013-12-02 LAB — HERPES SIMPLEX VIRUS CULTURE: ORGANISM ID, BACTERIA: DETECTED

## 2013-12-03 DIAGNOSIS — A6000 Herpesviral infection of urogenital system, unspecified: Secondary | ICD-10-CM

## 2013-12-03 HISTORY — DX: Herpesviral infection of urogenital system, unspecified: A60.00

## 2013-12-07 ENCOUNTER — Ambulatory Visit
Admission: RE | Admit: 2013-12-07 | Discharge: 2013-12-07 | Disposition: A | Discharge status: Home / Self Care | Payer: 59 | Source: Ambulatory Visit | Attending: Neurology | Admitting: Neurology

## 2013-12-07 ENCOUNTER — Ambulatory Visit (INDEPENDENT_AMBULATORY_CARE_PROVIDER_SITE_OTHER): Payer: 59 | Admitting: *Deleted

## 2013-12-07 DIAGNOSIS — G3184 Mild cognitive impairment, so stated: Secondary | ICD-10-CM

## 2013-12-07 DIAGNOSIS — A539 Syphilis, unspecified: Secondary | ICD-10-CM

## 2013-12-07 DIAGNOSIS — R413 Other amnesia: Secondary | ICD-10-CM

## 2013-12-07 MED ORDER — PENICILLIN G BENZATHINE 1200000 UNIT/2ML IM SUSP
1.2000 10*6.[IU] | Freq: Once | INTRAMUSCULAR | Status: AC
  Administered 2013-12-07: 1.2 10*6.[IU] via INTRAMUSCULAR

## 2013-12-14 ENCOUNTER — Ambulatory Visit (INDEPENDENT_AMBULATORY_CARE_PROVIDER_SITE_OTHER): Payer: 59 | Admitting: *Deleted

## 2013-12-14 DIAGNOSIS — A530 Latent syphilis, unspecified as early or late: Secondary | ICD-10-CM

## 2013-12-14 MED ORDER — PENICILLIN G BENZATHINE 1200000 UNIT/2ML IM SUSP
1.2000 10*6.[IU] | Freq: Once | INTRAMUSCULAR | Status: AC
  Administered 2013-12-14: 1.2 10*6.[IU] via INTRAMUSCULAR

## 2013-12-14 NOTE — Progress Notes (Signed)
Pt tolerated well.  Knows to follow up with his PCP for further tests. Landis Gandy, RN

## 2014-02-09 ENCOUNTER — Encounter: Payer: Self-pay | Admitting: Family

## 2014-02-10 ENCOUNTER — Ambulatory Visit (INDEPENDENT_AMBULATORY_CARE_PROVIDER_SITE_OTHER): Payer: 59 | Admitting: Family

## 2014-02-10 ENCOUNTER — Other Ambulatory Visit: Payer: Self-pay | Admitting: *Deleted

## 2014-02-10 ENCOUNTER — Ambulatory Visit (HOSPITAL_COMMUNITY)
Admission: RE | Admit: 2014-02-10 | Discharge: 2014-02-10 | Disposition: A | Discharge status: Home / Self Care | Payer: 59 | Source: Ambulatory Visit | Attending: Vascular Surgery | Admitting: Vascular Surgery

## 2014-02-10 ENCOUNTER — Other Ambulatory Visit: Payer: Self-pay | Admitting: Vascular Surgery

## 2014-02-10 ENCOUNTER — Ambulatory Visit (INDEPENDENT_AMBULATORY_CARE_PROVIDER_SITE_OTHER)
Admission: RE | Admit: 2014-02-10 | Discharge: 2014-02-10 | Disposition: A | Discharge status: Home / Self Care | Payer: 59 | Source: Ambulatory Visit | Attending: Family | Admitting: Family

## 2014-02-10 ENCOUNTER — Encounter: Payer: Self-pay | Admitting: Family

## 2014-02-10 VITALS — BP 128/75 | HR 58 | Resp 14 | Ht 71.0 in | Wt 227.0 lb

## 2014-02-10 DIAGNOSIS — I714 Abdominal aortic aneurysm, without rupture, unspecified: Secondary | ICD-10-CM

## 2014-02-10 DIAGNOSIS — R42 Dizziness and giddiness: Secondary | ICD-10-CM | POA: Insufficient documentation

## 2014-02-10 DIAGNOSIS — I6529 Occlusion and stenosis of unspecified carotid artery: Secondary | ICD-10-CM

## 2014-02-10 DIAGNOSIS — R52 Pain, unspecified: Secondary | ICD-10-CM | POA: Insufficient documentation

## 2014-02-10 DIAGNOSIS — I658 Occlusion and stenosis of other precerebral arteries: Secondary | ICD-10-CM | POA: Insufficient documentation

## 2014-02-10 DIAGNOSIS — Z48812 Encounter for surgical aftercare following surgery on the circulatory system: Secondary | ICD-10-CM

## 2014-02-10 DIAGNOSIS — I10 Essential (primary) hypertension: Secondary | ICD-10-CM | POA: Insufficient documentation

## 2014-02-10 DIAGNOSIS — Z789 Other specified health status: Secondary | ICD-10-CM

## 2014-02-10 NOTE — Progress Notes (Signed)
[facility] [place]  Established EVAR  History of Present Illness  Christian Mccall is a 58 y.o. (1956-02-17) male patient of Dr. Oneida Alar who presents for follow-up evaluation of AAA. He underwent Gore Excluder aneurysm stent graft repair in August 2012. The patient denies new abdominal or back pain. He has had episodes recently where pressure on his left abdomen caused him pain near his xiphoid region. This was being worked up by Dr. Virgina Jock.  It was improving. The patient's atherosclerotic risk factors remain hypertension, hyperlipidemia, coronary disease, COPD, diabetes. These are all currently stable and followed by his primary care physician. He has also noted recently that his arm blood pressure is different from one side to the other. This is asymptomatic with no dizziness or exertional fatigue. Advised that he should always have the blood pressure checked in a higher of the two. Pt points to a ventral hernia that feels sore when it protrudes, he also complains of mid low abdominal pain when the ventral hernia protrudes. Pt states he did not have this ventral hernia until he had the AAA EVAR, but he is not sure if it was there before the AAA EVAR. Excessive gas seen on abdominal Duplex, pt states his abdomen stays distended, denies constipation issues.  He states that he has known lumbar and c-spine issues. He has been hurting in his mid back area for a few months, is intermittent. Pt states he was told he has an enlarged spleen and fatty liver. Two days ago he had left rib area severe pain that resolved. Pain in calves with walking, but walking mostly limited by dyspnea, states he has COPD. He sees Dr. Leonie Man for 10 TIA's in the last couple of years, pt states Dr. Leonie Man will schedule a neck US, not done yet per pt. February, 2013 carotid Duplex indicated <40% bilateral ICA stenosis.  02/04/13 CTA abdomen/pelvis reviewed that same day by Dr. Oneida Alar: aneurysm diameter was 5.5  cm. There was no evidence of endoleak. Previously seen type II endoleak was not visualized that day. The top portion of the stent graft was adjacent to the renal arteries and there was no evidence of migration. No obvious cause for his left sided chest pain at that time.  Pt Diabetic: Yes, states in good control Pt smoker: former smoker, quit at age 62, when he had his CABG   Past Medical History  Diagnosis Date  . Hypertension   . Hyperlipidemia   . Myocardial infarction 2002  . COPD (chronic obstructive pulmonary disease)   . AAA (abdominal aortic aneurysm)   . Neuropathy   . CAD (coronary artery disease)   . Diabetes mellitus 2009  . Arthritis   . Joint pain   . Leg pain     with walking and pain in feet while lying flat  . Chest pain   . Shortness of breath 12/06/10    while lying flat and with exertion  . Reflux   . Stroke June 2014    Mini  pt. had several small strokes  . Stroke Sept. 2015    mini strokes=  at least 10 in last 2 mos.   Past Surgical History  Procedure Laterality Date  . Coronary artery bypass graft  2002  . Abdominal aortic aneurysm repair  01-16-11    EVAR  . Abdominal aortic aneurysm repair     Social History History  Substance Use Topics  . Smoking status: Former Smoker -- 2.00 packs/day for 30 years  Types: Cigarettes    Quit date: 06/10/2000  . Smokeless tobacco: Never Used  . Alcohol Use: Yes     Comment: social   Family History Family History  Problem Relation Age of Onset  . Aneurysm Mother     AAA  . Heart disease Mother     Heart Disease before age 6  . Other Mother     Carotid artery stenosis  . Hyperlipidemia Mother   . Hypertension Mother   . Aneurysm Maternal Aunt     AAA  . Other Maternal Aunt     AAA  . Aneurysm Maternal Uncle     AAA  . Other Maternal Uncle     AAA  . Lupus Sister   . Cancer Sister     Ovarian  . Heart disease Sister   . Heart disease Brother     Heart Disease before age 67  . Hypertension  Brother   . Heart attack Brother   . Heart disease Brother     Before age 36  . Heart attack Brother   . Heart attack Brother   . Hyperlipidemia Father   . Hypertension Father    Current Outpatient Prescriptions on File Prior to Visit  Medication Sig Dispense Refill  . ADVAIR DISKUS 100-50 MCG/DOSE AEPB Inhale 1 puff into the lungs every 12 (twelve) hours.       [name] aspirin 325 MG tablet Take 325 mg by mouth daily.        . cholecalciferol (VITAMIN D) 1000 UNITS tablet Take 1,000 Units by mouth daily.      . clonazePAM (KLONOPIN) 1 MG tablet Take 1 mg by mouth at bedtime.      . clopidogrel (PLAVIX) 75 MG tablet Take 75 mg by mouth daily.        . cyanocobalamin (,VITAMIN B-12,) 1000 MCG/ML injection Inject 1,000 mcg into the muscle. Every 2 months      . escitalopram (LEXAPRO) 20 MG tablet Take 20 mg by mouth at bedtime.      [name] ezetimibe-simvastatin (VYTORIN) 10-40 MG per tablet Take 1 tablet by mouth at bedtime.        . fenofibrate 160 MG tablet Take 160 mg by mouth daily.       . furosemide (LASIX) 40 MG tablet Take 40 mg by mouth every other day.       [name] glimepiride (AMARYL) 4 MG tablet Take 4 mg by mouth at bedtime.       . lansoprazole (PREVACID) 15 MG capsule Take 30 mg by mouth daily.       [name] losartan (COZAAR) 50 MG tablet Take 50 mg by mouth daily.        . metFORMIN (GLUCOPHAGE) 1000 MG tablet Take 500-1,000 mg by mouth 2 (two) times daily with a meal. Take one tab in am and 1 tab at night      . metoprolol (LOPRESSOR) 50 MG tablet Take 50 mg by mouth 2 (two) times daily.       . nitroGLYCERIN (NITROSTAT) 0.4 MG SL tablet Place 0.4 mg under the tongue every 5 (five) minutes as needed for chest pain.      . NON FORMULARY Take 1 capsule by mouth daily. [employer] Medication      . pramipexole (MIRAPEX) 0.25 MG tablet Take 0.25 mg by mouth at bedtime.       . pregabalin (LYRICA) 100 MG capsule Take 100-200 mg by mouth 2 (two) times daily. One Tab  am and two Tabs pm       . ranolazine (RANEXA) 1000 MG SR tablet Take 1,000 mg by mouth 2 (two) times daily.        [name] terazosin (HYTRIN) 1 MG capsule Take 1 mg by mouth at bedtime.        [name] tiotropium (SPIRIVA) 18 MCG inhalation capsule Place 18 mcg into inhaler and inhale daily.       . VENTOLIN HFA 108 (90 BASE) MCG/ACT inhaler Inhale 1 puff into the lungs 2 (two) times daily as needed. For shortness of breath/difficulty breathing       No current facility-administered medications on file prior to visit.   No Known Allergies   ROS: See HPI for pertinent positives and negatives.  Physical Examination  Filed Vitals:   02/10/14 1103  BP: 128/75  Pulse: 58  Resp: 14  Height: 5\' 11"  (1.803 m)  Weight: 227 lb (102.967 kg)  SpO2: 96%   Body mass index is 31.67 kg/(m^2).  General: A&O x 3, WD, obese male.  Pulmonary: Sym exp, good air movt, CTAB, no rales, rhonchi, or wheezing.  Cardiac: RRR, Nl S1, S2, no Murmur appreciated  Vascular: Vessel Right Left  Radial 2+Palpable 2+Palpable  Carotid  without bruit  without bruit  Aorta Not palpable N/A  Femoral notPalpable notPalpable  Popliteal Not palpable 1+ palpable  PT 2+Palpable 2+Palpable  DP 2+Palpable 2+Palpable   Gastrointestinal: soft, NTND, -G/R, - HSM, - masses, - CVAT B.  Musculoskeletal: M/S 5/5 in right UE and LE, 4/5 in left LE and UE, Extremities without ischemic changes.  Neurologic: CN 2-12 intact except mild left facial droop, Motor exam as listed above. Speech contains moderate degree of word searching.    Non-Invasive Vascular Imaging:  EVAR Duplex (Date: 02/10/2014) ABDOMINAL AORTA DUPLEX EVALUATION - POST ENDOVASCULAR REPAIR    INDICATION: Evaluation of abdominal aneurysm repair.    PREVIOUS INTERVENTION(S): Gore Excluder aneurysm stent graft repair August 2012.     DUPLEX EXAM:      DIAMETER AP (cm) DIAMETER TRANSVERSE (cm) VELOCITIES (cm/sec)  Aorta 4.63    Right Common Iliac Not Visualized  Not Visualized    Left  Common Iliac Not visualized  Not visualized     Comparison Study       Date DIAMETER AP (cm) DIAMETER TRANSVERSE (cm)  02/04/2013   CT 5.5      ADDITIONAL FINDINGS: Technically difficult exam due to patient body habitus and excessive bowel gas.    IMPRESSION: Largest measurement noted today of 4.63 cm; based on limited visualization.    Compared to the previous exam:  No prior post-op Duplex exams performed at this facility for comparison.    CEREBROVASCULAR DUPLEX EVALUATION (02/10/2014)     INDICATION: Dizziness and giddiness; carotid artery stenosis     PREVIOUS INTERVENTION(S): N/A    DUPLEX EXAM:     RIGHT  LEFT  Peak Systolic Velocities (cm/s) End Diastolic Velocities (cm/s) Plaque LOCATION Peak Systolic Velocities (cm/s) End Diastolic Velocities (cm/s) Plaque  128 10  CCA PROXIMAL 164 15   150 18 HT CCA MID 133 20   115 17 HT CCA DISTAL 119 17 HT  120 10  ECA 142 18   99 24 HT ICA PROXIMAL 85 20 HT  65 21  ICA MID 80 22   90 24  ICA DISTAL 86 19     0.87 ICA / CCA Ratio (PSV) 0.71  Antegrade Vertebral Flow Antegrade  409 Brachial Systolic Pressure (  mmHg) 142  Triphasic Brachial Artery Waveforms Triphasic    Plaque Morphology:  HM = Homogeneous, HT = Heterogeneous, CP = Calcific Plaque, SP = Smooth Plaque, IP = Irregular Plaque     ADDITIONAL FINDINGS:     IMPRESSION: Right common carotid artery disease present of less than 50% mid segment. Bilateral internal carotid artery stenosis present in the less than 40% range.    Compared to the previous exam:  Unchanged since previous study on 08/01/2011.     Medical Decision Making  Christian Mccall is a 59 y.o. male who presents s/p EVAR (Date: August, 2012).   Today's AAA Duplex:  Technically difficult exam due to patient body habitus and excessive bowel gas. Largest measurement noted today of 4.63 cm; based on limited visualization, compared to 02/04/13 CTA abdomen/pelvis largest measurement was 5.5 cm with no  endo leak. Multiple TIA's with mild left facial droop, mild left hemiparesis, and moderate degree of word searching. Last carotid Duplex on file was 08/01/11. Today's carotid Duplex demonstrates right common carotid artery disease present of less than 50% mid segment. Bilateral internal carotid artery stenosis present in the less than 40% range. Unchanged since previous study on 08/01/2011. He is being followed by a neurologist, Dr. Leonie Man. Dr. Oneida Alar reviewed patient's carotid Duplex results from today.    I discussed with the patient the importance of surveillance of the endograft.  The next endograft duplex will be scheduled for 12 months.  The patient will follow up with [place] in 12 months with these studies.  I emphasized the importance of maximal medical management including strict control of blood pressure, blood glucose, and lipid levels, antiplatelet agents, obtaining regular exercise, and cessation of smoking.   The patient was given information about AAA including signs, symptoms, treatment, and how to minimize the risk of enlargement and rupture of aneurysms.    Thank you for allowing [place] to participate in this patient's care.  Clemon Chambers, RN, MSN, FNP-C [facility] [place] Office: [facility] Physician: Oneida Alar  02/10/2014, 11:12 AM

## 2014-02-10 NOTE — Patient Instructions (Addendum)
Abdominal Aortic Aneurysm An aneurysm is a weakened or damaged part of an artery wall that bulges from the normal force of blood pumping through the body. An abdominal aortic aneurysm is an aneurysm that occurs in the lower part of the aorta, the main artery of the body.  The major concern with an abdominal aortic aneurysm is that it can enlarge and burst (rupture) or blood can flow between the layers of the wall of the aorta through a tear (aorticdissection). Both of these conditions can cause bleeding inside the body and can be life threatening unless diagnosed and treated promptly. CAUSES  The exact cause of an abdominal aortic aneurysm is unknown. Some contributing factors are:   A hardening of the arteries caused by the buildup of fat and other substances in the lining of a blood vessel (arteriosclerosis).  Inflammation of the walls of an artery (arteritis).   Connective tissue diseases, such as Marfan syndrome.   Abdominal trauma.   An infection, such as syphilis or staphylococcus, in the wall of the aorta (infectious aortitis) caused by bacteria. RISK FACTORS  Risk factors that contribute to an abdominal aortic aneurysm may include:  Age older than 60 years.   High blood pressure (hypertension).  Male gender.  Ethnicity (white race).  Obesity.  Family history of aneurysm (first degree relatives only).  Tobacco use. PREVENTION  The following healthy lifestyle habits may help decrease your risk of abdominal aortic aneurysm:  Quitting smoking. Smoking can raise your blood pressure and cause arteriosclerosis.  Limiting or avoiding alcohol.  Keeping your blood pressure, blood sugar level, and cholesterol levels within normal limits.  Decreasing your salt intake. In somepeople, too much salt can raise blood pressure and increase your risk of abdominal aortic aneurysm.  Eating a diet low in saturated fats and cholesterol.  Increasing your fiber intake by including  whole grains, vegetables, and fruits in your diet. Eating these foods may help lower blood pressure.  Maintaining a healthy weight.  Staying physically active and exercising regularly. SYMPTOMS  The symptoms of abdominal aortic aneurysm may vary depending on the size and rate of growth of the aneurysm.Most grow slowly and do not have any symptoms. When symptoms do occur, they may include:  Pain (abdomen, side, lower back, or groin). The pain may vary in intensity. A sudden onset of severe pain may indicate that the aneurysm has ruptured.  Feeling full after eating only small amounts of food.  Nausea or vomiting or both.  Feeling a pulsating lump in the abdomen.  Feeling faint or passing out. DIAGNOSIS  Since most unruptured abdominal aortic aneurysms have no symptoms, they are often discovered during diagnostic exams for other conditions. An aneurysm may be found during the following procedures:  Ultrasonography (A one-time screening for abdominal aortic aneurysm by ultrasonography is also recommended for all men aged 65-75 years who have ever smoked).  X-ray exams.  A computed tomography (CT).  Magnetic resonance imaging (MRI).  Angiography or arteriography. TREATMENT  Treatment of an abdominal aortic aneurysm depends on the size of your aneurysm, your age, and risk factors for rupture. Medication to control blood pressure and pain may be used to manage aneurysms smaller than 6 cm. Regular monitoring for enlargement may be recommended by your caregiver if:  The aneurysm is 3-4 cm in size (an annual ultrasonography may be recommended).  The aneurysm is 4-4.5 cm in size (an ultrasonography every 6 months may be recommended).  The aneurysm is larger than 4.5 cm in   size (your caregiver may ask that you be examined by a vascular surgeon). If your aneurysm is larger than 6 cm, surgical repair may be recommended. There are two main methods for repair of an aneurysm:   Endovascular  repair (a minimally invasive surgery). This is done most often.  Open repair. This method is used if an endovascular repair is not possible. Document Released: 03/06/2005 Document Revised: 09/21/2012 Document Reviewed: 06/26/2012 [employer] Patient Information 2015 [employer]. This information is not intended to replace advice given to you by your health care provider. Make sure you discuss any questions you have with your health care provider.   Stroke Prevention Some medical conditions and behaviors are associated with an increased chance of having a stroke. You may prevent a stroke by making healthy choices and managing medical conditions. HOW CAN I REDUCE MY RISK OF HAVING A STROKE?   Stay physically active. Get at least 30 minutes of activity on most or all days.  Do not smoke. It may also be helpful to avoid exposure to secondhand smoke.  Limit alcohol use. Moderate alcohol use is considered to be:  No more than 2 drinks per day for men.  No more than 1 drink per day for nonpregnant women.  Eat healthy foods. This involves:  Eating 5 or more servings of fruits and vegetables a day.  Making dietary changes that address high blood pressure (hypertension), high cholesterol, diabetes, or obesity.  Manage your cholesterol levels.  Making food choices that are high in fiber and low in saturated fat, trans fat, and cholesterol may control cholesterol levels.  Take any prescribed medicines to control cholesterol as directed by your health care provider.  Manage your diabetes.  Controlling your carbohydrate and sugar intake is recommended to manage diabetes.  Take any prescribed medicines to control diabetes as directed by your health care provider.  Control your hypertension.  Making food choices that are low in salt (sodium), saturated fat, trans fat, and cholesterol is recommended to manage hypertension.  Take any prescribed medicines to control hypertension as directed  by your health care provider.  Maintain a healthy weight.  Reducing calorie intake and making food choices that are low in sodium, saturated fat, trans fat, and cholesterol are recommended to manage weight.  Stop drug abuse.  Avoid taking birth control pills.  Talk to your health care provider about the risks of taking birth control pills if you are over 35 years old, smoke, get migraines, or have ever had a blood clot.  Get evaluated for sleep disorders (sleep apnea).  Talk to your health care provider about getting a sleep evaluation if you snore a lot or have excessive sleepiness.  Take medicines only as directed by your health care provider.  For some people, aspirin or blood thinners (anticoagulants) are helpful in reducing the risk of forming abnormal blood clots that can lead to stroke. If you have the irregular heart rhythm of atrial fibrillation, you should be on a blood thinner unless there is a good reason you cannot take them.  Understand all your medicine instructions.  Make sure that other conditions (such as anemia or atherosclerosis) are addressed. SEEK IMMEDIATE MEDICAL CARE IF:   You have sudden weakness or numbness of the face, arm, or leg, especially on one side of the body.  Your face or eyelid droops to one side.  You have sudden confusion.  You have trouble speaking (aphasia) or understanding.  You have sudden trouble seeing in one or   both eyes.  You have sudden trouble walking.  You have dizziness.  You have a loss of balance or coordination.  You have a sudden, severe headache with no known cause.  You have new chest pain or an irregular heartbeat. Any of these symptoms may represent a serious problem that is an emergency. Do not wait to see if the symptoms will go away. Get medical help at once. Call your local emergency services (911 in [place] Do not drive yourself to the hospital. Document Released: 07/04/2004 Document Revised: 10/11/2013  Document Reviewed: 11/27/2012 [employer] Patient Information 2015 [employer]. This information is not intended to replace advice given to you by your health care provider. Make sure you discuss any questions you have with your health care provider.  

## 2014-02-11 NOTE — Addendum Note (Signed)
Addended by: Mena Goes on: 02/11/2014 05:01 PM   Modules accepted: Orders

## 2014-03-18 ENCOUNTER — Encounter: Payer: Self-pay | Admitting: Neurology

## 2014-03-18 ENCOUNTER — Ambulatory Visit (INDEPENDENT_AMBULATORY_CARE_PROVIDER_SITE_OTHER): Payer: 59 | Admitting: Neurology

## 2014-03-18 VITALS — BP 135/72 | HR 60 | Resp 18 | Ht 71.0 in | Wt 229.0 lb

## 2014-03-18 DIAGNOSIS — I69391 Dysphagia following cerebral infarction: Secondary | ICD-10-CM

## 2014-03-18 DIAGNOSIS — G4752 REM sleep behavior disorder: Secondary | ICD-10-CM

## 2014-03-18 DIAGNOSIS — J441 Chronic obstructive pulmonary disease with (acute) exacerbation: Secondary | ICD-10-CM

## 2014-03-18 DIAGNOSIS — I25709 Atherosclerosis of coronary artery bypass graft(s), unspecified, with unspecified angina pectoris: Secondary | ICD-10-CM

## 2014-03-18 MED ORDER — CLONAZEPAM 1 MG PO TABS: 1.0000 mg | ORAL_TABLET | Freq: Every day | ORAL | Status: DC

## 2014-03-18 NOTE — Patient Instructions (Signed)
Polysomnography (Sleep Studies) Polysomnography (PSG) is a series of tests used for detecting (diagnosing) obstructive sleep apnea and other sleep disorders. The tests measure how some parts of your body are working while you are sleeping. The tests are extensive and expensive. They are done in a sleep lab or hospital, and vary from center to center. Your caregiver may perform other more simple sleep studies and questionnaires before doing more complete and involved testing. Testing may not be covered by insurance. Some of these tests are:  An EEG (Electroencephalogram). This tests your brain waves and stages of sleep.  An EOG (Electrooculogram). This measures the movements of your eyes. It detects periods of REM (rapid eye movement) sleep, which is your dream sleep.  An EKG (Electrocardiogram). This measures your heart rhythm.  EMG (Electromyography). This is a measurement of how the muscles are working in your upper airway and your legs while sleeping.  An oximetry measurement. It measures how much oxygen (air) you are getting while sleeping.  Breathing efforts may be measured. The same test can be interpreted (understood) differently by different caregivers and centers that study sleep.  Studies may be given an apnea/hypopnea index (AHI). This is a number which is found by counting the times of no breathing or under breathing during the night, and relating those numbers to the amount of time spent in bed. When the AHI is greater than 15, the patient is likely to complain of daytime sleepiness. When the AHI is greater than 30, the patient is at increased risk for heart problems and must be followed more closely. Following the AHI also allows you to know how treatment is working. Simple oximetry (tracking the amount of oxygen that is taken in) can be used for screening patients who:  Do not have symptoms (problems) of OSA.  Have a normal Epworth Sleepiness Scale Score.  Have a low pre-test  probability of having OSA.  Have none of the upper airway problems likely to cause apnea.  Oximetry is also used to determine if treatment is effective in patients who showed significant desaturations (not getting enough oxygen) on their home sleep study. One extra measure of safety is to perform additional studies for the person who only snores. This is because no one can predict with absolute certainty who will have OSA. Those who show significant desaturations (not getting enough oxygen) are recommended to have a more detailed sleep study. Document Released: 12/01/2002 Document Revised: 08/19/2011 Document Reviewed: 08/02/2013 Exit[employer] Patient Information 2015 [employer]. This information is not intended to replace advice given to you by your health care provider. Make sure you discuss any questions you have with your health care provider.  
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
## 2014-03-21 ENCOUNTER — Telehealth: Payer: Self-pay | Admitting: Neurology

## 2014-03-21 NOTE — Telephone Encounter (Signed)
Christy at [facility] @ [phone], stated pt informed her Rx for clonazePAM (KLONOPIN) 1 MG tablet was instructed to take 1 tablet at bedtime and 1/2 in the am.  Please call and advise.

## 2014-03-21 NOTE — Telephone Encounter (Signed)
Pharmacy called saying patient says he takes Clonopin 1mg  one in am and one full at night.  They are requesting a new Rx for this dose.  Please advise.  Thank you.

## 2014-03-22 MED ORDER — CLONAZEPAM 1 MG PO TABS: 1.0000 mg | ORAL_TABLET | Freq: Two times a day (BID) | ORAL | Status: DC | PRN

## 2014-03-22 MED ORDER — CLONAZEPAM 1 MG PO TABS: 1.0000 mg | ORAL_TABLET | Freq: Two times a day (BID) | ORAL | Status: DC

## 2014-03-23 ENCOUNTER — Telehealth: Payer: Self-pay | Admitting: Neurology

## 2014-03-23 NOTE — Telephone Encounter (Signed)
I called and spoke with the patient.  He verified he is taking one in am and one in pm as last Rx was sent.  I called the pharmacy back.  They are aware.

## 2014-03-23 NOTE — Telephone Encounter (Signed)
Baxter Flattery from [place] calling to ask questions about patient's Klonopin script, please return call and advise.

## 2014-04-14 ENCOUNTER — Ambulatory Visit (INDEPENDENT_AMBULATORY_CARE_PROVIDER_SITE_OTHER): Payer: 59 | Admitting: Interventional Cardiology

## 2014-04-14 ENCOUNTER — Encounter: Payer: Self-pay | Admitting: Interventional Cardiology

## 2014-04-14 VITALS — BP 126/64 | HR 57 | Ht 71.0 in | Wt 229.0 lb

## 2014-04-14 DIAGNOSIS — I714 Abdominal aortic aneurysm, without rupture, unspecified: Secondary | ICD-10-CM

## 2014-04-14 DIAGNOSIS — G8190 Hemiplegia, unspecified affecting unspecified side: Secondary | ICD-10-CM

## 2014-04-14 DIAGNOSIS — G8191 Hemiplegia, unspecified affecting right dominant side: Secondary | ICD-10-CM

## 2014-04-14 DIAGNOSIS — I2510 Atherosclerotic heart disease of native coronary artery without angina pectoris: Secondary | ICD-10-CM

## 2014-04-14 DIAGNOSIS — I10 Essential (primary) hypertension: Secondary | ICD-10-CM

## 2014-04-14 NOTE — Progress Notes (Signed)
Patient ID: Christian Mccall, male   DOB: 29-May-1956, 58 y.o.   MRN: [id number]    [address], [place]  [address] Phone: [phone] Fax:  [phone]  Date:  04/14/2014   ID:  Christian Mccall, DOB Nov 12, 1955, MRN [id number]  PCP:  Precious Reel, MD   ASSESSMENT:  1. Coronary artery disease with prior infarction, stable with angina 2. Hypertension 3. COPD 4. Hyperlipidemia 5. History of stroke  PLAN:  1. Maintain an active lifestyle 2. No change in cardiac therapy 3. Use nitroglycerin if angina. Call if change in anginal pattern, stable   SUBJECTIVE: Christian Mccall is a 58 y.o. male who is a candidate by his wife and 2 granddaughters. He is doing well. If he gets tired he is chest tightness. He has no discomfort at rest. He denies orthopnea and lower extremity swelling. No palpitations or syncope. He denies claudication. No acute crit oh changing cardiovascular symptoms.   Wt Readings from Last 3 Encounters:  04/14/14 229 lb (103.874 kg)  03/18/14 229 lb (103.874 kg)  02/10/14 227 lb (102.967 kg)     Past Medical History  Diagnosis Date  . Hypertension   . Hyperlipidemia   . Myocardial infarction 2002  . COPD (chronic obstructive pulmonary disease)   . AAA (abdominal aortic aneurysm)   . Neuropathy   . CAD (coronary artery disease)   . Diabetes mellitus 2009  . Arthritis   . Joint pain   . Leg pain     with walking and pain in feet while lying flat  . Chest pain   . Shortness of breath 12/06/10    while lying flat and with exertion  . Reflux   . Stroke June 2014    Mini  pt. had several small strokes  . Stroke Sept. 2015    mini strokes=  at least 10 in last 2 mos.    Current Outpatient Prescriptions  Medication Sig Dispense Refill  . ADVAIR DISKUS 100-50 MCG/DOSE AEPB Inhale 1 puff into the lungs every 12 (twelve) hours.     [name] aspirin 325 MG tablet Take 325 mg by mouth daily.      . cholecalciferol (VITAMIN D) 1000 UNITS tablet Take  1,000 Units by mouth daily.    . clonazePAM (KLONOPIN) 1 MG tablet Take 1 tablet (1 mg total) by mouth 2 (two) times daily as needed for anxiety. 60 tablet 0  . clonazePAM (KLONOPIN) 1 MG tablet Take 1 tablet (1 mg total) by mouth 2 (two) times daily. 60 tablet 0  . clopidogrel (PLAVIX) 75 MG tablet Take 75 mg by mouth daily.      . cyanocobalamin (,VITAMIN B-12,) 1000 MCG/ML injection Inject 1,000 mcg into the muscle. Every 2 months    . escitalopram (LEXAPRO) 20 MG tablet Take 20 mg by mouth at bedtime.    [name] ezetimibe-simvastatin (VYTORIN) 10-40 MG per tablet Take 1 tablet by mouth at bedtime.      . fenofibrate 160 MG tablet Take 160 mg by mouth daily.     . furosemide (LASIX) 40 MG tablet Take 40 mg by mouth every other day.     [name] glimepiride (AMARYL) 4 MG tablet Take 4 mg by mouth at bedtime.     . lansoprazole (PREVACID) 15 MG capsule Take 30 mg by mouth daily.     . lansoprazole (PREVACID) 30 MG capsule   3  . losartan (COZAAR) 50 MG tablet Take 50 mg by  mouth daily.      . metFORMIN (GLUCOPHAGE) 1000 MG tablet Take 500-1,000 mg by mouth 2 (two) times daily with a meal. Take one tab in am and 1 tab at night    . metoprolol (LOPRESSOR) 50 MG tablet Take 50 mg by mouth 2 (two) times daily.     . nitroGLYCERIN (NITROSTAT) 0.4 MG SL tablet Place 0.4 mg under the tongue every 5 (five) minutes as needed for chest pain.    . pramipexole (MIRAPEX) 0.25 MG tablet Take 0.25 mg by mouth at bedtime.     . pregabalin (LYRICA) 100 MG capsule Take 100-200 mg by mouth 2 (two) times daily. One Tab am and two Tabs pm    . ranolazine (RANEXA) 1000 MG SR tablet Take 1,000 mg by mouth 2 (two) times daily.      [name] terazosin (HYTRIN) 1 MG capsule Take 1 mg by mouth at bedtime.      [name] tiotropium (SPIRIVA) 18 MCG inhalation capsule Place 18 mcg into inhaler and inhale daily.     . VENTOLIN HFA 108 (90 BASE) MCG/ACT inhaler Inhale 1 puff into the lungs 2 (two) times daily as needed. For shortness of  breath/difficulty breathing     No current facility-administered medications for this visit.    Allergies:   No Known Allergies  Social History:  The patient  reports that he quit smoking about 13 years ago. His smoking use included Cigarettes. He has a 60 pack-year smoking history. He has never used smokeless tobacco. He reports that he drinks alcohol. He reports that he does not use illicit drugs.   ROS:  Please see the history of present illness.   Denies transient neurological complaints. Abdominal swelling is present. He tells me that Dr. Virgina Jock was managing an enlarged spleen and liver. Platelet counts run low. He has not had blood in the urine or stool. No abdominal bleeding.   All other systems reviewed and negative.   OBJECTIVE: VS:  BP 126/64 mmHg  Pulse 57  Ht 5\' 11"  (1.803 m)  Wt 229 lb (103.874 kg)  BMI 31.95 kg/m2 Well nourished, well developed, in no acute distress, obese abdomen HEENT: normal Neck: JVD flat. Carotid bruit absent  Cardiac:  normal S1, S2; RRR; no murmur Lungs:  clear to auscultation bilaterally, no wheezing, rhonchi or rales Abd: soft, nontender, no hepatomegaly Ext: Edema absent. Pulses 2+ and symmetric at all palpable places in the extremities. Skin: warm and dry Neuro:  CNs 2-12 intact, no focal abnormalities noted  EKG:  Normal sinus rhythm, poor R-wave progression, sinus bradycardia.       Signed, Illene Labrador III, MD 04/14/2014 12:25 PM

## 2014-04-14 NOTE — Patient Instructions (Signed)
Your physician recommends that you continue on your current medications as directed. Please refer to the Current Medication list given to you today.  Your physician wants you to follow-up in: 1 year with Dr.Mchaffie You will receive a reminder letter in the mail two months in advance. If you don't receive a letter, please call our office to schedule the follow-up appointment.  

## 2014-04-26 ENCOUNTER — Ambulatory Visit (INDEPENDENT_AMBULATORY_CARE_PROVIDER_SITE_OTHER): Payer: 59 | Admitting: Neurology

## 2014-04-26 DIAGNOSIS — R0683 Snoring: Secondary | ICD-10-CM

## 2014-04-26 DIAGNOSIS — G4761 Periodic limb movement disorder: Secondary | ICD-10-CM

## 2014-04-26 DIAGNOSIS — G4752 REM sleep behavior disorder: Secondary | ICD-10-CM

## 2014-04-26 NOTE — Sleep Study (Signed)
Please see the scanned sleep study interpretation located in the Procedure tab within the Chart Review section. 

## 2014-05-04 ENCOUNTER — Telehealth: Payer: Self-pay | Admitting: Neurology

## 2014-05-04 NOTE — Telephone Encounter (Signed)
Called the patient to relay the results from his sleep study.  He is aware that there was no significant finding of osa.  However, his sleep study did reveal significant periodic limb movement of sleep resulting in sleep disruption.  The patient has already been started on a trial of Klonopin and was provided the option to follow up with Dr. Brett Fairy to obtain a serum ferritin level.  The patient says that he is scheduled to follow up with Dr. Virgina Jock in 2 weeks and will have his lab work or further evaluation done at his provider's office.  Dr. Virgina Jock has received a copy of the sleep study report and the patient will receive his test result via mail.

## 2014-06-01 ENCOUNTER — Other Ambulatory Visit: Payer: Self-pay | Admitting: Neurology

## 2014-06-01 NOTE — Telephone Encounter (Signed)
Patient's wife is calling for a refill for generic Klonopin. States patient is completely out of medication. Barrett.

## 2014-06-01 NOTE — Telephone Encounter (Signed)
Rx signed and faxed.

## 2014-06-01 NOTE — Telephone Encounter (Signed)
Dr Brett Fairy is out of the office.  Request forwarded to [facility] for approval.

## 2014-07-05 DIAGNOSIS — F329 Major depressive disorder, single episode, unspecified: Secondary | ICD-10-CM | POA: Diagnosis not present

## 2014-07-05 DIAGNOSIS — E1151 Type 2 diabetes mellitus with diabetic peripheral angiopathy without gangrene: Secondary | ICD-10-CM | POA: Diagnosis not present

## 2014-07-05 DIAGNOSIS — Z6830 Body mass index (BMI) 30.0-30.9, adult: Secondary | ICD-10-CM | POA: Diagnosis not present

## 2014-07-05 DIAGNOSIS — I10 Essential (primary) hypertension: Secondary | ICD-10-CM | POA: Diagnosis not present

## 2014-07-05 DIAGNOSIS — E669 Obesity, unspecified: Secondary | ICD-10-CM | POA: Diagnosis not present

## 2014-07-05 DIAGNOSIS — D519 Vitamin B12 deficiency anemia, unspecified: Secondary | ICD-10-CM | POA: Diagnosis not present

## 2014-07-15 ENCOUNTER — Other Ambulatory Visit: Payer: Self-pay

## 2014-07-15 MED ORDER — CLONAZEPAM 1 MG PO TABS: 1.0000 mg | ORAL_TABLET | Freq: Two times a day (BID) | ORAL | Status: DC

## 2014-07-15 NOTE — Telephone Encounter (Signed)
Rx signed and faxed.

## 2014-10-03 DIAGNOSIS — I6529 Occlusion and stenosis of unspecified carotid artery: Secondary | ICD-10-CM | POA: Diagnosis not present

## 2014-10-03 DIAGNOSIS — D519 Vitamin B12 deficiency anemia, unspecified: Secondary | ICD-10-CM | POA: Diagnosis not present

## 2014-10-03 DIAGNOSIS — D692 Other nonthrombocytopenic purpura: Secondary | ICD-10-CM | POA: Diagnosis not present

## 2014-10-03 DIAGNOSIS — I119 Hypertensive heart disease without heart failure: Secondary | ICD-10-CM | POA: Diagnosis not present

## 2014-10-03 DIAGNOSIS — G4752 REM sleep behavior disorder: Secondary | ICD-10-CM | POA: Diagnosis not present

## 2014-10-03 DIAGNOSIS — I517 Cardiomegaly: Secondary | ICD-10-CM | POA: Diagnosis not present

## 2014-10-03 DIAGNOSIS — E1151 Type 2 diabetes mellitus with diabetic peripheral angiopathy without gangrene: Secondary | ICD-10-CM | POA: Diagnosis not present

## 2014-10-03 DIAGNOSIS — I739 Peripheral vascular disease, unspecified: Secondary | ICD-10-CM | POA: Diagnosis not present

## 2014-10-19 DIAGNOSIS — I517 Cardiomegaly: Secondary | ICD-10-CM | POA: Diagnosis not present

## 2014-10-19 DIAGNOSIS — I739 Peripheral vascular disease, unspecified: Secondary | ICD-10-CM | POA: Diagnosis not present

## 2014-10-19 DIAGNOSIS — I70211 Atherosclerosis of native arteries of extremities with intermittent claudication, right leg: Secondary | ICD-10-CM | POA: Diagnosis not present

## 2015-01-02 DIAGNOSIS — I119 Hypertensive heart disease without heart failure: Secondary | ICD-10-CM | POA: Diagnosis not present

## 2015-01-02 DIAGNOSIS — D519 Vitamin B12 deficiency anemia, unspecified: Secondary | ICD-10-CM | POA: Diagnosis not present

## 2015-01-02 DIAGNOSIS — I739 Peripheral vascular disease, unspecified: Secondary | ICD-10-CM | POA: Diagnosis not present

## 2015-01-02 DIAGNOSIS — Z6831 Body mass index (BMI) 31.0-31.9, adult: Secondary | ICD-10-CM | POA: Diagnosis not present

## 2015-01-02 DIAGNOSIS — E1151 Type 2 diabetes mellitus with diabetic peripheral angiopathy without gangrene: Secondary | ICD-10-CM | POA: Diagnosis not present

## 2015-01-02 DIAGNOSIS — I517 Cardiomegaly: Secondary | ICD-10-CM | POA: Diagnosis not present

## 2015-01-02 DIAGNOSIS — I2510 Atherosclerotic heart disease of native coronary artery without angina pectoris: Secondary | ICD-10-CM | POA: Diagnosis not present

## 2015-01-02 DIAGNOSIS — J449 Chronic obstructive pulmonary disease, unspecified: Secondary | ICD-10-CM | POA: Diagnosis not present

## 2015-02-15 ENCOUNTER — Encounter: Payer: Self-pay | Admitting: Family

## 2015-02-16 ENCOUNTER — Other Ambulatory Visit: Payer: Self-pay | Admitting: Vascular Surgery

## 2015-02-16 ENCOUNTER — Ambulatory Visit (INDEPENDENT_AMBULATORY_CARE_PROVIDER_SITE_OTHER): Payer: Commercial Managed Care - HMO | Admitting: Family

## 2015-02-16 ENCOUNTER — Encounter: Payer: Self-pay | Admitting: Family

## 2015-02-16 ENCOUNTER — Ambulatory Visit (HOSPITAL_COMMUNITY)
Admission: RE | Admit: 2015-02-16 | Discharge: 2015-02-16 | Disposition: A | Discharge status: Home / Self Care | Payer: Commercial Managed Care - HMO | Source: Ambulatory Visit | Attending: Family | Admitting: Family

## 2015-02-16 VITALS — BP 143/88 | HR 60 | Ht 71.0 in | Wt 217.8 lb

## 2015-02-16 DIAGNOSIS — Z95828 Presence of other vascular implants and grafts: Secondary | ICD-10-CM

## 2015-02-16 DIAGNOSIS — Z01812 Encounter for preprocedural laboratory examination: Secondary | ICD-10-CM | POA: Diagnosis not present

## 2015-02-16 DIAGNOSIS — Z87891 Personal history of nicotine dependence: Secondary | ICD-10-CM

## 2015-02-16 DIAGNOSIS — I714 Abdominal aortic aneurysm, without rupture, unspecified: Secondary | ICD-10-CM

## 2015-02-16 DIAGNOSIS — Z4889 Encounter for other specified surgical aftercare: Secondary | ICD-10-CM | POA: Diagnosis not present

## 2015-02-16 DIAGNOSIS — Z48812 Encounter for surgical aftercare following surgery on the circulatory system: Secondary | ICD-10-CM

## 2015-02-16 LAB — CREATININE, SERUM: CREATININE: 1.06 mg/dL (ref 0.70–1.33)

## 2015-02-16 NOTE — Progress Notes (Signed)
[facility] [place]  Established EVAR  History of Present Illness  Christian Mccall is a 59 y.o. (17-Jul-1955) male patient of Dr. Oneida Alar who presents for follow-up evaluation of AAA. He underwent Gore Excluder aneurysm stent graft repair in August 2012. The patient denies new abdominal or back pain. He has had episodes recently where pressure on his left abdomen caused him pain near his xiphoid region. This was being worked up by Dr. Virgina Jock. It was improving. The patient's atherosclerotic risk factors remain hypertension, hyperlipidemia, coronary disease, COPD, diabetes. These are all currently stable and followed by his primary care physician. He has also noted that his arm blood pressure is different from one side to the other. This is asymptomatic with no dizziness or exertional fatigue. Advised that he should always have the blood pressure checked in a higher of the two.  Pt denies any known kidney problems, denies allergy to iodine, IV dye, or shellfish.  Pt points to a ventral hernia that feels sore when it protrudes, he also complains of mid low abdominal pain when the ventral hernia protrudes. Excessive gas seen on abdominal Duplex, pt states his abdomen stays distended, denies constipation issues.  He states that he has known lumbar and c-spine issues. He has been hurting in his mid back area for a few months, is intermittent. Pt states he was told he has an enlarged spleen and fatty liver.  Pain in calves with walking, but walking mostly limited by dyspnea, states he has COPD. He sees Dr. Leonie Man for multiple TIA's in the last couple of years. February, 2013 carotid Duplex indicated <40% bilateral ICA stenosis.  02/04/13 CTA abdomen/pelvis reviewed that same day by Dr. Oneida Alar: aneurysm diameter was 5.5 cm. There was no evidence of endoleak. Previously seen type II endoleak was not visualized that day. The top portion of the stent graft was adjacent to the renal arteries  and there was no evidence of migration. No obvious cause for his left sided chest pain at that time.  Pt Diabetic: Yes, states in good control Pt smoker: former smoker, quit at age 45, when he had his CABG    Past Medical History  Diagnosis Date  . Hypertension   . Hyperlipidemia   . Myocardial infarction 2002  . COPD (chronic obstructive pulmonary disease)   . AAA (abdominal aortic aneurysm)   . Neuropathy   . CAD (coronary artery disease)   . Diabetes mellitus 2009  . Arthritis   . Joint pain   . Leg pain     with walking and pain in feet while lying flat  . Chest pain   . Shortness of breath 12/06/10    while lying flat and with exertion  . Reflux   . Stroke June 2014    Mini  pt. had several small strokes  . Stroke Sept. 2015    mini strokes=  at least 10 in last 2 mos.   Past Surgical History  Procedure Laterality Date  . Coronary artery bypass graft  2002  . Abdominal aortic aneurysm repair  01-16-11    EVAR  . Abdominal aortic aneurysm repair     Social History Social History  Substance Use Topics  . Smoking status: Former Smoker -- 2.00 packs/day for 30 years    Types: Cigarettes    Quit date: 06/10/2000  . Smokeless tobacco: Never Used  . Alcohol Use: Yes     Comment: social   Family History Family History  Problem Relation  Age of Onset  . Aneurysm Mother     AAA  . Heart disease Mother     Heart Disease before age 60  . Other Mother     Carotid artery stenosis  . Hyperlipidemia Mother   . Hypertension Mother   . Aneurysm Maternal Aunt     AAA  . Other Maternal Aunt     AAA  . Aneurysm Maternal Uncle     AAA  . Other Maternal Uncle     AAA  . Lupus Sister   . Cancer Sister     Ovarian  . Heart disease Sister   . Heart disease Brother     Heart Disease before age 63  . Hypertension Brother   . Heart attack Brother   . Heart disease Brother     Before age 7  . Heart attack Brother   . Heart attack Brother   . Hyperlipidemia Father    . Hypertension Father    Current Outpatient Prescriptions on File Prior to Visit  Medication Sig Dispense Refill  . ADVAIR DISKUS 100-50 MCG/DOSE AEPB Inhale 1 puff into the lungs every 12 (twelve) hours.     [name] aspirin 325 MG tablet Take 325 mg by mouth daily.      . cholecalciferol (VITAMIN D) 1000 UNITS tablet Take 1,000 Units by mouth daily.    . clonazePAM (KLONOPIN) 1 MG tablet Take 1 tablet (1 mg total) by mouth 2 (two) times daily. 60 tablet 5  . clopidogrel (PLAVIX) 75 MG tablet Take 75 mg by mouth daily.      . cyanocobalamin (,VITAMIN B-12,) 1000 MCG/ML injection Inject 1,000 mcg into the muscle. Every 2 months    . escitalopram (LEXAPRO) 20 MG tablet Take 20 mg by mouth at bedtime.    [name] ezetimibe-simvastatin (VYTORIN) 10-40 MG per tablet Take 1 tablet by mouth at bedtime.      . fenofibrate 160 MG tablet Take 160 mg by mouth daily.     . furosemide (LASIX) 40 MG tablet Take 40 mg by mouth every other day.     [name] glimepiride (AMARYL) 4 MG tablet Take 4 mg by mouth at bedtime.     . lansoprazole (PREVACID) 15 MG capsule Take 30 mg by mouth daily.     . lansoprazole (PREVACID) 30 MG capsule   3  . losartan (COZAAR) 50 MG tablet Take 50 mg by mouth daily.      . metFORMIN (GLUCOPHAGE) 1000 MG tablet Take 500-1,000 mg by mouth 2 (two) times daily with a meal. Take one tab in am and 1 tab at night    . metoprolol (LOPRESSOR) 50 MG tablet Take 50 mg by mouth 2 (two) times daily.     . nitroGLYCERIN (NITROSTAT) 0.4 MG SL tablet Place 0.4 mg under the tongue every 5 (five) minutes as needed for chest pain.    . pramipexole (MIRAPEX) 0.25 MG tablet Take 0.25 mg by mouth at bedtime.     . pregabalin (LYRICA) 100 MG capsule Take 100-200 mg by mouth 2 (two) times daily. One Tab am and two Tabs pm    . ranolazine (RANEXA) 1000 MG SR tablet Take 1,000 mg by mouth 2 (two) times daily.      [name] terazosin (HYTRIN) 1 MG capsule Take 1 mg by mouth at bedtime.      [name] tiotropium (SPIRIVA) 18 MCG  inhalation capsule Place 18 mcg into inhaler and inhale daily.     Christian Mccall HFA  108 (90 BASE) MCG/ACT inhaler Inhale 1 puff into the lungs 2 (two) times daily as needed. For shortness of breath/difficulty breathing     No current facility-administered medications on file prior to visit.   No Known Allergies   ROS: See HPI for pertinent positives and negatives.  Physical Examination  Filed Vitals:   02/16/15 0924 02/16/15 0929  BP: 144/81 143/88  Pulse: 60   Height: 5\' 11"  (1.803 m)   Weight: 217 lb 12.8 oz (98.793 kg)   SpO2: 93%    Body mass index is 30.39 kg/(m^2).   General: A&O x 3, WD, obese male.  Pulmonary: Sym exp, good air movt, CTAB, no rales, rhonchi, or wheezing.  Cardiac: RRR, Nl S1, S2, no murmur appreciated  Vascular: Vessel Right Left  Radial 2+Palpable 2+Palpable  Carotid palpablewithout bruit palpablewithout bruit  Aorta Not palpable N/A  Femoral Not Palpable(obese) Not Palpable(obese)  Popliteal Not palpable 1+ palpable  PT 2+Palpable 2+Palpable  DP 1+Palpable 1+Palpable   Gastrointestinal: soft, NTND, -G/R, - HSM, large reducible ventral hernia, - CVAT B.  Musculoskeletal: M/S 5/5 in right UE and LE, 4/5 in left LE and UE, Extremities without ischemic changes.  Neurologic: CN 2-12 intact except mild left facial droop, Motor exam as listed above. Speech is normal.          CTA Abd/Pelvis Duplex (Date: 02/02/13)  AAA sac size: 5.5 cm  no endoleak detected    Non-Invasive Vascular Imaging  EVAR Duplex (Date: 02/16/2015) ABDOMINAL AORTA DUPLEX EVALUATION - POST ENDOVASCULAR REPAIR    INDICATION: Evaluation of abdominal aortic aneurysm repair    PREVIOUS INTERVENTION(S): Gore excluder aneurysm stent graft repair 01/2011    DUPLEX EXAM:      DIAMETER AP (cm) DIAMETER TRANSVERSE (cm) VELOCITIES (cm/sec)  Aorta 5.2 5.4 82  Right Common Iliac 1.4 1.5 36  Left Common Iliac 1.4 1.3 76    Comparison Study        Date DIAMETER AP (cm) DIAMETER TRANSVERSE (cm)  02/10/2014 4.6 -     ADDITIONAL FINDINGS:     IMPRESSION: 5.2 x 5.4 cm sac size    Compared to the previous exam:  Slight increase       Medical Decision Making  Christian Mccall is a 59 y.o. male who presents s/p EVAR (Date: August, 2012).  Pt is asymptomatic with increase in sac size to 5.4 cm from 4.6 cm in a year.  I discussed with the patient the importance of surveillance of the endograft.  I discussed today's Duplex results with Dr. Oneida Alar.  The next CTA will be scheduled for 2-4 weeks and follow up with Dr. Oneida Alar  I emphasized the importance of maximal medical management including strict control of blood pressure, blood glucose, and lipid levels, antiplatelet agents, obtaining regular exercise, and cessation of smoking.   Thank you for allowing [place] to participate in this patient's care.  Clemon Chambers, RN, MSN, FNP-C [facility] [place] Office: [phone]  Clinic Physician: Oneida Alar  02/16/2015, 9:09 AM

## 2015-02-17 ENCOUNTER — Telehealth: Payer: Self-pay | Admitting: Vascular Surgery

## 2015-02-17 NOTE — Telephone Encounter (Signed)
-----   Message from Georgiann Mccoy sent at 02/17/2015 12:35 PM EDT ----- Regarding: cta precert CTA Abd/Pel  Dx: Z61.096, E45.40, J81.191  03/02/15  Lilly  NP/CEF  Thank you! Selinda Eon

## 2015-02-17 NOTE — Telephone Encounter (Signed)
Approval received from [facility] #[id number]. However, the facility was incorrect, I have sent a message through [employer] to have this changed to  [facility] dpm

## 2015-02-17 NOTE — Addendum Note (Signed)
Addended by: Thresa Ross C on: 02/17/2015 11:35 AM   Modules accepted: Orders

## 2015-03-02 ENCOUNTER — Ambulatory Visit (HOSPITAL_COMMUNITY): Payer: Commercial Managed Care - HMO

## 2015-03-06 DIAGNOSIS — M7501 Adhesive capsulitis of right shoulder: Secondary | ICD-10-CM | POA: Diagnosis not present

## 2015-03-07 ENCOUNTER — Encounter: Payer: Self-pay | Admitting: Vascular Surgery

## 2015-03-07 ENCOUNTER — Ambulatory Visit (HOSPITAL_COMMUNITY)
Admission: RE | Admit: 2015-03-07 | Discharge: 2015-03-07 | Disposition: A | Discharge status: Home / Self Care | Payer: Commercial Managed Care - HMO | Source: Ambulatory Visit | Attending: Family | Admitting: Family

## 2015-03-07 ENCOUNTER — Encounter (HOSPITAL_COMMUNITY): Payer: Self-pay

## 2015-03-07 DIAGNOSIS — Z48812 Encounter for surgical aftercare following surgery on the circulatory system: Secondary | ICD-10-CM | POA: Diagnosis present

## 2015-03-07 DIAGNOSIS — N400 Enlarged prostate without lower urinary tract symptoms: Secondary | ICD-10-CM | POA: Diagnosis not present

## 2015-03-07 DIAGNOSIS — T82330A Leakage of aortic (bifurcation) graft (replacement), initial encounter: Secondary | ICD-10-CM | POA: Diagnosis not present

## 2015-03-07 DIAGNOSIS — Y832 Surgical operation with anastomosis, bypass or graft as the cause of abnormal reaction of the patient, or of later complication, without mention of misadventure at the time of the procedure: Secondary | ICD-10-CM | POA: Insufficient documentation

## 2015-03-07 DIAGNOSIS — I719 Aortic aneurysm of unspecified site, without rupture: Secondary | ICD-10-CM | POA: Insufficient documentation

## 2015-03-07 DIAGNOSIS — Z95828 Presence of other vascular implants and grafts: Secondary | ICD-10-CM

## 2015-03-07 DIAGNOSIS — I700 Atherosclerosis of aorta: Secondary | ICD-10-CM | POA: Insufficient documentation

## 2015-03-07 MED ORDER — IOHEXOL 350 MG/ML SOLN
100.0000 mL | Freq: Once | INTRAVENOUS | Status: AC | PRN
  Administered 2015-03-07: 100 mL via INTRAVENOUS

## 2015-03-09 ENCOUNTER — Encounter: Payer: Self-pay | Admitting: Vascular Surgery

## 2015-03-09 ENCOUNTER — Ambulatory Visit (INDEPENDENT_AMBULATORY_CARE_PROVIDER_SITE_OTHER): Payer: Commercial Managed Care - HMO | Admitting: Vascular Surgery

## 2015-03-09 ENCOUNTER — Other Ambulatory Visit: Payer: Self-pay

## 2015-03-09 VITALS — BP 143/82 | HR 62 | Ht 71.0 in | Wt 220.0 lb

## 2015-03-09 DIAGNOSIS — I714 Abdominal aortic aneurysm, without rupture, unspecified: Secondary | ICD-10-CM

## 2015-03-09 NOTE — Progress Notes (Signed)
      History of Present Illness  Christian Mccall is a 59 y.o. (12-Jan-1956) male who presents for follow up s/p EVAR (Date: 01/2011).  He was last seen in the office on 02/16/15 by Clemon Chambers, NP where his EVAR duplex demonstrated a slight increase in sac size.   He was advised to follow-up in 2-4 weeks for a CTA.   The patient has not had any new back or abdominal pain. He is a former smoker (quit 15 years ago). He takes plavix and aspirin.    Physical Examination  Filed Vitals:   03/09/15 1342  BP: 143/82  Pulse: 62  Height: 5\' 11"  (1.803 m)  Weight: 220 lb (99.791 kg)  SpO2: 94%   Body mass index is 30.7 kg/(m^2).  General: A&O x 3, WDWN obese male in NAD  Pulmonary: Sym exp, good air movt, CTAB, no rales, rhonchi, & wheezing  Cardiac: RRR, Nl S1, S2, no Murmurs, rubs or gallops, no carotid bruits  Vascular: palpable femoral and dorsalis pedis pulses bilaterally  Gastrointestinal: soft, NTND, diastasis recti   Musculoskeletal: No muscle wasting or atrophy  Neurologic: No focal deficits  Non-Invasive Vascular Imaging  CTA Abd/Pelvis Duplex (Date: 03/07/2015)  AAA sac size: 6.2 cm x 5.7 cm  Type II endoleak detected involving lumbar   Medical Decision Making  Christian Mccall is a 59 y.o. male who presents s/p EVAR.  Pt is asymptomatic with increase in native sac size and type II endoleak.   He will be scheduled for an aortogram with possible embolization of endoleak tomorrow with Dr. Oneida Alar. If embolization is not possible, he will return to the office for further discussion. The patient understands the procedure, risks and benefits and is willing to proceed.   Christian Jock, PA-C [facility] [place] Office: [phone] Pager: [phone]  03/09/2015, 2:16 PM  This patient was seen in conjunction with Dr. Oneida Alar   History and exam details as above. Patient with a type II endoleak most likely from a lumbar artery. He has had  expansion of his aneurysm. Plan is for diagnostic arteriogram possible intervention tomorrow if we can find the source of the type II endoleak. Risks benefits possible complications and procedure details were discussed with the patient today. He understands and agrees to proceed.  Ruta Hinds, MD [facility] [place] Office: [phone] Pager: [phone]

## 2015-03-10 ENCOUNTER — Ambulatory Visit (HOSPITAL_COMMUNITY)
Admission: RE | Admit: 2015-03-10 | Discharge: 2015-03-10 | Disposition: A | Discharge status: Home / Self Care | Payer: Commercial Managed Care - HMO | Source: Ambulatory Visit | Attending: Vascular Surgery | Admitting: Vascular Surgery

## 2015-03-10 ENCOUNTER — Encounter (HOSPITAL_COMMUNITY): Payer: Self-pay | Admitting: Vascular Surgery

## 2015-03-10 ENCOUNTER — Encounter (HOSPITAL_COMMUNITY)
Admission: RE | Disposition: A | Discharge status: Home / Self Care | Payer: Self-pay | Source: Ambulatory Visit | Attending: Vascular Surgery

## 2015-03-10 DIAGNOSIS — Y812 Prosthetic and other implants, materials and accessory general- and plastic-surgery devices associated with adverse incidents: Secondary | ICD-10-CM | POA: Insufficient documentation

## 2015-03-10 DIAGNOSIS — Z48812 Encounter for surgical aftercare following surgery on the circulatory system: Secondary | ICD-10-CM | POA: Diagnosis not present

## 2015-03-10 DIAGNOSIS — I714 Abdominal aortic aneurysm, without rupture: Secondary | ICD-10-CM | POA: Diagnosis not present

## 2015-03-10 DIAGNOSIS — E669 Obesity, unspecified: Secondary | ICD-10-CM | POA: Insufficient documentation

## 2015-03-10 DIAGNOSIS — Z87891 Personal history of nicotine dependence: Secondary | ICD-10-CM | POA: Diagnosis not present

## 2015-03-10 DIAGNOSIS — T82898A Other specified complication of vascular prosthetic devices, implants and grafts, initial encounter: Secondary | ICD-10-CM | POA: Insufficient documentation

## 2015-03-10 DIAGNOSIS — Z6830 Body mass index (BMI) 30.0-30.9, adult: Secondary | ICD-10-CM | POA: Diagnosis not present

## 2015-03-10 HISTORY — PX: PERIPHERAL VASCULAR CATHETERIZATION: SHX172C

## 2015-03-10 LAB — POCT I-STAT, CHEM 8
BUN: 17 mg/dL (ref 6–20)
CHLORIDE: 103 mmol/L (ref 101–111)
Calcium, Ion: 1.2 mmol/L (ref 1.12–1.23)
Creatinine, Ser: 0.9 mg/dL (ref 0.61–1.24)
GLUCOSE: 173 mg/dL — AB (ref 65–99)
HCT: 47 % (ref 39.0–52.0)
Hemoglobin: 16 g/dL (ref 13.0–17.0)
POTASSIUM: 3.8 mmol/L (ref 3.5–5.1)
Sodium: 140 mmol/L (ref 135–145)
TCO2: 22 mmol/L (ref 0–100)

## 2015-03-10 LAB — GLUCOSE, CAPILLARY: GLUCOSE-CAPILLARY: 145 mg/dL — AB (ref 65–99)

## 2015-03-10 SURGERY — ABDOMINAL AORTOGRAM

## 2015-03-10 MED ORDER — ONDANSETRON HCL 4 MG/2ML IJ SOLN: 4.0000 mg | Freq: Four times a day (QID) | INTRAMUSCULAR | Status: DC | PRN

## 2015-03-10 MED ORDER — HYDRALAZINE HCL 20 MG/ML IJ SOLN: 5.0000 mg | INTRAMUSCULAR | Status: DC | PRN

## 2015-03-10 MED ORDER — METOPROLOL TARTRATE 1 MG/ML IV SOLN: 2.0000 mg | INTRAVENOUS | Status: DC | PRN

## 2015-03-10 MED ORDER — LABETALOL HCL 5 MG/ML IV SOLN: 10.0000 mg | INTRAVENOUS | Status: DC | PRN

## 2015-03-10 MED ORDER — LIDOCAINE HCL (PF) 1 % IJ SOLN
INTRAMUSCULAR | Status: AC
  Filled 2015-03-10: qty 30

## 2015-03-10 MED ORDER — LIDOCAINE HCL (PF) 1 % IJ SOLN
INTRAMUSCULAR | Status: DC | PRN
  Administered 2015-03-10: 12 mL

## 2015-03-10 MED ORDER — OXYCODONE HCL 5 MG PO TABS: 5.0000 mg | ORAL_TABLET | ORAL | Status: DC | PRN

## 2015-03-10 MED ORDER — MORPHINE SULFATE (PF) 10 MG/ML IV SOLN: 2.0000 mg | INTRAVENOUS | Status: DC | PRN

## 2015-03-10 MED ORDER — ACETAMINOPHEN 325 MG PO TABS
325.0000 mg | ORAL_TABLET | ORAL | Status: DC | PRN
Start: 2015-03-10 — End: 2015-03-10
  Filled 2015-03-10: qty 2

## 2015-03-10 MED ORDER — ACETAMINOPHEN 325 MG RE SUPP
325.0000 mg | RECTAL | Status: DC | PRN
  Filled 2015-03-10: qty 2

## 2015-03-10 MED ORDER — SODIUM CHLORIDE 0.45 % IV SOLN
INTRAVENOUS | Status: DC
  Administered 2015-03-10: 500 mL via INTRAVENOUS

## 2015-03-10 MED ORDER — SODIUM CHLORIDE 0.9 % IV SOLN
INTRAVENOUS | Status: DC
  Administered 2015-03-10: 07:00:00 via INTRAVENOUS

## 2015-03-10 MED ORDER — HEPARIN (PORCINE) IN NACL 2-0.9 UNIT/ML-% IJ SOLN
INTRAMUSCULAR | Status: AC
  Filled 2015-03-10: qty 1000

## 2015-03-10 SURGICAL SUPPLY — 15 items
CATH 4FR SITESEER DIAG PIG 145 (CATHETERS) ×2 IMPLANT
CATH ANGIO 5F BER2 65CM (CATHETERS) ×2 IMPLANT
CATH CROSS OVER TEMPO 5F (CATHETERS) ×4 IMPLANT
CATH SOFT-VU 4F 65 STRAIGHT (CATHETERS) ×1 IMPLANT
CATH SOFT-VU STRAIGHT 4F 65CM (CATHETERS) ×2
CATH SOS OMNI O 5F 80CM (CATHETERS) ×2 IMPLANT
COVER PRB 48X5XTLSCP FOLD TPE (BAG) ×1 IMPLANT
COVER PROBE 5X48 (BAG) ×2
GLIDEWIRE ADV .035X180CM (WIRE) ×2 IMPLANT
KIT PV (KITS) ×2 IMPLANT
SHEATH PINNACLE 5F 10CM (SHEATH) ×2 IMPLANT
SYR MEDRAD MARK V 150ML (SYRINGE) ×2 IMPLANT
TRANSDUCER W/STOPCOCK (MISCELLANEOUS) ×2 IMPLANT
TRAY PV CATH (CUSTOM PROCEDURE TRAY) ×2 IMPLANT
WIRE HITORQ VERSACORE ST 145CM (WIRE) ×2 IMPLANT

## 2015-03-10 NOTE — Interval H&P Note (Signed)
History and Physical Interval Note:  03/10/2015 7:40 AM  Christian Mccall  has presented today for surgery, with the diagnosis of type 2 endo leak  The various methods of treatment have been discussed with the patient and family. After consideration of risks, benefits and other options for treatment, the patient has consented to  Procedure(s): Abdominal Aortogram (N/A) as a surgical intervention .  The patient's history has been reviewed, patient examined, no change in status, stable for surgery.  I have reviewed the patient's chart and labs.  Questions were answered to the patient's satisfaction.     Ruta Hinds

## 2015-03-10 NOTE — Progress Notes (Signed)
Site area: RFA Site Prior to Removal:  Level ) Pressure Applied For:65min Manual:   yes Patient Status During Pull: stable  Post Pull Site:  Level 0    Post Pull Instructions Given: yes  Post Pull Pulses Present: palpable Dressing Applied:  clear Bedrest begins @ 0930 till 1430 Comments:

## 2015-03-10 NOTE — H&P (View-Only) (Signed)
      History of Present Illness  Christian Mccall is a 59 y.o. (03-16-1956) male who presents for follow up s/p EVAR (Date: 01/2011).  He was last seen in the office on 02/16/15 by Clemon Chambers, NP where his EVAR duplex demonstrated a slight increase in sac size.   He was advised to follow-up in 2-4 weeks for a CTA.   The patient has not had any new back or abdominal pain. He is a former smoker (quit 15 years ago). He takes plavix and aspirin.    Physical Examination  Filed Vitals:   03/09/15 1342  BP: 143/82  Pulse: 62  Height: 5\' 11"  (1.803 m)  Weight: 220 lb (99.791 kg)  SpO2: 94%   Body mass index is 30.7 kg/(m^2).  General: A&O x 3, WDWN obese male in NAD  Pulmonary: Sym exp, good air movt, CTAB, no rales, rhonchi, & wheezing  Cardiac: RRR, Nl S1, S2, no Murmurs, rubs or gallops, no carotid bruits  Vascular: palpable femoral and dorsalis pedis pulses bilaterally  Gastrointestinal: soft, NTND, diastasis recti   Musculoskeletal: No muscle wasting or atrophy  Neurologic: No focal deficits  Non-Invasive Vascular Imaging  CTA Abd/Pelvis Duplex (Date: 03/07/2015)  AAA sac size: 6.2 cm x 5.7 cm  Type II endoleak detected involving lumbar   Medical Decision Making  Christian Mccall is a 59 y.o. male who presents s/p EVAR.  Pt is asymptomatic with increase in native sac size and type II endoleak.   He will be scheduled for an aortogram with possible embolization of endoleak tomorrow with Dr. Oneida Alar. If embolization is not possible, he will return to the office for further discussion. The patient understands the procedure, risks and benefits and is willing to proceed.   Virgina Jock, PA-C [facility] [place] Office: [phone] Pager: [phone]  03/09/2015, 2:16 PM  This patient was seen in conjunction with Dr. Oneida Alar   History and exam details as above. Patient with a type II endoleak most likely from a lumbar artery. He has had  expansion of his aneurysm. Plan is for diagnostic arteriogram possible intervention tomorrow if we can find the source of the type II endoleak. Risks benefits possible complications and procedure details were discussed with the patient today. He understands and agrees to proceed.  Ruta Hinds, MD [facility] [place] Office: [phone] Pager: [phone]

## 2015-03-10 NOTE — Discharge Instructions (Signed)
Angiogram, Care After °Refer to this sheet in the next few weeks. These instructions provide you with information on caring for yourself after your procedure. Your health care provider may also give you more specific instructions. Your treatment has been planned according to current medical practices, but problems sometimes occur. Call your health care provider if you have any problems or questions after your procedure.  °WHAT TO EXPECT AFTER THE PROCEDURE °After your procedure, it is typical to have the following sensations: °· Minor discomfort or tenderness and a small bump at the catheter insertion site. The bump should usually decrease in size and tenderness within 1 to 2 weeks. °· Any bruising will usually fade within 2 to 4 weeks. °HOME CARE INSTRUCTIONS  °· You may need to keep taking blood thinners if they were prescribed for you. Take medicines only as directed by your health care provider. °· Do not apply powder or lotion to the site. °· Do not take baths, swim, or use a hot tub until your health care provider approves. °· You may shower 24 hours after the procedure. Remove the bandage (dressing) and gently wash the site with plain soap and water. Gently pat the site dry. °· Inspect the site at least twice daily. °· Limit your activity for the first 48 hours. Do not bend, squat, or lift anything over 20 lb (9 kg) or as directed by your health care provider. °· Plan to have someone take you home after the procedure. Follow instructions about when you can drive or return to work. °SEEK MEDICAL CARE IF: °· You get light-headed when standing up. °· You have drainage (other than a small amount of blood on the dressing). °· You have chills. °· You have a fever. °· You have redness, warmth, swelling, or pain at the insertion site. °SEEK IMMEDIATE MEDICAL CARE IF:  °· You develop chest pain or shortness of breath, feel faint, or pass out. °· You have bleeding, swelling larger than a walnut, or drainage from the  catheter insertion site. °· You develop pain, discoloration, coldness, or severe bruising in the leg or arm that held the catheter. °· You develop bleeding from any other place, such as the bowels. You may see bright red blood in your urine or stools, or your stools may appear black and tarry. °· You have heavy bleeding from the site. If this happens, hold pressure on the site. °MAKE SURE YOU: °· Understand these instructions. °· Will watch your condition. °· Will get help right away if you are not doing well or get worse. °Document Released: 12/13/2004 Document Revised: 10/11/2013 Document Reviewed: 10/19/2012 °ExitCare® Patient Information ©2015 [employer]. This information is not intended to replace advice given to you by your health care provider. Make sure you discuss any questions you have with your health care provider. °Angiogram, Care After °Refer to this sheet in the next few weeks. These instructions provide you with information on caring for yourself after your procedure. Your health care provider may also give you more specific instructions. Your treatment has been planned according to current medical practices, but problems sometimes occur. Call your health care provider if you have any problems or questions after your procedure.  °WHAT TO EXPECT AFTER THE PROCEDURE °After your procedure, it is typical to have the following sensations: °· Minor discomfort or tenderness and a small bump at the catheter insertion site. The bump should usually decrease in size and tenderness within 1 to 2 weeks. °· Any bruising will usually   fade within 2 to 4 weeks. °HOME CARE INSTRUCTIONS  °· You may need to keep taking blood thinners if they were prescribed for you. Take medicines only as directed by your health care provider. °· Do not apply powder or lotion to the site. °· Do not take baths, swim, or use a hot tub until your health care provider approves. °· You may shower 24 hours after the procedure. Remove the  bandage (dressing) and gently wash the site with plain soap and water. Gently pat the site dry. °· Inspect the site at least twice daily. °· Limit your activity for the first 48 hours. Do not bend, squat, or lift anything over 20 lb (9 kg) or as directed by your health care provider. °· Plan to have someone take you home after the procedure. Follow instructions about when you can drive or return to work. °SEEK MEDICAL CARE IF: °· You get light-headed when standing up. °· You have drainage (other than a small amount of blood on the dressing). °· You have chills. °· You have a fever. °· You have redness, warmth, swelling, or pain at the insertion site. °SEEK IMMEDIATE MEDICAL CARE IF:  °· You develop chest pain or shortness of breath, feel faint, or pass out. °· You have bleeding, swelling larger than a walnut, or drainage from the catheter insertion site. °· You develop pain, discoloration, coldness, or severe bruising in the leg or arm that held the catheter. °· You develop bleeding from any other place, such as the bowels. You may see bright red blood in your urine or stools, or your stools may appear black and tarry. °· You have heavy bleeding from the site. If this happens, hold pressure on the site. °MAKE SURE YOU: °· Understand these instructions. °· Will watch your condition. °· Will get help right away if you are not doing well or get worse. °Document Released: 12/13/2004 Document Revised: 10/11/2013 Document Reviewed: 10/19/2012 °ExitCare® Patient Information ©2015 [employer]. This information is not intended to replace advice given to you by your health care provider. Make sure you discuss any questions you have with your health care provider. ° °

## 2015-03-11 ENCOUNTER — Other Ambulatory Visit: Payer: Self-pay | Admitting: *Deleted

## 2015-03-11 DIAGNOSIS — T82330A Leakage of aortic (bifurcation) graft (replacement), initial encounter: Secondary | ICD-10-CM

## 2015-03-11 DIAGNOSIS — IMO0002 Reserved for concepts with insufficient information to code with codable children: Secondary | ICD-10-CM

## 2015-03-13 ENCOUNTER — Telehealth: Payer: Self-pay | Admitting: Vascular Surgery

## 2015-03-13 ENCOUNTER — Telehealth: Payer: Self-pay

## 2015-03-13 NOTE — Telephone Encounter (Signed)
Notified the Triage nurse at [facility] of pt's complaints, hx of increase in Endoleak, and recommendation to come to the ER.  Notified Dr. Donnetta Hutching per page, of sending pt. to ER.

## 2015-03-13 NOTE — Telephone Encounter (Signed)
Pt. Called to report onset of back pain during night.  Reported it woke him up approx. 2:00 AM.  Reported he has constant pain at belt level across his back.  Reported the pain woke him up about every 1-2 hrs.  Stated it has been constant since the onset at about 2:00 AM.  Rated the pain at 3/10.  Denied any abdominal pain.  Denied nausea/ vomiting.  Stated that he does have disc problems in the low back, but "this pain is different."   Reported he is scheduled to see the Interventional Radiologist tomorrow for Endo leak evaluation.  Advised pt. to go to the ER, due to the concern of the endo leak enlarging, and risk of rupture.  Verb. Understanding.

## 2015-03-13 NOTE — Telephone Encounter (Signed)
-----   Message from Mena Goes, RN sent at 03/11/2015  1:45 PM EDT ----- Regarding: RE: Needs referral/order for IR Dr Laurence Ferrari Done  :) ----- Message -----    From: Gena Fray    Sent: 03/11/2015   9:21 AM      To: [facility] Clinical Pool Subject: Needs referral/order for IR Dr Laurence Ferrari      Dr Oneida Alar asked [place] to have [place] schedule an appointment with Dr Laurence Ferrari- IR regarding a type 2 endoleak for Mr Christian Mccall Sep 05, 1955. I am unable to schedule this until an order for "IR-Rad eval" is put into [facility].   Would someone please enter the order for IR-Rad eval and then reply to this message so that I know I can call to schedule?  Thank you so much! Hinton Dyer

## 2015-03-13 NOTE — Telephone Encounter (Signed)
LM for IR to call to schedule appt ([phone], IR)

## 2015-03-14 ENCOUNTER — Ambulatory Visit
Admission: RE | Admit: 2015-03-14 | Discharge: 2015-03-14 | Disposition: A | Discharge status: Home / Self Care | Payer: Commercial Managed Care - HMO | Source: Ambulatory Visit | Attending: Vascular Surgery | Admitting: Vascular Surgery

## 2015-03-14 DIAGNOSIS — H40013 Open angle with borderline findings, low risk, bilateral: Secondary | ICD-10-CM | POA: Diagnosis not present

## 2015-03-14 DIAGNOSIS — I9789 Other postprocedural complications and disorders of the circulatory system, not elsewhere classified: Secondary | ICD-10-CM | POA: Insufficient documentation

## 2015-03-14 DIAGNOSIS — T82330A Leakage of aortic (bifurcation) graft (replacement), initial encounter: Secondary | ICD-10-CM

## 2015-03-14 DIAGNOSIS — M7501 Adhesive capsulitis of right shoulder: Secondary | ICD-10-CM | POA: Diagnosis not present

## 2015-03-14 DIAGNOSIS — D3190 Benign neoplasm of unspecified part of unspecified eye: Secondary | ICD-10-CM | POA: Diagnosis not present

## 2015-03-14 DIAGNOSIS — IMO0002 Reserved for concepts with insufficient information to code with codable children: Secondary | ICD-10-CM

## 2015-03-14 DIAGNOSIS — E119 Type 2 diabetes mellitus without complications: Secondary | ICD-10-CM | POA: Diagnosis not present

## 2015-03-14 DIAGNOSIS — H04123 Dry eye syndrome of bilateral lacrimal glands: Secondary | ICD-10-CM | POA: Diagnosis not present

## 2015-03-14 NOTE — Consult Note (Signed)
Chief Complaint: Patient was seen in consultation today for  Chief Complaint  Patient presents with  . Advice Only    Consult for Endoleak Repair     at the request of Fields,Christian Mccall  Referring Physician(s): Fields,Christian Mccall  History of Present Illness: Christian Mccall is a 59 y.o. male with a history of abdominal aortic aneurysm status post endovascular aortic repair in August 2012. He has a known type II endoleak which has been followed.  His aneurysm demonstrates definitive enlargement from 5.5 cm to up to 6.2 cm between August 2014 and September 2016.  Yesterday, he began to develop some lower back pain radiating in a horizontal fashion just above his iliac crests. This is significantly better today. He feels this is different than his typical chronic back pain but is isolated to the back. No evidence of radiation into the flanks or abdomen. He denies new onset dizziness, or hypotension.  After examining him, I feel this is most likely musculoskeletal in origin and not related to his underlying aneurysm.  Otherwise, he is clinically asymptomatic.  Past Medical History  Diagnosis Date  . Hypertension   . Hyperlipidemia   . Myocardial infarction 2002  . COPD (chronic obstructive pulmonary disease)   . AAA (abdominal aortic aneurysm)   . Neuropathy   . CAD (coronary artery disease)   . Diabetes mellitus 2009  . Arthritis   . Joint pain   . Leg pain     with walking and pain in feet while lying flat  . Chest pain   . Shortness of breath 12/06/10    while lying flat and with exertion  . Reflux   . Stroke June 2014    Mini  pt. had several small strokes  . Stroke Sept. 2015    mini strokes=  at least 10 in last 2 mos.    Past Surgical History  Procedure Laterality Date  . Coronary artery bypass graft  2002  . Abdominal aortic aneurysm repair  01-16-11    EVAR  . Abdominal aortic aneurysm repair    . Peripheral vascular catheterization N/A 03/10/2015    Procedure:  Abdominal Aortogram;  Surgeon: Elam Dutch, MD;  Location: [facility];  Service: Cardiovascular;  Laterality: N/A;    Allergies: Review of patient's allergies indicates no known allergies.  Medications: Prior to Admission medications   Medication Sig Start Date End Date Taking? Authorizing Provider  ADVAIR DISKUS 100-50 MCG/DOSE AEPB Inhale 1 puff into the lungs daily.  10/16/12   Historical Provider, MD  aspirin 325 MG tablet Take 325 mg by mouth daily.      Historical Provider, MD  cholecalciferol (VITAMIN D) 1000 UNITS tablet Take 1,000 Units by mouth daily.    Historical Provider, MD  clonazePAM (KLONOPIN) 1 MG tablet Take 1 tablet (1 mg total) by mouth 2 (two) times daily. 07/15/14   Larey Seat, MD  clopidogrel (PLAVIX) 75 MG tablet Take 75 mg by mouth daily.      Historical Provider, MD  cyanocobalamin (,VITAMIN B-12,) 1000 MCG/ML injection Inject 1,000 mcg into the muscle. Every 2 months    Historical Provider, MD  escitalopram (LEXAPRO) 20 MG tablet Take 20 mg by mouth at bedtime.    Historical Provider, MD  ezetimibe-simvastatin (VYTORIN) 10-40 MG per tablet Take 1 tablet by mouth at bedtime.      Historical Provider, MD  fenofibrate 160 MG tablet Take 160 mg by mouth daily.  11/03/10  Historical Provider, MD  furosemide (LASIX) 40 MG tablet Take 40 mg by mouth every other day.     Historical Provider, MD  glimepiride (AMARYL) 4 MG tablet Take 4 mg by mouth at bedtime.     Historical Provider, MD  JARDIANCE 10 MG TABS tablet Take 10 mg by mouth daily.  01/04/15   Historical Provider, MD  lansoprazole (PREVACID) 30 MG capsule Take 30 mg by mouth daily.  03/11/14   Historical Provider, MD  losartan (COZAAR) 50 MG tablet Take 50 mg by mouth daily.      Historical Provider, MD  metFORMIN (GLUCOPHAGE) 1000 MG tablet Take 1,000 mg by mouth 2 (two) times daily with a meal.     Historical Provider, MD  metoprolol (LOPRESSOR) 50 MG tablet Take 50 mg by mouth 2 (two) times daily.      Historical Provider, MD  nitroGLYCERIN (NITROSTAT) 0.4 MG SL tablet Place 0.4 mg under the tongue every 5 (five) minutes as needed for chest pain.    Historical Provider, MD  pramipexole (MIRAPEX) 0.25 MG tablet Take 0.25 mg by mouth at bedtime.  03/24/12   Historical Provider, MD  pregabalin (LYRICA) 100 MG capsule Take 100-200 mg by mouth 2 (two) times daily. One Tab am and two Tabs pm    Historical Provider, MD  ranolazine (RANEXA) 1000 MG SR tablet Take 1,000 mg by mouth 2 (two) times daily.      Historical Provider, MD  terazosin (HYTRIN) 1 MG capsule Take 1 mg by mouth at bedtime.      Historical Provider, MD  tiotropium (SPIRIVA) 18 MCG inhalation capsule Place 18 mcg into inhaler and inhale daily.     Historical Provider, MD  VENTOLIN HFA 108 (90 BASE) MCG/ACT inhaler Inhale 1 puff into the lungs 2 (two) times daily as needed. For shortness of breath/difficulty breathing 12/05/10   Historical Provider, MD     Family History  Problem Relation Age of Onset  . Aneurysm Mother     AAA  . Heart disease Mother     Heart Disease before age 84  . Other Mother     Carotid artery stenosis  . Hyperlipidemia Mother   . Hypertension Mother   . Aneurysm Maternal Aunt     AAA  . Other Maternal Aunt     AAA  . Aneurysm Maternal Uncle     AAA  . Other Maternal Uncle     AAA  . Lupus Sister   . Cancer Sister     Ovarian  . Heart disease Sister   . Heart disease Brother     Heart Disease before age 76  . Hypertension Brother   . Heart attack Brother   . Heart disease Brother     Before age 28  . Heart attack Brother   . Heart attack Brother   . Hyperlipidemia Father   . Hypertension Father     Social History   Social History  . Marital Status: Married    Spouse Name: reba  . Number of Children: 2  . Years of Education: 9   Occupational History  . disability    Social History Main Topics  . Smoking status: Former Smoker -- 2.00 packs/day for 30 years    Types: Cigarettes     Quit date: 06/10/2000  . Smokeless tobacco: Never Used  . Alcohol Use: Yes     Comment: social  . Drug Use: No  . Sexual Activity: No   Other Topics  Concern  . Not on file   Social History Narrative   Patient lives at home with his wife.     Review of Systems: A 12 point ROS discussed and pertinent positives are indicated in the HPI above.  All other systems are negative.  Review of Systems  Vital Signs: BP 156/79 mmHg  Pulse 64  Temp(Src) 98.4 F (36.9 C) (Oral)  Resp 15  Ht 5\' 11"  (1.803 m)  Wt 220 lb (99.791 kg)  BMI 30.70 kg/m2  SpO2 96%  Physical Exam  Constitutional: He is oriented to person, place, and time. He appears well-developed and well-nourished. No distress.  HENT:  Head: Normocephalic and atraumatic.  Eyes: No scleral icterus.  Cardiovascular: Normal rate and regular rhythm.   Pulmonary/Chest: Effort normal.  Abdominal: He exhibits no distension. There is no tenderness.  Neurological: He is alert and oriented to person, place, and time.  Skin: Skin is warm and dry.  Psychiatric: He has a normal mood and affect. His behavior is normal.  Nursing note and vitals reviewed.   Mallampati Score:  2 Imaging: Ct Angio Abd/pel W/ And/or W/o  03/07/2015   CLINICAL DATA:  Aortic aneurysm post stent placement 4-5 years ago, continued increase in size  EXAM: CT ANGIOGRAPHY ABDOMEN AND PELVIS  TECHNIQUE: Multidetector CT imaging of the abdomen and pelvis was performed using the standard protocol during bolus administration of intravenous contrast. Multiplanar reconstructed images including MIPs were obtained and reviewed to evaluate the vascular anatomy.  CONTRAST:  152mL OMNIPAQUE IOHEXOL 350 MG/ML SOLN  COMPARISON:  COMPARISON 02/04/2013 and earlier studies  FINDINGS: ARTERIAL FINDINGS:  Aorta: Visualized distal descending thoracic segment unremarkable. Mild scattered calcified plaque in the suprarenal segment. Patent bifurcated infrarenal stent graft. Type 2  endoleak related to patent lumbar vessels, more conspicuous than on prior study. Native sac diameter 6.2 x 5.7 cm, previously 5.5 cm. No evidence of leak or impending rupture.  Celiac axis:          Patent  Superior mesenteric:  Patent, classic distal branch anatomy.  Left renal:           Duplicated, codominant, patent  Right renal: Single, with partially calcified nonocclusive ostial plaque, patent distally.  Inferior mesenteric: The proximal segment does not initially enhance with arterial phase timing, but on venous phase there is evidence some a retrograde flow into the proximal IMA suggesting it may contribute to the type 2 endoleak.  Left iliac: Right limb scratch the left limb of graft patent, extending to distal common iliac. Native internal and external iliac arteries have some scattered calcified nonocclusive plaque, no aneurysm or stenosis.  Right iliac: Right limb of stent graft extends to the distal common iliac. Native internal and external iliac arteries have scattered calcified plaque, no aneurysm or stenosis.  Venous findings: Patent hepatic veins, portal vein, superior mesenteric vein, splenic vein, bilateral renal veins, IVC, and common iliac veins.  Review of the MIP images confirms the above findings.  Nonvascular findings: Previous median sternotomy. Coronary calcifications. Visualized lung bases clear. Negative liver, spleen, adrenal glands, pancreas, kidneys. Stomach, small bowel, and colon are nondilated. Normal appendix. Urinary bladder incompletely distended. Mild prostatic enlargement with central calcification. No ascites. No free air. No adenopathy. Lumbar spine intact.  IMPRESSION: 1. Patent infrarenal bifurcated stent graft with type 2 endoleak involving a lumbar artery and possibly IMA, with increase in native sac diameter from 5.5 to 6.2 cm since 02/04/2013.   Electronically Signed   By: Lucrezia Europe  M.D.   On: 03/07/2015 16:05    Labs:  CBC:  Recent Labs  03/10/15 0634    HGB 16.0  HCT 47.0    COAGS: No results for input(s): INR, APTT in the last 8760 hours.  BMP:  Recent Labs  02/16/15 1019 03/10/15 0634  NA  --  140  K  --  3.8  CL  --  103  GLUCOSE  --  173*  BUN  --  17  CREATININE 1.06 0.90    LIVER FUNCTION TESTS: No results for input(s): BILITOT, AST, ALT, ALKPHOS, PROT, ALBUMIN in the last 8760 hours.  TUMOR MARKERS: No results for input(s): AFPTM, CEA, CA199, CHROMGRNA in the last 8760 hours.  Assessment and Plan:  Very pleasant 59 year old gentleman with a persistent type II endoleak status post endovascular aortic repair with bifurcated endograft. His excluded aneurysm sac demonstrates small but definite enlargement over the past 2 years.  He is a candidate for attempted direct sac puncture repair of this endoleak. The risks, benefits and alternatives were explained in detail to both he and his wife. They voiced their understanding and desire to proceed.  1.) Schedule for direct sac puncture repair of type II endoleak to be performed with general anesthesia at [facility]. We will likely use Onyx liquid embolic for this procedure.   Thank you for this interesting consult.  I greatly enjoyed meeting KENAI FLUEGEL and look forward to participating in their care.  A copy of this report was sent to the requesting provider on this date.  SignedJacqulynn Cadet 03/14/2015, 4:13 PM   I spent a total of 40 Minutes in face to face in clinical consultation, greater than 50% of which was counseling/coordinating care for type II endoleak with enlarging aneurysm.

## 2015-03-16 DIAGNOSIS — M7501 Adhesive capsulitis of right shoulder: Secondary | ICD-10-CM | POA: Diagnosis not present

## 2015-03-17 ENCOUNTER — Other Ambulatory Visit (HOSPITAL_COMMUNITY): Payer: Self-pay | Admitting: Interventional Radiology

## 2015-03-17 DIAGNOSIS — IMO0002 Reserved for concepts with insufficient information to code with codable children: Secondary | ICD-10-CM

## 2015-03-17 DIAGNOSIS — T82330A Leakage of aortic (bifurcation) graft (replacement), initial encounter: Secondary | ICD-10-CM

## 2015-03-21 DIAGNOSIS — M7501 Adhesive capsulitis of right shoulder: Secondary | ICD-10-CM | POA: Diagnosis not present

## 2015-03-23 DIAGNOSIS — M7501 Adhesive capsulitis of right shoulder: Secondary | ICD-10-CM | POA: Diagnosis not present

## 2015-03-28 DIAGNOSIS — M7501 Adhesive capsulitis of right shoulder: Secondary | ICD-10-CM | POA: Diagnosis not present

## 2015-03-30 DIAGNOSIS — M7501 Adhesive capsulitis of right shoulder: Secondary | ICD-10-CM | POA: Diagnosis not present

## 2015-04-03 DIAGNOSIS — M7501 Adhesive capsulitis of right shoulder: Secondary | ICD-10-CM | POA: Diagnosis not present

## 2015-04-03 DIAGNOSIS — D519 Vitamin B12 deficiency anemia, unspecified: Secondary | ICD-10-CM | POA: Diagnosis not present

## 2015-04-03 DIAGNOSIS — Z125 Encounter for screening for malignant neoplasm of prostate: Secondary | ICD-10-CM | POA: Diagnosis not present

## 2015-04-03 DIAGNOSIS — E785 Hyperlipidemia, unspecified: Secondary | ICD-10-CM | POA: Diagnosis not present

## 2015-04-03 DIAGNOSIS — E1151 Type 2 diabetes mellitus with diabetic peripheral angiopathy without gangrene: Secondary | ICD-10-CM | POA: Diagnosis not present

## 2015-04-03 DIAGNOSIS — M19011 Primary osteoarthritis, right shoulder: Secondary | ICD-10-CM | POA: Diagnosis not present

## 2015-04-03 DIAGNOSIS — I10 Essential (primary) hypertension: Secondary | ICD-10-CM | POA: Diagnosis not present

## 2015-04-03 DIAGNOSIS — I2510 Atherosclerotic heart disease of native coronary artery without angina pectoris: Secondary | ICD-10-CM | POA: Diagnosis not present

## 2015-04-03 DIAGNOSIS — E559 Vitamin D deficiency, unspecified: Secondary | ICD-10-CM | POA: Diagnosis not present

## 2015-04-04 ENCOUNTER — Encounter (HOSPITAL_COMMUNITY): Payer: Self-pay

## 2015-04-04 ENCOUNTER — Encounter (HOSPITAL_COMMUNITY)
Admission: RE | Admit: 2015-04-04 | Discharge: 2015-04-04 | Disposition: A | Discharge status: Home / Self Care | Payer: Commercial Managed Care - HMO | Source: Ambulatory Visit | Attending: Interventional Radiology | Admitting: Interventional Radiology

## 2015-04-04 ENCOUNTER — Other Ambulatory Visit: Payer: Self-pay | Admitting: Radiology

## 2015-04-04 DIAGNOSIS — Z01812 Encounter for preprocedural laboratory examination: Secondary | ICD-10-CM | POA: Insufficient documentation

## 2015-04-04 DIAGNOSIS — E119 Type 2 diabetes mellitus without complications: Secondary | ICD-10-CM | POA: Diagnosis not present

## 2015-04-04 DIAGNOSIS — Z8673 Personal history of transient ischemic attack (TIA), and cerebral infarction without residual deficits: Secondary | ICD-10-CM | POA: Diagnosis not present

## 2015-04-04 DIAGNOSIS — E785 Hyperlipidemia, unspecified: Secondary | ICD-10-CM | POA: Insufficient documentation

## 2015-04-04 DIAGNOSIS — K219 Gastro-esophageal reflux disease without esophagitis: Secondary | ICD-10-CM | POA: Insufficient documentation

## 2015-04-04 DIAGNOSIS — I252 Old myocardial infarction: Secondary | ICD-10-CM | POA: Insufficient documentation

## 2015-04-04 DIAGNOSIS — Z7902 Long term (current) use of antithrombotics/antiplatelets: Secondary | ICD-10-CM | POA: Diagnosis not present

## 2015-04-04 DIAGNOSIS — R001 Bradycardia, unspecified: Secondary | ICD-10-CM | POA: Insufficient documentation

## 2015-04-04 DIAGNOSIS — Z7982 Long term (current) use of aspirin: Secondary | ICD-10-CM | POA: Insufficient documentation

## 2015-04-04 DIAGNOSIS — Z87891 Personal history of nicotine dependence: Secondary | ICD-10-CM | POA: Diagnosis not present

## 2015-04-04 DIAGNOSIS — Z951 Presence of aortocoronary bypass graft: Secondary | ICD-10-CM | POA: Insufficient documentation

## 2015-04-04 DIAGNOSIS — I10 Essential (primary) hypertension: Secondary | ICD-10-CM | POA: Insufficient documentation

## 2015-04-04 DIAGNOSIS — Z01818 Encounter for other preprocedural examination: Secondary | ICD-10-CM | POA: Diagnosis not present

## 2015-04-04 DIAGNOSIS — I2510 Atherosclerotic heart disease of native coronary artery without angina pectoris: Secondary | ICD-10-CM | POA: Insufficient documentation

## 2015-04-04 DIAGNOSIS — Z7984 Long term (current) use of oral hypoglycemic drugs: Secondary | ICD-10-CM | POA: Insufficient documentation

## 2015-04-04 DIAGNOSIS — R413 Other amnesia: Secondary | ICD-10-CM | POA: Diagnosis not present

## 2015-04-04 DIAGNOSIS — Z79899 Other long term (current) drug therapy: Secondary | ICD-10-CM | POA: Insufficient documentation

## 2015-04-04 HISTORY — DX: Gastro-esophageal reflux disease without esophagitis: K21.9

## 2015-04-04 HISTORY — DX: Benign prostatic hyperplasia without lower urinary tract symptoms: N40.0

## 2015-04-04 HISTORY — DX: Restless legs syndrome: G25.81

## 2015-04-04 HISTORY — DX: Other amnesia: R41.3

## 2015-04-04 HISTORY — DX: Snoring: R06.83

## 2015-04-04 LAB — PROTIME-INR
INR: 1.19 (ref 0.00–1.49)
PROTHROMBIN TIME: 15.2 s (ref 11.6–15.2)

## 2015-04-04 LAB — GLUCOSE, CAPILLARY: Glucose-Capillary: 137 mg/dL — ABNORMAL HIGH (ref 65–99)

## 2015-04-04 LAB — APTT: aPTT: 29 seconds (ref 24–37)

## 2015-04-04 NOTE — Progress Notes (Signed)
PCP is Christian Mccall at [facility]. Patient informed Nurse that he had lab work drawn at his PCP's office on yesterday, and that he would be returning to have a physical done. Nurse called PCP's office inquiring about blood work, and office staff informed Nurse that patient had a CMET, CBC, Lipid, PSA, Vitamin D, and A1C drawn on yesterday. No coags were drawn. Will request lab results from PCP and have INR/APTT drawn today as ordered by. Fax # at [facility] is [phone].  Cardiologist is Christian Mccall. Patient stated that he has several stents placed in this heart (> than 4) and that he had a CABG done in 2002. Patient stated that he has chest pain all the time, and that it is not anything new for him. Patient denied having any current chest pain or discomfort. Patient stated his last office visit with Christian Mccall was last year, and his next appointment is scheduled for December 2016. When Nurse inquired about Plavix, patient and his wife stated that he was instructed to stop taking Plavix and aspirin on Thursday October 27th (5 days before surgery).   Nurse inquired about blood glucose and patient stated the highest his blood sugar has been was 280 and the lowest was 30. His last A1C was 8.0 per patient. CBG on arrival to PAT was 137. Patients wife stated all patient had for breakfast was one cup of coffee.   According to patients wife, patient has had two sleep studies, but does not have sleep apnea. Patient's wife stated "his snoring is so loud that I don't even sleep in the same room with him anymore."  Will send chart to Anesthesia for review.

## 2015-04-04 NOTE — Pre-Procedure Instructions (Signed)
Christian Mccall  04/04/2015     Your procedure is scheduled on : Wednesday April 12, 2015 at 1:00 PM.  Report to [facility] Admitting at 11:00 AM.  Call this number if you have problems the morning of surgery: [phone]    Remember:  Do not eat food or drink liquids after midnight.  Take these medicines the morning of surgery with A SIP OF WATER : Albuterol inhaler if needed, Clonazepam (Klonopin), Advair inhaler, Lansoprazole (Prevacid), Metoprolol (Lopressor), Pregabalin (Lyrica), Spiriva inhaler, Ranolazine (Ranexa)   Please follow your Physicians instructions regarding Plavix. Please stop taking Plavix 5 days before surgery if OK with your Cardiologist   Stop taking any vitamins, herbal medications, Ibuprofen, Advil, Motrin, Aleve, etc on Wednesday October 26th   Do NOT take any diabetic pills the morning of your surgery (NO Metformin/Glucophage, Jardiance)   How to Manage Your Diabetes Before Surgery   Why is it important to control my blood sugar before and after surgery?   Improving blood sugar levels before and after surgery helps healing and can limit problems.  A way of improving blood sugar control is eating a healthy diet by:  - Eating less sugar and carbohydrates  - Increasing activity/exercise  - Talk with your doctor about reaching your blood sugar goals  High blood sugars (greater than 180 mg/dL) can raise your risk of infections and slow down your recovery so you will need to focus on controlling your diabetes during the weeks before surgery.  Make sure that the doctor who takes care of your diabetes knows about your planned surgery including the date and location.  How do I manage my blood sugars before surgery?   Check your blood sugar at least 4 times a day, 2 days before surgery to make sure that they are not too high or low.   Check your blood sugar the morning of your surgery when you wake up and every 2 hours until you get to the  Short-Stay unit.  If your blood sugar is less than 70 mg/dL, you will need to treat for low blood sugar by:  Treat a low blood sugar (less than 70 mg/dL) with 1/2 cup of clear juice (cranberry or apple), 4 glucose tablets, OR glucose gel.  Recheck blood sugar in 15 minutes after treatment (to make sure it is greater than 70 mg/dL).  If blood sugar is not greater than 70 mg/dL on re-check, call [phone] for further instructions.   Report your blood sugar to the Short-Stay nurse when you get to Short-Stay.  References:  [facility], 2007 "How to Manage your Diabetes Before and After Surgery".  What do I do about my diabetes medications?   Do not take oral diabetes medicines (pills) the morning of surgery.   Do not wear jewelry.  Do not wear lotions, powders, or cologne.   Men may shave face and neck.  Do not bring valuables to the hospital.  [facility] is not responsible for any belongings or valuables.  Contacts, dentures or bridgework may not be worn into surgery.  Leave your suitcase in the car.  After surgery it may be brought to your room.  For patients admitted to the hospital, discharge time will be determined by your treatment team.  Patients discharged the day of surgery will not be allowed to drive home.   Name and phone number of your driver:    Special instructions:  Shower using CHG soap the night before  and the morning of your surgery  Please read over the following fact sheets that you were given. Pain Booklet, Coughing and Deep Breathing and Surgical Site Infection Prevention

## 2015-04-05 ENCOUNTER — Telehealth: Payer: Self-pay | Admitting: Interventional Cardiology

## 2015-04-05 NOTE — Progress Notes (Addendum)
Anesthesia Chart Review:  Pt is 59 year old male scheduled for embolization of AAA type II endoleak on 04/12/2015 with Dr. Laurence Ferrari.   PCP is Dr. Shon Baton. Cardiologist is Dr. Daneen Schick, last office visit 04/14/14; next appt is 05/26/15.   PMH includes: CAD (MI 2002, s/p CABG 2002), angina, HTN, DM, hyperlipidemia, stroke (2014, 2015), AAA (s/p EVAR 2012), memory loss, GERD. Former smoker. BMI 30.5.  Medications include: albuterol, ASA, plavix, empagliflozin, vytorin, fenofibrate, advair, lasix, glimepiride, prevacid, losartan, metformin, metoprolol, mirapex, lyrica, ranolazine, terazosin, spiriva. Pt to stop plavix and ASA 04/06/15.   Preoperative labs reviewed.  PT/PTT WNL. Other labs obtained by PCP 04/03/15 and results are on paper chart. Glucose 173, hgbA1c 6.9. Platelets 96. Otherwise CBC and CMET WNL.   EKG 04/04/2015: Sinus bradycardia (56 bpm).   Carotid duplex US 02/10/2014:  R CCA disease present of less than 50% mid segment. B ICA stenosis in the <40% range.   Nuclear stress test 05/03/2013:  1. Mild matched attenuation involving the inferior wall without associated wall motion abnormality. No definitive scintigraphic evidence of prior infarction or pharmacologically induced ischemia. 2. Normal wall motion. Ejection fraction - 59%.  Cardiac cath 08/11/2008:  - Coronary angiography.  a. Left main coronary: Widely patent.  b. Left anterior descending coronary: Totally occluded in the  midvessel.  c. Circumflex artery: 99% OM #1 but this vessel is grafted.  It supplies collaterals to the distal right coronary.  d. Right coronary: Totally occluded. - Bypass graft angiography.  a. Saphenous vein graft to the diagonal #1: Widely patent.  b. Saphenous vein graft to the OM #1: Diffuse disease with up  to 60% proximal stenosis but patent.  c. Saphenous vein graft to RCA: Totally occluded. - LIMA to  LAD: Widely patent  Dr. Thompson Caul last note indicates pt had stable angina. Pt reported to PAT RN he frequently gets chest pain, it is "not anything new for him," denied current symptoms.   Reviewed pt's hx, report of angina with Dr. Swiger Robert. Last office visit with cardiology a year ago. Pt will need cardiac eval prior to surgery. Notified Vickie in Dr. Katrinka Blazing office.   Willeen Cass, FNP-BC [facility]/Anesthesiology Phone: [phone] 04/05/2015 4:09 PM  Addendum: Patient was seen by Truitt Merle, NP on 04/10/15 for pre-operative evaluation. Assessment includes: "Pre op clearance - has multiple risk factors with chronic symptoms but he is at his baseline with no recent change - discussed with Dr. Mare Ferrari (DOD) - he is felt to be at increased risk but ok to proceed from our standpoint. Would like to restart his Aspirin and Plavix as soon as deemed safe from IR."   George Hugh [facility] Stay [facility]/Anesthesiology Phone [phone] 04/10/2015 5:15 PM

## 2015-04-05 NOTE — Telephone Encounter (Signed)
Error

## 2015-04-06 DIAGNOSIS — M7501 Adhesive capsulitis of right shoulder: Secondary | ICD-10-CM | POA: Diagnosis not present

## 2015-04-07 ENCOUNTER — Telehealth: Payer: Self-pay | Admitting: Interventional Cardiology

## 2015-04-07 NOTE — Telephone Encounter (Signed)
Pt scheduled to see Kathrene Alu 04/10/15 8AM, pt aware of appt time.

## 2015-04-07 NOTE — Telephone Encounter (Signed)
New Message  RN from [place] calling to follow up on Surgical Clearance for this pt. Please call back and discuss

## 2015-04-07 NOTE — Telephone Encounter (Signed)
-----   Message from Belva Crome, MD sent at 04/05/2015  3:40 PM EDT ----- Needs cardiac clearance for upcoming Endo-leak repair. Please get in ASAP ----- Message -----    From: Reece Levy, RN    Sent: 04/05/2015   2:55 PM      To: Marcelyn Bruins, Belva Crome, MD  Hi Dr Tamala Julian,  This patient is scheduled for a Type 2 Endoleak repair with Anesthesia on Wednesday, April 12, 2015 at 1pm at [facility] IR with Dr Jacqulynn Cadet.     Levada Dy, NP with [facility] just contacted our office and states that the patient will require cardiac clearance prior to the Endoleak Repair.    Please let us know if you are able to provide cardiac clearance or if the patient will require an appointment for this.    If the patient does not require an appointment, please place a note in Epic.  Thanks for your help! Henri, RN  [phone] (phone #)  [phone] (fax #)  [place] that all is well with you!!  :-)

## 2015-04-10 ENCOUNTER — Ambulatory Visit (INDEPENDENT_AMBULATORY_CARE_PROVIDER_SITE_OTHER): Payer: Commercial Managed Care - HMO | Admitting: Nurse Practitioner

## 2015-04-10 ENCOUNTER — Encounter: Payer: Self-pay | Admitting: Nurse Practitioner

## 2015-04-10 VITALS — BP 118/68 | HR 62 | Ht 71.0 in | Wt 217.0 lb

## 2015-04-10 DIAGNOSIS — I714 Abdominal aortic aneurysm, without rupture, unspecified: Secondary | ICD-10-CM

## 2015-04-10 DIAGNOSIS — Z0000 Encounter for general adult medical examination without abnormal findings: Secondary | ICD-10-CM | POA: Diagnosis not present

## 2015-04-10 DIAGNOSIS — K469 Unspecified abdominal hernia without obstruction or gangrene: Secondary | ICD-10-CM | POA: Diagnosis not present

## 2015-04-10 DIAGNOSIS — G4752 REM sleep behavior disorder: Secondary | ICD-10-CM | POA: Diagnosis not present

## 2015-04-10 DIAGNOSIS — I2510 Atherosclerotic heart disease of native coronary artery without angina pectoris: Secondary | ICD-10-CM

## 2015-04-10 DIAGNOSIS — R413 Other amnesia: Secondary | ICD-10-CM | POA: Diagnosis not present

## 2015-04-10 DIAGNOSIS — I739 Peripheral vascular disease, unspecified: Secondary | ICD-10-CM | POA: Diagnosis not present

## 2015-04-10 DIAGNOSIS — I10 Essential (primary) hypertension: Secondary | ICD-10-CM

## 2015-04-10 DIAGNOSIS — I6529 Occlusion and stenosis of unspecified carotid artery: Secondary | ICD-10-CM | POA: Diagnosis not present

## 2015-04-10 DIAGNOSIS — A530 Latent syphilis, unspecified as early or late: Secondary | ICD-10-CM | POA: Diagnosis not present

## 2015-04-10 DIAGNOSIS — I119 Hypertensive heart disease without heart failure: Secondary | ICD-10-CM | POA: Diagnosis not present

## 2015-04-10 NOTE — Progress Notes (Signed)
CARDIOLOGY OFFICE NOTE  Date:  04/10/2015    Christian Mccall Date of Birth: 1955/12/02 Medical Record #[id number]  PCP:  Precious Reel, MD  Cardiologist:  Tamala Julian    Chief Complaint  Patient presents with  . Pre-op Exam    Pre op visit - seen for Dr. Tamala Julian    History of Present Illness: Christian Mccall is a 59 y.o. male who presents today for a pre op clearance. Seen for Dr. Tamala Julian. He has a history of CAD with prior MI with remote stents and remote CABG from 2002 by Dr. Prescott Gum, HTN, HLD, prior stroke and COPD. He had EVAR back in 2012. Former smoker. He is on chronic Plavix and aspirin. He is disabled and does not work outside the home.   Last seen in November of 2015.   Presented last month to [facility] for follow up - noted to have increase in native sac size with type II endoleak most likely from a lumbar artery. Dr. Oneida Alar was not able to identify exactly which branch the endoleak was coming from and thus the procedure was aborted and the patient has been referred to IR with plans for embolization with anesthesia later this week.   Comes in today. Here with his wife. He notes that he has been doing about the same. He has chronic chest pain and shortness of breath - but he remains at his baseline. Not dizzy or lightheaded. No syncope. He is fine as long as he moves at his own pace. No regular exercise but tries to stay active. No NTG use. He is already off of Plavix and aspirin since Saturday. Tolerating his medicines. Has had chronic back issues. Overall, he feels like he is at his baseline and doing well.  EKG from last week was reviewed and is stable.   Past Medical History  Diagnosis Date  . Hypertension   . Hyperlipidemia   . Myocardial infarction ([place]) 2002  . COPD (chronic obstructive pulmonary disease) ([place])   . AAA (abdominal aortic aneurysm) ([place])   . Neuropathy ([place])   . CAD (coronary artery disease)   . Diabetes mellitus 2009  . Arthritis   . Joint pain   . Leg  pain     with walking and pain in feet while lying flat  . Chest pain   . Shortness of breath 12/06/10    while lying flat and with exertion  . Reflux   . Stroke [facility]) June 2014    Mini  pt. had several small strokes  . Stroke [facility]) Sept. 2015    mini strokes=  at least 10 in last 2 mos.  [name] GERD (gastroesophageal reflux disease)   . Enlarged prostate   . Restless leg syndrome   . Memory loss     Due to hardening of the arteries in the brain  . Snoring     Past Surgical History  Procedure Laterality Date  . Abdominal aortic aneurysm repair  01-16-11    EVAR  . Abdominal aortic aneurysm repair    . Peripheral vascular catheterization N/A 03/10/2015    Procedure: Abdominal Aortogram;  Surgeon: Elam Dutch, MD;  Location: [facility];  Service: Cardiovascular;  Laterality: N/A;  . Cardiac catheterization    . Coronary angioplasty      > 4 sents  . Colonoscopy    . Coronary artery bypass graft  2002     Medications: Current Outpatient Prescriptions  Medication Sig Dispense  Refill  . albuterol (PROVENTIL HFA;VENTOLIN HFA) 108 (90 BASE) MCG/ACT inhaler Inhale 2 puffs into the lungs daily as needed for wheezing or shortness of breath.    [name] aspirin 325 MG tablet Take 325 mg by mouth daily.      . cholecalciferol (VITAMIN D) 1000 UNITS tablet Take 1,000 Units by mouth daily.    . clonazePAM (KLONOPIN) 1 MG tablet Take 1 tablet (1 mg total) by mouth 2 (two) times daily. 60 tablet 5  . clopidogrel (PLAVIX) 75 MG tablet Take 75 mg by mouth daily.      . cyanocobalamin (,VITAMIN B-12,) 1000 MCG/ML injection Inject 1,000 mcg into the muscle See admin instructions. Every 2 months at Dr. Keane Police office (next injection is due 04/10/15)    . empagliflozin (JARDIANCE) 10 MG TABS tablet Take 10 mg by mouth daily.    [name] escitalopram (LEXAPRO) 20 MG tablet Take 20 mg by mouth at bedtime.    [name] ezetimibe-simvastatin (VYTORIN) 10-40 MG per tablet Take 1 tablet by mouth daily.     .  fenofibrate 160 MG tablet Take 160 mg by mouth daily.     . Fluticasone-Salmeterol (ADVAIR) 100-50 MCG/DOSE AEPB Inhale 1 puff into the lungs daily.    . furosemide (LASIX) 40 MG tablet Take 40 mg by mouth every other day.     [name] glimepiride (AMARYL) 4 MG tablet Take 4 mg by mouth at bedtime.     . lansoprazole (PREVACID) 30 MG capsule Take 30 mg by mouth daily.   3  . losartan (COZAAR) 50 MG tablet Take 50 mg by mouth daily.      . metFORMIN (GLUCOPHAGE) 1000 MG tablet Take 1,000 mg by mouth 2 (two) times daily. Breakfast and at bedtime    . metoprolol (LOPRESSOR) 50 MG tablet Take 50 mg by mouth 2 (two) times daily.     . nitroGLYCERIN (NITROSTAT) 0.4 MG SL tablet Place 0.4 mg under the tongue every 5 (five) minutes as needed for chest pain.    . pramipexole (MIRAPEX) 0.25 MG tablet Take 0.25 mg by mouth at bedtime.     . pregabalin (LYRICA) 100 MG capsule Take 100-200 mg by mouth 2 (two) times daily. Take 1 tablet (100 mg) by mouth every morning and 2 tablets (200 mg) at bedtime    . ranolazine (RANEXA) 1000 MG SR tablet Take 1,000 mg by mouth 2 (two) times daily.      [name] terazosin (HYTRIN) 1 MG capsule Take 1 mg by mouth daily.     [name] tiotropium (SPIRIVA) 18 MCG inhalation capsule Place 18 mcg into inhaler and inhale daily.      No current facility-administered medications for this visit.    Allergies: No Known Allergies  Social History: The patient  reports that he quit smoking about 14 years ago. His smoking use included Cigarettes. He has a 60 pack-year smoking history. He has never used smokeless tobacco. He reports that he drinks alcohol. He reports that he does not use illicit drugs.   Family History: The patient's family history includes Aneurysm in his maternal aunt, maternal uncle, and mother; Cancer in his sister; Heart attack in his brother, brother, and brother; Heart disease in his brother, brother, mother, and sister; Hyperlipidemia in his father and mother; Hypertension in  his brother, father, and mother; Lupus in his sister; Other in his maternal aunt, maternal uncle, and mother.   Review of Systems: Please see the history of present illness.   Otherwise, the review  of systems is positive for chronic chest pain, DOE, back pain, muscle pain, balance issues, and snoring.   All other systems are reviewed and negative.   Physical Exam: VS:  BP 118/68 mmHg  Pulse 62  Ht 5\' 11"  (1.803 m)  Wt 217 lb (98.431 kg)  BMI 30.28 kg/m2 .  BMI Body mass index is 30.28 kg/(m^2).  Wt Readings from Last 3 Encounters:  04/10/15 217 lb (98.431 kg)  04/04/15 218 lb 9.6 oz (99.156 kg)  03/14/15 220 lb (99.791 kg)    General: Pleasant. Well developed, well nourished and in no acute distress.  HEENT: Normal. Neck: Supple, no JVD, carotid bruits, or masses noted.  Cardiac: Regular rate and rhythm. No murmurs, rubs, or gallops. No edema.  Respiratory:  Lungs are clear to auscultation bilaterally with normal work of breathing.  GI: Soft and nontender.  MS: No deformity or atrophy. Gait and ROM intact. Skin: Warm and dry. Color is normal.  Neuro:  Strength and sensation are intact and no gross focal deficits noted.  Psych: Alert, appropriate and with normal affect.   LABORATORY DATA:  EKG:  EKG is not ordered today.  EKG from last week reviewed and was stable - sinus brady.   Lab Results  Component Value Date   WBC 6.7 08/12/2013   HGB 16.0 03/10/2015   HCT 47.0 03/10/2015   PLT 82* 08/12/2013   GLUCOSE 173* 03/10/2015   CHOL 129 05/02/2013   TRIG 230* 05/02/2013   HDL 69 05/02/2013   LDLCALC 14 05/02/2013   ALT 32 05/05/2013   AST 33 05/05/2013   NA 140 03/10/2015   K 3.8 03/10/2015   CL 103 03/10/2015   CREATININE 0.90 03/10/2015   BUN 17 03/10/2015   CO2 22 05/05/2013   TSH 2.080 11/16/2013   INR 1.19 04/04/2015   HGBA1C 7.9* 05/01/2013    BNP (last 3 results) No results for input(s): BNP in the last 8760 hours.  ProBNP (last 3 results) No  results for input(s): PROBNP in the last 8760 hours.   Other Studies Reviewed Today:   Assessment/Plan: 1. Pre op clearance - has multiple risk factors with chronic symptoms but he is at his baseline with no recent change - discussed with Dr. Mare Ferrari (DOD) - he is felt to be at increased risk but ok to proceed from our standpoint. Would like to restart his Aspirin and Plavix as soon as deemed safe from IR  2. CAD with remote CABG/PCI - stable symptoms  3. HTN - BP good on current regimen.   4. HLD - labs by PCP  5. EVAR - with leak - need for embolization   6. DM  Current medicines are reviewed with the patient today.  The patient does not have concerns regarding medicines other than what has been noted above.  The following changes have been made:  See above.  Labs/ tests ordered today include:   No orders of the defined types were placed in this encounter.     Disposition:   FU with Dr. Tamala Julian as planned next month - will be available as needed if problems arise in the interim.   Patient is agreeable to this plan and will call if any problems develop in the interim.   Signed: Burtis Junes, RN, ANP-C 04/10/2015 8:18 AM  [facility] [address] [place], [place]  [address] Phone: [phone] Fax: [phone]

## 2015-04-10 NOTE — Patient Instructions (Addendum)
We will be checking the following labs today - NONE   Medication Instructions:    Continue with your current medicines.     Testing/Procedures To Be Arranged:  N/A  Follow-Up:   See Dr. Rimmer as planned.     Other Special Instructions:   N/A    If you need a refill on your cardiac medications before your next appointment, please call your pharmacy.   Call the [facility] office at [phone] if you have any questions, problems or concerns.      

## 2015-04-11 ENCOUNTER — Other Ambulatory Visit: Payer: Self-pay | Admitting: Radiology

## 2015-04-11 ENCOUNTER — Other Ambulatory Visit (HOSPITAL_COMMUNITY): Payer: Self-pay | Admitting: Interventional Radiology

## 2015-04-11 DIAGNOSIS — Z1212 Encounter for screening for malignant neoplasm of rectum: Secondary | ICD-10-CM | POA: Diagnosis not present

## 2015-04-12 ENCOUNTER — Encounter (HOSPITAL_COMMUNITY): Payer: Self-pay

## 2015-04-12 ENCOUNTER — Observation Stay (HOSPITAL_COMMUNITY)
Admission: RE | Admit: 2015-04-12 | Discharge: 2015-04-13 | Disposition: A | Discharge status: Home / Self Care | Payer: Commercial Managed Care - HMO | Source: Ambulatory Visit | Attending: Interventional Radiology | Admitting: Interventional Radiology

## 2015-04-12 ENCOUNTER — Ambulatory Visit (HOSPITAL_COMMUNITY)
Admission: RE | Admit: 2015-04-12 | Discharge: 2015-04-12 | Disposition: A | Discharge status: Home / Self Care | Payer: Commercial Managed Care - HMO | Source: Ambulatory Visit | Attending: Interventional Radiology | Admitting: Interventional Radiology

## 2015-04-12 ENCOUNTER — Ambulatory Visit (HOSPITAL_COMMUNITY): Payer: Commercial Managed Care - HMO | Admitting: Certified Registered Nurse Anesthetist

## 2015-04-12 ENCOUNTER — Encounter (HOSPITAL_COMMUNITY): Payer: Self-pay | Admitting: General Practice

## 2015-04-12 ENCOUNTER — Ambulatory Visit (HOSPITAL_COMMUNITY): Payer: Commercial Managed Care - HMO | Admitting: Emergency Medicine

## 2015-04-12 ENCOUNTER — Encounter (HOSPITAL_COMMUNITY)
Admission: RE | Disposition: A | Discharge status: Home / Self Care | Payer: Self-pay | Source: Ambulatory Visit | Attending: Interventional Radiology

## 2015-04-12 DIAGNOSIS — IMO0002 Reserved for concepts with insufficient information to code with codable children: Secondary | ICD-10-CM

## 2015-04-12 DIAGNOSIS — I252 Old myocardial infarction: Secondary | ICD-10-CM | POA: Diagnosis not present

## 2015-04-12 DIAGNOSIS — Z7982 Long term (current) use of aspirin: Secondary | ICD-10-CM | POA: Insufficient documentation

## 2015-04-12 DIAGNOSIS — Z7902 Long term (current) use of antithrombotics/antiplatelets: Secondary | ICD-10-CM | POA: Insufficient documentation

## 2015-04-12 DIAGNOSIS — I2510 Atherosclerotic heart disease of native coronary artery without angina pectoris: Secondary | ICD-10-CM | POA: Insufficient documentation

## 2015-04-12 DIAGNOSIS — Z951 Presence of aortocoronary bypass graft: Secondary | ICD-10-CM | POA: Insufficient documentation

## 2015-04-12 DIAGNOSIS — T82330A Leakage of aortic (bifurcation) graft (replacement), initial encounter: Secondary | ICD-10-CM

## 2015-04-12 DIAGNOSIS — Z8673 Personal history of transient ischemic attack (TIA), and cerebral infarction without residual deficits: Secondary | ICD-10-CM | POA: Diagnosis not present

## 2015-04-12 DIAGNOSIS — E785 Hyperlipidemia, unspecified: Secondary | ICD-10-CM | POA: Insufficient documentation

## 2015-04-12 DIAGNOSIS — J449 Chronic obstructive pulmonary disease, unspecified: Secondary | ICD-10-CM | POA: Insufficient documentation

## 2015-04-12 DIAGNOSIS — M549 Dorsalgia, unspecified: Secondary | ICD-10-CM | POA: Diagnosis not present

## 2015-04-12 DIAGNOSIS — I9789 Other postprocedural complications and disorders of the circulatory system, not elsewhere classified: Secondary | ICD-10-CM | POA: Diagnosis present

## 2015-04-12 DIAGNOSIS — E119 Type 2 diabetes mellitus without complications: Secondary | ICD-10-CM | POA: Insufficient documentation

## 2015-04-12 DIAGNOSIS — Y832 Surgical operation with anastomosis, bypass or graft as the cause of abnormal reaction of the patient, or of later complication, without mention of misadventure at the time of the procedure: Secondary | ICD-10-CM | POA: Insufficient documentation

## 2015-04-12 DIAGNOSIS — Z87891 Personal history of nicotine dependence: Secondary | ICD-10-CM | POA: Insufficient documentation

## 2015-04-12 DIAGNOSIS — I714 Abdominal aortic aneurysm, without rupture, unspecified: Secondary | ICD-10-CM | POA: Insufficient documentation

## 2015-04-12 DIAGNOSIS — I713 Abdominal aortic aneurysm, ruptured: Secondary | ICD-10-CM | POA: Diagnosis not present

## 2015-04-12 DIAGNOSIS — Z7984 Long term (current) use of oral hypoglycemic drugs: Secondary | ICD-10-CM | POA: Insufficient documentation

## 2015-04-12 DIAGNOSIS — I10 Essential (primary) hypertension: Secondary | ICD-10-CM | POA: Diagnosis not present

## 2015-04-12 DIAGNOSIS — Z79899 Other long term (current) drug therapy: Secondary | ICD-10-CM | POA: Insufficient documentation

## 2015-04-12 HISTORY — DX: Other intervertebral disc degeneration, lumbar region: M51.36

## 2015-04-12 HISTORY — DX: Other chronic pain: G89.29

## 2015-04-12 HISTORY — DX: Angina pectoris, unspecified: I20.9

## 2015-04-12 HISTORY — DX: Type 2 diabetes mellitus without complications: E11.9

## 2015-04-12 HISTORY — DX: Other intervertebral disc displacement, lumbar region: M51.26

## 2015-04-12 HISTORY — DX: Headache, unspecified: R51.9

## 2015-04-12 HISTORY — DX: Dorsalgia, unspecified: M54.9

## 2015-04-12 HISTORY — DX: Emphysema, unspecified: J43.9

## 2015-04-12 HISTORY — DX: Headache: R51

## 2015-04-12 HISTORY — DX: Transient cerebral ischemic attack, unspecified: G45.9

## 2015-04-12 HISTORY — PX: ABDOMINAL AORTIC ANEURYSM REPAIR: SUR1152

## 2015-04-12 HISTORY — PX: RADIOLOGY WITH ANESTHESIA: SHX6223

## 2015-04-12 HISTORY — DX: Other intervertebral disc degeneration, lumbar region without mention of lumbar back pain or lower extremity pain: M51.369

## 2015-04-12 HISTORY — DX: Type 2 diabetes mellitus with diabetic polyneuropathy: E11.42

## 2015-04-12 LAB — CBC
HCT: 38.1 % — ABNORMAL LOW (ref 39.0–52.0)
Hemoglobin: 12.9 g/dL — ABNORMAL LOW (ref 13.0–17.0)
MCH: 30.9 pg (ref 26.0–34.0)
MCHC: 33.9 g/dL (ref 30.0–36.0)
MCV: 91.4 fL (ref 78.0–100.0)
PLATELETS: 54 10*3/uL — AB (ref 150–400)
RBC: 4.17 MIL/uL — AB (ref 4.22–5.81)
RDW: 14.2 % (ref 11.5–15.5)
WBC: 4.1 10*3/uL (ref 4.0–10.5)

## 2015-04-12 LAB — GLUCOSE, CAPILLARY
GLUCOSE-CAPILLARY: 196 mg/dL — AB (ref 65–99)
GLUCOSE-CAPILLARY: 139 mg/dL — AB (ref 65–99)

## 2015-04-12 LAB — BASIC METABOLIC PANEL
Anion gap: 10 (ref 5–15)
BUN: 11 mg/dL (ref 6–20)
CALCIUM: 9 mg/dL (ref 8.9–10.3)
CO2: 24 mmol/L (ref 22–32)
CREATININE: 0.77 mg/dL (ref 0.61–1.24)
Chloride: 104 mmol/L (ref 101–111)
GFR calc non Af Amer: >60 mL/min (ref >60)
Glucose, Bld: 190 mg/dL — ABNORMAL HIGH (ref 65–99)
Potassium: 4.1 mmol/L (ref 3.5–5.1)
SODIUM: 138 mmol/L (ref 135–145)

## 2015-04-12 LAB — TYPE AND SCREEN
ABO/RH(D): A NEG
Antibody Screen: NEGATIVE

## 2015-04-12 SURGERY — RADIOLOGY WITH ANESTHESIA
Anesthesia: General

## 2015-04-12 MED ORDER — ROCURONIUM BROMIDE 100 MG/10ML IV SOLN
INTRAVENOUS | Status: DC | PRN
  Administered 2015-04-12: 50 mg via INTRAVENOUS
  Administered 2015-04-12: 10 mg via INTRAVENOUS
  Administered 2015-04-12: 20 mg via INTRAVENOUS

## 2015-04-12 MED ORDER — CLONAZEPAM 1 MG PO TABS
1.0000 mg | ORAL_TABLET | Freq: Two times a day (BID) | ORAL | Status: DC
  Administered 2015-04-12 – 2015-04-13 (×2): 1 mg via ORAL
  Filled 2015-04-12 (×2): qty 1

## 2015-04-12 MED ORDER — MIDAZOLAM HCL 5 MG/5ML IJ SOLN
INTRAMUSCULAR | Status: DC | PRN
  Administered 2015-04-12: 2 mg via INTRAVENOUS

## 2015-04-12 MED ORDER — LOSARTAN POTASSIUM 50 MG PO TABS
50.0000 mg | ORAL_TABLET | Freq: Every day | ORAL | Status: DC
  Administered 2015-04-12 – 2015-04-13 (×2): 50 mg via ORAL
  Filled 2015-04-12 (×2): qty 1

## 2015-04-12 MED ORDER — FUROSEMIDE 40 MG PO TABS
40.0000 mg | ORAL_TABLET | ORAL | Status: DC
  Filled 2015-04-12: qty 1

## 2015-04-12 MED ORDER — TIOTROPIUM BROMIDE MONOHYDRATE 18 MCG IN CAPS
18.0000 ug | ORAL_CAPSULE | Freq: Every day | RESPIRATORY_TRACT | Status: DC
  Administered 2015-04-13: 18 ug via RESPIRATORY_TRACT
  Filled 2015-04-12: qty 5

## 2015-04-12 MED ORDER — ESCITALOPRAM OXALATE 20 MG PO TABS
20.0000 mg | ORAL_TABLET | Freq: Every day | ORAL | Status: DC
  Administered 2015-04-12: 20 mg via ORAL
  Filled 2015-04-12: qty 1

## 2015-04-12 MED ORDER — LACTATED RINGERS IV SOLN
INTRAVENOUS | Status: DC
  Administered 2015-04-12 (×2): via INTRAVENOUS

## 2015-04-12 MED ORDER — GLIMEPIRIDE 4 MG PO TABS
4.0000 mg | ORAL_TABLET | Freq: Every day | ORAL | Status: DC
  Administered 2015-04-12: 4 mg via ORAL
  Filled 2015-04-12 (×2): qty 1

## 2015-04-12 MED ORDER — CEFAZOLIN SODIUM-DEXTROSE 2-3 GM-% IV SOLR
2.0000 g | INTRAVENOUS | Status: AC
  Administered 2015-04-12: 2 g via INTRAVENOUS

## 2015-04-12 MED ORDER — PREGABALIN 75 MG PO CAPS
200.0000 mg | ORAL_CAPSULE | Freq: Every day | ORAL | Status: DC
  Administered 2015-04-12: 200 mg via ORAL
  Filled 2015-04-12: qty 2
  Filled 2015-04-12: qty 1

## 2015-04-12 MED ORDER — SENNOSIDES-DOCUSATE SODIUM 8.6-50 MG PO TABS: 1.0000 | ORAL_TABLET | Freq: Every day | ORAL | Status: DC | PRN

## 2015-04-12 MED ORDER — NEOSTIGMINE METHYLSULFATE 10 MG/10ML IV SOLN
INTRAVENOUS | Status: DC | PRN
  Administered 2015-04-12: 5 mg via INTRAVENOUS

## 2015-04-12 MED ORDER — SODIUM CHLORIDE 0.9 % IV SOLN: Freq: Once | INTRAVENOUS | Status: DC

## 2015-04-12 MED ORDER — PRAMIPEXOLE DIHYDROCHLORIDE 0.25 MG PO TABS
0.2500 mg | ORAL_TABLET | Freq: Every day | ORAL | Status: DC
  Administered 2015-04-12: 0.25 mg via ORAL
  Filled 2015-04-12 (×2): qty 1

## 2015-04-12 MED ORDER — EMPAGLIFLOZIN 10 MG PO TABS: 10.0000 mg | ORAL_TABLET | Freq: Every day | ORAL | Status: DC

## 2015-04-12 MED ORDER — PREGABALIN 50 MG PO CAPS: 100.0000 mg | ORAL_CAPSULE | Freq: Two times a day (BID) | ORAL | Status: DC

## 2015-04-12 MED ORDER — DOCUSATE SODIUM 100 MG PO CAPS
100.0000 mg | ORAL_CAPSULE | Freq: Two times a day (BID) | ORAL | Status: DC
  Administered 2015-04-12 – 2015-04-13 (×2): 100 mg via ORAL
  Filled 2015-04-12 (×2): qty 1

## 2015-04-12 MED ORDER — RANOLAZINE ER 500 MG PO TB12
1000.0000 mg | ORAL_TABLET | Freq: Two times a day (BID) | ORAL | Status: DC
  Administered 2015-04-12 – 2015-04-13 (×2): 1000 mg via ORAL
  Filled 2015-04-12 (×3): qty 2

## 2015-04-12 MED ORDER — PROPOFOL 10 MG/ML IV BOLUS
INTRAVENOUS | Status: DC | PRN
  Administered 2015-04-12: 90 mg via INTRAVENOUS
  Administered 2015-04-12: 30 mg via INTRAVENOUS

## 2015-04-12 MED ORDER — EPHEDRINE SULFATE 50 MG/ML IJ SOLN
INTRAMUSCULAR | Status: DC | PRN
  Administered 2015-04-12 (×2): 5 mg via INTRAVENOUS

## 2015-04-12 MED ORDER — PREGABALIN 50 MG PO CAPS
100.0000 mg | ORAL_CAPSULE | Freq: Every day | ORAL | Status: DC
  Administered 2015-04-13: 100 mg via ORAL
  Filled 2015-04-12: qty 2

## 2015-04-12 MED ORDER — METOPROLOL TARTRATE 50 MG PO TABS
50.0000 mg | ORAL_TABLET | Freq: Two times a day (BID) | ORAL | Status: DC
  Administered 2015-04-12 – 2015-04-13 (×2): 50 mg via ORAL
  Filled 2015-04-12 (×2): qty 1

## 2015-04-12 MED ORDER — EZETIMIBE-SIMVASTATIN 10-40 MG PO TABS
1.0000 | ORAL_TABLET | Freq: Every day | ORAL | Status: DC
  Administered 2015-04-12 – 2015-04-13 (×2): 1 via ORAL
  Filled 2015-04-12 (×2): qty 1

## 2015-04-12 MED ORDER — METFORMIN HCL 500 MG PO TABS: 1000.0000 mg | ORAL_TABLET | Freq: Two times a day (BID) | ORAL | Status: DC

## 2015-04-12 MED ORDER — LIDOCAINE HCL (CARDIAC) 20 MG/ML IV SOLN
INTRAVENOUS | Status: DC | PRN
  Administered 2015-04-12: 80 mg via INTRAVENOUS

## 2015-04-12 MED ORDER — ALBUTEROL SULFATE (2.5 MG/3ML) 0.083% IN NEBU: 3.0000 mL | INHALATION_SOLUTION | Freq: Every day | RESPIRATORY_TRACT | Status: DC | PRN

## 2015-04-12 MED ORDER — PANTOPRAZOLE SODIUM 40 MG PO TBEC
40.0000 mg | DELAYED_RELEASE_TABLET | Freq: Every day | ORAL | Status: DC
  Administered 2015-04-12 – 2015-04-13 (×2): 40 mg via ORAL
  Filled 2015-04-12 (×2): qty 1

## 2015-04-12 MED ORDER — TERAZOSIN HCL 1 MG PO CAPS
1.0000 mg | ORAL_CAPSULE | Freq: Every day | ORAL | Status: DC
  Administered 2015-04-12 – 2015-04-13 (×2): 1 mg via ORAL
  Filled 2015-04-12 (×2): qty 1

## 2015-04-12 MED ORDER — GELATIN ABSORBABLE 12-7 MM EX MISC
CUTANEOUS | Status: AC
  Filled 2015-04-12: qty 1

## 2015-04-12 MED ORDER — IOHEXOL 300 MG/ML  SOLN
150.0000 mL | Freq: Once | INTRAMUSCULAR | Status: DC | PRN
  Administered 2015-04-12: 30 mL via INTRAVENOUS
  Filled 2015-04-12: qty 150

## 2015-04-12 MED ORDER — HYDROCODONE-ACETAMINOPHEN 5-325 MG PO TABS
1.0000 | ORAL_TABLET | ORAL | Status: DC | PRN
  Administered 2015-04-13: 2 via ORAL
  Filled 2015-04-12: qty 2

## 2015-04-12 MED ORDER — ONDANSETRON HCL 4 MG/2ML IJ SOLN: 4.0000 mg | Freq: Four times a day (QID) | INTRAMUSCULAR | Status: DC | PRN

## 2015-04-12 MED ORDER — GLYCOPYRROLATE 0.2 MG/ML IJ SOLN
INTRAMUSCULAR | Status: DC | PRN
  Administered 2015-04-12: .8 mg via INTRAVENOUS

## 2015-04-12 MED ORDER — NITROGLYCERIN 0.4 MG SL SUBL: 0.4000 mg | SUBLINGUAL_TABLET | SUBLINGUAL | Status: DC | PRN

## 2015-04-12 MED ORDER — SODIUM CHLORIDE 0.9 % IV SOLN: INTRAVENOUS | Status: AC

## 2015-04-12 MED ORDER — MOMETASONE FURO-FORMOTEROL FUM 100-5 MCG/ACT IN AERO
2.0000 | INHALATION_SPRAY | Freq: Two times a day (BID) | RESPIRATORY_TRACT | Status: DC
  Administered 2015-04-13: 2 via RESPIRATORY_TRACT
  Filled 2015-04-12: qty 8.8

## 2015-04-12 MED ORDER — FENTANYL CITRATE (PF) 100 MCG/2ML IJ SOLN
INTRAMUSCULAR | Status: DC | PRN
  Administered 2015-04-12: 50 ug via INTRAVENOUS
  Administered 2015-04-12: 100 ug via INTRAVENOUS

## 2015-04-12 MED ORDER — FENOFIBRATE 160 MG PO TABS
160.0000 mg | ORAL_TABLET | Freq: Every day | ORAL | Status: DC
  Administered 2015-04-12 – 2015-04-13 (×2): 160 mg via ORAL
  Filled 2015-04-12 (×2): qty 1

## 2015-04-12 MED ORDER — ONDANSETRON HCL 4 MG/2ML IJ SOLN
INTRAMUSCULAR | Status: DC | PRN
  Administered 2015-04-12: 4 mg via INTRAVENOUS

## 2015-04-12 NOTE — Anesthesia Preprocedure Evaluation (Signed)
Anesthesia Evaluation  Patient identified by MRN, date of birth, ID band Patient awake    Reviewed: Allergy & Precautions, NPO status , Patient's Chart, lab work & pertinent test results  Airway Mallampati: II       Dental   Pulmonary shortness of breath, COPD, former smoker,    breath sounds clear to auscultation       Cardiovascular hypertension, + CAD, + Past MI and + Peripheral Vascular Disease   Rhythm:Regular  Aortic disease   Neuro/Psych CVA    GI/Hepatic GERD  ,  Endo/Other  diabetes  Renal/GU      Musculoskeletal  (+) Arthritis ,   Abdominal (+) + obese,   Peds  Hematology   Anesthesia Other Findings   Reproductive/Obstetrics                             Anesthesia Physical Anesthesia Plan  ASA: IV  Anesthesia Plan: General   Post-op Pain Management:    Induction:   Airway Management Planned: Oral ETT  Additional Equipment: Arterial line  Intra-op Plan:   Post-operative Plan: Extubation in OR  Informed Consent:   Dental advisory given  Plan Discussed with:   Anesthesia Plan Comments:         Anesthesia Quick Evaluation

## 2015-04-12 NOTE — Transfer of Care (Signed)
Immediate Anesthesia Transfer of Care Note  Patient: Christian Mccall  Procedure(s) Performed: Procedure(s): embolization (N/A)  Patient Location: PACU  Anesthesia Type:General  Level of Consciousness: awake  Airway & Oxygen Therapy: Patient Spontanous Breathing and Patient connected to face mask oxygen  Post-op Assessment: Report given to RN, Post -op Vital signs reviewed and stable, Patient moving all extremities X 4 and Patient able to stick tongue midline  Post vital signs: stable  Last Vitals:  Filed Vitals:   04/12/15 1111  BP: 128/67  Pulse: 53  Temp: 36.4 C  Resp: 18    Complications: No apparent anesthesia complications

## 2015-04-12 NOTE — H&P (Signed)
Chief Complaint: Patient was seen in consultation today for AAA endoleak type II embolization at the request of Dr Ruta Hinds  Referring Physician(s): Dr Ruta Hinds  History of Present Illness: Christian Mccall is a 59 y.o. male   Christian Mccall is a 59 y.o. male with a history of abdominal aortic aneurysm status post endovascular aortic repair in August 2012. He has a known type II endoleak which has been followed. His aneurysm demonstrates definitive enlargement from 5.5 cm to up to 6.2 cm between August 2014 and September 2016.  CT 03/07/15: IMPRESSION: 1. Patent infrarenal bifurcated stent graft with type 2 endoleak involving a lumbar artery and possibly IMA, with increase in native sac diameter from 5.5 to 6.2 cm since 02/04/2013.  Referred to Dr Laurence Ferrari and consulted 03/14/15 regarding endoleak embolization using direct sac puncture repair Now scheduled for same   Past Medical History  Diagnosis Date  . Hypertension   . Hyperlipidemia   . Myocardial infarction ([place]) 2002  . COPD (chronic obstructive pulmonary disease) ([place])   . AAA (abdominal aortic aneurysm) ([place])   . Neuropathy ([place])   . CAD (coronary artery disease)   . Diabetes mellitus 2009  . Arthritis   . Joint pain   . Leg pain     with walking and pain in feet while lying flat  . Chest pain   . Shortness of breath 12/06/10    while lying flat and with exertion  . Reflux   . Stroke [facility]) June 2014    Mini  pt. had several small strokes  . Stroke [facility]) Sept. 2015    mini strokes=  at least 10 in last 2 mos.  [name] GERD (gastroesophageal reflux disease)   . Enlarged prostate   . Restless leg syndrome   . Memory loss     Due to hardening of the arteries in the brain  . Snoring     Past Surgical History  Procedure Laterality Date  . Abdominal aortic aneurysm repair  01-16-11    EVAR  . Abdominal aortic aneurysm repair    . Peripheral vascular catheterization N/A 03/10/2015    Procedure:  Abdominal Aortogram;  Surgeon: Elam Dutch, MD;  Location: [place] [facility];  Service: Cardiovascular;  Laterality: N/A;  . Cardiac catheterization    . Coronary angioplasty      > 4 sents  . Colonoscopy    . Coronary artery bypass graft  2002    Allergies: Review of patient's allergies indicates no known allergies.  Medications: Prior to Admission medications   Medication Sig Start Date End Date Taking? Authorizing Provider  albuterol (PROVENTIL HFA;VENTOLIN HFA) 108 (90 BASE) MCG/ACT inhaler Inhale 2 puffs into the lungs daily as needed for wheezing or shortness of breath.    Historical Provider, MD  aspirin 325 MG tablet Take 325 mg by mouth daily.      Historical Provider, MD  cholecalciferol (VITAMIN D) 1000 UNITS tablet Take 1,000 Units by mouth daily.    Historical Provider, MD  clonazePAM (KLONOPIN) 1 MG tablet Take 1 tablet (1 mg total) by mouth 2 (two) times daily. 07/15/14   Larey Seat, MD  clopidogrel (PLAVIX) 75 MG tablet Take 75 mg by mouth daily.      Historical Provider, MD  cyanocobalamin (,VITAMIN B-12,) 1000 MCG/ML injection Inject 1,000 mcg into the muscle See admin instructions. Every 2 months at Dr. Keane Police office (next injection is due 04/10/15)    Historical Provider, MD  empagliflozin (JARDIANCE) 10 MG TABS tablet Take 10 mg by mouth daily.    Historical Provider, MD  escitalopram (LEXAPRO) 20 MG tablet Take 20 mg by mouth at bedtime.    Historical Provider, MD  ezetimibe-simvastatin (VYTORIN) 10-40 MG per tablet Take 1 tablet by mouth daily.     Historical Provider, MD  fenofibrate 160 MG tablet Take 160 mg by mouth daily.  11/03/10   Historical Provider, MD  Fluticasone-Salmeterol (ADVAIR) 100-50 MCG/DOSE AEPB Inhale 1 puff into the lungs daily.    Historical Provider, MD  furosemide (LASIX) 40 MG tablet Take 40 mg by mouth every other day.     Historical Provider, MD  glimepiride (AMARYL) 4 MG tablet Take 4 mg by mouth at bedtime.     Historical  Provider, MD  lansoprazole (PREVACID) 30 MG capsule Take 30 mg by mouth daily.  03/11/14   Historical Provider, MD  losartan (COZAAR) 50 MG tablet Take 50 mg by mouth daily.      Historical Provider, MD  metFORMIN (GLUCOPHAGE) 1000 MG tablet Take 1,000 mg by mouth 2 (two) times daily. Breakfast and at bedtime    Historical Provider, MD  metoprolol (LOPRESSOR) 50 MG tablet Take 50 mg by mouth 2 (two) times daily.     Historical Provider, MD  nitroGLYCERIN (NITROSTAT) 0.4 MG SL tablet Place 0.4 mg under the tongue every 5 (five) minutes as needed for chest pain.    Historical Provider, MD  pramipexole (MIRAPEX) 0.25 MG tablet Take 0.25 mg by mouth at bedtime.  03/24/12   Historical Provider, MD  pregabalin (LYRICA) 100 MG capsule Take 100-200 mg by mouth 2 (two) times daily. Take 1 tablet (100 mg) by mouth every morning and 2 tablets (200 mg) at bedtime    Historical Provider, MD  ranolazine (RANEXA) 1000 MG SR tablet Take 1,000 mg by mouth 2 (two) times daily.      Historical Provider, MD  terazosin (HYTRIN) 1 MG capsule Take 1 mg by mouth daily.     Historical Provider, MD  tiotropium (SPIRIVA) 18 MCG inhalation capsule Place 18 mcg into inhaler and inhale daily.     Historical Provider, MD     Family History  Problem Relation Age of Onset  . Aneurysm Mother     AAA  . Heart disease Mother     Heart Disease before age 71  . Other Mother     Carotid artery stenosis  . Hyperlipidemia Mother   . Hypertension Mother   . Aneurysm Maternal Aunt     AAA  . Other Maternal Aunt     AAA  . Aneurysm Maternal Uncle     AAA  . Other Maternal Uncle     AAA  . Lupus Sister   . Cancer Sister     Ovarian  . Heart disease Sister   . Heart disease Brother     Heart Disease before age 71  . Hypertension Brother   . Heart attack Brother   . Heart disease Brother     Before age 48  . Heart attack Brother   . Heart attack Brother   . Hyperlipidemia Father   . Hypertension Father     Social  History   Social History  . Marital Status: Married    Spouse Name: Christian Mccall  . Number of Children: 2  . Years of Education: 9   Occupational History  . disability    Social History Main Topics  . Smoking status: Former Smoker --  2.00 packs/day for 30 years    Types: Cigarettes    Quit date: 06/10/2000  . Smokeless tobacco: Never Used  . Alcohol Use: Yes     Comment: social  . Drug Use: No  . Sexual Activity: No   Other Topics Concern  . None   Social History Narrative   Patient lives at home with his wife.      Review of Systems: A 12 point ROS discussed and pertinent positives are indicated in the HPI above.  All other systems are negative.  Review of Systems  Constitutional: Negative for fever, activity change and appetite change.  Respiratory: Negative for cough, chest tightness and shortness of breath.   Cardiovascular: Negative for chest pain.  Gastrointestinal: Negative for abdominal pain.  Genitourinary: Negative for difficulty urinating.  Musculoskeletal: Negative for back pain.  Neurological: Negative for weakness.  Psychiatric/Behavioral: Negative for behavioral problems and confusion.    Vital Signs: There were no vitals taken for this visit.  Physical Exam  Constitutional: He is oriented to person, place, and time. He appears well-nourished.  Cardiovascular: Normal rate, regular rhythm and normal heart sounds.   Pulmonary/Chest: Effort normal and breath sounds normal. He has no wheezes.  Abdominal: Soft. Bowel sounds are normal. There is no tenderness.  Musculoskeletal: Normal range of motion.  Neurological: He is alert and oriented to person, place, and time.  Skin: Skin is warm and dry.  Psychiatric: He has a normal mood and affect. His behavior is normal. Judgment and thought content normal.  Nursing note and vitals reviewed.   Mallampati Score:  MD Evaluation Airway: WNL Heart: WNL Abdomen: WNL Chest/ Lungs: WNL ASA  Classification:  3 Mallampati/Airway Score: One  Imaging: No results found.  Labs:  CBC:  Recent Labs  03/10/15 0634  HGB 16.0  HCT 47.0    COAGS:  Recent Labs  04/04/15 1108  INR 1.19  APTT 29    BMP:  Recent Labs  02/16/15 1019 03/10/15 0634  NA  --  140  K  --  3.8  CL  --  103  GLUCOSE  --  173*  BUN  --  17  CREATININE 1.06 0.90    LIVER FUNCTION TESTS: No results for input(s): BILITOT, AST, ALT, ALKPHOS, PROT, ALBUMIN in the last 8760 hours.  TUMOR MARKERS: No results for input(s): AFPTM, CEA, CA199, CHROMGRNA in the last 8760 hours.  Assessment and Plan:  Persistent type II endoleak status post endovascular aortic repair with bifurcated endograft. His excluded aneurysm sac demonstrates small but definite enlargement over the past 2 years.  He is a candidate for attempted direct sac puncture repair of this endoleak. The risks, benefits and alternatives were explained in detail to both he and his wife. They voiced their understanding and desire to proceed.  Risks and Benefits discussed with the patient including, but not limited to bleeding, infection, vascular injury or contrast induced renal failure; death All of the patient's questions were answered, patient is agreeable to proceed. Consent signed and in chart.  Thank you for this interesting consult.  I greatly enjoyed meeting Christian Mccall and look forward to participating in their care.  A copy of this report was sent to the requesting provider on this date.  Signed: Shulamit Donofrio A 04/12/2015, 12:05 PM   I spent a total of  30 Minutes   in face to face in clinical consultation, greater than 50% of which was counseling/coordinating care for AAA type II endoleak embolization

## 2015-04-12 NOTE — Anesthesia Procedure Notes (Signed)
Procedure Name: Intubation Date/Time: 04/12/2015 2:36 PM Performed by: Rejeana Brock L Pre-anesthesia Checklist: Patient identified, Emergency Drugs available, Suction available, Patient being monitored and Timeout performed Patient Re-evaluated:Patient Re-evaluated prior to inductionOxygen Delivery Method: Circle system utilized Preoxygenation: Pre-oxygenation with 100% oxygen Intubation Type: IV induction Ventilation: Mask ventilation without difficulty Laryngoscope Size: Mac and 4 Grade View: Grade I Tube type: Oral Tube size: 7.5 mm Number of attempts: 1 Airway Equipment and Method: Stylet Placement Confirmation: ETT inserted through vocal cords under direct vision,  positive ETCO2 and breath sounds checked- equal and bilateral Secured at: 22 cm Tube secured with: Tape Dental Injury: Teeth and Oropharynx as per pre-operative assessment

## 2015-04-12 NOTE — Procedures (Signed)
Interventional Radiology Procedure Note  Procedure: Direct sac puncture with liquid embolic repair of Type II endoleak  Puncture Site: Left posterior flank  Complications: None  Estimated Blood Loss: 0 mL  Recommendations: - Bedrest x 6 hrs - Admit for obs - Anticipate DC in am - Clinic f/u in 2-4 weeks - Imaging F/U in 6 months with CTA   Signed,  Criselda Peaches, MD

## 2015-04-13 ENCOUNTER — Encounter (HOSPITAL_COMMUNITY): Payer: Self-pay | Admitting: Interventional Radiology

## 2015-04-13 DIAGNOSIS — I10 Essential (primary) hypertension: Secondary | ICD-10-CM | POA: Diagnosis not present

## 2015-04-13 DIAGNOSIS — I714 Abdominal aortic aneurysm, without rupture, unspecified: Secondary | ICD-10-CM | POA: Insufficient documentation

## 2015-04-13 DIAGNOSIS — I252 Old myocardial infarction: Secondary | ICD-10-CM | POA: Diagnosis not present

## 2015-04-13 DIAGNOSIS — E785 Hyperlipidemia, unspecified: Secondary | ICD-10-CM | POA: Diagnosis not present

## 2015-04-13 DIAGNOSIS — Z951 Presence of aortocoronary bypass graft: Secondary | ICD-10-CM | POA: Diagnosis not present

## 2015-04-13 DIAGNOSIS — I2510 Atherosclerotic heart disease of native coronary artery without angina pectoris: Secondary | ICD-10-CM | POA: Diagnosis not present

## 2015-04-13 DIAGNOSIS — E119 Type 2 diabetes mellitus without complications: Secondary | ICD-10-CM | POA: Diagnosis not present

## 2015-04-13 DIAGNOSIS — Z8673 Personal history of transient ischemic attack (TIA), and cerebral infarction without residual deficits: Secondary | ICD-10-CM | POA: Diagnosis not present

## 2015-04-13 DIAGNOSIS — J449 Chronic obstructive pulmonary disease, unspecified: Secondary | ICD-10-CM | POA: Diagnosis not present

## 2015-04-13 DIAGNOSIS — T82330A Leakage of aortic (bifurcation) graft (replacement), initial encounter: Secondary | ICD-10-CM | POA: Diagnosis not present

## 2015-04-13 LAB — CBC
HCT: 38.1 % — ABNORMAL LOW (ref 39.0–52.0)
Hemoglobin: 12.9 g/dL — ABNORMAL LOW (ref 13.0–17.0)
MCH: 31.3 pg (ref 26.0–34.0)
MCHC: 33.9 g/dL (ref 30.0–36.0)
MCV: 92.5 fL (ref 78.0–100.0)
Platelets: 84 10*3/uL — ABNORMAL LOW (ref 150–400)
RBC: 4.12 MIL/uL — ABNORMAL LOW (ref 4.22–5.81)
RDW: 14.7 % (ref 11.5–15.5)
WBC: 4.7 10*3/uL (ref 4.0–10.5)

## 2015-04-13 LAB — PREPARE PLATELET PHERESIS: Unit division: 0

## 2015-04-13 NOTE — Progress Notes (Signed)
Discharge patient.Discharge home instruction given, no question verbalized.

## 2015-04-13 NOTE — Discharge Summary (Signed)
Patient ID: MAJED PELLEGRIN MRN: [id number] DOB/AGE: Apr 11, 1956 59 y.o.  Admit date: 04/12/2015 Discharge date: 04/13/2015  Admission Diagnoses: Abdominal aortic aneurysm endoleak type II  Discharge Diagnoses:  Active Problems:   Endoleak of aortic graft [facility])   Discharged Condition: improved  Hospital Course: HUBBARD SELDON is a 59 y.o. male with a history of abdominal aortic aneurysm status post endovascular aortic repair in August 2012. He has a known type II endoleak which has been followed. His aneurysm demonstrates definitive enlargement from 5.5 cm to up to 6.2 cm between August 2014 and September 2016. Procedure of Direct sac puncture with liquid embolic repair of Type II endoleak was performed in Interventional Radiology 11/2 with Dr Laurence Ferrari without complication. Admitted overnight for observation. Overnight stay was uneventful. Pt has done well.  eating well; urinated; passing gas No N/V or pain complaints. Ambulating without assistance. Site is clean and dry. I have seen and examined pt Discussed pt status with Dr Laurence Ferrari Now plan doe discharge to home   Consults: None  Significant Diagnostic Studies: Abdominal aorta arteriogram  Treatments: Direct sac puncture with liquid embolic repair of Type II endoleak  Discharge Exam: Blood pressure 109/49, pulse 57, temperature 98.2 F (36.8 C), temperature source Oral, resp. rate 16, height 5\' 11"  (1.803 m), weight 217 lb (98.43 kg), SpO2 94 %.  PE:  A/O Appropriate [place]: RRR Lungs: CTA Abd: soft; +BS NT no masses Extr: FROM; Good strength and sensation Ambulating without issue UOP: good; yellow  Left back site is NT; clean and dry; no bleeding no hematoma  Results for orders placed or performed during the hospital encounter of 04/12/15  Glucose, capillary  Result Value Ref Range   Glucose-Capillary 196 (H) 65 - 99 mg/dL  Basic metabolic panel  Result Value Ref Range   Sodium 138 135 - 145  mmol/L   Potassium 4.1 3.5 - 5.1 mmol/L   Chloride 104 101 - 111 mmol/L   CO2 24 22 - 32 mmol/L   Glucose, Bld 190 (H) 65 - 99 mg/dL   BUN 11 6 - 20 mg/dL   Creatinine, Ser 0.77 0.61 - 1.24 mg/dL   Calcium 9.0 8.9 - 10.3 mg/dL   GFR calc non Af Amer >60 >60 mL/min   GFR calc Af Amer >60 >60 mL/min   Anion gap 10 5 - 15  CBC  Result Value Ref Range   WBC 4.1 4.0 - 10.5 K/uL   RBC 4.17 (L) 4.22 - 5.81 MIL/uL   Hemoglobin 12.9 (L) 13.0 - 17.0 g/dL   HCT 38.1 (L) 39.0 - 52.0 %   MCV 91.4 78.0 - 100.0 fL   MCH 30.9 26.0 - 34.0 pg   MCHC 33.9 30.0 - 36.0 g/dL   RDW 14.2 11.5 - 15.5 %   Platelets 54 (L) 150 - 400 K/uL  CBC  Result Value Ref Range   WBC 4.7 4.0 - 10.5 K/uL   RBC 4.12 (L) 4.22 - 5.81 MIL/uL   Hemoglobin 12.9 (L) 13.0 - 17.0 g/dL   HCT 38.1 (L) 39.0 - 52.0 %   MCV 92.5 78.0 - 100.0 fL   MCH 31.3 26.0 - 34.0 pg   MCHC 33.9 30.0 - 36.0 g/dL   RDW 14.7 11.5 - 15.5 %   Platelets 84 (L) 150 - 400 K/uL  Type and screen [place]  Result Value Ref Range   ABO/RH(D) A NEG    Antibody Screen NEG    Sample  Expiration 04/15/2015   Prepare Pheresed Platelets  Result Value Ref Range   Unit Number [id number]    Blood Component Type PLTP LR2 PAS    Unit division 00    Status of Unit ISSUED,FINAL    Transfusion Status OK TO TRANSFUSE     Disposition: AAA endoleak type 2 liquid embolic repair in IR 41/9 with Dr Laurence Ferrari For discharge now I have seen and examined pt and discussed pt status with dr Laurence Ferrari Pt has good understanding of dc instructions Follow up with Dr Laurence Ferrari in 2-4 weeks Continue all home meds Continue shoulder rehab  Discharge Instructions    Call MD for:  difficulty breathing, headache or visual disturbances    Complete by:  As directed      Call MD for:  extreme fatigue    Complete by:  As directed      Call MD for:  hives    Complete by:  As directed      Call MD for:  persistant dizziness or light-headedness     Complete by:  As directed      Call MD for:  persistant nausea and vomiting    Complete by:  As directed      Call MD for:  redness, tenderness, or signs of infection (pain, swelling, redness, odor or green/yellow discharge around incision site)    Complete by:  As directed      Call MD for:  severe uncontrolled pain    Complete by:  As directed      Call MD for:  temperature >100.4    Complete by:  As directed      Diet - low sodium heart healthy    Complete by:  As directed      Discharge instructions    Complete by:  As directed   Resume all home meds;MAY RESUME PHYSICAL THERAPY FOR RT SHOULDER WITHOUT RESTRICTIONS IMMEDIATELY; follow up with Dr Laurence Ferrari in 2-4 weeks---pt will hear from scheduler for time and date; call [phone]  With questions or concerns     Discharge wound care:    Complete by:  As directed   May shower tomorrow; replace bandage at Left back site daily with clean bandaid     Driving Restrictions    Complete by:  As directed   NO DRIVING X 3 DAYS     Increase activity slowly    Complete by:  As directed      Lifting restrictions    Complete by:  As directed   No lifting over 10 lbs x 3 days; no vigorous exercise x 3 days            Medication List    TAKE these medications        albuterol 108 (90 BASE) MCG/ACT inhaler  Commonly known as:  PROVENTIL HFA;VENTOLIN HFA  Inhale 2 puffs into the lungs daily as needed for wheezing or shortness of breath.     aspirin 325 MG tablet  Take 325 mg by mouth daily.     cholecalciferol 1000 UNITS tablet  Commonly known as:  VITAMIN D  Take 1,000 Units by mouth daily.     clonazePAM 1 MG tablet  Commonly known as:  KLONOPIN  Take 1 tablet (1 mg total) by mouth 2 (two) times daily.     clopidogrel 75 MG tablet  Commonly known as:  PLAVIX  Take 75 mg by mouth daily.     cyanocobalamin 1000 MCG/ML injection  Commonly known  as:  (VITAMIN B-12)  Inject 1,000 mcg into the muscle See admin instructions.  Every 2 months at Dr. Keane Police office (next injection is due 04/10/15)     escitalopram 20 MG tablet  Commonly known as:  LEXAPRO  Take 20 mg by mouth at bedtime.     ezetimibe-simvastatin 10-40 MG tablet  Commonly known as:  VYTORIN  Take 1 tablet by mouth daily.     fenofibrate 160 MG tablet  Take 160 mg by mouth daily.     Fluticasone-Salmeterol 100-50 MCG/DOSE Aepb  Commonly known as:  ADVAIR  Inhale 1 puff into the lungs daily.     furosemide 40 MG tablet  Commonly known as:  LASIX  Take 40 mg by mouth every other day.     glimepiride 4 MG tablet  Commonly known as:  AMARYL  Take 4 mg by mouth at bedtime.     JARDIANCE 10 MG Tabs tablet  Generic drug:  empagliflozin  Take 10 mg by mouth daily.     lansoprazole 30 MG capsule  Commonly known as:  PREVACID  Take 30 mg by mouth daily.     losartan 50 MG tablet  Commonly known as:  COZAAR  Take 50 mg by mouth daily.     metFORMIN 1000 MG tablet  Commonly known as:  GLUCOPHAGE  Take 1,000 mg by mouth 2 (two) times daily. Breakfast and at bedtime     metoprolol 50 MG tablet  Commonly known as:  LOPRESSOR  Take 50 mg by mouth 2 (two) times daily.     nitroGLYCERIN 0.4 MG SL tablet  Commonly known as:  NITROSTAT  Place 0.4 mg under the tongue every 5 (five) minutes as needed for chest pain.     pramipexole 0.25 MG tablet  Commonly known as:  MIRAPEX  Take 0.25 mg by mouth at bedtime.     pregabalin 100 MG capsule  Commonly known as:  LYRICA  Take 100-200 mg by mouth 2 (two) times daily. Take 1 tablet (100 mg) by mouth every morning and 2 tablets (200 mg) at bedtime     RANEXA 1000 MG SR tablet  Generic drug:  ranolazine  Take 1,000 mg by mouth 2 (two) times daily.     terazosin 1 MG capsule  Commonly known as:  HYTRIN  Take 1 mg by mouth daily.     tiotropium 18 MCG inhalation capsule  Commonly known as:  SPIRIVA  Place 18 mcg into inhaler and inhale daily.           Follow-up Information    Follow  up with [facility], [place], MD In 3 weeks.   Specialties:  Interventional Radiology, Radiology   Why:  clinic scheduler will call pt with time and date of follow up appt   Contact information:   [address] [place] [address] [phone]        Signed: Monia Sabal A 04/13/2015, 10:51 AM   I have spent Greater Than 30 Minutes discharging [name].

## 2015-04-14 ENCOUNTER — Other Ambulatory Visit: Payer: Self-pay

## 2015-04-14 ENCOUNTER — Other Ambulatory Visit (HOSPITAL_COMMUNITY): Payer: Self-pay | Admitting: Interventional Radiology

## 2015-04-14 DIAGNOSIS — I714 Abdominal aortic aneurysm, without rupture, unspecified: Secondary | ICD-10-CM

## 2015-04-14 DIAGNOSIS — Z95828 Presence of other vascular implants and grafts: Secondary | ICD-10-CM

## 2015-04-14 DIAGNOSIS — IMO0002 Reserved for concepts with insufficient information to code with codable children: Secondary | ICD-10-CM

## 2015-04-14 DIAGNOSIS — T82330A Leakage of aortic (bifurcation) graft (replacement), initial encounter: Secondary | ICD-10-CM

## 2015-04-14 NOTE — Anesthesia Postprocedure Evaluation (Signed)
  Anesthesia Post-op Note  Patient: Christian Mccall  Procedure(s) Performed: Procedure(s): embolization (N/A)  Patient Location: PACU  Anesthesia Type:General  Level of Consciousness: awake  Airway and Oxygen Therapy: Patient Spontanous Breathing  Post-op Pain: mild  Post-op Assessment: Post-op Vital signs reviewed, Patient's Cardiovascular Status Stable, Respiratory Function Stable, Patent Airway, No signs of Nausea or vomiting and Pain level controlled              Post-op Vital Signs: Reviewed and stable  Last Vitals:  Filed Vitals:   04/13/15 0909  BP: 109/49  Pulse: 57  Temp: 36.8 C  Resp: 16    Complications: No apparent anesthesia complications

## 2015-04-17 ENCOUNTER — Encounter: Payer: Self-pay | Admitting: Interventional Cardiology

## 2015-04-18 DIAGNOSIS — M7501 Adhesive capsulitis of right shoulder: Secondary | ICD-10-CM | POA: Diagnosis not present

## 2015-04-20 DIAGNOSIS — M7501 Adhesive capsulitis of right shoulder: Secondary | ICD-10-CM | POA: Diagnosis not present

## 2015-04-25 DIAGNOSIS — I219 Acute myocardial infarction, unspecified: Secondary | ICD-10-CM

## 2015-04-25 HISTORY — DX: Acute myocardial infarction, unspecified: I21.9

## 2015-04-27 DIAGNOSIS — M7501 Adhesive capsulitis of right shoulder: Secondary | ICD-10-CM | POA: Diagnosis not present

## 2015-05-02 DIAGNOSIS — M7501 Adhesive capsulitis of right shoulder: Secondary | ICD-10-CM | POA: Diagnosis not present

## 2015-05-03 ENCOUNTER — Ambulatory Visit
Admission: RE | Admit: 2015-05-03 | Discharge: 2015-05-03 | Disposition: A | Discharge status: Home / Self Care | Payer: Commercial Managed Care - HMO | Source: Ambulatory Visit | Attending: Radiology | Admitting: Radiology

## 2015-05-03 DIAGNOSIS — I714 Abdominal aortic aneurysm, without rupture, unspecified: Secondary | ICD-10-CM

## 2015-05-03 DIAGNOSIS — IMO0002 Reserved for concepts with insufficient information to code with codable children: Secondary | ICD-10-CM

## 2015-05-03 DIAGNOSIS — T82330A Leakage of aortic (bifurcation) graft (replacement), initial encounter: Secondary | ICD-10-CM | POA: Diagnosis not present

## 2015-05-03 NOTE — Addendum Note (Signed)
Addendum  created 05/03/15 1719 by Oleta Mouse, MD   Modules edited: Anesthesia Events

## 2015-05-03 NOTE — Progress Notes (Signed)
Chief Complaint: Patient was seen in consultation today for post percutaneous repair of type II endoleak at the request of Ruta Hinds  Referring Physician(s): Ruta Hinds  History of Present Illness: Christian Mccall is a 59 y.o. male with a history of abdominal aortic aneurysm status post endovascular aortic repair in August 2012. He has a known type II endoleak which has been followed. His aneurysm demonstrates definitive enlargement from 5.5 cm to up to 6.2 cm between August 2014 and September 2016.  He underwent percutaneous repair of the endoleak via direct sac puncture on 04/12/2015. The procedure demonstrated arterial perfusion of the excluded aneurysm sac secondary to a right-sided lumbar artery. The endoleak was successfully repaired using Onyx liquid embolic.  [name] did very well and his postoperative course. He had minimal discomfort and feels fully recovered. He denies back pain, flank pain, hematuria, or other systemic symptoms.  Past Medical History  Diagnosis Date  . Hypertension   . Hyperlipidemia   . AAA (abdominal aortic aneurysm) ([place])   . CAD (coronary artery disease)   . Leg pain     with walking and pain in feet while lying flat  . Shortness of breath 12/06/10    while lying flat and with exertion  . GERD (gastroesophageal reflux disease)   . Enlarged prostate   . Restless leg syndrome   . Memory loss     Due to hardening of the arteries in the brain  . Snoring   . Anginal pain ([place])   . Myocardial infarction ([place])     "I've had probably 6-7" (04/12/2015)  . COPD (chronic obstructive pulmonary disease) ([place])   . Emphysema lung ([place])   . Type II diabetes mellitus ([place]) dx'd 2009  . Headache     "~ weekly" (04/12/2015)  . TIA (transient ischemic attack) "several"    "since 2015" (04/12/2015)  . Stroke [facility]) 2004    denies residual on 04/12/2015  . Chronic back pain   . Bulging lumbar disc     "& some in my neck"  . Diabetic peripheral  neuropathy [facility])     Past Surgical History  Procedure Laterality Date  . Abdominal aortic aneurysm repair  01-16-11    EVAR  . Peripheral vascular catheterization N/A 03/10/2015    Procedure: Abdominal Aortogram;  Surgeon: Elam Dutch, MD;  Location: [place] [facility];  Service: Cardiovascular;  Laterality: N/A;  . Cardiac catheterization  "several"  . Coronary angioplasty with stent placement  "several"    > 4 sents  . Colonoscopy    . Coronary artery bypass graft  02/18/2001    "CABG x 4"  . Colonoscopy w/ biopsies    . Abdominal aortic aneurysm repair  04/12/2015  . Tonsillectomy  ~ 1965  . Direct sac puncture with liquid embolic repair of type ii endoleak  04/12/2015  . Radiology with anesthesia N/A 04/12/2015    Procedure: embolization;  Surgeon: Jacqulynn Cadet, MD;  Location: [place];  Service: Radiology;  Laterality: N/A;    Allergies: Review of patient's allergies indicates no known allergies.  Medications: Prior to Admission medications   Medication Sig Start Date End Date Taking? Authorizing Provider  albuterol (PROVENTIL HFA;VENTOLIN HFA) 108 (90 BASE) MCG/ACT inhaler Inhale 2 puffs into the lungs daily as needed for wheezing or shortness of breath.   Yes Historical Provider, MD  aspirin 325 MG tablet Take 325 mg by mouth daily.     Yes Historical Provider, MD  cholecalciferol (  VITAMIN D) 1000 UNITS tablet Take 1,000 Units by mouth daily.   Yes Historical Provider, MD  clonazePAM (KLONOPIN) 1 MG tablet Take 1 tablet (1 mg total) by mouth 2 (two) times daily. 07/15/14  Yes Asencion Partridge Dohmeier, MD  clopidogrel (PLAVIX) 75 MG tablet Take 75 mg by mouth daily.     Yes Historical Provider, MD  cyanocobalamin (,VITAMIN B-12,) 1000 MCG/ML injection Inject 1,000 mcg into the muscle See admin instructions. Every 2 months at Dr. [employer] office (next injection is due 04/10/15)   Yes Historical Provider, MD  empagliflozin (JARDIANCE) 10 MG TABS tablet Take 10 mg by mouth daily.   Yes  Historical Provider, MD  escitalopram (LEXAPRO) 20 MG tablet Take 20 mg by mouth at bedtime.   Yes Historical Provider, MD  ezetimibe-simvastatin (VYTORIN) 10-40 MG per tablet Take 1 tablet by mouth daily.    Yes Historical Provider, MD  fenofibrate 160 MG tablet Take 160 mg by mouth daily.  11/03/10  Yes Historical Provider, MD  Fluticasone-Salmeterol (ADVAIR) 100-50 MCG/DOSE AEPB Inhale 1 puff into the lungs daily.   Yes Historical Provider, MD  furosemide (LASIX) 40 MG tablet Take 40 mg by mouth every other day.    Yes Historical Provider, MD  glimepiride (AMARYL) 4 MG tablet Take 4 mg by mouth at bedtime.    Yes Historical Provider, MD  lansoprazole (PREVACID) 30 MG capsule Take 30 mg by mouth daily.  03/11/14  Yes Historical Provider, MD  losartan (COZAAR) 50 MG tablet Take 50 mg by mouth daily.     Yes Historical Provider, MD  metFORMIN (GLUCOPHAGE) 1000 MG tablet Take 1,000 mg by mouth 2 (two) times daily. Breakfast and at bedtime   Yes Historical Provider, MD  metoprolol (LOPRESSOR) 50 MG tablet Take 50 mg by mouth 2 (two) times daily.    Yes Historical Provider, MD  nitroGLYCERIN (NITROSTAT) 0.4 MG SL tablet Place 0.4 mg under the tongue every 5 (five) minutes as needed for chest pain.   Yes Historical Provider, MD  pramipexole (MIRAPEX) 0.25 MG tablet Take 0.25 mg by mouth at bedtime.  03/24/12  Yes Historical Provider, MD  pregabalin (LYRICA) 100 MG capsule Take 100-200 mg by mouth 2 (two) times daily. Take 1 tablet (100 mg) by mouth every morning and 2 tablets (200 mg) at bedtime   Yes Historical Provider, MD  ranolazine (RANEXA) 1000 MG SR tablet Take 1,000 mg by mouth 2 (two) times daily.     Yes Historical Provider, MD  terazosin (HYTRIN) 1 MG capsule Take 1 mg by mouth daily.    Yes Historical Provider, MD  tiotropium (SPIRIVA) 18 MCG inhalation capsule Place 18 mcg into inhaler and inhale daily.    Yes Historical Provider, MD     Family History  Problem Relation Age of Onset  .  Aneurysm Mother     AAA  . Heart disease Mother     Heart Disease before age 47  . Other Mother     Carotid artery stenosis  . Hyperlipidemia Mother   . Hypertension Mother   . Aneurysm Maternal Aunt     AAA  . Other Maternal Aunt     AAA  . Aneurysm Maternal Uncle     AAA  . Other Maternal Uncle     AAA  . Lupus Sister   . Cancer Sister     Ovarian  . Heart disease Sister   . Heart disease Brother     Heart Disease before age 13  .  Hypertension Brother   . Heart attack Brother   . Heart disease Brother     Before age 31  . Heart attack Brother   . Heart attack Brother   . Hyperlipidemia Father   . Hypertension Father     Social History   Social History  . Marital Status: Married    Spouse Name: reba  . Number of Children: 2  . Years of Education: 9   Occupational History  . disability    Social History Main Topics  . Smoking status: Former Smoker -- 2.00 packs/day for 30 years    Types: Cigarettes    Quit date: 02/17/2001  . Smokeless tobacco: Never Used  . Alcohol Use: 1.2 oz/week    2 Cans of beer per week  . Drug Use: No  . Sexual Activity: Not Currently   Other Topics Concern  . None   Social History Narrative   Patient lives at home with his wife.     Review of Systems: A 12 point ROS discussed and pertinent positives are indicated in the HPI above.  All other systems are negative.  Review of Systems  Vital Signs: BP 153/77 mmHg  Pulse 66  Temp(Src) 98.2 F (36.8 C)  Resp 14  SpO2 97%  Physical Exam  Constitutional: He is oriented to person, place, and time. He appears well-developed and well-nourished. No distress.  HENT:  Head: Normocephalic and atraumatic.  Eyes: No scleral icterus.  Cardiovascular: Normal rate.   Pulmonary/Chest: Effort normal.  Abdominal: Soft. He exhibits no distension. There is no tenderness.  Neurological: He is alert and oriented to person, place, and time.  Skin: Skin is warm and dry.  Psychiatric: He  has a normal mood and affect. His behavior is normal.  Nursing note and vitals reviewed.   Imaging: Ir Angiogram Follow Up Study  04/14/2015  CLINICAL DATA:  59 year old male with abdominal aortic aneurysm complicated by a type 2 endoleak. There has been growth of the aneurysm sac over time. Patient presents today for type 2 endoleak repair. EXAM: IR EMBO ARTERIAL NOT HEMORR HEMANG INC GUIDE ROADMAPPING; WORKSTATION 3D RECONSTRUCTION Date: 04/12/2015 PROCEDURE: 1. Direct sac puncture under fluoroscopic guidance 2. Angiography of the aneurysm sac 3. Embolization with Onyx liquid embolic Interventional Radiologist:  Criselda Peaches, MD ANESTHESIA/SEDATION: General anesthesia provided by the anesthesiology service. MEDICATIONS: None additional FLUOROSCOPY TIME:  6 minutes 48 seconds A total of 6,222 mGy CONTRAST:  51mL OMNIPAQUE IOHEXOL 300 MG/ML  SOLN TECHNIQUE: Informed consent was obtained from the patient following explanation of the procedure, risks, benefits and alternatives. The patient understands, agrees and consents for the procedure. All questions were addressed. A time out was performed. Maximal barrier sterile technique utilized including caps, mask, sterile gowns, sterile gloves, large sterile drape, hand hygiene, and Betadine skin prep. Using anatomic landmarks an pre defined angulation, the appropriate skin entry site approximately 12.5 cm left lateral to the spinous process at L3 was localized under fluoroscopy. A small dermatotomy was made following local anesthesia with 1% lidocaine. Using a combination of fluoroscopic guidance an cone beam CT, an 18 gauge trocar needle was advanced from a left posterolateral approach through the flank and into the aneurysm sac targeting the small endoleak flow channel. Once the needle was in position, the trocar was removed demonstrating pulsatile flow through the needle and confirming location. A gentle hand injection of contrast material was then  performed in multiple obliquities. There is a spongiform endoleak cavity. A left-sided L3 lumbar  artery appears to provide the inflow. No definite outflow vessel was identified. Flow is relatively slow allowing for direct embolization with on ex. An echelon 10 micro catheter was advanced coaxially through the 18 gauge trocar needle using a check flow valve for hemostasis at the needle hub. Additional angiography was performed through the echelon micro catheter confirming the flow pattern and opacification of the feeding lumbar artery. Embolization was then performed using a total of 3 vials of a combination of 18 and 34 Onyx liquid embolic. All embolization was performed under real-time fluoroscopy. The Onyx cast was monitored continuously until filling of the origin of the inflow vessel was achieved. COMPLICATIONS: None Estimated blood loss: 0 IMPRESSION: Technically successful liquid embolic embolization of type 2 endoleak using direct sac puncture technique. Signed, Criselda Peaches, MD [facility] Radiology Electronically Signed   By: Jacqulynn Cadet M.D.   On: 04/12/2015 17:14   [place]  04/12/2015  CLINICAL DATA:  59 year old male with abdominal aortic aneurysm complicated by a type 2 endoleak. There has been growth of the aneurysm sac over time. Patient presents today for type 2 endoleak repair. EXAM: IR EMBO ARTERIAL NOT HEMORR HEMANG INC GUIDE ROADMAPPING; WORKSTATION 3D RECONSTRUCTION Date: 04/12/2015 PROCEDURE: 1. Direct sac puncture under fluoroscopic guidance 2. Angiography of the aneurysm sac 3. Embolization with Onyx liquid embolic Interventional Radiologist:  Criselda Peaches, MD ANESTHESIA/SEDATION: General anesthesia provided by the anesthesiology service. MEDICATIONS: None additional FLUOROSCOPY TIME:  6 minutes 48 seconds A total of 6,222 mGy CONTRAST:  92mL OMNIPAQUE IOHEXOL 300 MG/ML  SOLN TECHNIQUE: Informed consent was  obtained from the patient following explanation of the procedure, risks, benefits and alternatives. The patient understands, agrees and consents for the procedure. All questions were addressed. A time out was performed. Maximal barrier sterile technique utilized including caps, mask, sterile gowns, sterile gloves, large sterile drape, hand hygiene, and Betadine skin prep. Using anatomic landmarks an pre defined angulation, the appropriate skin entry site approximately 12.5 cm left lateral to the spinous process at L3 was localized under fluoroscopy. A small dermatotomy was made following local anesthesia with 1% lidocaine. Using a combination of fluoroscopic guidance an cone beam CT, an 18 gauge trocar needle was advanced from a left posterolateral approach through the flank and into the aneurysm sac targeting the small endoleak flow channel. Once the needle was in position, the trocar was removed demonstrating pulsatile flow through the needle and confirming location. A gentle hand injection of contrast material was then performed in multiple obliquities. There is a spongiform endoleak cavity. A left-sided L3 lumbar artery appears to provide the inflow. No definite outflow vessel was identified. Flow is relatively slow allowing for direct embolization with on ex. An echelon 10 micro catheter was advanced coaxially through the 18 gauge trocar needle using a check flow valve for hemostasis at the needle hub. Additional angiography was performed through the echelon micro catheter confirming the flow pattern and opacification of the feeding lumbar artery. Embolization was then performed using a total of 3 vials of a combination of 18 and 34 Onyx liquid embolic. All embolization was performed under real-time fluoroscopy. The Onyx cast was monitored continuously until filling of the origin of the inflow vessel was achieved. COMPLICATIONS: None Estimated blood loss: 0 IMPRESSION: Technically successful liquid embolic  embolization of type 2 endoleak using direct sac puncture technique. Signed, Criselda Peaches, MD [facility] Radiology Electronically Signed   By: Myrle Sheng  Laurence Ferrari M.D.   On: 04/12/2015 17:14   Ir Embo Arterial Not [place]  04/12/2015  CLINICAL DATA:  59 year old male with abdominal aortic aneurysm complicated by a type 2 endoleak. There has been growth of the aneurysm sac over time. Patient presents today for type 2 endoleak repair. EXAM: IR EMBO ARTERIAL NOT HEMORR HEMANG INC GUIDE ROADMAPPING; WORKSTATION 3D RECONSTRUCTION Date: 04/12/2015 PROCEDURE: 1. Direct sac puncture under fluoroscopic guidance 2. Angiography of the aneurysm sac 3. Embolization with Onyx liquid embolic Interventional Radiologist:  Criselda Peaches, MD ANESTHESIA/SEDATION: General anesthesia provided by the anesthesiology service. MEDICATIONS: None additional FLUOROSCOPY TIME:  6 minutes 48 seconds A total of 6,222 mGy CONTRAST:  59mL OMNIPAQUE IOHEXOL 300 MG/ML  SOLN TECHNIQUE: Informed consent was obtained from the patient following explanation of the procedure, risks, benefits and alternatives. The patient understands, agrees and consents for the procedure. All questions were addressed. A time out was performed. Maximal barrier sterile technique utilized including caps, mask, sterile gowns, sterile gloves, large sterile drape, hand hygiene, and Betadine skin prep. Using anatomic landmarks an pre defined angulation, the appropriate skin entry site approximately 12.5 cm left lateral to the spinous process at L3 was localized under fluoroscopy. A small dermatotomy was made following local anesthesia with 1% lidocaine. Using a combination of fluoroscopic guidance an cone beam CT, an 18 gauge trocar needle was advanced from a left posterolateral approach through the flank and into the aneurysm sac targeting the small endoleak flow channel. Once the needle  was in position, the trocar was removed demonstrating pulsatile flow through the needle and confirming location. A gentle hand injection of contrast material was then performed in multiple obliquities. There is a spongiform endoleak cavity. A left-sided L3 lumbar artery appears to provide the inflow. No definite outflow vessel was identified. Flow is relatively slow allowing for direct embolization with on ex. An echelon 10 micro catheter was advanced coaxially through the 18 gauge trocar needle using a check flow valve for hemostasis at the needle hub. Additional angiography was performed through the echelon micro catheter confirming the flow pattern and opacification of the feeding lumbar artery. Embolization was then performed using a total of 3 vials of a combination of 18 and 34 Onyx liquid embolic. All embolization was performed under real-time fluoroscopy. The Onyx cast was monitored continuously until filling of the origin of the inflow vessel was achieved. COMPLICATIONS: None Estimated blood loss: 0 IMPRESSION: Technically successful liquid embolic embolization of type 2 endoleak using direct sac puncture technique. Signed, Criselda Peaches, MD [facility] Radiology Electronically Signed   By: Jacqulynn Cadet M.D.   On: 04/12/2015 17:14    Labs:  CBC:  Recent Labs  03/10/15 0634 04/12/15 1303 04/13/15 0325  WBC  --  4.1 4.7  HGB 16.0 12.9* 12.9*  HCT 47.0 38.1* 38.1*  PLT  --  54* 84*    COAGS:  Recent Labs  04/04/15 1108  INR 1.19  APTT 29    BMP:  Recent Labs  02/16/15 1019 03/10/15 0634 04/12/15 1303  NA  --  140 138  K  --  3.8 4.1  CL  --  103 104  CO2  --   --  24  GLUCOSE  --  173* 190*  BUN  --  17 11  CALCIUM  --   --  9.0  CREATININE 1.06 0.90 0.77  GFRNONAA  --   --  >60  GFRAA  --   --  >  60    LIVER FUNCTION TESTS: No results for input(s): BILITOT, AST, ALT, ALKPHOS, PROT, ALBUMIN in the last  8760 hours.  TUMOR MARKERS: No results for input(s): AFPTM, CEA, CA199, CHROMGRNA in the last 8760 hours.  Assessment and Plan:  Doing very well status post percutaneous endoleak repair via direct sac puncture. His postoperative course has been uncomplicated and he is fully recovered.  He has a follow-up CTA of the abdomen ordered for next week on December 1 and a subsequent clinic visit with Dr. Oneida Alar after that.    1.) Please make a note on the calendar for me to be sure to check the results of his CTA next week.  If no endoleak is evident on that study, his next follow-up CTA should be in 6 months.  2.) As he will be following up closely with Dr. Oneida Alar, I don't want to overburden him with unnecessary clinic visits. I will keep an eye on his follow-up CTA scans and we will call him in for an appointment if it looks like the endoleak repair was not successful.    SignedJacqulynn Cadet 05/03/2015, 4:46 PM   I spent a total of  15 Minutes in face to face in clinical consultation, greater than 50% of which was counseling/coordinating care for type II endoleak.

## 2015-05-11 ENCOUNTER — Ambulatory Visit
Admission: RE | Admit: 2015-05-11 | Discharge: 2015-05-11 | Disposition: A | Discharge status: Home / Self Care | Payer: Commercial Managed Care - HMO | Source: Ambulatory Visit | Attending: Vascular Surgery | Admitting: Vascular Surgery

## 2015-05-11 DIAGNOSIS — I714 Abdominal aortic aneurysm, without rupture, unspecified: Secondary | ICD-10-CM

## 2015-05-11 DIAGNOSIS — Z95828 Presence of other vascular implants and grafts: Secondary | ICD-10-CM

## 2015-05-11 MED ORDER — IOPAMIDOL (ISOVUE-370) INJECTION 76%
75.0000 mL | Freq: Once | INTRAVENOUS | Status: AC | PRN
  Administered 2015-05-11: 75 mL via INTRAVENOUS

## 2015-05-12 ENCOUNTER — Encounter: Payer: Self-pay | Admitting: Vascular Surgery

## 2015-05-18 ENCOUNTER — Encounter: Payer: Self-pay | Admitting: Vascular Surgery

## 2015-05-18 ENCOUNTER — Ambulatory Visit (INDEPENDENT_AMBULATORY_CARE_PROVIDER_SITE_OTHER): Payer: Commercial Managed Care - HMO | Admitting: Vascular Surgery

## 2015-05-18 VITALS — BP 147/73 | HR 55 | Temp 98.0°F | Resp 14 | Ht 71.0 in | Wt 224.0 lb

## 2015-05-18 DIAGNOSIS — I714 Abdominal aortic aneurysm, without rupture, unspecified: Secondary | ICD-10-CM

## 2015-05-18 DIAGNOSIS — Z48812 Encounter for surgical aftercare following surgery on the circulatory system: Secondary | ICD-10-CM | POA: Diagnosis not present

## 2015-05-18 NOTE — Progress Notes (Signed)
      History of Present Illness  Christian Mccall is a 59 y.o. ([birth year]) male who presents for follow up s/p EVAR (Date: 01/2011).   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 He recently underwent chronic skin injection of a persistent type II endoleak from a lumbar artery by interventional radiology.  The patient has not had any new back or abdominal pain. He is a former smoker (quit 15 years ago). He takes plavix and aspirin.   He does still have some persistent right groin pain from his previous arteriograms.   review of systems: He denies chest pain. He denies shortness of breath.   Physical Examination    Filed Vitals:   05/18/15 0954 05/18/15 1000  BP: 156/68 147/73  Pulse: 55 55  Temp: 98 F (36.7 C)   TempSrc: Oral   Resp: 14   Height: 5\' 11"  (1.803 m)   Weight: 224 lb (101.606 kg)   SpO2: 95%    General: A&O x 3, WDWN obese male in NAD   Abdomen: Soft nontender no pulsatile mass   Extremities: 2+ femoral pulses absent pedal pulses bilaterally  Data:    CT angiogram performed on 05/11/2015 is reviewed. Aneurysm diameter is 5.8 cm. The previous type II endoleak is not visualized and the Onyx injection site is visualized    Patient with a type II endoleak most likely from a lumbar artery now appears resolved with Onyx injection.. He has had expansion of his aneurysm.  Hopefully this will now regress now that the endoleak has been treated.   He will follow-up with [place] again in 6 months time.  Ruta Hinds, MD [facility] [place] Office: [phone] Pager: [phone]

## 2015-05-18 NOTE — Progress Notes (Signed)
Filed Vitals:   05/18/15 0954 05/18/15 1000  BP: 156/68 147/73  Pulse: 55 55  Temp: 98 F (36.7 C)   TempSrc: Oral   Resp: 14   Height: 5\' 11"  (1.803 m)   Weight: 224 lb (101.606 kg)   SpO2: 95%

## 2015-05-26 ENCOUNTER — Ambulatory Visit: Payer: 59 | Admitting: Interventional Cardiology

## 2015-05-29 DIAGNOSIS — M7501 Adhesive capsulitis of right shoulder: Secondary | ICD-10-CM | POA: Diagnosis not present

## 2015-05-29 DIAGNOSIS — M19011 Primary osteoarthritis, right shoulder: Secondary | ICD-10-CM | POA: Diagnosis not present

## 2015-06-09 DIAGNOSIS — M25511 Pain in right shoulder: Secondary | ICD-10-CM | POA: Diagnosis not present

## 2015-06-19 ENCOUNTER — Ambulatory Visit: Payer: Commercial Managed Care - HMO | Admitting: Interventional Cardiology

## 2015-06-19 DIAGNOSIS — M7501 Adhesive capsulitis of right shoulder: Secondary | ICD-10-CM | POA: Diagnosis not present

## 2015-06-19 DIAGNOSIS — M7541 Impingement syndrome of right shoulder: Secondary | ICD-10-CM | POA: Diagnosis not present

## 2015-06-27 ENCOUNTER — Emergency Department (HOSPITAL_COMMUNITY)
Admission: EM | Admit: 2015-06-27 | Discharge: 2015-06-27 | Disposition: A | Discharge status: Home / Self Care | Payer: Commercial Managed Care - HMO | Attending: Emergency Medicine | Admitting: Emergency Medicine

## 2015-06-27 ENCOUNTER — Emergency Department (HOSPITAL_COMMUNITY): Payer: Commercial Managed Care - HMO

## 2015-06-27 ENCOUNTER — Encounter (HOSPITAL_COMMUNITY): Payer: Self-pay | Admitting: Emergency Medicine

## 2015-06-27 DIAGNOSIS — N400 Enlarged prostate without lower urinary tract symptoms: Secondary | ICD-10-CM | POA: Insufficient documentation

## 2015-06-27 DIAGNOSIS — Z7902 Long term (current) use of antithrombotics/antiplatelets: Secondary | ICD-10-CM | POA: Insufficient documentation

## 2015-06-27 DIAGNOSIS — Z7984 Long term (current) use of oral hypoglycemic drugs: Secondary | ICD-10-CM | POA: Insufficient documentation

## 2015-06-27 DIAGNOSIS — G2581 Restless legs syndrome: Secondary | ICD-10-CM | POA: Diagnosis not present

## 2015-06-27 DIAGNOSIS — M79605 Pain in left leg: Secondary | ICD-10-CM | POA: Diagnosis not present

## 2015-06-27 DIAGNOSIS — Z79899 Other long term (current) drug therapy: Secondary | ICD-10-CM | POA: Insufficient documentation

## 2015-06-27 DIAGNOSIS — Z7951 Long term (current) use of inhaled steroids: Secondary | ICD-10-CM | POA: Diagnosis not present

## 2015-06-27 DIAGNOSIS — Z9861 Coronary angioplasty status: Secondary | ICD-10-CM | POA: Diagnosis not present

## 2015-06-27 DIAGNOSIS — Z951 Presence of aortocoronary bypass graft: Secondary | ICD-10-CM | POA: Insufficient documentation

## 2015-06-27 DIAGNOSIS — G8929 Other chronic pain: Secondary | ICD-10-CM | POA: Insufficient documentation

## 2015-06-27 DIAGNOSIS — E119 Type 2 diabetes mellitus without complications: Secondary | ICD-10-CM | POA: Diagnosis not present

## 2015-06-27 DIAGNOSIS — I10 Essential (primary) hypertension: Secondary | ICD-10-CM | POA: Insufficient documentation

## 2015-06-27 DIAGNOSIS — E785 Hyperlipidemia, unspecified: Secondary | ICD-10-CM | POA: Insufficient documentation

## 2015-06-27 DIAGNOSIS — I252 Old myocardial infarction: Secondary | ICD-10-CM | POA: Insufficient documentation

## 2015-06-27 DIAGNOSIS — J439 Emphysema, unspecified: Secondary | ICD-10-CM | POA: Insufficient documentation

## 2015-06-27 DIAGNOSIS — K219 Gastro-esophageal reflux disease without esophagitis: Secondary | ICD-10-CM | POA: Diagnosis not present

## 2015-06-27 DIAGNOSIS — M79662 Pain in left lower leg: Secondary | ICD-10-CM | POA: Diagnosis not present

## 2015-06-27 DIAGNOSIS — Z87891 Personal history of nicotine dependence: Secondary | ICD-10-CM | POA: Diagnosis not present

## 2015-06-27 DIAGNOSIS — Z8673 Personal history of transient ischemic attack (TIA), and cerebral infarction without residual deficits: Secondary | ICD-10-CM | POA: Insufficient documentation

## 2015-06-27 DIAGNOSIS — I25119 Atherosclerotic heart disease of native coronary artery with unspecified angina pectoris: Secondary | ICD-10-CM | POA: Diagnosis not present

## 2015-06-27 NOTE — ED Provider Notes (Signed)
CSN: [id number]     Arrival date & time 06/27/15  1157 History  By signing my name below, I, Hilda Lias, attest that this documentation has been prepared under the direction and in the presence of Harlene Ramus, [place].  Electronically Signed: Hilda Lias, ED Scribe. 06/27/2015. 2:14 PM.    Chief Complaint  Patient presents with  . Leg Pain     Patient is a 60 y.o. male presenting with leg pain. The history is provided by the patient. No language interpreter was used.  Leg Pain Associated symptoms: no fever    HPI Comments: Christian Mccall is a 60 y.o. male who presents to the Emergency Department complaining of constant, sharp, unchanged left posterior calf pain with associated numbness that radiates to his toes and to the plantar aspect of his left foot that has been present since a few hours PTA. Pt reports that his pain worsens with standing or ambulation, and reports his pain began when he was on his tip-toes reaching for an object on a shelf above him. Pt denies taking any medication to treat his pain PTA, and states that nothing makes his pain better. Pt denies any recent long travel/immobilization, surgery w/in the past 4 weeks, active cancer, paralysis, swelling or hx of DVT. Denies CP, SOB, cough. Pt states he is taking blood thinners due to an open heart surgery in 2002.    Past Medical History  Diagnosis Date  . Hypertension   . Hyperlipidemia   . AAA (abdominal aortic aneurysm) ([place])   . CAD (coronary artery disease)   . Leg pain     with walking and pain in feet while lying flat  . Shortness of breath 12/06/10    while lying flat and with exertion  . GERD (gastroesophageal reflux disease)   . Enlarged prostate   . Restless leg syndrome   . Memory loss     Due to hardening of the arteries in the brain  . Snoring   . Anginal pain ([place])   . COPD (chronic obstructive pulmonary disease) ([place])   . Emphysema lung ([place])   . Type II diabetes mellitus ([place]) dx'd 2009  .  Headache     "~ weekly" (04/12/2015)  . TIA (transient ischemic attack) "several"    "since 2015" (04/12/2015)  . Stroke [facility]) 2004    denies residual on 04/12/2015  . Chronic back pain   . Bulging lumbar disc     "& some in my neck"  . Diabetic peripheral neuropathy ([place])   . Myocardial infarction ([place])     "I've had probably 6-7" (04/12/2015)  . Heart attack [facility]) Nov. 15,2016    TIA   Past Surgical History  Procedure Laterality Date  . Abdominal aortic aneurysm repair  01-16-11    EVAR  . Peripheral vascular catheterization N/A 03/10/2015    Procedure: Abdominal Aortogram;  Surgeon: Elam Dutch, MD;  Location: [place] [facility];  Service: Cardiovascular;  Laterality: N/A;  . Cardiac catheterization  "several"  . Coronary angioplasty with stent placement  "several"    > 4 sents  . Colonoscopy    . Coronary artery bypass graft  02/18/2001    "CABG x 4"  . Colonoscopy w/ biopsies    . Abdominal aortic aneurysm repair  04/12/2015  . Tonsillectomy  ~ 1965  . Direct sac puncture with liquid embolic repair of type ii endoleak  04/12/2015  . Radiology with anesthesia N/A 04/12/2015    Procedure: embolization;  Surgeon: Jacqulynn Cadet, MD;  Location: [place];  Service: Radiology;  Laterality: N/A;   Family History  Problem Relation Age of Onset  . Aneurysm Mother     AAA  . Heart disease Mother     Heart Disease before age 65  . Other Mother     Carotid artery stenosis  . Hyperlipidemia Mother   . Hypertension Mother   . Aneurysm Maternal Aunt     AAA  . Other Maternal Aunt     AAA  . Aneurysm Maternal Uncle     AAA  . Other Maternal Uncle     AAA  . Lupus Sister   . Cancer Sister     Ovarian  . Heart disease Sister   . Heart disease Brother     Heart Disease before age 9  . Hypertension Brother   . Heart attack Brother   . Heart disease Brother     Before age 27  . Heart attack Brother   . Heart attack Brother   . Hyperlipidemia Father   . Hypertension Father     Social History  Substance Use Topics  . Smoking status: Former Smoker -- 2.00 packs/day for 30 years    Types: Cigarettes    Quit date: 02/17/2001  . Smokeless tobacco: Never Used  . Alcohol Use: 1.2 oz/week    2 Cans of beer per week    Review of Systems  Constitutional: Negative for fever.  Respiratory: Negative for cough and shortness of breath.   Cardiovascular: Negative for chest pain.  Musculoskeletal: Positive for myalgias.  Neurological: Positive for numbness.      Allergies  Review of patient's allergies indicates no known allergies.  Home Medications   Prior to Admission medications   Medication Sig Start Date End Date Taking? Authorizing Provider  albuterol (PROVENTIL HFA;VENTOLIN HFA) 108 (90 BASE) MCG/ACT inhaler Inhale 2 puffs into the lungs daily as needed for wheezing or shortness of breath.    Historical Provider, MD  aspirin 325 MG tablet Take 325 mg by mouth daily.      Historical Provider, MD  cholecalciferol (VITAMIN D) 1000 UNITS tablet Take 1,000 Units by mouth daily.    Historical Provider, MD  clonazePAM (KLONOPIN) 1 MG tablet Take 1 tablet (1 mg total) by mouth 2 (two) times daily. 07/15/14   Larey Seat, MD  clopidogrel (PLAVIX) 75 MG tablet Take 75 mg by mouth daily.      Historical Provider, MD  cyanocobalamin (,VITAMIN B-12,) 1000 MCG/ML injection Inject 1,000 mcg into the muscle See admin instructions. Every 2 months at Dr. Keane Police office (next injection is due 04/10/15)    Historical Provider, MD  empagliflozin (JARDIANCE) 10 MG TABS tablet Take 10 mg by mouth daily.    Historical Provider, MD  escitalopram (LEXAPRO) 20 MG tablet Take 20 mg by mouth at bedtime.    Historical Provider, MD  ezetimibe-simvastatin (VYTORIN) 10-40 MG per tablet Take 1 tablet by mouth daily.     Historical Provider, MD  fenofibrate 160 MG tablet Take 160 mg by mouth daily.  11/03/10   Historical Provider, MD  Fluticasone-Salmeterol (ADVAIR) 100-50 MCG/DOSE AEPB  Inhale 1 puff into the lungs daily.    Historical Provider, MD  furosemide (LASIX) 40 MG tablet Take 40 mg by mouth every other day.     Historical Provider, MD  glimepiride (AMARYL) 4 MG tablet Take 4 mg by mouth at bedtime.     Historical Provider, MD  lansoprazole (PREVACID) 30  MG capsule Take 30 mg by mouth daily.  03/11/14   Historical Provider, MD  losartan (COZAAR) 50 MG tablet Take 50 mg by mouth daily.      Historical Provider, MD  metFORMIN (GLUCOPHAGE) 1000 MG tablet Take 1,000 mg by mouth 2 (two) times daily. Breakfast and at bedtime    Historical Provider, MD  metoprolol (LOPRESSOR) 50 MG tablet Take 50 mg by mouth 2 (two) times daily.     Historical Provider, MD  nitroGLYCERIN (NITROSTAT) 0.4 MG SL tablet Place 0.4 mg under the tongue every 5 (five) minutes as needed for chest pain.    Historical Provider, MD  pramipexole (MIRAPEX) 0.25 MG tablet Take 0.25 mg by mouth at bedtime.  03/24/12   Historical Provider, MD  pregabalin (LYRICA) 100 MG capsule Take 100-200 mg by mouth 2 (two) times daily. Take 1 tablet (100 mg) by mouth every morning and 2 tablets (200 mg) at bedtime    Historical Provider, MD  ranolazine (RANEXA) 1000 MG SR tablet Take 1,000 mg by mouth 2 (two) times daily.      Historical Provider, MD  terazosin (HYTRIN) 1 MG capsule Take 1 mg by mouth daily.     Historical Provider, MD  tiotropium (SPIRIVA) 18 MCG inhalation capsule Place 18 mcg into inhaler and inhale daily.     Historical Provider, MD   BP 150/78 mmHg  Pulse 78  Temp(Src) 98.4 F (36.9 C) (Oral)  Resp 16  SpO2 100% Physical Exam  Constitutional: He is oriented to person, place, and time. He appears well-developed and well-nourished.  HENT:  Head: Normocephalic and atraumatic.  Eyes: Conjunctivae and EOM are normal. Right eye exhibits no discharge. Left eye exhibits no discharge. No scleral icterus.  Neck: Normal range of motion. Neck supple.  Cardiovascular: Normal rate, regular rhythm, normal heart  sounds and intact distal pulses.   Pulmonary/Chest: Effort normal and breath sounds normal. No respiratory distress. He has no wheezes. He has no rales. He exhibits no tenderness.  Abdominal: Soft. He exhibits no distension.  Musculoskeletal: Normal range of motion. He exhibits tenderness. He exhibits no edema.  Left mid-calf tender to palpation  Palpable muscle spasm noted in left mid-calf No erythema or swelling noted to left lower extremity Full ROM of left knee, ankle, and foot with 5/5 strength 2+ PT and DP pulse  Sensation grossly intact Cap refill <2 seconds  Neurological: He is alert and oriented to person, place, and time.  Achilles reflex intact.   Skin: Skin is warm and dry.  Psychiatric: He has a normal mood and affect.  Nursing note and vitals reviewed.   ED Course  Procedures (including critical care time)  DIAGNOSTIC STUDIES: Oxygen Saturation is 98% on room air, normal by my interpretation.    COORDINATION OF CARE: 1:54 PM Discussed treatment plan with pt at bedside and pt agreed to plan. Pt advised to rest, heat and ice the area to reduce inflammation, and take NSAIDs as needed.    Labs Review Labs Reviewed - No data to display  Imaging Review Dg Tibia/fibula Left  06/27/2015  CLINICAL DATA:  Left calf pain EXAM: LEFT TIBIA AND FIBULA - 2 VIEW COMPARISON:  None. FINDINGS: Four views of the left tibia fibula submitted. No acute fracture or subluxation. No radiopaque foreign body. Minimal spurring of medial tibial plateau. IMPRESSION: Negative. Electronically Signed   By: Lahoma Crocker M.D.   On: 06/27/2015 12:58   I have personally reviewed and evaluated these images and lab  results as part of my medical decision-making.  Filed Vitals:   06/27/15 1222 06/27/15 1420  BP: 150/78   Pulse: 66 78  Temp: 98.4 F (36.9 C)   Resp: 16      MDM   Final diagnoses:  Left leg pain    Patient presents with left calf pain that occurred after reaching for an object  while sitting on his tiptoes. Denies fall, head injury or LOC. VSS. Exam revealed tenderness to left posterior calf with palpable muscle spasm noted in mid calf, no swelling to left lower extremity. Left lower extremity otherwise neurovascularly intact. No calf swelling, paralysis, collateral superficial veins, tenderness along deep veins or edema present. No bony abnormality or deformity, no erythema or excessive heat, no evidence of cellulitis, DVT, or septic joint. Left tib-fib x-ray negative. I suspect patient's pain is likely due to muscle strain/spasm associated with recent injury. Due to patient's extensive medical history and current medications I discussed symptomatic treatment with him involving resting/elevating his leg and using slipped. Advised patient to follow up with his PCP.  Evaluation does not show pathology requring ongoing emergent intervention or admission. Pt is hemodynamically stable and mentating appropriately. Discussed findings/results and plan with patient/guardian, who agrees with plan. All questions answered. Return precautions discussed and outpatient follow up given.    I personally performed the services described in this documentation, which was scribed in my presence. The recorded information has been reviewed and is accurate.    Chesley Noon [place], [place] 06/27/15 1435  Lacretia Leigh, MD 06/27/15 [phone]

## 2015-06-27 NOTE — Discharge Instructions (Signed)
I recommend elevating your leg, rest seen and applying heat for 15-20 minutes 3-4 times daily for pain relief. Follow-up with your primary care provider in the next 2 days. Return to the emergency department if symptoms worsen or new onset of fever, redness, swelling, numbness, tingling, weakness, chest pain, shortness of breath.

## 2015-06-27 NOTE — ED Notes (Signed)
Pt began to have L posterior calf pain after reaching on tip toes to get an object. Pt reports pain/numbness radiates to foot. Pain worse in certain positions. Has only been able to bear weight when standing in certain positions.

## 2015-07-06 ENCOUNTER — Telehealth: Payer: Self-pay

## 2015-07-06 DIAGNOSIS — F325 Major depressive disorder, single episode, in full remission: Secondary | ICD-10-CM | POA: Diagnosis not present

## 2015-07-06 DIAGNOSIS — E668 Other obesity: Secondary | ICD-10-CM | POA: Diagnosis not present

## 2015-07-06 DIAGNOSIS — R413 Other amnesia: Secondary | ICD-10-CM | POA: Diagnosis not present

## 2015-07-06 DIAGNOSIS — D519 Vitamin B12 deficiency anemia, unspecified: Secondary | ICD-10-CM | POA: Diagnosis not present

## 2015-07-06 DIAGNOSIS — I119 Hypertensive heart disease without heart failure: Secondary | ICD-10-CM | POA: Diagnosis not present

## 2015-07-06 DIAGNOSIS — I739 Peripheral vascular disease, unspecified: Secondary | ICD-10-CM | POA: Diagnosis not present

## 2015-07-06 DIAGNOSIS — M1990 Unspecified osteoarthritis, unspecified site: Secondary | ICD-10-CM | POA: Diagnosis not present

## 2015-07-06 DIAGNOSIS — D696 Thrombocytopenia, unspecified: Secondary | ICD-10-CM | POA: Diagnosis not present

## 2015-07-06 DIAGNOSIS — E1151 Type 2 diabetes mellitus with diabetic peripheral angiopathy without gangrene: Secondary | ICD-10-CM | POA: Diagnosis not present

## 2015-07-06 NOTE — Telephone Encounter (Signed)
Cardiac clearance placed in MR nurse fax box to be faxed to [facility] fax [phone]

## 2015-07-12 ENCOUNTER — Encounter: Payer: Self-pay | Admitting: Interventional Cardiology

## 2015-07-12 ENCOUNTER — Ambulatory Visit (INDEPENDENT_AMBULATORY_CARE_PROVIDER_SITE_OTHER): Payer: Commercial Managed Care - HMO | Admitting: Interventional Cardiology

## 2015-07-12 VITALS — BP 118/74 | HR 51 | Ht 72.0 in | Wt 220.1 lb

## 2015-07-12 DIAGNOSIS — I25810 Atherosclerosis of coronary artery bypass graft(s) without angina pectoris: Secondary | ICD-10-CM

## 2015-07-12 DIAGNOSIS — E785 Hyperlipidemia, unspecified: Secondary | ICD-10-CM

## 2015-07-12 DIAGNOSIS — I2510 Atherosclerotic heart disease of native coronary artery without angina pectoris: Secondary | ICD-10-CM | POA: Diagnosis not present

## 2015-07-12 DIAGNOSIS — I25708 Atherosclerosis of coronary artery bypass graft(s), unspecified, with other forms of angina pectoris: Secondary | ICD-10-CM | POA: Insufficient documentation

## 2015-07-12 DIAGNOSIS — I714 Abdominal aortic aneurysm, without rupture, unspecified: Secondary | ICD-10-CM

## 2015-07-12 DIAGNOSIS — I10 Essential (primary) hypertension: Secondary | ICD-10-CM | POA: Diagnosis not present

## 2015-07-12 HISTORY — DX: Atherosclerosis of coronary artery bypass graft(s) without angina pectoris: I25.810

## 2015-07-12 MED ORDER — ASPIRIN EC 81 MG PO TBEC: 81.0000 mg | DELAYED_RELEASE_TABLET | Freq: Every day | ORAL | Status: DC

## 2015-07-12 NOTE — Patient Instructions (Addendum)
Medication Instructions:  Your physician has recommended you make the following change in your medication:  DECREASE Aspirin to 81mg  daily   Labwork: None ordered  Testing/Procedures: None ordered  Follow-Up: Your physician wants you to follow-up in: 1 year with Dr.Ravelo You will receive a reminder letter in the mail two months in advance. If you don't receive a letter, please call our office to schedule the follow-up appointment.   Any Other Special Instructions Will Be Listed Below (If Applicable).  You have been cleared for surgery with Dr.Supple. Clearance was faxed to his office on 07/06/15.  HOLD Plavix and Aspirin 7 days prior to surgery    If you need a refill on your cardiac medications before your next appointment, please call your pharmacy.

## 2015-07-12 NOTE — Progress Notes (Signed)
Cardiology Office Note   Date:  07/12/2015   ID:  Christian Mccall, DOB Dec 09, 1955, MRN [id number]  PCP:  Precious Reel, MD  Cardiologist:  Sinclair Grooms, MD   Chief Complaint  Patient presents with  . Medical Clearance  . Coronary Artery Disease      History of Present Illness: Christian Mccall is a 60 y.o. male who presents for  Coronary artery disease, coronary bypass grafting 2002 , bypass graft a Dr. Richardson Landry in 2010 by angiography, abdominal aortic aneurysm with endovascular repair and known endoleak, COPD, type 2 diabetes, prior myocardial infarction, history of CVA, obstructive sleep apnea, essential hypertension and hyperlipidemia.   The patient has multiple cardiovascular elements and high risk comorbidities. He has recently been managed for abdominal aortic aneurysm endoleak. He has chronic background dyspnea an intermittent chest discomfort. Chest discomfort he is usually nonexertional and responsive to nitroglycerin. No change in pattern of dyspnea or chest discomfort over the past 6-12 months. He denies orthopnea, PND, and edema.   No neurological complaints.   He is having much difficulty with the right shoulder and is to have surgery performed by Dr. Lennette Bihari supple. They seek cardiology clearance for the surgical procedure.  Past Medical History  Diagnosis Date  . Hypertension   . Hyperlipidemia   . AAA (abdominal aortic aneurysm) ([place])   . CAD (coronary artery disease)   . Leg pain     with walking and pain in feet while lying flat  . Shortness of breath 12/06/10    while lying flat and with exertion  . GERD (gastroesophageal reflux disease)   . Enlarged prostate   . Restless leg syndrome   . Memory loss     Due to hardening of the arteries in the brain  . Snoring   . Anginal pain ([place])   . COPD (chronic obstructive pulmonary disease) ([place])   . Emphysema lung ([place])   . Type II diabetes mellitus ([place]) dx'd 2009  . Headache     "~ weekly" (04/12/2015)  . TIA  (transient ischemic attack) "several"    "since 2015" (04/12/2015)  . Stroke [facility]) 2004    denies residual on 04/12/2015  . Chronic back pain   . Bulging lumbar disc     "& some in my neck"  . Diabetic peripheral neuropathy ([employer])   . Myocardial infarction ([place])     "I've had probably 6-7" (04/12/2015)  . Heart attack [facility]) Nov. 15,2016    TIA    Past Surgical History  Procedure Laterality Date  . Abdominal aortic aneurysm repair  01-16-11    EVAR  . Peripheral vascular catheterization N/A 03/10/2015    Procedure: Abdominal Aortogram;  Surgeon: Elam Dutch, MD;  Location: [place] [facility];  Service: Cardiovascular;  Laterality: N/A;  . Cardiac catheterization  "several"  . Coronary angioplasty with stent placement  "several"    > 4 sents  . Colonoscopy    . Coronary artery bypass graft  02/18/2001    "CABG x 4"  . Colonoscopy w/ biopsies    . Abdominal aortic aneurysm repair  04/12/2015  . Tonsillectomy  ~ 1965  . Direct sac puncture with liquid embolic repair of type ii endoleak  04/12/2015  . Radiology with anesthesia N/A 04/12/2015    Procedure: embolization;  Surgeon: Jacqulynn Cadet, MD;  Location: [place];  Service: Radiology;  Laterality: N/A;     Current Outpatient Prescriptions  Medication Sig Dispense Refill  . albuterol (  PROVENTIL HFA;VENTOLIN HFA) 108 (90 BASE) MCG/ACT inhaler Inhale 2 puffs into the lungs daily as needed for wheezing or shortness of breath.    [name] aspirin 325 MG tablet Take 325 mg by mouth daily.      . cholecalciferol (VITAMIN D) 1000 UNITS tablet Take 1,000 Units by mouth daily.    . clonazePAM (KLONOPIN) 1 MG tablet Take 1 tablet (1 mg total) by mouth 2 (two) times daily. 60 tablet 5  . clopidogrel (PLAVIX) 75 MG tablet Take 75 mg by mouth daily.      . cyanocobalamin (,VITAMIN B-12,) 1000 MCG/ML injection Inject 1,000 mcg into the muscle See admin instructions. Every 2 months at Dr. Keane Police office (next injection is due 04/10/15)    .  empagliflozin (JARDIANCE) 10 MG TABS tablet Take 10 mg by mouth daily.    [name] escitalopram (LEXAPRO) 20 MG tablet Take 20 mg by mouth at bedtime.    [name] ezetimibe-simvastatin (VYTORIN) 10-40 MG per tablet Take 1 tablet by mouth daily.     . fenofibrate 160 MG tablet Take 160 mg by mouth daily.     . Fluticasone-Salmeterol (ADVAIR) 100-50 MCG/DOSE AEPB Inhale 1 puff into the lungs daily.    . furosemide (LASIX) 40 MG tablet Take 40 mg by mouth every other day.     [name] glimepiride (AMARYL) 4 MG tablet Take 4 mg by mouth at bedtime.     . lansoprazole (PREVACID) 30 MG capsule Take 30 mg by mouth daily.   3  . losartan (COZAAR) 50 MG tablet Take 50 mg by mouth daily.      . metFORMIN (GLUCOPHAGE) 1000 MG tablet Take 1,000 mg by mouth 2 (two) times daily. Breakfast and at bedtime    . metoprolol (LOPRESSOR) 50 MG tablet Take 50 mg by mouth 2 (two) times daily.     . nitroGLYCERIN (NITROSTAT) 0.4 MG SL tablet Place 0.4 mg under the tongue every 5 (five) minutes as needed for chest pain.    . pramipexole (MIRAPEX) 0.25 MG tablet Take 0.25 mg by mouth at bedtime.     . pregabalin (LYRICA) 100 MG capsule Take 100-200 mg by mouth 2 (two) times daily. Take 1 tablet (100 mg) by mouth every morning and 2 tablets (200 mg) at bedtime    . ranolazine (RANEXA) 1000 MG SR tablet Take 1,000 mg by mouth 2 (two) times daily.      [name] terazosin (HYTRIN) 1 MG capsule Take 1 mg by mouth daily.     [name] tiotropium (SPIRIVA) 18 MCG inhalation capsule Place 18 mcg into inhaler and inhale daily.      No current facility-administered medications for this visit.    Allergies:   Review of patient's allergies indicates no known allergies.    Social History:  The patient  reports that he quit smoking about 14 years ago. His smoking use included Cigarettes. He has a 60 pack-year smoking history. He has never used smokeless tobacco. He reports that he drinks about 1.2 oz of alcohol per week. He reports that he does not use illicit drugs.    Family History:  The patient's family history includes Aneurysm in his maternal aunt, maternal uncle, and mother; Cancer in his sister; Heart attack in his brother, brother, and brother; Heart disease in his brother, brother, mother, and sister; Hyperlipidemia in his father and mother; Hypertension in his brother, father, and mother; Lupus in his sister; Other in his maternal aunt, maternal uncle, and mother.    ROS:  Please see the history of present illness.   Otherwise, review of systems are positive for  Dizziness, back discomfort, muscle pain, chronic shortness of breath, snoring, anxiety, difficulty with balance, headaches, exertional fatigue, and leg pain..   All other systems are reviewed and negative.    PHYSICAL EXAM: VS:  BP 118/74 mmHg  Pulse 51  Ht 6' (1.829 m)  Wt 220 lb 1.9 oz (99.846 kg)  BMI 29.85 kg/m2 , BMI Body mass index is 29.85 kg/(m^2). GEN: Well nourished, well developed, in no acute distress HEENT: normal Neck: no JVD, carotid bruits, or masses Cardiac: RRR.  There  Faint scratchy right upper sternal border systolic murmur. No rub, or gallop. There is  no edema. Respiratory:  clear to auscultation bilaterally, normal work of breathing. GI: soft, nontender, nondistended, + BS MS: no deformity or atrophy Skin: warm and dry, no rash Neuro:  Strength and sensation are intact Psych: euthymic mood, full affect   EKG:  EKG  Is not ordered today.   Recent Labs: 04/12/2015: BUN 11; Creatinine, Ser 0.77; Potassium 4.1; Sodium 138 04/13/2015: Hemoglobin 12.9*; Platelets 84*    Lipid Panel    Component Value Date/Time   CHOL 129 05/02/2013 0410   TRIG 230* 05/02/2013 0410   HDL 69 05/02/2013 0410   CHOLHDL 1.9 05/02/2013 0410   VLDL 46* 05/02/2013 0410   LDLCALC 14 05/02/2013 0410      Wt Readings from Last 3 Encounters:  07/12/15 220 lb 1.9 oz (99.846 kg)  05/18/15 224 lb (101.606 kg)  04/12/15 217 lb (98.43 kg)      Other studies  Reviewed: Additional studies/ records that were reviewed today include:  Recent electronic health record injuries have been reviewed.. The findings include  Recent therapy for aortic abdominal endoleak were reviewed. Laboratory data was reviewed..    ASSESSMENT AND PLAN:  1. CAD in native artery  known coronary artery disease with prior coronary bypass grafting and known bypass graft failure. Currently stable from the standpoint of angina or heart failure complaints.  2. Abdominal aortic aneurysm (AAA) without rupture (HCC)  recent percutaneous repair of endoleak 4 abdominal aortic aneurysm endoprosthesis.  3. Essential hypertension, benign  well controlled.  4. Hyperlipidemia  on therapy and controlled.  5. Preoperative clearance Surgical clearance for right shoulder by Dr. Justice Britain M.D.  6. Abdominal aortic aneurysm with endoleak  Current medicines are reviewed at length with the patient today.  The patient has the following concerns regarding medicines:  none.  The following changes/actions have been instituted:    Cleared for upcoming surgery.  Discontinue aspirin and Plavix 78 prior to the procedure.   physical activity is encouraged  Labs/ tests ordered today include:  No orders of the defined types were placed in this encounter.     Disposition:   FU with HS in 1 year  Signed, Sinclair Grooms, MD  07/12/2015 [facility] [address], [place], [place]  [address] Phone: [phone]; Fax: [phone]

## 2015-07-17 NOTE — Pre-Procedure Instructions (Addendum)
Christian Mccall  07/17/2015      [id number], [place] [facility] [place] [address] [place] [address] Phone: [phone] Fax: [phone]    Your procedure is scheduled on Thurs, Feb 16 @ 12:30 PM  Report to [facility] Admitting at 10:30 AM  Call this number if you have problems the morning of surgery:  [phone]   Remember:  Do not eat food or drink liquids after midnight.  Take these medicines the morning of surgery with A SIP OF WATER Albuterol<Bring Your Inhaler With You>,Klonopin(Clonazepam),Advair(Fluticasone),Prevacid(Lansoprazole),Metoprolol(Lopressor),Pregabalin(Lyrica),and Spiriva(Tiopriopium)             Stop taking your Aspirin and Plavix 7-8 days before surgery as indicated in clearance note from Dr.H Tamala Julian. No Goody's,BC's,Aleve,Advil,Motrin,Ibuprofen,Fish Oil,or any Herbal Medications.                How to Manage Your Diabetes Before Surgery   Why is it important to control my blood sugar before and after surgery?   Improving blood sugar levels before and after surgery helps healing and can limit problems.  A way of improving blood sugar control is eating a healthy diet by:  - Eating less sugar and carbohydrates  - Increasing activity/exercise  - Talk with your doctor about reaching your blood sugar goals  High blood sugars (greater than 180 mg/dL) can raise your risk of infections and slow down your recovery so you will need to focus on controlling your diabetes during the weeks before surgery.  Make sure that the doctor who takes care of your diabetes knows about your planned surgery including the date and location.  How do I manage my blood sugars before surgery?   Check your blood sugar at least 4 times a day, 2 days before surgery to make sure that they are not too high or low.   Check your blood sugar the morning of your surgery when you wake up and every 2               hours until you  get to the Short-Stay unit.  If your blood sugar is less than 70 mg/dL, you will need to treat for low blood sugar by:  Treat a low blood sugar (less than 70 mg/dL) with 1/2 cup of clear juice (cranberry or apple), 4 glucose tablets, OR glucose gel.  Recheck blood sugar in 15 minutes after treatment (to make sure it is greater than 70 mg/dL).  If blood sugar is not greater than 70 mg/dL on re-check, call [phone] for further instructions.   Report your blood sugar to the Short-Stay nurse when you get to Short-Stay.  References:  [facility], 2007 "How to Manage your Diabetes Before and After Surgery".  What do I do about my diabetes medications?   Do not take oral diabetes medicines (pills) the morning of surgery.          Do not take other diabetes injectables the day of surgery including Byetta, Victoza, Bydureon, and Trulicity.    If your CBG is greater than 220 mg/dL, you may take 1/2 of your sliding scale (correction) dose of insulin.   For patients with "Insulin Pumps":  Contact your diabetes doctor for specific instructions before surgery.   Decrease basal insulin rates by 20% at midnight the night before surgery.  Note that if your surgery is planned to be longer than 2 hours, your insulin pump will be removed and intravenous (  IV) insulin will be started and managed by the nurses and anesthesiologist.  You will be able to restart your insulin pump once you are awake and able to manage it.  Make sure to bring insulin pump supplies to the hospital with you in case your site needs to be changed.            Do not wear jewelry.  Do not wear lotions, powders, or colognes.  You may wear deodorant.  Men may shave face and neck.  Do not bring valuables to the hospital.  [facility] is not responsible for any belongings or valuables.  Contacts, dentures or bridgework may not be worn into surgery.  Leave your suitcase in the car.   After surgery it may be brought to your room.  For patients admitted to the hospital, discharge time will be determined by your treatment team.  Patients discharged the day of surgery will not be allowed to drive home.    Special instructions:  [place] - Preparing for Surgery  Before surgery, you can play an important role.  Because skin is not sterile, your skin needs to be as free of germs as possible.  You can reduce the number of germs on you skin by washing with CHG (chlorahexidine gluconate) soap before surgery.  CHG is an antiseptic cleaner which kills germs and bonds with the skin to continue killing germs even after washing.  Please DO NOT use if you have an allergy to CHG or antibacterial soaps.  If your skin becomes reddened/irritated stop using the CHG and inform your nurse when you arrive at Short Stay.  Do not shave (including legs and underarms) for at least 48 hours prior to the first CHG shower.  You may shave your face.  Please follow these instructions carefully:   1.  Shower with CHG Soap the night before surgery and the                                morning of Surgery.  2.  If you choose to wash your hair, wash your hair first as usual with your       normal shampoo.  3.  After you shampoo, rinse your hair and body thoroughly to remove the                      Shampoo.  4.  Use CHG as you would any other liquid soap.  You can apply chg directly       to the skin and wash gently with scrungie or a clean washcloth.  5.  Apply the CHG Soap to your body ONLY FROM THE NECK DOWN.        Do not use on open wounds or open sores.  Avoid contact with your eyes,       ears, mouth and genitals (private parts).  Wash genitals (private parts)       with your normal soap.  6.  Wash thoroughly, paying special attention to the area where your surgery        will be performed.  7.  Thoroughly rinse your body with warm water from the neck down.  8.  DO NOT shower/wash with your normal  soap after using and rinsing off       the CHG Soap.  9.  Pat yourself dry with a clean towel.  10.  Wear clean pajamas.            11.  Place clean sheets on your bed the night of your first shower and do not        sleep with pets.  Day of Surgery  Do not apply any lotions/deoderants the morning of surgery.  Please wear clean clothes to the hospital/surgery center.    Please read over the following fact sheets that you were given. Pain Booklet, Coughing and Deep Breathing and Surgical Site Infection Prevention

## 2015-07-18 ENCOUNTER — Encounter (HOSPITAL_COMMUNITY): Payer: Self-pay

## 2015-07-18 ENCOUNTER — Encounter (HOSPITAL_COMMUNITY)
Admission: RE | Admit: 2015-07-18 | Discharge: 2015-07-18 | Disposition: A | Discharge status: Home / Self Care | Payer: Commercial Managed Care - HMO | Source: Ambulatory Visit | Attending: Orthopedic Surgery | Admitting: Orthopedic Surgery

## 2015-07-18 DIAGNOSIS — Z8673 Personal history of transient ischemic attack (TIA), and cerebral infarction without residual deficits: Secondary | ICD-10-CM | POA: Diagnosis not present

## 2015-07-18 DIAGNOSIS — M19011 Primary osteoarthritis, right shoulder: Secondary | ICD-10-CM | POA: Insufficient documentation

## 2015-07-18 DIAGNOSIS — R413 Other amnesia: Secondary | ICD-10-CM | POA: Insufficient documentation

## 2015-07-18 DIAGNOSIS — M7501 Adhesive capsulitis of right shoulder: Secondary | ICD-10-CM | POA: Insufficient documentation

## 2015-07-18 DIAGNOSIS — Z951 Presence of aortocoronary bypass graft: Secondary | ICD-10-CM | POA: Diagnosis not present

## 2015-07-18 DIAGNOSIS — K219 Gastro-esophageal reflux disease without esophagitis: Secondary | ICD-10-CM | POA: Diagnosis not present

## 2015-07-18 DIAGNOSIS — Z01818 Encounter for other preprocedural examination: Secondary | ICD-10-CM | POA: Diagnosis not present

## 2015-07-18 DIAGNOSIS — E785 Hyperlipidemia, unspecified: Secondary | ICD-10-CM | POA: Diagnosis not present

## 2015-07-18 DIAGNOSIS — J449 Chronic obstructive pulmonary disease, unspecified: Secondary | ICD-10-CM | POA: Insufficient documentation

## 2015-07-18 DIAGNOSIS — Z7984 Long term (current) use of oral hypoglycemic drugs: Secondary | ICD-10-CM | POA: Diagnosis not present

## 2015-07-18 DIAGNOSIS — I10 Essential (primary) hypertension: Secondary | ICD-10-CM | POA: Diagnosis not present

## 2015-07-18 DIAGNOSIS — E119 Type 2 diabetes mellitus without complications: Secondary | ICD-10-CM | POA: Diagnosis not present

## 2015-07-18 DIAGNOSIS — Z87891 Personal history of nicotine dependence: Secondary | ICD-10-CM | POA: Diagnosis not present

## 2015-07-18 DIAGNOSIS — Z7902 Long term (current) use of antithrombotics/antiplatelets: Secondary | ICD-10-CM | POA: Diagnosis not present

## 2015-07-18 DIAGNOSIS — Z79899 Other long term (current) drug therapy: Secondary | ICD-10-CM | POA: Diagnosis not present

## 2015-07-18 DIAGNOSIS — I2510 Atherosclerotic heart disease of native coronary artery without angina pectoris: Secondary | ICD-10-CM | POA: Insufficient documentation

## 2015-07-18 DIAGNOSIS — Z01812 Encounter for preprocedural laboratory examination: Secondary | ICD-10-CM | POA: Insufficient documentation

## 2015-07-18 DIAGNOSIS — I252 Old myocardial infarction: Secondary | ICD-10-CM | POA: Diagnosis not present

## 2015-07-18 DIAGNOSIS — M7541 Impingement syndrome of right shoulder: Secondary | ICD-10-CM | POA: Insufficient documentation

## 2015-07-18 DIAGNOSIS — Z7982 Long term (current) use of aspirin: Secondary | ICD-10-CM | POA: Diagnosis not present

## 2015-07-18 HISTORY — DX: Reserved for concepts with insufficient information to code with codable children: IMO0002

## 2015-07-18 HISTORY — DX: Thrombocytopenia, unspecified: D69.6

## 2015-07-18 HISTORY — DX: Splenomegaly, not elsewhere classified: R16.1

## 2015-07-18 HISTORY — DX: Hepatomegaly, not elsewhere classified: R16.0

## 2015-07-18 HISTORY — DX: Cerebral atherosclerosis: I67.2

## 2015-07-18 LAB — COMPREHENSIVE METABOLIC PANEL
ALK PHOS: 45 U/L (ref 38–126)
ALT: 24 U/L (ref 17–63)
AST: 29 U/L (ref 15–41)
Albumin: 4.1 g/dL (ref 3.5–5.0)
Anion gap: 14 (ref 5–15)
BILIRUBIN TOTAL: 0.9 mg/dL (ref 0.3–1.2)
BUN: 14 mg/dL (ref 6–20)
CALCIUM: 9.6 mg/dL (ref 8.9–10.3)
CHLORIDE: 102 mmol/L (ref 101–111)
CO2: 24 mmol/L (ref 22–32)
CREATININE: 1 mg/dL (ref 0.61–1.24)
Glucose, Bld: 125 mg/dL — ABNORMAL HIGH (ref 65–99)
Potassium: 4.4 mmol/L (ref 3.5–5.1)
Sodium: 140 mmol/L (ref 135–145)
TOTAL PROTEIN: 7.4 g/dL (ref 6.5–8.1)

## 2015-07-18 LAB — CBC WITH DIFFERENTIAL/PLATELET
Basophils Absolute: 0 10*3/uL (ref 0.0–0.1)
Basophils Relative: 1 %
EOS PCT: 1 %
Eosinophils Absolute: 0.1 10*3/uL (ref 0.0–0.7)
HEMATOCRIT: 41.8 % (ref 39.0–52.0)
Hemoglobin: 13.9 g/dL (ref 13.0–17.0)
LYMPHS ABS: 1.7 10*3/uL (ref 0.7–4.0)
LYMPHS PCT: 33 %
MCH: 30.6 pg (ref 26.0–34.0)
MCHC: 33.3 g/dL (ref 30.0–36.0)
MCV: 92.1 fL (ref 78.0–100.0)
Monocytes Absolute: 0.5 10*3/uL (ref 0.1–1.0)
Monocytes Relative: 10 %
NEUTROS ABS: 2.8 10*3/uL (ref 1.7–7.7)
Neutrophils Relative %: 55 %
PLATELETS: 65 10*3/uL — AB (ref 150–400)
RBC: 4.54 MIL/uL (ref 4.22–5.81)
RDW: 13.8 % (ref 11.5–15.5)
WBC: 5.2 10*3/uL (ref 4.0–10.5)

## 2015-07-18 LAB — PROTIME-INR
INR: 1.18 (ref 0.00–1.49)
PROTHROMBIN TIME: 15.2 s (ref 11.6–15.2)

## 2015-07-18 LAB — APTT: aPTT: 32 seconds (ref 24–37)

## 2015-07-20 ENCOUNTER — Encounter (HOSPITAL_COMMUNITY): Payer: Self-pay

## 2015-07-20 ENCOUNTER — Telehealth: Payer: Self-pay | Admitting: Interventional Cardiology

## 2015-07-20 NOTE — Telephone Encounter (Signed)
New Message: Nurse is calling to get some records from a clearance that was faxed on 1/28. Please f/u with her

## 2015-07-20 NOTE — Telephone Encounter (Signed)
Velvet calling from [employer] stating they received fax that cleared him from surgery but states see notes and there weren't any notes.  Read last OV note to her that says to stop ASA and Plavix 78 hrs prior to procedure.  Also faxed note to her at [phone].

## 2015-07-20 NOTE — Progress Notes (Addendum)
Anesthesia Chart Review:  Pt is a 60 year old male scheduled for R shoulder arthroscopy with manipulation under anesthesia, lysis of adhesions, subacromial decompression and distal clavicle resection on 07/27/2015 with Dr. Onnie Graham.   PCP is Dr. Shon Baton. Cardiologist is Dr. Daneen Schick.   PMH includes:  CAD (MI 2002, s/p CABG 2002), angina, HTN, DM, hyperlipidemia, stroke (2014, 2015), TIA, AAA (s/p EVAR 2012), COPD, emphysema, memory loss, GERD, thrombocytopenia. Former smoker. BMI 30.5. S/p repair type II endoleak of AAA 04/12/15.   Medications include: albuterol, ASA, plavix, empagliflozin, vytorin, fenofibrate, advair, lasix, glimepiride, prevacid, losartan, metformin, metoprolol, mirapex, lyrica, ranolazine, terazosin, spiriva. Pt to stop plavix 7 days before surgery.   Preoperative labs reviewed. Platelets 65. Per notes from PCP's office, pt with known thrombocytopenia since 2012. Saw Dr. Alen Blew with hem-onc (last visit 08/12/13 in [place]). He noted: "The differential diagnosis includes autoimmune thrombocytopenia versus related to liver disease and a likely as a sign of a hematological disorder. His white cell count and his hemoglobin as well as his differential is perfectly normal. At this point I see no indication for further management her workup. He has no bleeding symptoms and with a platelet count that is 80,000 he could withstand most surgical procedures. Should he have a more invasive surgeries such as neurosurgical procedure, we can certainly give him a trial of steroids to boost his blood counts in anticipation of that procedure."  EKG 04/04/2015: Sinus bradycardia (56 bpm).   Carotid duplex US 02/10/2014: R CCA disease present of less than 50% mid segment. B ICA stenosis in the <40% range.   Nuclear stress test 05/03/2013:  1. Mild matched attenuation involving the inferior wall without associated wall motion abnormality. No definitive scintigraphic evidence of prior infarction or  pharmacologically induced ischemia. 2. Normal wall motion. Ejection fraction - 59%.  Cardiac cath 08/11/2008:  - Coronary angiography.  a. Left main coronary: Widely patent.  b. Left anterior descending coronary: Totally occluded in the  midvessel.  c. Circumflex artery: 99% OM #1 but this vessel is grafted.  It supplies collaterals to the distal right coronary.  d. Right coronary: Totally occluded. - Bypass graft angiography.  a. Saphenous vein graft to the diagonal #1: Widely patent.  b. Saphenous vein graft to the OM #1: Diffuse disease with up  to 60% proximal stenosis but patent.  c. Saphenous vein graft to RCA: Totally occluded. - LIMA to LAD: Widely patent  Pt has cardiac clearance for surgery from Dr. Tamala Julian (epic note dated 07/12/15).   Last office note from Dr. Virgina Jock dated 07/06/15 notes platelets were 61, and he referred pt back to hematology. I do not see that the pt has yet been seen by heme.   Willeen Cass, FNP-BC [facility]/Anesthesiology Phone: [phone] 07/20/2015 4:44 PM  Addendum: Pt saw Dr. Alen Blew for evaluation of thrombocytopenia on 07/24/15. Pt received platelet transfusion 07/26/15 in anticipation of surgery 07/27/15. Will recheck CBC in the morning prior to surgery.   Willeen Cass, FNP-BC [facility]/Anesthesiology Phone: [phone] 07/26/2015 12:35 PM

## 2015-07-21 ENCOUNTER — Other Ambulatory Visit: Payer: Self-pay | Admitting: Oncology

## 2015-07-21 ENCOUNTER — Telehealth: Payer: Self-pay

## 2015-07-21 DIAGNOSIS — D693 Immune thrombocytopenic purpura: Secondary | ICD-10-CM

## 2015-07-21 NOTE — Telephone Encounter (Signed)
Pt was last seen 08/12/13 for fluctuating thrombocytopenia.  MD note at that time indicated surgical procedures with plt count at 80 was adequate.  Pt is scheduled for shoulder arthroscopy 2/16.  Labs on 2/7 platelets at 65.  Anesthesiologist is refusing surgery d/t platelet count; wants clearance from [facility].  Please advise.

## 2015-07-24 ENCOUNTER — Telehealth: Payer: Self-pay | Admitting: Oncology

## 2015-07-24 ENCOUNTER — Telehealth: Payer: Self-pay | Admitting: *Deleted

## 2015-07-24 ENCOUNTER — Ambulatory Visit (HOSPITAL_BASED_OUTPATIENT_CLINIC_OR_DEPARTMENT_OTHER): Payer: Commercial Managed Care - HMO | Admitting: Oncology

## 2015-07-24 ENCOUNTER — Other Ambulatory Visit (HOSPITAL_BASED_OUTPATIENT_CLINIC_OR_DEPARTMENT_OTHER): Payer: Commercial Managed Care - HMO

## 2015-07-24 ENCOUNTER — Encounter: Payer: Self-pay | Admitting: *Deleted

## 2015-07-24 VITALS — BP 142/73 | HR 56 | Temp 98.2°F | Resp 18 | Ht 71.0 in | Wt 221.6 lb

## 2015-07-24 DIAGNOSIS — D696 Thrombocytopenia, unspecified: Secondary | ICD-10-CM

## 2015-07-24 DIAGNOSIS — M25519 Pain in unspecified shoulder: Secondary | ICD-10-CM | POA: Diagnosis not present

## 2015-07-24 DIAGNOSIS — D693 Immune thrombocytopenic purpura: Secondary | ICD-10-CM

## 2015-07-24 LAB — CBC WITH DIFFERENTIAL/PLATELET
BASO%: 0.7 % (ref 0.0–2.0)
BASOS ABS: 0 10*3/uL (ref 0.0–0.1)
EOS ABS: 0.1 10*3/uL (ref 0.0–0.5)
EOS%: 1.7 % (ref 0.0–7.0)
HEMATOCRIT: 42 % (ref 38.4–49.9)
HEMOGLOBIN: 14.1 g/dL (ref 13.0–17.1)
LYMPH#: 1.6 10*3/uL (ref 0.9–3.3)
LYMPH%: 32.6 % (ref 14.0–49.0)
MCH: 30.3 pg (ref 27.2–33.4)
MCHC: 33.5 g/dL (ref 32.0–36.0)
MCV: 90.3 fL (ref 79.3–98.0)
MONO#: 0.4 10*3/uL (ref 0.1–0.9)
MONO%: 9 % (ref 0.0–14.0)
NEUT#: 2.8 10*3/uL (ref 1.5–6.5)
NEUT%: 56 % (ref 39.0–75.0)
Platelets: 67 10*3/uL — ABNORMAL LOW (ref 140–400)
RBC: 4.65 10*6/uL (ref 4.20–5.82)
RDW: 14.4 % (ref 11.0–14.6)
WBC: 4.9 10*3/uL (ref 4.0–10.3)

## 2015-07-24 NOTE — Telephone Encounter (Signed)
per pof to sch pt appt-MW stated no openings-cld sickle cell to sch pt appt-gave pt app time & date-gave avs

## 2015-07-24 NOTE — Progress Notes (Signed)
Hematology and Oncology Follow Up Visit  Christian Mccall [id number] [birth year] 60 y.o. 07/24/2015 9:34 AM   Principle Diagnosis: 60 year old gentleman with chronic fluctuating thrombocytopenia. The etiology related to chronic liver disease, splenomegaly and potentially autoimmunity.  Current therapy: Observation and surveillance.  Interim History:  Christian Mccall presents today for a followup visit. He is a gentleman I saw in consultation 2 years ago for thrombocytopenia. Since the last visit, he reports no major changes in his health. He is scheduled to have shoulder operation in the near future. It is anticipated to be minimally invasive procedure via scope. I was asked to comment about his thrombocytopenia and anticipation of that procedure. He has not reported any recent spontaneous bleeding. He did have aneurysm repair back in November 2016. At that time, his platelet count was around 50,000 and received platelet transfusion with appropriate response up to 80,000. Other than shoulder pain, he has no recent complaints.  He has not reported any hematochezia or melena. Has not reported any epistaxis or petechiae. Has not reported any constitutional symptoms of weight loss appetite changes or changes in his performance status.  He denied any headaches, blurry vision, syncope or seizures. He does not report any chest pain, palpitation or orthopnea. He does not report any cough, wheezing or hemoptysis. Does not report any nausea, vomiting or abdominal pain. Does not report any constipation or diarrhea. He does not report any frequency urgency or hesitancy. Remaining review of systems unremarkable.  Medications: I have reviewed the patient's current medications.  Current Outpatient Prescriptions  Medication Sig Dispense Refill  . albuterol (PROVENTIL HFA;VENTOLIN HFA) 108 (90 BASE) MCG/ACT inhaler Inhale 2 puffs into the lungs daily as needed for wheezing or shortness of breath.    [name] aspirin EC 81 MG  tablet Take 1 tablet (81 mg total) by mouth daily.    . cholecalciferol (VITAMIN D) 1000 UNITS tablet Take 1,000 Units by mouth daily.    . clonazePAM (KLONOPIN) 1 MG tablet Take 1 tablet (1 mg total) by mouth 2 (two) times daily. 60 tablet 5  . clopidogrel (PLAVIX) 75 MG tablet Take 75 mg by mouth daily.      . cyanocobalamin (,VITAMIN B-12,) 1000 MCG/ML injection Inject 1,000 mcg into the muscle See admin instructions. Just had 07/06/15    . empagliflozin (JARDIANCE) 10 MG TABS tablet Take 10 mg by mouth daily.    [name] escitalopram (LEXAPRO) 20 MG tablet Take 20 mg by mouth at bedtime.    [name] ezetimibe-simvastatin (VYTORIN) 10-40 MG per tablet Take 1 tablet by mouth daily.     . fenofibrate 160 MG tablet Take 160 mg by mouth daily.     . Fluticasone-Salmeterol (ADVAIR) 100-50 MCG/DOSE AEPB Inhale 1 puff into the lungs daily.    . furosemide (LASIX) 40 MG tablet Take 40 mg by mouth every other day.     [name] glimepiride (AMARYL) 4 MG tablet Take 4 mg by mouth at bedtime.     . lansoprazole (PREVACID) 30 MG capsule Take 30 mg by mouth daily.   3  . losartan (COZAAR) 50 MG tablet Take 50 mg by mouth daily.      . metFORMIN (GLUCOPHAGE) 1000 MG tablet Take 1,000 mg by mouth 2 (two) times daily. Breakfast and at bedtime    . metoprolol (LOPRESSOR) 50 MG tablet Take 50 mg by mouth 2 (two) times daily.     . nitroGLYCERIN (NITROSTAT) 0.4 MG SL tablet Place 0.4 mg under the tongue every 5 (five)  minutes as needed for chest pain.    . pramipexole (MIRAPEX) 0.25 MG tablet Take 0.25 mg by mouth at bedtime.     . pregabalin (LYRICA) 100 MG capsule Take 100-200 mg by mouth 2 (two) times daily. Take 1 tablet (100 mg) by mouth every morning and 2 tablets (200 mg) at bedtime    . ranolazine (RANEXA) 1000 MG SR tablet Take 1,000 mg by mouth 2 (two) times daily.      [name] terazosin (HYTRIN) 1 MG capsule Take 1 mg by mouth daily.     [name] tiotropium (SPIRIVA) 18 MCG inhalation capsule Place 18 mcg into inhaler and inhale daily.       No current facility-administered medications for this visit.     Allergies: No Known Allergies  Past Medical History, Surgical history, Social history, and Family History were reviewed and updated.  Physical Exam: Blood pressure 142/73, pulse 56, temperature 98.2 F (36.8 C), temperature source Oral, resp. rate 18, height 5\' 11"  (1.803 m), weight 221 lb 9.6 oz (100.517 kg), SpO2 97 %. ECOG: 1 General appearance: alert and cooperative appeared without distress.  Head: Normocephalic, without obvious abnormality Neck: no adenopathy, no carotid bruit, no JVD and supple, symmetrical, trachea midline Lymph nodes: Cervical, supraclavicular, and axillary nodes normal. Heart:regular rate and rhythm, S1, S2 normal, no murmur, click, rub or gallop Lung: Clear to auscultation without wheezes or dullness to percussion.  Abdomin: soft, non-tender, without masses or organomegaly no shifting dullness or ascites. EXT:no erythema, induration, or nodules   Lab Results: Lab Results  Component Value Date   WBC 4.9 07/24/2015   HGB 14.1 07/24/2015   HCT 42.0 07/24/2015   MCV 90.3 07/24/2015   PLT 67* 07/24/2015     Chemistry      Component Value Date/Time   NA 140 07/18/2015 1035   NA 137 05/05/2013 1347   K 4.4 07/18/2015 1035   K 4.6 05/05/2013 1347   CL 102 07/18/2015 1035   CO2 24 07/18/2015 1035   CO2 22 05/05/2013 1347   BUN 14 07/18/2015 1035   BUN 12.9 05/05/2013 1347   CREATININE 1.00 07/18/2015 1035   CREATININE 1.06 02/16/2015 1019   CREATININE 1.2 05/05/2013 1347      Component Value Date/Time   CALCIUM 9.6 07/18/2015 1035   CALCIUM 10.2 05/05/2013 1347   ALKPHOS 45 07/18/2015 1035   ALKPHOS 69 05/05/2013 1347   AST 29 07/18/2015 1035   AST 33 05/05/2013 1347   ALT 24 07/18/2015 1035   ALT 32 05/05/2013 1347   BILITOT 0.9 07/18/2015 1035   BILITOT 0.68 05/05/2013 1347        Impression and Plan:  60 year old gentleman with the following issues:  1.  Thrombocytopenia: the etiology is related to to liver disease and splenomegaly versus autoimmune thrombocytopenia. His platelet count today is 67 without any spontaneous bleeding.  His platelet count should be adequate to undergo arthroscopic shoulder procedure without any bleeding complications. To be more cautious given his potential liver disease, I will arrange for prophylactic platelet transfusion in anticipation of the procedure on 07/26/2015. That will ensure adequate hemostasis for that procedure. There will be no need to repeat his platelet count on the day of the procedure.  I do not think a trial of steroids is necessary at this time given his diabetes. Steroids will certainly cause hypoglycemia and likely delay his procedure from that standpoint and will likely not help his platalets at this time.   2. Follow-up: Will  be in 3 months to recheck his platelet counts and further investigation of the etiology of his thrombocytopenia.   [facility], MD 2/13/20179:34 AM

## 2015-07-24 NOTE — Telephone Encounter (Signed)
added per Dixie pof not done

## 2015-07-24 NOTE — Telephone Encounter (Signed)
error 

## 2015-07-26 ENCOUNTER — Ambulatory Visit (HOSPITAL_COMMUNITY)
Admission: RE | Admit: 2015-07-26 | Discharge: 2015-07-26 | Disposition: A | Discharge status: Home / Self Care | Payer: Commercial Managed Care - HMO | Source: Ambulatory Visit | Attending: Oncology | Admitting: Oncology

## 2015-07-26 ENCOUNTER — Other Ambulatory Visit: Payer: Self-pay | Admitting: *Deleted

## 2015-07-26 VITALS — BP 133/61 | HR 60 | Temp 97.5°F | Resp 16

## 2015-07-26 DIAGNOSIS — D696 Thrombocytopenia, unspecified: Secondary | ICD-10-CM | POA: Diagnosis not present

## 2015-07-26 LAB — ABO/RH: ABO/RH(D): A NEG

## 2015-07-26 MED ORDER — SODIUM CHLORIDE 0.9 % IV SOLN
250.0000 mL | Freq: Once | INTRAVENOUS | Status: AC
  Administered 2015-07-26: 250 mL via INTRAVENOUS

## 2015-07-26 MED ORDER — SODIUM CHLORIDE 0.9% FLUSH: 10.0000 mL | INTRAVENOUS | Status: DC | PRN

## 2015-07-26 MED ORDER — SODIUM CHLORIDE 0.9 % IV SOLN: 250.0000 mL | Freq: Once | INTRAVENOUS | Status: DC

## 2015-07-26 MED ORDER — SODIUM CHLORIDE 0.9% FLUSH: 3.0000 mL | INTRAVENOUS | Status: DC | PRN

## 2015-07-26 MED ORDER — DIPHENHYDRAMINE HCL 25 MG PO CAPS
25.0000 mg | ORAL_CAPSULE | Freq: Once | ORAL | Status: AC
  Administered 2015-07-26: 25 mg via ORAL
  Filled 2015-07-26: qty 1

## 2015-07-26 MED ORDER — CEFAZOLIN SODIUM-DEXTROSE 2-3 GM-% IV SOLR
2.0000 g | INTRAVENOUS | Status: AC
  Administered 2015-07-27: 2 g via INTRAVENOUS
  Filled 2015-07-26: qty 50

## 2015-07-26 MED ORDER — ACETAMINOPHEN 325 MG PO TABS
650.0000 mg | ORAL_TABLET | Freq: Once | ORAL | Status: AC
  Administered 2015-07-26: 650 mg via ORAL
  Filled 2015-07-26: qty 2

## 2015-07-26 MED ORDER — HEPARIN SOD (PORK) LOCK FLUSH 100 UNIT/ML IV SOLN: 250.0000 [IU] | INTRAVENOUS | Status: DC | PRN

## 2015-07-26 MED ORDER — CHLORHEXIDINE GLUCONATE 4 % EX LIQD: 60.0000 mL | Freq: Once | CUTANEOUS | Status: DC

## 2015-07-26 MED ORDER — LACTATED RINGERS IV SOLN
INTRAVENOUS | Status: DC
  Administered 2015-07-27 (×2): via INTRAVENOUS

## 2015-07-26 MED ORDER — HEPARIN SOD (PORK) LOCK FLUSH 100 UNIT/ML IV SOLN: 500.0000 [IU] | Freq: Every day | INTRAVENOUS | Status: DC | PRN

## 2015-07-26 NOTE — Progress Notes (Signed)
Medical Provider: Zola Button MD  Associated diagnosis: Thrombocytopenia (HCC) (287.5)           Procedure: 1 unit Platlets transfused  Tolerated transfusion well. No reaction.  Alert, oriented and ambulatory at the end of the procedure. Discharged to home with wife.

## 2015-07-27 ENCOUNTER — Ambulatory Visit (HOSPITAL_COMMUNITY): Payer: Commercial Managed Care - HMO | Admitting: Emergency Medicine

## 2015-07-27 ENCOUNTER — Ambulatory Visit (HOSPITAL_COMMUNITY): Payer: Commercial Managed Care - HMO | Admitting: Certified Registered Nurse Anesthetist

## 2015-07-27 ENCOUNTER — Encounter (HOSPITAL_COMMUNITY)
Admission: RE | Disposition: A | Discharge status: Home / Self Care | Payer: Self-pay | Source: Ambulatory Visit | Attending: Orthopedic Surgery

## 2015-07-27 ENCOUNTER — Encounter (HOSPITAL_COMMUNITY): Payer: Self-pay | Admitting: *Deleted

## 2015-07-27 ENCOUNTER — Ambulatory Visit (HOSPITAL_COMMUNITY)
Admission: RE | Admit: 2015-07-27 | Discharge: 2015-07-27 | Disposition: A | Discharge status: Home / Self Care | Payer: Commercial Managed Care - HMO | Source: Ambulatory Visit | Attending: Orthopedic Surgery | Admitting: Orthopedic Surgery

## 2015-07-27 DIAGNOSIS — I252 Old myocardial infarction: Secondary | ICD-10-CM | POA: Diagnosis not present

## 2015-07-27 DIAGNOSIS — S43431A Superior glenoid labrum lesion of right shoulder, initial encounter: Secondary | ICD-10-CM | POA: Diagnosis not present

## 2015-07-27 DIAGNOSIS — Z7984 Long term (current) use of oral hypoglycemic drugs: Secondary | ICD-10-CM | POA: Diagnosis not present

## 2015-07-27 DIAGNOSIS — I10 Essential (primary) hypertension: Secondary | ICD-10-CM | POA: Diagnosis not present

## 2015-07-27 DIAGNOSIS — M7541 Impingement syndrome of right shoulder: Secondary | ICD-10-CM | POA: Insufficient documentation

## 2015-07-27 DIAGNOSIS — Z7902 Long term (current) use of antithrombotics/antiplatelets: Secondary | ICD-10-CM | POA: Diagnosis not present

## 2015-07-27 DIAGNOSIS — E785 Hyperlipidemia, unspecified: Secondary | ICD-10-CM | POA: Insufficient documentation

## 2015-07-27 DIAGNOSIS — Z8673 Personal history of transient ischemic attack (TIA), and cerebral infarction without residual deficits: Secondary | ICD-10-CM | POA: Insufficient documentation

## 2015-07-27 DIAGNOSIS — G2581 Restless legs syndrome: Secondary | ICD-10-CM | POA: Insufficient documentation

## 2015-07-27 DIAGNOSIS — M7501 Adhesive capsulitis of right shoulder: Secondary | ICD-10-CM | POA: Insufficient documentation

## 2015-07-27 DIAGNOSIS — J449 Chronic obstructive pulmonary disease, unspecified: Secondary | ICD-10-CM | POA: Insufficient documentation

## 2015-07-27 DIAGNOSIS — Z79899 Other long term (current) drug therapy: Secondary | ICD-10-CM | POA: Insufficient documentation

## 2015-07-27 DIAGNOSIS — E119 Type 2 diabetes mellitus without complications: Secondary | ICD-10-CM | POA: Diagnosis not present

## 2015-07-27 DIAGNOSIS — M19011 Primary osteoarthritis, right shoulder: Secondary | ICD-10-CM | POA: Insufficient documentation

## 2015-07-27 DIAGNOSIS — Z955 Presence of coronary angioplasty implant and graft: Secondary | ICD-10-CM | POA: Insufficient documentation

## 2015-07-27 DIAGNOSIS — I2510 Atherosclerotic heart disease of native coronary artery without angina pectoris: Secondary | ICD-10-CM | POA: Insufficient documentation

## 2015-07-27 DIAGNOSIS — Z7982 Long term (current) use of aspirin: Secondary | ICD-10-CM | POA: Diagnosis not present

## 2015-07-27 DIAGNOSIS — G8929 Other chronic pain: Secondary | ICD-10-CM | POA: Diagnosis not present

## 2015-07-27 DIAGNOSIS — Z87891 Personal history of nicotine dependence: Secondary | ICD-10-CM | POA: Diagnosis not present

## 2015-07-27 HISTORY — PX: SHOULDER SURGERY: SHX246

## 2015-07-27 LAB — PREPARE PLATELET PHERESIS: Unit division: 0

## 2015-07-27 LAB — CBC
HCT: 40.3 % (ref 39.0–52.0)
Hemoglobin: 13 g/dL (ref 13.0–17.0)
MCH: 29.7 pg (ref 26.0–34.0)
MCHC: 32.3 g/dL (ref 30.0–36.0)
MCV: 92 fL (ref 78.0–100.0)
PLATELETS: 72 10*3/uL — AB (ref 150–400)
RBC: 4.38 MIL/uL (ref 4.22–5.81)
RDW: 13.9 % (ref 11.5–15.5)
WBC: 4 10*3/uL (ref 4.0–10.5)

## 2015-07-27 LAB — GLUCOSE, CAPILLARY: GLUCOSE-CAPILLARY: 169 mg/dL — AB (ref 65–99)

## 2015-07-27 SURGERY — SHOULDER ARTHROSCOPY WITH SUBACROMIAL DECOMPRESSION AND DISTAL CLAVICLE EXCISION
Anesthesia: General | Site: Shoulder | Laterality: Right

## 2015-07-27 MED ORDER — SODIUM CHLORIDE 0.9 % IR SOLN
Status: DC | PRN
  Administered 2015-07-27 (×2): 3000 mL

## 2015-07-27 MED ORDER — HYDROMORPHONE HCL 1 MG/ML IJ SOLN
0.2500 mg | INTRAMUSCULAR | Status: DC | PRN
  Administered 2015-07-27 (×2): 0.5 mg via INTRAVENOUS

## 2015-07-27 MED ORDER — FENTANYL CITRATE (PF) 100 MCG/2ML IJ SOLN
INTRAMUSCULAR | Status: DC | PRN
  Administered 2015-07-27 (×2): 50 ug via INTRAVENOUS

## 2015-07-27 MED ORDER — PROPOFOL 10 MG/ML IV BOLUS
INTRAVENOUS | Status: DC | PRN
  Administered 2015-07-27: 30 mg via INTRAVENOUS
  Administered 2015-07-27: 200 mg via INTRAVENOUS
  Administered 2015-07-27: 20 mg via INTRAVENOUS

## 2015-07-27 MED ORDER — OXYCODONE-ACETAMINOPHEN 5-325 MG PO TABS
ORAL_TABLET | ORAL | Status: AC
  Filled 2015-07-27: qty 2

## 2015-07-27 MED ORDER — 0.9 % SODIUM CHLORIDE (POUR BTL) OPTIME
TOPICAL | Status: DC | PRN
  Administered 2015-07-27: 1000 mL

## 2015-07-27 MED ORDER — ROCURONIUM BROMIDE 100 MG/10ML IV SOLN
INTRAVENOUS | Status: DC | PRN
  Administered 2015-07-27: 50 mg via INTRAVENOUS

## 2015-07-27 MED ORDER — PROPOFOL 10 MG/ML IV BOLUS
INTRAVENOUS | Status: AC
  Filled 2015-07-27: qty 20

## 2015-07-27 MED ORDER — PROMETHAZINE HCL 25 MG/ML IJ SOLN: 6.2500 mg | INTRAMUSCULAR | Status: DC | PRN

## 2015-07-27 MED ORDER — PHENYLEPHRINE HCL 10 MG/ML IJ SOLN
INTRAMUSCULAR | Status: DC | PRN
  Administered 2015-07-27: 100 ug via INTRAVENOUS
  Administered 2015-07-27 (×3): 40 ug via INTRAVENOUS

## 2015-07-27 MED ORDER — OXYCODONE HCL 5 MG/5ML PO SOLN: 5.0000 mg | Freq: Once | ORAL | Status: DC | PRN

## 2015-07-27 MED ORDER — FENTANYL CITRATE (PF) 250 MCG/5ML IJ SOLN
INTRAMUSCULAR | Status: AC
  Filled 2015-07-27: qty 5

## 2015-07-27 MED ORDER — LACTATED RINGERS IV SOLN
INTRAVENOUS | Status: DC
  Administered 2015-07-27: 12:00:00 via INTRAVENOUS

## 2015-07-27 MED ORDER — OXYCODONE-ACETAMINOPHEN 5-325 MG PO TABS: 1.0000 | ORAL_TABLET | ORAL | Status: DC | PRN

## 2015-07-27 MED ORDER — NEOSTIGMINE METHYLSULFATE 10 MG/10ML IV SOLN
INTRAVENOUS | Status: DC | PRN
  Administered 2015-07-27: 3 mg via INTRAVENOUS

## 2015-07-27 MED ORDER — OXYCODONE-ACETAMINOPHEN 5-325 MG PO TABS
2.0000 | ORAL_TABLET | Freq: Once | ORAL | Status: AC
  Administered 2015-07-27: 2 via ORAL

## 2015-07-27 MED ORDER — LIDOCAINE HCL (CARDIAC) 20 MG/ML IV SOLN
INTRAVENOUS | Status: DC | PRN
  Administered 2015-07-27: 100 mg via INTRAVENOUS

## 2015-07-27 MED ORDER — MIDAZOLAM HCL 5 MG/5ML IJ SOLN
INTRAMUSCULAR | Status: DC | PRN
  Administered 2015-07-27: 2 mg via INTRAVENOUS

## 2015-07-27 MED ORDER — MIDAZOLAM HCL 2 MG/2ML IJ SOLN
INTRAMUSCULAR | Status: AC
  Filled 2015-07-27: qty 2

## 2015-07-27 MED ORDER — GLYCOPYRROLATE 0.2 MG/ML IJ SOLN
INTRAMUSCULAR | Status: DC | PRN
  Administered 2015-07-27: .4 mg via INTRAVENOUS
  Administered 2015-07-27: .1 mg via INTRAVENOUS

## 2015-07-27 MED ORDER — ONDANSETRON HCL 4 MG/2ML IJ SOLN
INTRAMUSCULAR | Status: DC | PRN
  Administered 2015-07-27: 4 mg via INTRAVENOUS

## 2015-07-27 MED ORDER — METHOCARBAMOL 500 MG PO TABS: 500.0000 mg | ORAL_TABLET | Freq: Three times a day (TID) | ORAL | Status: DC | PRN

## 2015-07-27 MED ORDER — OXYCODONE HCL 5 MG PO TABS: 5.0000 mg | ORAL_TABLET | Freq: Once | ORAL | Status: DC | PRN

## 2015-07-27 MED ORDER — METHOCARBAMOL 1000 MG/10ML IJ SOLN
500.0000 mg | Freq: Once | INTRAVENOUS | Status: DC
  Filled 2015-07-27: qty 5

## 2015-07-27 MED ORDER — PROPOFOL 10 MG/ML IV BOLUS
INTRAVENOUS | Status: AC
  Filled 2015-07-27: qty 40

## 2015-07-27 MED ORDER — HYDROMORPHONE HCL 1 MG/ML IJ SOLN
INTRAMUSCULAR | Status: AC
  Filled 2015-07-27: qty 1

## 2015-07-27 MED ORDER — ONDANSETRON HCL 4 MG PO TABS: 4.0000 mg | ORAL_TABLET | Freq: Three times a day (TID) | ORAL | Status: DC | PRN

## 2015-07-27 MED ORDER — METHOCARBAMOL 500 MG PO TABS
ORAL_TABLET | ORAL | Status: AC
  Administered 2015-07-27: 500 mg
  Filled 2015-07-27: qty 1

## 2015-07-27 SURGICAL SUPPLY — 57 items
BLADE CUTTER GATOR 3.5 (BLADE) ×3 IMPLANT
BLADE GREAT WHITE 4.2 (BLADE) ×2 IMPLANT
BLADE GREAT WHITE 4.2MM (BLADE) ×1
BLADE SURG 11 STRL SS (BLADE) ×3 IMPLANT
BOOTCOVER CLEANROOM LRG (PROTECTIVE WEAR) ×6 IMPLANT
BUR OVAL 4.0 (BURR) ×3 IMPLANT
CANISTER SUCT LVC 12 LTR MEDI- (MISCELLANEOUS) ×3 IMPLANT
CANNULA ACUFLEX KIT 5X76 (CANNULA) ×3 IMPLANT
CANNULA DRILOCK 5.0MMX75MM (CANNULA) ×1
CANNULA DRILOCK 5.0X75 (CANNULA) ×2 IMPLANT
CLOSURE WOUND 1/2 X4 (GAUZE/BANDAGES/DRESSINGS) ×1
CONNECTOR 5 IN 1 STRAIGHT STRL (MISCELLANEOUS) ×3 IMPLANT
DRAPE INCISE 23X17 IOBAN STRL (DRAPES)
DRAPE INCISE IOBAN 23X17 STRL (DRAPES) IMPLANT
DRAPE INCISE IOBAN 66X45 STRL (DRAPES) ×3 IMPLANT
DRAPE ORTHO SPLIT 77X108 STRL (DRAPES) ×6
DRAPE STERI 35X30 U-POUCH (DRAPES) IMPLANT
DRAPE SURG 17X11 SM STRL (DRAPES) ×3 IMPLANT
DRAPE SURG ORHT 6 SPLT 77X108 (DRAPES) ×2 IMPLANT
DRAPE U-SHAPE 47X51 STRL (DRAPES) IMPLANT
DRSG PAD ABDOMINAL 8X10 ST (GAUZE/BANDAGES/DRESSINGS) ×3 IMPLANT
DURAPREP 26ML APPLICATOR (WOUND CARE) ×3 IMPLANT
GAUZE SPONGE 4X4 12PLY STRL (GAUZE/BANDAGES/DRESSINGS) ×3 IMPLANT
GLOVE BIO SURGEON STRL SZ7.5 (GLOVE) ×3 IMPLANT
GLOVE BIO SURGEON STRL SZ8 (GLOVE) ×3 IMPLANT
GLOVE BIOGEL PI IND STRL 7.0 (GLOVE) ×1 IMPLANT
GLOVE BIOGEL PI INDICATOR 7.0 (GLOVE) ×2
GLOVE EUDERMIC 7 POWDERFREE (GLOVE) ×3 IMPLANT
GLOVE SS BIOGEL STRL SZ 7.5 (GLOVE) ×1 IMPLANT
GLOVE SUPERSENSE BIOGEL SZ 7.5 (GLOVE) ×2
GLOVE SURG SS PI 7.5 STRL IVOR (GLOVE) ×3 IMPLANT
GOWN STRL REUS W/ TWL LRG LVL3 (GOWN DISPOSABLE) ×1 IMPLANT
GOWN STRL REUS W/ TWL XL LVL3 (GOWN DISPOSABLE) ×2 IMPLANT
GOWN STRL REUS W/TWL LRG LVL3 (GOWN DISPOSABLE) ×3
GOWN STRL REUS W/TWL XL LVL3 (GOWN DISPOSABLE) ×6
KIT BASIN OR (CUSTOM PROCEDURE TRAY) ×3 IMPLANT
KIT ROOM TURNOVER OR (KITS) ×3 IMPLANT
KIT SHOULDER TRACTION (DRAPES) ×3 IMPLANT
MANIFOLD NEPTUNE II (INSTRUMENTS) ×3 IMPLANT
NEEDLE SPNL 18GX3.5 QUINCKE PK (NEEDLE) ×3 IMPLANT
NS IRRIG 1000ML POUR BTL (IV SOLUTION) ×3 IMPLANT
PACK SHOULDER (CUSTOM PROCEDURE TRAY) ×3 IMPLANT
PAD ARMBOARD 7.5X6 YLW CONV (MISCELLANEOUS) ×6 IMPLANT
SET ARTHROSCOPY TUBING (MISCELLANEOUS) ×3
SET ARTHROSCOPY TUBING LN (MISCELLANEOUS) ×1 IMPLANT
SLING ARM LRG ADULT FOAM STRAP (SOFTGOODS) ×3 IMPLANT
SLING ARM MED ADULT FOAM STRAP (SOFTGOODS) IMPLANT
SPONGE LAP 4X18 X RAY DECT (DISPOSABLE) IMPLANT
STRIP CLOSURE SKIN 1/2X4 (GAUZE/BANDAGES/DRESSINGS) ×2 IMPLANT
SUT MNCRL AB 3-0 PS2 18 (SUTURE) ×3 IMPLANT
SUT PDS AB 0 CT 36 (SUTURE) IMPLANT
SYR 20CC LL (SYRINGE) IMPLANT
TAPE PAPER 3X10 WHT MICROPORE (GAUZE/BANDAGES/DRESSINGS) ×3 IMPLANT
TOWEL OR 17X24 6PK STRL BLUE (TOWEL DISPOSABLE) ×3 IMPLANT
TOWEL OR 17X26 10 PK STRL BLUE (TOWEL DISPOSABLE) ×3 IMPLANT
WAND SUCTION MAX 4MM 90S (SURGICAL WAND) ×3 IMPLANT
WATER STERILE IRR 1000ML POUR (IV SOLUTION) ×3 IMPLANT

## 2015-07-27 NOTE — Op Note (Signed)
NAME:  Christian Mccall, Christian Mccall NO.:  [id number]  MEDICAL RECORD NO.:  [id number]  LOCATION:  [facility]                         FACILITY:  [facility]  PHYSICIAN:  Metta Clines. Shahira Fiske, M.D.  DATE OF BIRTH:  Oct 30, 1955  DATE OF PROCEDURE:  07/27/2015 DATE OF DISCHARGE:  07/27/2015                              OPERATIVE REPORT   PREOPERATIVE DIAGNOSES: 1. Chronic right shoulder impingement syndrome. 2. Right shoulder symptomatic AC joint arthropathy. 3. Possible right shoulder adhesive capsulitis.  POSTOPERATIVE DIAGNOSES: 1. Chronic right shoulder impingement. 2. Right shoulder degenerative labral tear. 3. Right shoulder symptomatic AC joint arthropathy.  PROCEDURE: 1. Right shoulder examination under anesthesia. 2. Right shoulder glenohumeral joint diagnostic arthroscopy. 3. Labral debridement. 4. Arthroscopic subacromial decompression and bursectomy. 5. Arthroscopic distal clavicle resection.  SURGEON:  Metta Clines. Jaymason Ledesma, M.D.  Terrence DupontOlivia Mackie A. Shuford, P.A.-C.  ANESTHESIA:  General endotracheal.  ESTIMATED BLOOD LOSS:  Minimal.  DRAINS:  None.  HISTORY:  [name] is a 60 year old gentleman who has had chronic right shoulder pain with impingement syndrome and clinical evidence of severely restricted mobility and limited motion suggestive of adhesive capsulitis.  His preoperative MRI scan additionally showed some changes suggestive of possible adhesive capsulitis.  Due to his ongoing pain, functional limitation, and failure to respond to conservative management, he was brought to the operating room at this time for planned right shoulder arthroscopy as described below.  Preoperatively, counseled [name] regarding treatment options, potential risks versus benefits thereof.  Possible surgical complications were all reviewed including bleeding, infection, neurovascular injury, persistent pain, loss of motion, anesthetic complication, and possible need for additional  surgery.  He understands and accepts and agrees with our planned procedure.  PROCEDURE IN DETAIL:  After undergoing routine preop evaluation, the patient received prophylactic antibiotics and an interscalene block was not utilized due to the patient's history of thrombocytopenia.  Of note, he had received a transfusion of platelets preoperatively but we did forego the interscalene block.  The patient was brought to the operating room and placed supine on the operating table, underwent smooth induction of a general endotracheal anesthesia.  Turned to left lateral decubitus position on a beanbag and appropriately padded and protected. The right shoulder examination under anesthesia revealed full motion, which was bit of a surprise given our concerns for adhesive capsulitis, but again examination did show full motion of the right shoulder.  Right arm was then suspended at 70 degrees of abduction with 10 pounds of traction, and the right shoulder girdle region was sterilely prepped and draped in standard fashion.  Time-out was called.  Posterior portal was established into the glenohumeral joint and anterior portal was established under direct visualization.  The articular surfaces were all found to be in excellent condition.  No instability patterns were noted. There was some mild diffuse synovitis in the glenohumeral joint.  There was extensive degenerative tearing of the superior half of labrum and changes consistent with a type 1 SLAP lesion.  These were all debrided with shaver back to stable margin.  Relatively large sublabral sulcus superiorly but no instability to biceps tendon.  Biceps was of normal caliber and no distal instability.  The  rotator cuff was carefully inspected and found to be intact with no obvious fraying, fibrillation, or degeneration when viewed from the articular side.  At this time, fluid and instruments were then removed.  The arm dropped down to 30 degrees of  abduction.  The arthroscope introduced in the subacromial space of posterior portal and a direct lateral portal site of the subacromial space.  Abundant dense bursal tissue, multiple adhesions were encountered.  These were all divided and excised from the shaver and Stryker wand.  The wand was then used to remove the periosteum from the undersurface of the anterior half of the acromion and then a subacromial decompression was performed with a bur creating a type 1 morphology.  The portal was then established directly anterior to the distal clavicle.  A distal clavicle resection was performed with a bur. Care was taken to confirm visualization of the entire circumference of the distal clavicle to ensure adequate removal of bone.  We then completed the subacromial/subdeltoid bursectomy.  The bursal surface rotator cuff careful inspected and probed and while there was some superficial bursal-sided fraying in the distal supraspinatus, certainly there are no significant defects.  At this point, we then completed bursectomy, hemostasis was obtained.  Fluid and instruments were removed.  The portals were closed with Monocryl and Steri-Strips.  A dry dressing was applied to the right shoulder, arm was placed in a sling. The patient was awakened, extubated, taken to recovery room in stable condition.  Tracy A. Shuford, P.A.-C. was used as an Environmental consultant throughout this case, essential for help with positioning of the patient, positioning of the extremity, management of the arthroscopic equipment, tissue manipulation, wound closure, and intraoperative decision making.     Metta Clines. Desean Heemstra, M.D.     KMS/MEDQ  D:  07/27/2015  T:  07/27/2015  Job:  [id number]

## 2015-07-27 NOTE — Anesthesia Procedure Notes (Signed)
Procedure Name: Intubation Date/Time: 07/27/2015 2:33 PM Performed by: Judeth Cornfield T Pre-anesthesia Checklist: Patient identified, Emergency Drugs available, Suction available, Patient being monitored and Timeout performed Patient Re-evaluated:Patient Re-evaluated prior to inductionOxygen Delivery Method: Circle system utilized Preoxygenation: Pre-oxygenation with 100% oxygen Intubation Type: IV induction Ventilation: Two handed mask ventilation required and Oral airway inserted - appropriate to patient size Laryngoscope Size: Mac and 3 Grade View: Grade I Tube type: Oral Tube size: 7.5 mm Number of attempts: 1 Airway Equipment and Method: Stylet Placement Confirmation: ETT inserted through vocal cords under direct vision,  positive ETCO2 and breath sounds checked- equal and bilateral Secured at: 23 cm Tube secured with: Tape Dental Injury: Teeth and Oropharynx as per pre-operative assessment

## 2015-07-27 NOTE — Anesthesia Preprocedure Evaluation (Addendum)
Anesthesia Evaluation  Patient identified by MRN, date of birth, ID band Patient awake    Reviewed: Allergy & Precautions, H&P , Patient's Chart, lab work & pertinent test results, reviewed documented beta blocker date and time   Airway Mallampati: III  TM Distance: >3 FB Neck ROM: full    Dental no notable dental hx.    Pulmonary former smoker,    Pulmonary exam normal breath sounds clear to auscultation       Cardiovascular hypertension, + angina + CAD   Rhythm:regular Rate:Normal     Neuro/Psych TIACVA    GI/Hepatic   Endo/Other  diabetes  Renal/GU      Musculoskeletal   Abdominal   Peds  Hematology   Anesthesia Other Findings Hypertension      AAA (abdominal aortic aneurysm) (HCC)  CAD (coronary artery disease)     GERD (gastroesophageal reflux disease)      Restless leg syndrome    Memory loss   Due to hardening of the arteries in the brain   Snoring    Anginal pain (HCC)     COPD (chronic obstructive pulmonary disease) ([place])    Emphysema lung (HCC)     Type II diabetes mellitus ([place]) dx'd 2009   Headache  "~ weekly" (04/12/2015)   TIA (transient ischemic attack) "several" "since 2015" (04/12/2015)  Stroke ([place]) 2004 denies residual on 04/12/2015      Myocardial infarction ([place])   (04/12/2015)   TIA     Spleen enlarged   receive platelets a few days ago.... 72K  Hardening of the arteries of the brain         Reproductive/Obstetrics                            Anesthesia Physical Anesthesia Plan  ASA: III  Anesthesia Plan: General   Post-op Pain Management:    Induction: Intravenous  Airway Management Planned: Oral ETT  Additional Equipment:   Intra-op Plan:   Post-operative Plan: Extubation in OR  Informed Consent: I have reviewed the patients History and Physical, chart, labs and discussed the procedure including the risks, benefits and  alternatives for the proposed anesthesia with the patient or authorized representative who has indicated his/her understanding and acceptance.   Dental Advisory Given and Dental advisory given  Plan Discussed with: CRNA and Surgeon  Anesthesia Plan Comments: (  Discussed general anesthesia, including possible nausea, instrumentation of airway, sore throat,pulmonary aspiration, etc. I asked if the were any outstanding questions, or  concerns before we proceeded. )        Anesthesia Quick Evaluation

## 2015-07-27 NOTE — Transfer of Care (Signed)
Immediate Anesthesia Transfer of Care Note  Patient: Christian Mccall  Procedure(s) Performed: Procedure(s): RIGHT SHOULDER ARTHROSCOPY WITH MANIPULATION UNDER ANESTHESIA, LYSIS OF ADHESIONS, SUBACROMIAL DECOMPRESSION AND DISTAL CLAVICLE RESECTION (Right)  Patient Location: PACU  Anesthesia Type:General  Level of Consciousness: awake, alert  and oriented  Airway & Oxygen Therapy: Patient Spontanous Breathing and Patient connected to face mask oxygen  Post-op Assessment: Report given to RN and Post -op Vital signs reviewed and stable  Post vital signs: Reviewed and stable  Last Vitals:  Filed Vitals:   07/27/15 1050  BP: 133/64  Pulse: 51  Resp: 18    Complications: No apparent anesthesia complications

## 2015-07-27 NOTE — Op Note (Signed)
07/27/2015  3:57 PM  PATIENT:   Christian Mccall  60 y.o. male  PRE-OPERATIVE DIAGNOSIS:  right shoulder impingement, AC Osteoarthitis, Adhesive Capsulitis  POST-OPERATIVE DIAGNOSIS:  R shoulder impingement, labral tear, AC joint OA  PROCEDURE:  RSA, labral debridement, SAD, DCR  SURGEON:  Jandy Brackens, Metta Clines M.D.  ASSISTANTS: Shuford pac   ANESTHESIA:   GET   EBL: min  SPECIMEN:  none  Drains: none   PATIENT DISPOSITION:  PACU - hemodynamically stable.    PLAN OF CARE: Discharge to home after PACU  Dictation# [id number]   Contact # [phone]

## 2015-07-27 NOTE — Discharge Instructions (Signed)
° °  Metta Clines. Supple, M.D., F.A.A.O.S. Orthopaedic Surgery Specializing in Arthroscopic and Reconstructive Surgery of the Shoulder and Knee [phone] [address] [place], [place] [address] - Fax [phone]   POST-OP SHOULDER ARTHROSCOPY INSTRUCTIONS  1. Call the office at [phone] to schedule your first post-op appointment 7-10 days from the date of your surgery.  2. Leave the steri-strips in place over your incisions when performing dressing changes and showering. You may remove your dressings and begin showering 72 hours from surgery. You can expect drainage that is clear to bloody in nature that occasionally will soak through your dressings. If this occurs go ahead and perform a dressing change. The drainage should lessen daily and when there is no drainage from your incisions feel free to go without a dressing.  3. Wear your sling for comfort. You may come out of your sling for ad lib activity and even decide not to use the sling at all. If you find you are more comfortable in your sling, make sure you come out of your sling at least 3-4 times a day to do the exercises that are included below.  4. Range of motion to your elbow, wrist, and hand are encouraged 3-5 times daily. Exercise to your hand and fingers helps to reduce swelling you may experience.  5. Utilize ice to the shoulder 3-4 times minimum a day and additionally if you are experiencing pain.  6. You may drive when safely off narcotics and muscle relaxants.  7. If you had a block pre-operatively to provide post-op pain relief you may want to go ahead and begin utilizing your pain meds as your arm begins to wake up. Blocks can sometimes last up to 16-18 hours. If you are still pain-free prior to going to bed you may want to strongly consider taking a pain medication to avoid being awakened in the night with the onset of pain. A muscle relaxant is also provided for you should you experience muscle spasms. It  is recommended that if you are experiencing pain that your pain medication alone is not controlling, add the muscle relaxant along with the pain medication which can give additional pain relief. The first one to two days is generally the most severe of your pain and then should gradually decrease. As your pain lessens it is recommended that you decrease your use of the pain medications to an "as needed basis" only and to always comply with the recommended dosages of the pain medications.  8. Pain medications can produce constipation along with their use. If you experience this, the use of an over the counter stool softener or laxative daily is recommended.   9. For additional questions or concerns, please do not hesitate to call the office. If after hours there is an answering service to forward your concerns to the physician on call.   POST-OP EXERCISES  The pendulum exercises should be performed while bending at the waist as far over as possible thereby letting gravity do the work for you.  Range of Motion Exercises: Pendulum (circular)  Repeat 20 times. Do 3 sessions per day.     Range of Motion Exercises: Pendulum (side-to-side)  Repeat 20 times. Do 3 sessions per day.    Range of Motion Exercises (self-stretching activities):  Slide arm up wall with palm toward you, moving closer to the wall. Hold for 5 seconds.  Repeat 10 times. Do 3 sessions per day.

## 2015-07-27 NOTE — H&P (Signed)
Christian Mccall    Chief Complaint: right shoulder impingement, AC Osteoarthitis, Adhesive Capsulitis HPI: The patient is a 60 y.o. male with chronic right shoulder adhesive capsulitis  Past Medical History  Diagnosis Date  . Hypertension   . Hyperlipidemia   . AAA (abdominal aortic aneurysm) ([place])   . CAD (coronary artery disease)   . Leg pain     with walking and pain in feet while lying flat  . Shortness of breath 12/06/10    while lying flat and with exertion  . GERD (gastroesophageal reflux disease)   . Enlarged prostate   . Restless leg syndrome   . Memory loss     Due to hardening of the arteries in the brain  . Snoring   . Anginal pain ([place])   . COPD (chronic obstructive pulmonary disease) ([place])   . Emphysema lung ([place])   . Type II diabetes mellitus ([place]) dx'd 2009  . Headache     "~ weekly" (04/12/2015)  . TIA (transient ischemic attack) "several"    "since 2015" (04/12/2015)  . Stroke [facility]) 2004    denies residual on 04/12/2015  . Chronic back pain   . Bulging lumbar disc     "& some in my neck"  . Diabetic peripheral neuropathy ([place])   . Myocardial infarction ([place])     "I've had probably 6-7" (04/12/2015)  . Heart attack [facility]) Nov. 15,2016    TIA  . Enlarged liver   . Spleen enlarged   . Change in platelet count     pt states 'has been low since 2012; patient had to receive platelets in surgery in Nov 2016'  . Hardening of the arteries of the brain   . Thrombocytopenia ([place])     since 2012; can be as low as 54K.     Past Surgical History  Procedure Laterality Date  . Abdominal aortic aneurysm repair  01-16-11    EVAR  . Peripheral vascular catheterization N/A 03/10/2015    Procedure: Abdominal Aortogram;  Surgeon: Elam Dutch, MD;  Location: [facility];  Service: Cardiovascular;  Laterality: N/A;  . Cardiac catheterization  "several"  . Coronary angioplasty with stent placement  "several"    > 4 sents  . Colonoscopy    . Coronary artery bypass  graft  02/18/2001    "CABG x 4"  . Colonoscopy w/ biopsies    . Abdominal aortic aneurysm repair  04/12/2015  . Tonsillectomy  ~ 1965  . Direct sac puncture with liquid embolic repair of type ii endoleak  04/12/2015  . Radiology with anesthesia N/A 04/12/2015    Procedure: embolization;  Surgeon: Jacqulynn Cadet, MD;  Location: [facility];  Service: Radiology;  Laterality: N/A;    Family History  Problem Relation Age of Onset  . Aneurysm Mother     AAA  . Heart disease Mother     Heart Disease before age 56  . Other Mother     Carotid artery stenosis  . Hyperlipidemia Mother   . Hypertension Mother   . Aneurysm Maternal Aunt     AAA  . Other Maternal Aunt     AAA  . Aneurysm Maternal Uncle     AAA  . Other Maternal Uncle     AAA  . Lupus Sister   . Cancer Sister     Ovarian  . Heart disease Sister   . Heart disease Brother     Heart Disease before age 16  . Hypertension  Brother   . Heart attack Brother   . Heart disease Brother     Before age 48  . Heart attack Brother   . Heart attack Brother   . Hyperlipidemia Father   . Hypertension Father     Social History:  reports that he quit smoking about 14 years ago. His smoking use included Cigarettes. He has a 60 pack-year smoking history. He has never used smokeless tobacco. He reports that he drinks about 1.2 oz of alcohol per week. He reports that he does not use illicit drugs.   Medications Prior to Admission  Medication Sig Dispense Refill  . albuterol (PROVENTIL HFA;VENTOLIN HFA) 108 (90 BASE) MCG/ACT inhaler Inhale 2 puffs into the lungs daily as needed for wheezing or shortness of breath.    [name] aspirin EC 81 MG tablet Take 1 tablet (81 mg total) by mouth daily.    . cholecalciferol (VITAMIN D) 1000 UNITS tablet Take 1,000 Units by mouth daily.    . clonazePAM (KLONOPIN) 1 MG tablet Take 1 tablet (1 mg total) by mouth 2 (two) times daily. 60 tablet 5  . clopidogrel (PLAVIX) 75 MG tablet Take 75 mg by mouth daily.       . cyanocobalamin (,VITAMIN B-12,) 1000 MCG/ML injection Inject 1,000 mcg into the muscle See admin instructions. Just had 07/06/15    . empagliflozin (JARDIANCE) 10 MG TABS tablet Take 10 mg by mouth daily.    [name] escitalopram (LEXAPRO) 20 MG tablet Take 20 mg by mouth at bedtime.    [name] ezetimibe-simvastatin (VYTORIN) 10-40 MG per tablet Take 1 tablet by mouth daily.     . fenofibrate 160 MG tablet Take 160 mg by mouth daily.     . Fluticasone-Salmeterol (ADVAIR) 100-50 MCG/DOSE AEPB Inhale 1 puff into the lungs daily.    . furosemide (LASIX) 40 MG tablet Take 40 mg by mouth every other day.     [name] glimepiride (AMARYL) 4 MG tablet Take 4 mg by mouth at bedtime.     . lansoprazole (PREVACID) 30 MG capsule Take 30 mg by mouth daily.   3  . losartan (COZAAR) 50 MG tablet Take 50 mg by mouth daily.      . metFORMIN (GLUCOPHAGE) 1000 MG tablet Take 1,000 mg by mouth 2 (two) times daily. Breakfast and at bedtime    . metoprolol (LOPRESSOR) 50 MG tablet Take 50 mg by mouth 2 (two) times daily.     . nitroGLYCERIN (NITROSTAT) 0.4 MG SL tablet Place 0.4 mg under the tongue every 5 (five) minutes as needed for chest pain.    . pramipexole (MIRAPEX) 0.25 MG tablet Take 0.25 mg by mouth at bedtime.     . pregabalin (LYRICA) 100 MG capsule Take 100-200 mg by mouth 2 (two) times daily. Take 1 tablet (100 mg) by mouth every morning and 2 tablets (200 mg) at bedtime    . ranolazine (RANEXA) 1000 MG SR tablet Take 1,000 mg by mouth 2 (two) times daily.      [name] terazosin (HYTRIN) 1 MG capsule Take 1 mg by mouth daily.     [name] tiotropium (SPIRIVA) 18 MCG inhalation capsule Place 18 mcg into inhaler and inhale daily.        Physical Exam: right shoulder with painful and restricted motion as noted at recent office visits  Vitals  Pulse Rate:  [51] 51 (02/16 1050) Resp:  [18] 18 (02/16 1050) BP: (133)/(64) 133/64 mmHg (02/16 1050) SpO2:  [97 %] 97 % (02/16  1050) Weight:  [100.517 kg (221 lb 9.6 oz)] 100.517 kg (221  lb 9.6 oz) (02/16 1050)  Assessment/Plan  Impression: right shoulder impingement, AC Osteoarthitis, Adhesive Capsulitis  Plan of Action: Procedure(s): RIGHT SHOULDER ARTHROSCOPY WITH MANIPULATION UNDER ANESTHESIA, LYSIS OF ADHESIONS, SUBACROMIAL DECOMPRESSION AND DISTAL CLAVICLE RESECTION  Edy Belt M Chelsea Nusz 07/27/2015, 1:07 PM Contact # [phone]

## 2015-07-28 LAB — HEMOGLOBIN A1C
Hgb A1c MFr Bld: 6.5 % — ABNORMAL HIGH (ref 4.8–5.6)
Mean Plasma Glucose: 140 mg/dL

## 2015-08-03 NOTE — Anesthesia Postprocedure Evaluation (Signed)
Anesthesia Post Note  Patient: Christian Mccall  Procedure(s) Performed: Procedure(s) (LRB): RIGHT SHOULDER ARTHROSCOPY WITH MANIPULATION UNDER ANESTHESIA, LYSIS OF ADHESIONS, SUBACROMIAL DECOMPRESSION AND DISTAL CLAVICLE RESECTION (Right)  Patient location during evaluation: PACU Anesthesia Type: General and Regional Level of consciousness: awake and alert Pain management: pain level controlled Vital Signs Assessment: post-procedure vital signs reviewed and stable Respiratory status: spontaneous breathing, nonlabored ventilation, respiratory function stable and patient connected to nasal cannula oxygen Cardiovascular status: blood pressure returned to baseline and stable Postop Assessment: no signs of nausea or vomiting Anesthetic complications: no    Last Vitals:             [name]

## 2015-08-07 ENCOUNTER — Other Ambulatory Visit: Payer: Self-pay | Admitting: Vascular Surgery

## 2015-08-07 DIAGNOSIS — M7541 Impingement syndrome of right shoulder: Secondary | ICD-10-CM | POA: Diagnosis not present

## 2015-08-07 DIAGNOSIS — Z48812 Encounter for surgical aftercare following surgery on the circulatory system: Secondary | ICD-10-CM | POA: Diagnosis not present

## 2015-08-07 DIAGNOSIS — Z01812 Encounter for preprocedural laboratory examination: Secondary | ICD-10-CM | POA: Diagnosis not present

## 2015-08-07 LAB — CREATININE, SERUM: Creat: 0.92 mg/dL (ref 0.70–1.33)

## 2015-08-10 ENCOUNTER — Ambulatory Visit
Admission: RE | Admit: 2015-08-10 | Discharge: 2015-08-10 | Disposition: A | Discharge status: Home / Self Care | Payer: Commercial Managed Care - HMO | Source: Ambulatory Visit | Attending: Vascular Surgery | Admitting: Vascular Surgery

## 2015-08-10 DIAGNOSIS — Z48812 Encounter for surgical aftercare following surgery on the circulatory system: Secondary | ICD-10-CM

## 2015-08-10 DIAGNOSIS — I714 Abdominal aortic aneurysm, without rupture, unspecified: Secondary | ICD-10-CM

## 2015-08-10 DIAGNOSIS — M7541 Impingement syndrome of right shoulder: Secondary | ICD-10-CM | POA: Diagnosis not present

## 2015-08-10 MED ORDER — IOPAMIDOL (ISOVUE-370) INJECTION 76%
75.0000 mL | Freq: Once | INTRAVENOUS | Status: AC | PRN
  Administered 2015-08-10: 75 mL via INTRAVENOUS

## 2015-08-14 ENCOUNTER — Encounter: Payer: Self-pay | Admitting: Vascular Surgery

## 2015-08-15 DIAGNOSIS — M7541 Impingement syndrome of right shoulder: Secondary | ICD-10-CM | POA: Diagnosis not present

## 2015-08-17 ENCOUNTER — Ambulatory Visit: Payer: Commercial Managed Care - HMO | Admitting: Vascular Surgery

## 2015-08-17 ENCOUNTER — Ambulatory Visit (INDEPENDENT_AMBULATORY_CARE_PROVIDER_SITE_OTHER): Payer: Commercial Managed Care - HMO | Admitting: Vascular Surgery

## 2015-08-17 ENCOUNTER — Encounter: Payer: Self-pay | Admitting: Vascular Surgery

## 2015-08-17 VITALS — BP 128/70 | HR 61 | Temp 97.4°F | Resp 18 | Ht 71.0 in | Wt 220.4 lb

## 2015-08-17 DIAGNOSIS — I714 Abdominal aortic aneurysm, without rupture, unspecified: Secondary | ICD-10-CM

## 2015-08-17 DIAGNOSIS — M7541 Impingement syndrome of right shoulder: Secondary | ICD-10-CM | POA: Diagnosis not present

## 2015-08-17 NOTE — Progress Notes (Signed)
History of Present Illness  Christian Mccall is a 60 y.o. (03-02-1956) male who presents for follow up s/p EVAR (Date: 01/2011).   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 He underwent injection of a persistent type II endoleak from a lumbar artery by interventional radiology November 2016 .  The patient has not had any new back or abdominal pain. He is a former smoker (quit 15 years ago). He takes plavix and aspirin.    He occasionally has some left upper quadrant abdominal pain. He has been noted in the past to have an enlarged spleen. He recently underwent a right shoulder arthroscopy by Dr. Onnie Graham.   Review of systems: He denies chest pain. He denies shortness of breath.  Past Medical History  Diagnosis Date  . Hypertension   . Hyperlipidemia   . AAA (abdominal aortic aneurysm) ([place])   . CAD (coronary artery disease)   . Leg pain     with walking and pain in feet while lying flat  . Shortness of breath 12/06/10    while lying flat and with exertion  . GERD (gastroesophageal reflux disease)   . Enlarged prostate   . Restless leg syndrome   . Memory loss     Due to hardening of the arteries in the brain  . Snoring   . Anginal pain ([place])   . COPD (chronic obstructive pulmonary disease) ([place])   . Emphysema lung ([place])   . Type II diabetes mellitus ([place]) dx'd 2009  . Headache     "~ weekly" (04/12/2015)  . TIA (transient ischemic attack) "several"    "since 2015" (04/12/2015)  . Stroke [facility]) 2004    denies residual on 04/12/2015  . Chronic back pain   . Bulging lumbar disc     "& some in my neck"  . Diabetic peripheral neuropathy ([place])   . Myocardial infarction ([place])     "I've had probably 6-7" (04/12/2015)  . Heart attack [facility]) Nov. 15,2016    TIA  . Enlarged liver   . Spleen enlarged   . Change in platelet count     pt states 'has been low since 2012; patient had to receive platelets in surgery in Nov 2016'  . Hardening of the arteries of the brain   . Thrombocytopenia ([place])    since 2012; can be as low as 54K.       Physical Examination   General: A&O x 3, WDWN obese male in NAD   Abdomen: Soft nontender no pulsatile mass   Extremities: 2+ femoral pulses absent pedal pulses bilaterally  Data:     Type II endoleak resolved with Onyx injection.  Aneurysm diameter today was 5.9 cm. Is essentially unchanged from 5.8 cm previously. Again noted was cirrhotic type changes of the liver and enlargement of the spleen   Assessment: stable status post Gore Excluder stent graft repair continued monitoring of aneurysm diameter with ultrasound in 6 months time. He will follow-up with [place] again in 6 months time.  Probable cirrhosis with splenic enlargement and thrombocytopenia   Plan: Follow-up with [place] with ultrasound in 6 months time. As far as the cirrhosis and spleen changes are concerned the patient has been followed in the past by Dr. Virgina Jock. I will  forward the CT to him. The patient also has follow-up scheduled with Dr. Paulita Fujita at some point in the near future and I will also forward my notes from today to him as well regarding his cirrhosis.  Ruta Hinds, MD [facility] Office: [phone] Pager: [phone]

## 2015-08-21 NOTE — Addendum Note (Signed)
Addended by: Mena Goes on: 08/21/2015 04:22 PM   Modules accepted: Orders

## 2015-08-22 DIAGNOSIS — M7541 Impingement syndrome of right shoulder: Secondary | ICD-10-CM | POA: Diagnosis not present

## 2015-08-24 DIAGNOSIS — M7541 Impingement syndrome of right shoulder: Secondary | ICD-10-CM | POA: Diagnosis not present

## 2015-08-31 DIAGNOSIS — M7541 Impingement syndrome of right shoulder: Secondary | ICD-10-CM | POA: Diagnosis not present

## 2015-09-04 DIAGNOSIS — M25511 Pain in right shoulder: Secondary | ICD-10-CM | POA: Diagnosis not present

## 2015-09-04 DIAGNOSIS — G8929 Other chronic pain: Secondary | ICD-10-CM | POA: Diagnosis not present

## 2015-09-05 DIAGNOSIS — M7541 Impingement syndrome of right shoulder: Secondary | ICD-10-CM | POA: Diagnosis not present

## 2015-09-07 DIAGNOSIS — M7541 Impingement syndrome of right shoulder: Secondary | ICD-10-CM | POA: Diagnosis not present

## 2015-09-12 DIAGNOSIS — M7541 Impingement syndrome of right shoulder: Secondary | ICD-10-CM | POA: Diagnosis not present

## 2015-09-14 DIAGNOSIS — M7541 Impingement syndrome of right shoulder: Secondary | ICD-10-CM | POA: Diagnosis not present

## 2015-09-19 DIAGNOSIS — M7541 Impingement syndrome of right shoulder: Secondary | ICD-10-CM | POA: Diagnosis not present

## 2015-09-21 DIAGNOSIS — M7541 Impingement syndrome of right shoulder: Secondary | ICD-10-CM | POA: Diagnosis not present

## 2015-09-26 DIAGNOSIS — M25511 Pain in right shoulder: Secondary | ICD-10-CM | POA: Diagnosis not present

## 2015-09-26 DIAGNOSIS — G8929 Other chronic pain: Secondary | ICD-10-CM | POA: Diagnosis not present

## 2015-09-27 DIAGNOSIS — H04123 Dry eye syndrome of bilateral lacrimal glands: Secondary | ICD-10-CM | POA: Diagnosis not present

## 2015-09-27 DIAGNOSIS — D3190 Benign neoplasm of unspecified part of unspecified eye: Secondary | ICD-10-CM | POA: Diagnosis not present

## 2015-09-27 DIAGNOSIS — H538 Other visual disturbances: Secondary | ICD-10-CM | POA: Diagnosis not present

## 2015-09-27 DIAGNOSIS — H40013 Open angle with borderline findings, low risk, bilateral: Secondary | ICD-10-CM | POA: Diagnosis not present

## 2015-09-27 DIAGNOSIS — H5710 Ocular pain, unspecified eye: Secondary | ICD-10-CM | POA: Diagnosis not present

## 2015-10-01 ENCOUNTER — Emergency Department (HOSPITAL_COMMUNITY)
Admission: EM | Admit: 2015-10-01 | Discharge: 2015-10-02 | Disposition: A | Discharge status: Home / Self Care | Payer: Commercial Managed Care - HMO | Attending: Emergency Medicine | Admitting: Emergency Medicine

## 2015-10-01 ENCOUNTER — Encounter (HOSPITAL_COMMUNITY): Payer: Self-pay | Admitting: Emergency Medicine

## 2015-10-01 DIAGNOSIS — G8929 Other chronic pain: Secondary | ICD-10-CM | POA: Diagnosis not present

## 2015-10-01 DIAGNOSIS — Z9889 Other specified postprocedural states: Secondary | ICD-10-CM | POA: Diagnosis not present

## 2015-10-01 DIAGNOSIS — Z7951 Long term (current) use of inhaled steroids: Secondary | ICD-10-CM | POA: Insufficient documentation

## 2015-10-01 DIAGNOSIS — Z7984 Long term (current) use of oral hypoglycemic drugs: Secondary | ICD-10-CM | POA: Diagnosis not present

## 2015-10-01 DIAGNOSIS — Z9861 Coronary angioplasty status: Secondary | ICD-10-CM | POA: Diagnosis not present

## 2015-10-01 DIAGNOSIS — R1013 Epigastric pain: Secondary | ICD-10-CM | POA: Diagnosis not present

## 2015-10-01 DIAGNOSIS — Z7902 Long term (current) use of antithrombotics/antiplatelets: Secondary | ICD-10-CM | POA: Insufficient documentation

## 2015-10-01 DIAGNOSIS — N400 Enlarged prostate without lower urinary tract symptoms: Secondary | ICD-10-CM | POA: Diagnosis not present

## 2015-10-01 DIAGNOSIS — Z951 Presence of aortocoronary bypass graft: Secondary | ICD-10-CM | POA: Insufficient documentation

## 2015-10-01 DIAGNOSIS — I252 Old myocardial infarction: Secondary | ICD-10-CM | POA: Diagnosis not present

## 2015-10-01 DIAGNOSIS — Z7982 Long term (current) use of aspirin: Secondary | ICD-10-CM | POA: Insufficient documentation

## 2015-10-01 DIAGNOSIS — G2581 Restless legs syndrome: Secondary | ICD-10-CM | POA: Insufficient documentation

## 2015-10-01 DIAGNOSIS — D696 Thrombocytopenia, unspecified: Secondary | ICD-10-CM

## 2015-10-01 DIAGNOSIS — R112 Nausea with vomiting, unspecified: Secondary | ICD-10-CM | POA: Diagnosis not present

## 2015-10-01 DIAGNOSIS — I10 Essential (primary) hypertension: Secondary | ICD-10-CM | POA: Insufficient documentation

## 2015-10-01 DIAGNOSIS — E1140 Type 2 diabetes mellitus with diabetic neuropathy, unspecified: Secondary | ICD-10-CM | POA: Diagnosis not present

## 2015-10-01 DIAGNOSIS — J441 Chronic obstructive pulmonary disease with (acute) exacerbation: Secondary | ICD-10-CM | POA: Insufficient documentation

## 2015-10-01 DIAGNOSIS — Z79899 Other long term (current) drug therapy: Secondary | ICD-10-CM | POA: Diagnosis not present

## 2015-10-01 DIAGNOSIS — I25119 Atherosclerotic heart disease of native coronary artery with unspecified angina pectoris: Secondary | ICD-10-CM | POA: Diagnosis not present

## 2015-10-01 DIAGNOSIS — Z87891 Personal history of nicotine dependence: Secondary | ICD-10-CM | POA: Diagnosis not present

## 2015-10-01 DIAGNOSIS — Z8739 Personal history of other diseases of the musculoskeletal system and connective tissue: Secondary | ICD-10-CM | POA: Insufficient documentation

## 2015-10-01 DIAGNOSIS — Z8673 Personal history of transient ischemic attack (TIA), and cerebral infarction without residual deficits: Secondary | ICD-10-CM | POA: Insufficient documentation

## 2015-10-01 DIAGNOSIS — R0602 Shortness of breath: Secondary | ICD-10-CM | POA: Diagnosis not present

## 2015-10-01 DIAGNOSIS — R197 Diarrhea, unspecified: Secondary | ICD-10-CM | POA: Insufficient documentation

## 2015-10-01 DIAGNOSIS — E785 Hyperlipidemia, unspecified: Secondary | ICD-10-CM | POA: Insufficient documentation

## 2015-10-01 DIAGNOSIS — K219 Gastro-esophageal reflux disease without esophagitis: Secondary | ICD-10-CM | POA: Insufficient documentation

## 2015-10-01 LAB — COMPREHENSIVE METABOLIC PANEL
ALK PHOS: 71 U/L (ref 38–126)
ALT: 50 U/L (ref 17–63)
ANION GAP: 17 — AB (ref 5–15)
AST: 41 U/L (ref 15–41)
Albumin: 4.6 g/dL (ref 3.5–5.0)
BILIRUBIN TOTAL: 2.2 mg/dL — AB (ref 0.3–1.2)
BUN: 16 mg/dL (ref 6–20)
CALCIUM: 10 mg/dL (ref 8.9–10.3)
CO2: 22 mmol/L (ref 22–32)
CREATININE: 1.19 mg/dL (ref 0.61–1.24)
Chloride: 98 mmol/L — ABNORMAL LOW (ref 101–111)
Glucose, Bld: 219 mg/dL — ABNORMAL HIGH (ref 65–99)
Potassium: 4.1 mmol/L (ref 3.5–5.1)
SODIUM: 137 mmol/L (ref 135–145)
TOTAL PROTEIN: 8.3 g/dL — AB (ref 6.5–8.1)

## 2015-10-01 LAB — URINALYSIS, ROUTINE W REFLEX MICROSCOPIC
Bilirubin Urine: NEGATIVE
Glucose, UA: >1000 mg/dL — AB
Hgb urine dipstick: NEGATIVE
Ketones, ur: 15 mg/dL — AB
Leukocytes, UA: NEGATIVE
NITRITE: NEGATIVE
PROTEIN: NEGATIVE mg/dL
SPECIFIC GRAVITY, URINE: 1.02 (ref 1.005–1.030)
pH: 5.5 (ref 5.0–8.0)

## 2015-10-01 LAB — CBC
HCT: 51.1 % (ref 39.0–52.0)
HEMOGLOBIN: 17.3 g/dL — AB (ref 13.0–17.0)
MCH: 30.6 pg (ref 26.0–34.0)
MCHC: 33.9 g/dL (ref 30.0–36.0)
MCV: 90.4 fL (ref 78.0–100.0)
PLATELETS: 126 10*3/uL — AB (ref 150–400)
RBC: 5.65 MIL/uL (ref 4.22–5.81)
RDW: 14.2 % (ref 11.5–15.5)
WBC: 12.2 10*3/uL — AB (ref 4.0–10.5)

## 2015-10-01 LAB — LIPASE, BLOOD: Lipase: 25 U/L (ref 11–51)

## 2015-10-01 LAB — URINE MICROSCOPIC-ADD ON

## 2015-10-01 LAB — CBG MONITORING, ED: Glucose-Capillary: 240 mg/dL — ABNORMAL HIGH (ref 65–99)

## 2015-10-01 MED ORDER — ONDANSETRON 4 MG PO TBDP: 8.0000 mg | ORAL_TABLET | Freq: Once | ORAL | Status: DC

## 2015-10-01 MED ORDER — SODIUM CHLORIDE 0.9 % IV SOLN
1000.0000 mL | INTRAVENOUS | Status: DC
  Administered 2015-10-02: 1000 mL via INTRAVENOUS

## 2015-10-01 MED ORDER — ONDANSETRON HCL 4 MG/2ML IJ SOLN
4.0000 mg | Freq: Once | INTRAMUSCULAR | Status: AC | PRN
  Administered 2015-10-01: 4 mg via INTRAVENOUS
  Filled 2015-10-01: qty 2

## 2015-10-01 MED ORDER — SODIUM CHLORIDE 0.9 % IV SOLN
1000.0000 mL | Freq: Once | INTRAVENOUS | Status: AC
  Administered 2015-10-01: 1000 mL via INTRAVENOUS

## 2015-10-01 NOTE — ED Provider Notes (Signed)
CSN: [id number]     Arrival date & time 10/01/15  2116 History  By signing my name below, I, Evelene Croon, attest that this documentation has been prepared under the direction and in the presence of Delora Fuel, MD . Electronically Signed: Evelene Croon, Scribe. 10/01/2015. 11:42 PM.    Chief Complaint  Patient presents with  . Emesis  . Diarrhea    The history is provided by the patient. No language interpreter was used.     HPI Comments:  Christian Mccall is a 60 y.o. male with an extensive PMHx including CAD, COPD, DM, and AAA repair who presents to the Emergency Department complaining of nausea since yesterday with vomiting and 1 episode of diarrhea today. Pt states the vomiting began today ~ 1600 and he has had ~10 episodes. He reports associated epigastric abdominal pain which occurs prior to vomiting and occasional SOB. He denies recent sick contacts. No alleviating factors noted.  PCP- Virgina Jock   Past Medical History  Diagnosis Date  . Hypertension   . Hyperlipidemia   . AAA (abdominal aortic aneurysm) ([place])   . CAD (coronary artery disease)   . Leg pain     with walking and pain in feet while lying flat  . Shortness of breath 12/06/10    while lying flat and with exertion  . GERD (gastroesophageal reflux disease)   . Enlarged prostate   . Restless leg syndrome   . Memory loss     Due to hardening of the arteries in the brain  . Snoring   . Anginal pain ([place])   . COPD (chronic obstructive pulmonary disease) ([place])   . Emphysema lung ([place])   . Type II diabetes mellitus ([place]) dx'd 2009  . Headache     "~ weekly" (04/12/2015)  . TIA (transient ischemic attack) "several"    "since 2015" (04/12/2015)  . Stroke [facility]) 2004    denies residual on 04/12/2015  . Chronic back pain   . Bulging lumbar disc     "& some in my neck"  . Diabetic peripheral neuropathy ([place])   . Myocardial infarction ([place])     "I've had probably 6-7" (04/12/2015)  . Heart attack [facility]) Nov. 15,2016    TIA   . Enlarged liver   . Spleen enlarged   . Change in platelet count     pt states 'has been low since 2012; patient had to receive platelets in surgery in Nov 2016'  . Hardening of the arteries of the brain   . Thrombocytopenia ([place])     since 2012; can be as low as 54K.    Past Surgical History  Procedure Laterality Date  . Abdominal aortic aneurysm repair  01-16-11    EVAR  . Peripheral vascular catheterization N/A 03/10/2015    Procedure: Abdominal Aortogram;  Surgeon: Elam Dutch, MD;  Location: [place] [facility];  Service: Cardiovascular;  Laterality: N/A;  . Cardiac catheterization  "several"  . Coronary angioplasty with stent placement  "several"    > 4 sents  . Colonoscopy    . Coronary artery bypass graft  02/18/2001    "CABG x 4"  . Colonoscopy w/ biopsies    . Abdominal aortic aneurysm repair  04/12/2015  . Tonsillectomy  ~ 1965  . Direct sac puncture with liquid embolic repair of type ii endoleak  04/12/2015  . Radiology with anesthesia N/A 04/12/2015    Procedure: embolization;  Surgeon: Jacqulynn Cadet, MD;  Location: [place];  Service:  Radiology;  Laterality: N/A;  . Shoulder surgery Right 07-27-2015   Family History  Problem Relation Age of Onset  . Aneurysm Mother     AAA  . Heart disease Mother     Heart Disease before age 39  . Other Mother     Carotid artery stenosis  . Hyperlipidemia Mother   . Hypertension Mother   . Aneurysm Maternal Aunt     AAA  . Other Maternal Aunt     AAA  . Aneurysm Maternal Uncle     AAA  . Other Maternal Uncle     AAA  . Lupus Sister   . Cancer Sister     Ovarian  . Heart disease Sister   . Heart disease Brother     Heart Disease before age 46  . Hypertension Brother   . Heart attack Brother   . Heart disease Brother     Before age 75  . Heart attack Brother   . Heart attack Brother   . Hyperlipidemia Father   . Hypertension Father    Social History  Substance Use Topics  . Smoking status: Former Smoker --  0.00 packs/day for 30 years    Types: Cigarettes    Quit date: 02/17/2001  . Smokeless tobacco: Never Used  . Alcohol Use: Yes    Review of Systems  Respiratory: Positive for shortness of breath.   Gastrointestinal: Positive for nausea, vomiting, abdominal pain and diarrhea.  All other systems reviewed and are negative.   Allergies  Review of patient's allergies indicates no known allergies.  Home Medications   Prior to Admission medications   Medication Sig Start Date End Date Taking? Authorizing Provider  albuterol (PROVENTIL HFA;VENTOLIN HFA) 108 (90 BASE) MCG/ACT inhaler Inhale 2 puffs into the lungs daily as needed for wheezing or shortness of breath.   Yes Historical Provider, MD  aspirin EC 81 MG tablet Take 1 tablet (81 mg total) by mouth daily. 07/12/15  Yes Belva Crome, MD  cholecalciferol (VITAMIN D) 1000 UNITS tablet Take 1,000 Units by mouth daily.   Yes Historical Provider, MD  clonazePAM (KLONOPIN) 1 MG tablet Take 1 tablet (1 mg total) by mouth 2 (two) times daily. Patient taking differently: Take 1 mg by mouth at bedtime.  07/15/14  Yes Asencion Partridge Dohmeier, MD  clopidogrel (PLAVIX) 75 MG tablet Take 75 mg by mouth daily.     Yes Historical Provider, MD  cyanocobalamin (,VITAMIN B-12,) 1000 MCG/ML injection Inject 1,000 mcg into the muscle See admin instructions. Every 30 to 60 days   Yes Historical Provider, MD  empagliflozin (JARDIANCE) 10 MG TABS tablet Take 10 mg by mouth daily.   Yes Historical Provider, MD  escitalopram (LEXAPRO) 20 MG tablet Take 20 mg by mouth at bedtime.   Yes Historical Provider, MD  ezetimibe-simvastatin (VYTORIN) 10-40 MG per tablet Take 1 tablet by mouth daily.    Yes Historical Provider, MD  fenofibrate 160 MG tablet Take 160 mg by mouth daily.  11/03/10  Yes Historical Provider, MD  Fluticasone-Salmeterol (ADVAIR) 100-50 MCG/DOSE AEPB Inhale 1 puff into the lungs daily.   Yes Historical Provider, MD  furosemide (LASIX) 40 MG tablet Take 40 mg  by mouth every other day.    Yes Historical Provider, MD  glimepiride (AMARYL) 4 MG tablet Take 4 mg by mouth at bedtime.    Yes Historical Provider, MD  HYDROcodone-acetaminophen (NORCO/VICODIN) 5-325 MG tablet Take 1 tablet by mouth 2 (two) times daily as needed for moderate  pain.  08/12/15  Yes Historical Provider, MD  lansoprazole (PREVACID) 30 MG capsule Take 30 mg by mouth daily.  03/11/14  Yes Historical Provider, MD  losartan (COZAAR) 50 MG tablet Take 50 mg by mouth daily.     Yes Historical Provider, MD  metFORMIN (GLUCOPHAGE) 1000 MG tablet Take 1,000 mg by mouth 2 (two) times daily. Breakfast and at bedtime   Yes Historical Provider, MD  methocarbamol (ROBAXIN) 500 MG tablet Take 1 tablet (500 mg total) by mouth every 8 (eight) hours as needed for muscle spasms. 07/27/15  Yes Tracy Shuford, PA-C  metoprolol (LOPRESSOR) 50 MG tablet Take 50 mg by mouth 2 (two) times daily.    Yes Historical Provider, MD  nitroGLYCERIN (NITROSTAT) 0.4 MG SL tablet Place 0.4 mg under the tongue every 5 (five) minutes as needed for chest pain.   Yes Historical Provider, MD  pramipexole (MIRAPEX) 0.25 MG tablet Take 0.25 mg by mouth at bedtime.  03/24/12  Yes Historical Provider, MD  pregabalin (LYRICA) 100 MG capsule Take 100-200 mg by mouth 2 (two) times daily. Take 1 tablet (100 mg) by mouth every morning and 2 tablets (200 mg) at bedtime   Yes Historical Provider, MD  ranolazine (RANEXA) 1000 MG SR tablet Take 1,000 mg by mouth 2 (two) times daily.     Yes Historical Provider, MD  terazosin (HYTRIN) 1 MG capsule Take 1 mg by mouth daily.    Yes Historical Provider, MD  tiotropium (SPIRIVA) 18 MCG inhalation capsule Place 18 mcg into inhaler and inhale daily.    Yes Historical Provider, MD   BP 128/65 mmHg  Pulse 95  Temp(Src) 100 F (37.8 C) (Oral)  Resp 27  SpO2 95% Physical Exam  Constitutional: He is oriented to person, place, and time. He appears well-developed and well-nourished. No distress.   HENT:  Head: Normocephalic and atraumatic.  Eyes: Conjunctivae and EOM are normal. Pupils are equal, round, and reactive to light.  Neck: Normal range of motion. Neck supple. No JVD present.  Cardiovascular: Normal rate, regular rhythm and normal heart sounds.   No murmur heard. Pulmonary/Chest: Effort normal and breath sounds normal. He has no wheezes. He has no rales. He exhibits no tenderness.  Abdominal: Soft. He exhibits no distension and no mass. Bowel sounds are decreased. There is no tenderness.  Musculoskeletal: Normal range of motion. He exhibits no edema.  Lymphadenopathy:    He has no cervical adenopathy.  Neurological: He is alert and oriented to person, place, and time. No cranial nerve deficit. He exhibits normal muscle tone. Coordination normal.  Skin: Skin is warm and dry. No rash noted.  Psychiatric: He has a normal mood and affect. His behavior is normal. Judgment and thought content normal.  Nursing note and vitals reviewed.   ED Course  Procedures   DIAGNOSTIC STUDIES:  Oxygen Saturation is 95% on [place], adequate by my interpretation.    COORDINATION OF CARE:  11:17 PM Pt and wife updated with partial results. Will order fluids and Zofran. Discussed treatment plan with pt at bedside and pt agreed to plan.  Labs Review Results for orders placed or performed during the hospital encounter of 10/01/15  Lipase, blood  Result Value Ref Range   Lipase 25 11 - 51 U/L  Comprehensive metabolic panel  Result Value Ref Range   Sodium 137 135 - 145 mmol/L   Potassium 4.1 3.5 - 5.1 mmol/L   Chloride 98 (L) 101 - 111 mmol/L   CO2 22 22 -  32 mmol/L   Glucose, Bld 219 (H) 65 - 99 mg/dL   BUN 16 6 - 20 mg/dL   Creatinine, Ser 1.19 0.61 - 1.24 mg/dL   Calcium 10.0 8.9 - 10.3 mg/dL   Total Protein 8.3 (H) 6.5 - 8.1 g/dL   Albumin 4.6 3.5 - 5.0 g/dL   AST 41 15 - 41 U/L   ALT 50 17 - 63 U/L   Alkaline Phosphatase 71 38 - 126 U/L   Total Bilirubin 2.2 (H) 0.3 - 1.2 mg/dL    GFR calc non Af Amer >60 >60 mL/min   GFR calc Af Amer >60 >60 mL/min   Anion gap 17 (H) 5 - 15  CBC  Result Value Ref Range   WBC 12.2 (H) 4.0 - 10.5 K/uL   RBC 5.65 4.22 - 5.81 MIL/uL   Hemoglobin 17.3 (H) 13.0 - 17.0 g/dL   HCT 51.1 39.0 - 52.0 %   MCV 90.4 78.0 - 100.0 fL   MCH 30.6 26.0 - 34.0 pg   MCHC 33.9 30.0 - 36.0 g/dL   RDW 14.2 11.5 - 15.5 %   Platelets 126 (L) 150 - 400 K/uL  Urinalysis, Routine w reflex microscopic (not at [facility])  Result Value Ref Range   Color, Urine YELLOW YELLOW   APPearance CLEAR CLEAR   Specific Gravity, Urine 1.020 1.005 - 1.030   pH 5.5 5.0 - 8.0   Glucose, UA >1000 (A) NEGATIVE mg/dL   Hgb urine dipstick NEGATIVE NEGATIVE   Bilirubin Urine NEGATIVE NEGATIVE   Ketones, ur 15 (A) NEGATIVE mg/dL   Protein, ur NEGATIVE NEGATIVE mg/dL   Nitrite NEGATIVE NEGATIVE   Leukocytes, UA NEGATIVE NEGATIVE  Urine microscopic-add on  Result Value Ref Range   Squamous Epithelial / LPF 0-5 (A) NONE SEEN   WBC, UA 0-5 0 - 5 WBC/hpf   RBC / HPF 0-5 0 - 5 RBC/hpf   Bacteria, UA RARE (A) NONE SEEN  CBG monitoring, ED  Result Value Ref Range   Glucose-Capillary 240 (H) 65 - 99 mg/dL   I have personally reviewed and evaluated these images and lab results as part of my medical decision-making.   EKG Interpretation   Date/Time:  Sunday October 01 2015 21:39:23 EDT Ventricular Rate:  101 PR Interval:  140 QRS Duration: 82 QT Interval:  346 QTC Calculation: 448 R Axis:   7 Text Interpretation:  Sinus tachycardia Nonspecific ST abnormality  Abnormal ECG When compared with ECG of 04/04/2015, HEART RATE has  increased Confirmed by [facility]  MD, Laylana Gerwig ([id number]) on 10/01/2015 11:07:57 PM      MDM   Final diagnoses:  Nausea vomiting and diarrhea  Thrombocytopenia (HCC)    Nausea, vomiting, diarrhea. Pattern strongly suggestive of viral gastroenteritis. Old records are reviewed and he is known to have an abdominal aortic aneurysm with repair, and chronic  from cytopenia. No symptoms to suggest endovascular leak from aneurysm repair. No signs or symptoms to suggest a more serious intra-abdominal pathology. He is given IV fluids and IV ondansetron. Unfortunate, he did vomit after that. He was given IV metoclopramide and has not vomited following this. He does have mild from cytopenia which is axial improved over baseline. He is discharged with prescription for metoclopramide.  I personally performed the services described in this documentation, which was scribed in my presence. The recorded information has been reviewed and is accurate.      Delora Fuel, MD [id number]

## 2015-10-01 NOTE — ED Notes (Signed)
Called into room by wife for c/o sob.  O2 sat 96% on my arrival.  O2 placed at 2L for comfort.  Now c/o nausea.  Vomiting at this time.

## 2015-10-01 NOTE — ED Notes (Signed)
Pt. reports emesis and diarrhea with mild left abdominal pain onset this afternoon and low grade fever .

## 2015-10-02 MED ORDER — METOCLOPRAMIDE HCL 10 MG PO TABS: 10.0000 mg | ORAL_TABLET | Freq: Four times a day (QID) | ORAL | Status: DC | PRN

## 2015-10-02 MED ORDER — METOCLOPRAMIDE HCL 5 MG/ML IJ SOLN
10.0000 mg | Freq: Once | INTRAMUSCULAR | Status: AC
  Administered 2015-10-02: 10 mg via INTRAVENOUS
  Filled 2015-10-02: qty 2

## 2015-10-02 MED ORDER — DIPHENHYDRAMINE HCL 50 MG/ML IJ SOLN
25.0000 mg | Freq: Once | INTRAMUSCULAR | Status: AC
  Administered 2015-10-02: 25 mg via INTRAVENOUS
  Filled 2015-10-02: qty 1

## 2015-10-02 NOTE — Discharge Instructions (Signed)
Take loperamide (Imodium AD) as needed for diarrhea. ° °Nausea and Vomiting °Nausea is a sick feeling that often comes before throwing up (vomiting). Vomiting is a reflex where stomach contents come out of your mouth. Vomiting can cause severe loss of body fluids (dehydration). Children and elderly adults can become dehydrated quickly, especially if they also have diarrhea. Nausea and vomiting are symptoms of a condition or disease. It is important to find the cause of your symptoms. °CAUSES  °· Direct irritation of the stomach lining. This irritation can result from increased acid production (gastroesophageal reflux disease), infection, food poisoning, taking certain medicines (such as nonsteroidal anti-inflammatory drugs), alcohol use, or tobacco use. °· Signals from the brain. These signals could be caused by a headache, heat exposure, an inner ear disturbance, increased pressure in the brain from injury, infection, a tumor, or a concussion, pain, emotional stimulus, or metabolic problems. °· An obstruction in the gastrointestinal tract (bowel obstruction). °· Illnesses such as diabetes, hepatitis, gallbladder problems, appendicitis, kidney problems, cancer, sepsis, atypical symptoms of a heart attack, or eating disorders. °· Medical treatments such as chemotherapy and radiation. °· Receiving medicine that makes you sleep (general anesthetic) during surgery. °DIAGNOSIS °Your caregiver may ask for tests to be done if the problems do not improve after a few days. Tests may also be done if symptoms are severe or if the reason for the nausea and vomiting is not clear. Tests may include: °· Urine tests. °· Blood tests. °· Stool tests. °· Cultures (to look for evidence of infection). °· X-rays or other imaging studies. °Test results can help your caregiver make decisions about treatment or the need for additional tests. °TREATMENT °You need to stay well hydrated. Drink frequently but in small amounts. You may wish to  drink water, sports drinks, clear broth, or eat frozen ice pops or gelatin dessert to help stay hydrated. When you eat, eating slowly may help prevent nausea. There are also some antinausea medicines that may help prevent nausea. °HOME CARE INSTRUCTIONS  °· Take all medicine as directed by your caregiver. °· If you do not have an appetite, do not force yourself to eat. However, you must continue to drink fluids. °· If you have an appetite, eat a normal diet unless your caregiver tells you differently. °· Eat a variety of complex carbohydrates (rice, wheat, potatoes, bread), lean meats, yogurt, fruits, and vegetables. °· Avoid high-fat foods because they are more difficult to digest. °· Drink enough water and fluids to keep your urine clear or pale yellow. °· If you are dehydrated, ask your caregiver for specific rehydration instructions. Signs of dehydration may include: °· Severe thirst. °· Dry lips and mouth. °· Dizziness. °· Dark urine. °· Decreasing urine frequency and amount. °· Confusion. °· Rapid breathing or pulse. °SEEK IMMEDIATE MEDICAL CARE IF:  °· You have blood or brown flecks (like coffee grounds) in your vomit. °· You have black or bloody stools. °· You have a severe headache or stiff neck. °· You are confused. °· You have severe abdominal pain. °· You have chest pain or trouble breathing. °· You do not urinate at least once every 8 hours. °· You develop cold or clammy skin. °· You continue to vomit for longer than 24 to 48 hours. °· You have a fever. °MAKE SURE YOU:  °· Understand these instructions. °· Will watch your condition. °· Will get help right away if you are not doing well or get worse. °  °This information is not intended to replace advice given   to you by your health care provider. Make sure you discuss any questions you have with your health care provider.   Document Released: 05/27/2005 Document Revised: 08/19/2011 Document Reviewed: 10/24/2010 Elsevier Interactive Patient Education  2016 [employer].  Diarrhea Diarrhea is frequent loose and watery bowel movements. It can cause you to feel weak and dehydrated. Dehydration can cause you to become tired and thirsty, have a dry mouth, and have decreased urination that often is dark yellow. Diarrhea is a sign of another problem, most often an infection that will not last long. In most cases, diarrhea typically lasts 2-3 days. However, it can last longer if it is a sign of something more serious. It is important to treat your diarrhea as directed by your caregiver to lessen or prevent future episodes of diarrhea. CAUSES  Some common causes include:  Gastrointestinal infections caused by viruses, bacteria, or parasites.  Food poisoning or food allergies.  Certain medicines, such as antibiotics, chemotherapy, and laxatives.  Artificial sweeteners and fructose.  Digestive disorders. HOME CARE INSTRUCTIONS  Ensure adequate fluid intake (hydration): Have 1 cup (8 oz) of fluid for each diarrhea episode. Avoid fluids that contain simple sugars or sports drinks, fruit juices, whole milk products, and sodas. Your urine should be clear or pale yellow if you are drinking enough fluids. Hydrate with an oral rehydration solution that you can purchase at pharmacies, retail stores, and online. You can prepare an oral rehydration solution at home by mixing the following ingredients together:   - tsp table salt.   tsp baking soda.   tsp salt substitute containing potassium chloride.  1  tablespoons sugar.  1 L (34 oz) of water.  Certain foods and beverages may increase the speed at which food moves through the gastrointestinal (GI) tract. These foods and beverages should be avoided and include:  Caffeinated and alcoholic beverages.  High-fiber foods, such as raw fruits and vegetables, nuts, seeds, and whole grain breads and cereals.  Foods and beverages sweetened with sugar alcohols, such as xylitol, sorbitol, and  mannitol.  Some foods may be well tolerated and may help thicken stool including:  Starchy foods, such as rice, toast, pasta, low-sugar cereal, oatmeal, grits, baked potatoes, crackers, and bagels.  Bananas.  Applesauce.  Add probiotic-rich foods to help increase healthy bacteria in the GI tract, such as yogurt and fermented milk products.  Wash your hands well after each diarrhea episode.  Only take over-the-counter or prescription medicines as directed by your caregiver.  Take a warm bath to relieve any burning or pain from frequent diarrhea episodes. SEEK IMMEDIATE MEDICAL CARE IF:   You are unable to keep fluids down.  You have persistent vomiting.  You have blood in your stool, or your stools are black and tarry.  You do not urinate in 6-8 hours, or there is only a small amount of very dark urine.  You have abdominal pain that increases or localizes.  You have weakness, dizziness, confusion, or light-headedness.  You have a severe headache.  Your diarrhea gets worse or does not get better.  You have a fever or persistent symptoms for more than 2-3 days.  You have a fever and your symptoms suddenly get worse. MAKE SURE YOU:   Understand these instructions.  Will watch your condition.  Will get help right away if you are not doing well or get worse.   This information is not intended to replace advice given to you by your health care provider. Make sure you  discuss any questions you have with your health care provider.   Document Released: 05/17/2002 Document Revised: 06/17/2014 Document Reviewed: 02/02/2012 Elsevier Interactive Patient Education 2016 [employer]  Metoclopramide tablets What is this medicine? METOCLOPRAMIDE (met oh kloe PRA mide) is used to treat the symptoms of gastroesophageal reflux disease (GERD) like heartburn. It is also used to treat people with slow emptying of the stomach and intestinal tract. This medicine may be used for other  purposes; ask your health care provider or pharmacist if you have questions. What should I tell my health care provider before I take this medicine? They need to know if you have any of these conditions: -breast cancer -depression -diabetes -heart failure -high blood pressure -kidney disease -liver disease -Parkinson's disease or a movement disorder -pheochromocytoma -seizures -stomach obstruction, bleeding, or perforation -an unusual or allergic reaction to metoclopramide, procainamide, sulfites, other medicines, foods, dyes, or preservatives -pregnant or trying to get pregnant -breast-feeding How should I use this medicine? Take this medicine by mouth with a glass of water. Follow the directions on the prescription label. Take this medicine on an empty stomach, about 30 minutes before eating. Take your doses at regular intervals. Do not take your medicine more often than directed. Do not stop taking except on the advice of your doctor or health care professional. A special MedGuide will be given to you by the pharmacist with each prescription and refill. Be sure to read this information carefully each time. Talk to your pediatrician regarding the use of this medicine in children. Special care may be needed. Overdosage: If you think you have taken too much of this medicine contact a poison control center or emergency room at once. NOTE: This medicine is only for you. Do not share this medicine with others. What if I miss a dose? If you miss a dose, take it as soon as you can. If it is almost time for your next dose, take only that dose. Do not take double or extra doses. What may interact with this medicine? -acetaminophen -cyclosporine -digoxin -medicines for blood pressure -medicines for diabetes, including insulin -medicines for hay fever and other allergies -medicines for depression, especially an Monoamine Oxidase Inhibitor (MAOI) -medicines for Parkinson's disease, like  levodopa -medicines for sleep or for pain -tetracycline This list may not describe all possible interactions. Give your health care provider a list of all the medicines, herbs, non-prescription drugs, or dietary supplements you use. Also tell them if you smoke, drink alcohol, or use illegal drugs. Some items may interact with your medicine. What should I watch for while using this medicine? It may take a few weeks for your stomach condition to start to get better. However, do not take this medicine for longer than 12 weeks. The longer you take this medicine, and the more you take it, the greater your chances are of developing serious side effects. If you are an elderly patient, a male patient, or you have diabetes, you may be at an increased risk for side effects from this medicine. Contact your doctor immediately if you start having movements you cannot control such as lip smacking, rapid movements of the tongue, involuntary or uncontrollable movements of the eyes, head, arms and legs, or muscle twitches and spasms. Patients and their families should watch out for worsening depression or thoughts of suicide. Also watch out for any sudden or severe changes in feelings such as feeling anxious, agitated, panicky, irritable, hostile, aggressive, impulsive, severely restless, overly excited and hyperactive, or not  being able to sleep. If this happens, especially at the beginning of treatment or after a change in dose, call your doctor. Do not treat yourself for high fever. Ask your doctor or health care professional for advice. You may get drowsy or dizzy. Do not drive, use machinery, or do anything that needs mental alertness until you know how this drug affects you. Do not stand or sit up quickly, especially if you are an older patient. This reduces the risk of dizzy or fainting spells. Alcohol can make you more drowsy and dizzy. Avoid alcoholic drinks. What side effects may I notice from receiving this  medicine? Side effects that you should report to your doctor or health care professional as soon as possible: -allergic reactions like skin rash, itching or hives, swelling of the face, lips, or tongue -abnormal production of milk in females -breast enlargement in both males and females -change in the way you walk -difficulty moving, speaking or swallowing -drooling, lip smacking, or rapid movements of the tongue -excessive sweating -fever -involuntary or uncontrollable movements of the eyes, head, arms and legs -irregular heartbeat or palpitations -muscle twitches and spasms -unusually weak or tired Side effects that usually do not require medical attention (report to your doctor or health care professional if they continue or are bothersome): -change in sex drive or performance -depressed mood -diarrhea -difficulty sleeping -headache -menstrual changes -restless or nervous This list may not describe all possible side effects. Call your doctor for medical advice about side effects. You may report side effects to FDA at 1-800-FDA-1088. Where should I keep my medicine? Keep out of the reach of children. Store at room temperature between 20 and 25 degrees C (68 and 77 degrees F). Protect from light. Keep container tightly closed. Throw away any unused medicine after the expiration date. NOTE: This sheet is a summary. It may not cover all possible information. If you have questions about this medicine, talk to your doctor, pharmacist, or health care provider.    2016, Elsevier/[employer]. (2011-09-24 13:04:38)

## 2015-10-05 DIAGNOSIS — E668 Other obesity: Secondary | ICD-10-CM | POA: Diagnosis not present

## 2015-10-05 DIAGNOSIS — Z6827 Body mass index (BMI) 27.0-27.9, adult: Secondary | ICD-10-CM | POA: Diagnosis not present

## 2015-10-05 DIAGNOSIS — D518 Other vitamin B12 deficiency anemias: Secondary | ICD-10-CM | POA: Diagnosis not present

## 2015-10-05 DIAGNOSIS — E1151 Type 2 diabetes mellitus with diabetic peripheral angiopathy without gangrene: Secondary | ICD-10-CM | POA: Diagnosis not present

## 2015-10-05 DIAGNOSIS — D692 Other nonthrombocytopenic purpura: Secondary | ICD-10-CM | POA: Diagnosis not present

## 2015-10-05 DIAGNOSIS — K760 Fatty (change of) liver, not elsewhere classified: Secondary | ICD-10-CM | POA: Diagnosis not present

## 2015-10-05 DIAGNOSIS — I119 Hypertensive heart disease without heart failure: Secondary | ICD-10-CM | POA: Diagnosis not present

## 2015-10-05 DIAGNOSIS — R197 Diarrhea, unspecified: Secondary | ICD-10-CM | POA: Diagnosis not present

## 2015-10-05 DIAGNOSIS — I714 Abdominal aortic aneurysm, without rupture: Secondary | ICD-10-CM | POA: Diagnosis not present

## 2015-10-20 ENCOUNTER — Other Ambulatory Visit (HOSPITAL_BASED_OUTPATIENT_CLINIC_OR_DEPARTMENT_OTHER): Payer: Commercial Managed Care - HMO

## 2015-10-20 ENCOUNTER — Ambulatory Visit (HOSPITAL_BASED_OUTPATIENT_CLINIC_OR_DEPARTMENT_OTHER): Payer: Commercial Managed Care - HMO | Admitting: Oncology

## 2015-10-20 ENCOUNTER — Telehealth: Payer: Self-pay | Admitting: Oncology

## 2015-10-20 VITALS — BP 124/63 | HR 59 | Temp 98.3°F | Resp 20 | Ht 71.0 in | Wt 213.1 lb

## 2015-10-20 DIAGNOSIS — D696 Thrombocytopenia, unspecified: Secondary | ICD-10-CM

## 2015-10-20 LAB — CBC WITH DIFFERENTIAL/PLATELET
BASO%: 0.5 % (ref 0.0–2.0)
Basophils Absolute: 0 10*3/uL (ref 0.0–0.1)
EOS ABS: 0.2 10*3/uL (ref 0.0–0.5)
EOS%: 2.7 % (ref 0.0–7.0)
HEMATOCRIT: 42.3 % (ref 38.4–49.9)
HGB: 14.4 g/dL (ref 13.0–17.1)
LYMPH%: 36.6 % (ref 14.0–49.0)
MCH: 30.8 pg (ref 27.2–33.4)
MCHC: 34 g/dL (ref 32.0–36.0)
MCV: 90.4 fL (ref 79.3–98.0)
MONO#: 0.6 10*3/uL (ref 0.1–0.9)
MONO%: 9.9 % (ref 0.0–14.0)
NEUT#: 3 10*3/uL (ref 1.5–6.5)
NEUT%: 50.3 % (ref 39.0–75.0)
PLATELETS: 88 10*3/uL — AB (ref 140–400)
RBC: 4.68 10*6/uL (ref 4.20–5.82)
RDW: 14.1 % (ref 11.0–14.6)
WBC: 5.9 10*3/uL (ref 4.0–10.3)
lymph#: 2.2 10*3/uL (ref 0.9–3.3)

## 2015-10-20 NOTE — Telephone Encounter (Signed)
per pof to sch pt appt-gave pt copy of avs °

## 2015-10-20 NOTE — Progress Notes (Signed)
Hematology and Oncology Follow Up Visit  Christian Mccall [id number] [birth year] 60 y.o. 10/20/2015 3:24 PM   Principle Diagnosis: 60 year old gentleman with chronic fluctuating thrombocytopenia. The etiology related to chronic liver disease Vs. ITP.   Current therapy: Observation and surveillance.  Interim History:  Christian Mccall presents today for a followup visit. Since the last visit, he under went shoulder surgery and tolerated well. No bleeding complications noted during or after. He did report improvement in his range of motion but not completely back to baseline.   He has not reported any hematochezia or melena. Has not reported any epistaxis or petechiae. Has not reported any constitutional symptoms of weight loss appetite changes or changes in his performance status.  He denied any headaches, blurry vision, syncope or seizures. He does not report any chest pain, palpitation or orthopnea. He does not report any cough, wheezing or hemoptysis. Does not report any nausea, vomiting or abdominal pain. Does not report any constipation or diarrhea. He does not report any frequency urgency or hesitancy. Remaining review of systems unremarkable.  Medications: I have reviewed the patient's current medications.  Current Outpatient Prescriptions  Medication Sig Dispense Refill  . albuterol (PROVENTIL HFA;VENTOLIN HFA) 108 (90 BASE) MCG/ACT inhaler Inhale 2 puffs into the lungs daily as needed for wheezing or shortness of breath.    [name] aspirin EC 81 MG tablet Take 1 tablet (81 mg total) by mouth daily.    . cholecalciferol (VITAMIN D) 1000 UNITS tablet Take 1,000 Units by mouth daily.    . clonazePAM (KLONOPIN) 1 MG tablet Take 1 tablet (1 mg total) by mouth 2 (two) times daily. (Patient taking differently: Take 1 mg by mouth at bedtime. ) 60 tablet 5  . clopidogrel (PLAVIX) 75 MG tablet Take 75 mg by mouth daily.      . cyanocobalamin (,VITAMIN B-12,) 1000 MCG/ML injection Inject 1,000 mcg into the  muscle See admin instructions. Every 30 to 60 days    . empagliflozin (JARDIANCE) 10 MG TABS tablet Take 10 mg by mouth daily.    [name] escitalopram (LEXAPRO) 20 MG tablet Take 20 mg by mouth at bedtime.    [name] ezetimibe-simvastatin (VYTORIN) 10-40 MG per tablet Take 1 tablet by mouth daily.     . fenofibrate 160 MG tablet Take 160 mg by mouth daily.     . Fluticasone-Salmeterol (ADVAIR) 100-50 MCG/DOSE AEPB Inhale 1 puff into the lungs daily.    . furosemide (LASIX) 40 MG tablet Take 40 mg by mouth every other day.     [name] glimepiride (AMARYL) 4 MG tablet Take 4 mg by mouth at bedtime.     . lansoprazole (PREVACID) 30 MG capsule Take 30 mg by mouth daily.   3  . losartan (COZAAR) 50 MG tablet Take 50 mg by mouth daily.      . metFORMIN (GLUCOPHAGE) 1000 MG tablet Take 1,000 mg by mouth 2 (two) times daily. Breakfast and at bedtime    . methocarbamol (ROBAXIN) 500 MG tablet Take 1 tablet (500 mg total) by mouth every 8 (eight) hours as needed for muscle spasms. 30 tablet 1  . metoprolol (LOPRESSOR) 50 MG tablet Take 50 mg by mouth 2 (two) times daily.     . nitroGLYCERIN (NITROSTAT) 0.4 MG SL tablet Place 0.4 mg under the tongue every 5 (five) minutes as needed for chest pain.    . pramipexole (MIRAPEX) 0.25 MG tablet Take 0.25 mg by mouth at bedtime.     . pregabalin (LYRICA) 100  MG capsule Take 100-200 mg by mouth 2 (two) times daily. Take 1 tablet (100 mg) by mouth every morning and 2 tablets (200 mg) at bedtime    . ranolazine (RANEXA) 1000 MG SR tablet Take 1,000 mg by mouth 2 (two) times daily.      [name] terazosin (HYTRIN) 1 MG capsule Take 1 mg by mouth daily.     [name] tiotropium (SPIRIVA) 18 MCG inhalation capsule Place 18 mcg into inhaler and inhale daily.      No current facility-administered medications for this visit.     Allergies: No Known Allergies  Past Medical History, Surgical history, Social history, and Family History were reviewed and updated.  Physical Exam: Blood pressure  124/63, pulse 59, temperature 98.3 F (36.8 C), temperature source Oral, resp. rate 20, height 5\' 11"  (1.803 m), weight 213 lb 1.6 oz (96.662 kg), SpO2 98 %. ECOG: 1 General appearance: alert and cooperative  Head: Normocephalic, without obvious abnormality No oral ulcer or bleeding.  Neck: no adenopathy Lymph nodes: Cervical, supraclavicular, and axillary nodes normal. Heart:regular rate and rhythm, S1, S2 normal, no murmur, click, rub or gallop Lung: Clear to auscultation without wheezes or dullness to percussion.  Abdomin: soft, non-tender, without masses or organomegaly no shifting dullness or ascites.  EXT:no erythema, induration, or nodules   Lab Results: Lab Results  Component Value Date   WBC 5.9 10/20/2015   HGB 14.4 10/20/2015   HCT 42.3 10/20/2015   MCV 90.4 10/20/2015   PLT 88* 10/20/2015     Chemistry      Component Value Date/Time   NA 137 10/01/2015 2202   NA 137 05/05/2013 1347   K 4.1 10/01/2015 2202   K 4.6 05/05/2013 1347   CL 98* 10/01/2015 2202   CO2 22 10/01/2015 2202   CO2 22 05/05/2013 1347   BUN 16 10/01/2015 2202   BUN 12.9 05/05/2013 1347   CREATININE 1.19 10/01/2015 2202   CREATININE 0.92 08/07/2015 1131   CREATININE 1.2 05/05/2013 1347      Component Value Date/Time   CALCIUM 10.0 10/01/2015 2202   CALCIUM 10.2 05/05/2013 1347   ALKPHOS 71 10/01/2015 2202   ALKPHOS 69 05/05/2013 1347   AST 41 10/01/2015 2202   AST 33 05/05/2013 1347   ALT 50 10/01/2015 2202   ALT 32 05/05/2013 1347   BILITOT 2.2* 10/01/2015 2202   BILITOT 0.68 05/05/2013 1347        Impression and Plan:  60 year old gentleman with the following issues:  1. Thrombocytopenia: the etiology is related to to liver disease and splenomegaly versus autoimmune thrombocytopenia. His platelet count today is 68 without any spontaneous bleeding.  I have recommended continued surveillance for now without intervention. His risk of bleeding is low unless he has to have surgery  or platalet drops below 30,000.  2. Possible cirrhosis of the liver: he fellow with Dr. Virgina Jock regarding this issue.   3. Follow-up: Will be in 6 months.   Zola Button, MD 5/12/20173:24 PM

## 2015-11-09 ENCOUNTER — Other Ambulatory Visit (HOSPITAL_COMMUNITY): Payer: Self-pay | Admitting: Interventional Radiology

## 2015-11-09 DIAGNOSIS — I714 Abdominal aortic aneurysm, without rupture, unspecified: Secondary | ICD-10-CM

## 2015-11-09 DIAGNOSIS — T82330A Leakage of aortic (bifurcation) graft (replacement), initial encounter: Secondary | ICD-10-CM

## 2015-11-09 DIAGNOSIS — IMO0002 Reserved for concepts with insufficient information to code with codable children: Secondary | ICD-10-CM

## 2016-01-04 DIAGNOSIS — I208 Other forms of angina pectoris: Secondary | ICD-10-CM | POA: Diagnosis not present

## 2016-01-04 DIAGNOSIS — D692 Other nonthrombocytopenic purpura: Secondary | ICD-10-CM | POA: Diagnosis not present

## 2016-01-04 DIAGNOSIS — I119 Hypertensive heart disease without heart failure: Secondary | ICD-10-CM | POA: Diagnosis not present

## 2016-01-04 DIAGNOSIS — R413 Other amnesia: Secondary | ICD-10-CM | POA: Diagnosis not present

## 2016-01-04 DIAGNOSIS — E668 Other obesity: Secondary | ICD-10-CM | POA: Diagnosis not present

## 2016-01-04 DIAGNOSIS — I7389 Other specified peripheral vascular diseases: Secondary | ICD-10-CM | POA: Diagnosis not present

## 2016-01-04 DIAGNOSIS — E1151 Type 2 diabetes mellitus with diabetic peripheral angiopathy without gangrene: Secondary | ICD-10-CM | POA: Diagnosis not present

## 2016-01-04 DIAGNOSIS — D696 Thrombocytopenia, unspecified: Secondary | ICD-10-CM | POA: Diagnosis not present

## 2016-01-04 DIAGNOSIS — I2510 Atherosclerotic heart disease of native coronary artery without angina pectoris: Secondary | ICD-10-CM | POA: Diagnosis not present

## 2016-02-16 ENCOUNTER — Emergency Department (HOSPITAL_COMMUNITY): Payer: Commercial Managed Care - HMO

## 2016-02-16 ENCOUNTER — Emergency Department (HOSPITAL_COMMUNITY)
Admission: EM | Admit: 2016-02-16 | Discharge: 2016-02-16 | Disposition: A | Discharge status: Home / Self Care | Payer: Commercial Managed Care - HMO | Attending: Emergency Medicine | Admitting: Emergency Medicine

## 2016-02-16 ENCOUNTER — Telehealth: Payer: Self-pay

## 2016-02-16 ENCOUNTER — Encounter (HOSPITAL_COMMUNITY): Payer: Self-pay | Admitting: *Deleted

## 2016-02-16 DIAGNOSIS — I2510 Atherosclerotic heart disease of native coronary artery without angina pectoris: Secondary | ICD-10-CM | POA: Diagnosis not present

## 2016-02-16 DIAGNOSIS — R1084 Generalized abdominal pain: Secondary | ICD-10-CM | POA: Insufficient documentation

## 2016-02-16 DIAGNOSIS — I252 Old myocardial infarction: Secondary | ICD-10-CM | POA: Insufficient documentation

## 2016-02-16 DIAGNOSIS — E1140 Type 2 diabetes mellitus with diabetic neuropathy, unspecified: Secondary | ICD-10-CM | POA: Insufficient documentation

## 2016-02-16 DIAGNOSIS — Z955 Presence of coronary angioplasty implant and graft: Secondary | ICD-10-CM | POA: Diagnosis not present

## 2016-02-16 DIAGNOSIS — Z8673 Personal history of transient ischemic attack (TIA), and cerebral infarction without residual deficits: Secondary | ICD-10-CM | POA: Insufficient documentation

## 2016-02-16 DIAGNOSIS — Z951 Presence of aortocoronary bypass graft: Secondary | ICD-10-CM | POA: Insufficient documentation

## 2016-02-16 DIAGNOSIS — R079 Chest pain, unspecified: Secondary | ICD-10-CM | POA: Diagnosis not present

## 2016-02-16 DIAGNOSIS — R109 Unspecified abdominal pain: Secondary | ICD-10-CM | POA: Diagnosis not present

## 2016-02-16 DIAGNOSIS — Z7984 Long term (current) use of oral hypoglycemic drugs: Secondary | ICD-10-CM | POA: Diagnosis not present

## 2016-02-16 DIAGNOSIS — I10 Essential (primary) hypertension: Secondary | ICD-10-CM | POA: Diagnosis not present

## 2016-02-16 DIAGNOSIS — Z87891 Personal history of nicotine dependence: Secondary | ICD-10-CM | POA: Diagnosis not present

## 2016-02-16 DIAGNOSIS — Z7982 Long term (current) use of aspirin: Secondary | ICD-10-CM | POA: Insufficient documentation

## 2016-02-16 DIAGNOSIS — J449 Chronic obstructive pulmonary disease, unspecified: Secondary | ICD-10-CM | POA: Insufficient documentation

## 2016-02-16 DIAGNOSIS — M545 Low back pain: Secondary | ICD-10-CM | POA: Diagnosis not present

## 2016-02-16 DIAGNOSIS — Z7902 Long term (current) use of antithrombotics/antiplatelets: Secondary | ICD-10-CM | POA: Insufficient documentation

## 2016-02-16 LAB — SAMPLE TO BLOOD BANK

## 2016-02-16 LAB — BASIC METABOLIC PANEL
ANION GAP: 11 (ref 5–15)
BUN: 13 mg/dL (ref 6–20)
CHLORIDE: 102 mmol/L (ref 101–111)
CO2: 23 mmol/L (ref 22–32)
Calcium: 10.1 mg/dL (ref 8.9–10.3)
Creatinine, Ser: 0.95 mg/dL (ref 0.61–1.24)
GFR calc non Af Amer: >60 mL/min (ref >60)
GLUCOSE: 158 mg/dL — AB (ref 65–99)
Potassium: 4.1 mmol/L (ref 3.5–5.1)
Sodium: 136 mmol/L (ref 135–145)

## 2016-02-16 LAB — PROTIME-INR
INR: 1.1
Prothrombin Time: 14.2 seconds (ref 11.4–15.2)

## 2016-02-16 LAB — CBC
HEMATOCRIT: 48 % (ref 39.0–52.0)
Hemoglobin: 16.2 g/dL (ref 13.0–17.0)
MCH: 30.1 pg (ref 26.0–34.0)
MCHC: 33.8 g/dL (ref 30.0–36.0)
MCV: 89.2 fL (ref 78.0–100.0)
Platelets: 118 10*3/uL — ABNORMAL LOW (ref 150–400)
RBC: 5.38 MIL/uL (ref 4.22–5.81)
RDW: 13.6 % (ref 11.5–15.5)
WBC: 9.3 10*3/uL (ref 4.0–10.5)

## 2016-02-16 LAB — TROPONIN I: Troponin I: <0.03 ng/mL (ref <0.03)

## 2016-02-16 MED ORDER — ONDANSETRON HCL 4 MG/2ML IJ SOLN
4.0000 mg | INTRAMUSCULAR | Status: DC | PRN
  Administered 2016-02-16: 4 mg via INTRAVENOUS
  Filled 2016-02-16: qty 2

## 2016-02-16 MED ORDER — MORPHINE SULFATE (PF) 4 MG/ML IV SOLN
4.0000 mg | INTRAVENOUS | Status: DC | PRN
  Administered 2016-02-16: 4 mg via INTRAVENOUS
  Filled 2016-02-16: qty 1

## 2016-02-16 MED ORDER — OXYCODONE-ACETAMINOPHEN 5-325 MG PO TABS: ORAL_TABLET | ORAL | 0 refills | Status: DC

## 2016-02-16 MED ORDER — IOPAMIDOL (ISOVUE-370) INJECTION 76%
INTRAVENOUS | Status: AC
  Administered 2016-02-16: 100 mL
  Filled 2016-02-16: qty 100

## 2016-02-16 NOTE — ED Notes (Signed)
Pt stable, ambulatory, states understanding of discharge instructions 

## 2016-02-16 NOTE — Discharge Instructions (Signed)
Take the prescription as directed.  Apply moist heat or ice to the area(s) of discomfort, for 15 minutes at a time, several times per day for the next few days.  Do not fall asleep on a heating or ice pack.  Call your regular medical doctor on Monday to schedule a follow up appointment in the next 3 days.  Return to the Emergency Department immediately if worsening. ° °

## 2016-02-16 NOTE — ED Provider Notes (Signed)
[facility] DEPT Provider Note   CSN: [id number] Arrival date & time: 02/16/16  1651     History   Chief Complaint Chief Complaint  Patient presents with  . Abdominal Pain    HPI Christian Mccall is a 60 y.o. male.  HPI  Pt was seen at [facility]. Per pt, c/o gradual onset and persistence of constant abd pain, back pain, and chest "pain" that began 6 days ago. Pt states he was "standing next to my truck" when he "just felt like something exploded in my abd." Pt states he has had constant pain in his abd, back and chest since that time. Pain worsens with certain positions. Pt's wife called pt's Vasc MD today to verify an upcoming routine CT for AAA follow up, when he was told to come to the ED for evaluation. Pt denies any change in his symptoms over the past 6 days. Denies any new symptoms. Denies palpitations, no SOB/cough, no N/V/D, no fevers, no rash, no focal motor weakness, no tingling/numbness in extremities.    Past Medical History:  Diagnosis Date  . AAA (abdominal aortic aneurysm) ([place])   . Anginal pain ([place])   . Bulging lumbar disc    "& some in my neck"  . CAD (coronary artery disease)   . Change in platelet count    pt states 'has been low since 2012; patient had to receive platelets in surgery in Nov 2016'  . Chronic back pain   . COPD (chronic obstructive pulmonary disease) ([place])   . Diabetic peripheral neuropathy ([place])   . Emphysema lung ([place])   . Enlarged liver   . Enlarged prostate   . GERD (gastroesophageal reflux disease)   . Hardening of the arteries of the brain   . Headache    "~ weekly" (04/12/2015)  . Heart attack [facility]) Nov. 15,2016   TIA  . Hyperlipidemia   . Hypertension   . Leg pain    with walking and pain in feet while lying flat  . Memory loss    Due to hardening of the arteries in the brain  . Myocardial infarction ([place])    "I've had probably 6-7" (04/12/2015)  . Restless leg syndrome   . Shortness of breath 12/06/10   while lying flat and  with exertion  . Snoring   . Spleen enlarged   . Stroke [facility]) 2004   denies residual on 04/12/2015  . Thrombocytopenia ([place])    since 2012; can be as low as 54K.   [name] TIA (transient ischemic attack) "several"   "since 2015" (04/12/2015)  . Type II diabetes mellitus ([place]) dx'd 2009    Patient Active Problem List   Diagnosis Date Noted  . CAD (coronary artery disease) of artery bypass graft 07/12/2015  . Abdominal aortic aneurysm (AAA) without rupture ([place])   . Endoleak of aortic graft ([place])   . Soreness-type pain-Abdomin 02/10/2014  . Genital herpes 12/03/2013  . Balanitis 11/30/2013  . Syphilis, late latent 11/24/2013  . Mild cognitive impairment 11/16/2013  . Other and unspecified hyperlipidemia 05/03/2013  . Essential hypertension, benign 05/03/2013  . Weakness of limb-Left >than right 02/04/2013  . Shortness of breath 02/04/2013  . Pain in limb-Bilat leg 02/04/2013  . Hemiplegia affecting nondominant side ([place]) 11/12/2012  . Restless legs syndrome (RLS) 11/12/2012  . Lumbar back pain 11/12/2012  . Transient left leg weakness 11/12/2012  . Back pain 11/12/2012  . Cervicalgia 11/12/2012  . Memory loss 11/12/2012    Past Surgical History:  Procedure Laterality Date  . ABDOMINAL AORTIC ANEURYSM REPAIR  01-16-11   EVAR  . ABDOMINAL AORTIC ANEURYSM REPAIR  04/12/2015  . CARDIAC CATHETERIZATION  "several"  . COLONOSCOPY    . COLONOSCOPY W/ BIOPSIES    . CORONARY ANGIOPLASTY WITH STENT PLACEMENT  "several"   > 4 sents  . CORONARY ARTERY BYPASS GRAFT  02/18/2001   "CABG x 4"  . Direct sac puncture with liquid embolic repair of Type II endoleak  04/12/2015  . PERIPHERAL VASCULAR CATHETERIZATION N/A 03/10/2015   Procedure: Abdominal Aortogram;  Surgeon: Elam Dutch, MD;  Location: [facility];  Service: Cardiovascular;  Laterality: N/A;  . RADIOLOGY WITH ANESTHESIA N/A 04/12/2015   Procedure: embolization;  Surgeon: Jacqulynn Cadet, MD;  Location: [facility];  Service:  Radiology;  Laterality: N/A;  . SHOULDER SURGERY Right 07-27-2015  . TONSILLECTOMY  ~ 1965       Home Medications    Prior to Admission medications   Medication Sig Start Date End Date Taking? Authorizing Provider  albuterol (PROVENTIL HFA;VENTOLIN HFA) 108 (90 BASE) MCG/ACT inhaler Inhale 2 puffs into the lungs daily as needed for wheezing or shortness of breath.    Historical Provider, MD  aspirin EC 81 MG tablet Take 1 tablet (81 mg total) by mouth daily. 07/12/15   Belva Crome, MD  cholecalciferol (VITAMIN D) 1000 UNITS tablet Take 1,000 Units by mouth daily.    Historical Provider, MD  clonazePAM (KLONOPIN) 1 MG tablet Take 1 tablet (1 mg total) by mouth 2 (two) times daily. Patient taking differently: Take 1 mg by mouth at bedtime.  07/15/14   Larey Seat, MD  clopidogrel (PLAVIX) 75 MG tablet Take 75 mg by mouth daily.      Historical Provider, MD  cyanocobalamin (,VITAMIN B-12,) 1000 MCG/ML injection Inject 1,000 mcg into the muscle See admin instructions. Every 30 to 60 days    Historical Provider, MD  empagliflozin (JARDIANCE) 10 MG TABS tablet Take 10 mg by mouth daily.    Historical Provider, MD  escitalopram (LEXAPRO) 20 MG tablet Take 20 mg by mouth at bedtime.    Historical Provider, MD  ezetimibe-simvastatin (VYTORIN) 10-40 MG per tablet Take 1 tablet by mouth daily.     Historical Provider, MD  fenofibrate 160 MG tablet Take 160 mg by mouth daily.  11/03/10   Historical Provider, MD  Fluticasone-Salmeterol (ADVAIR) 100-50 MCG/DOSE AEPB Inhale 1 puff into the lungs daily.    Historical Provider, MD  furosemide (LASIX) 40 MG tablet Take 40 mg by mouth every other day.     Historical Provider, MD  glimepiride (AMARYL) 4 MG tablet Take 4 mg by mouth at bedtime.     Historical Provider, MD  lansoprazole (PREVACID) 30 MG capsule Take 30 mg by mouth daily.  03/11/14   Historical Provider, MD  losartan (COZAAR) 50 MG tablet Take 50 mg by mouth daily.      Historical Provider, MD    metFORMIN (GLUCOPHAGE) 1000 MG tablet Take 1,000 mg by mouth 2 (two) times daily. Breakfast and at bedtime    Historical Provider, MD  methocarbamol (ROBAXIN) 500 MG tablet Take 1 tablet (500 mg total) by mouth every 8 (eight) hours as needed for muscle spasms. 07/27/15   Olivia Mackie Shuford, PA-C  metoprolol (LOPRESSOR) 50 MG tablet Take 50 mg by mouth 2 (two) times daily.     Historical Provider, MD  nitroGLYCERIN (NITROSTAT) 0.4 MG SL tablet Place 0.4 mg under the tongue every 5 (five)  minutes as needed for chest pain.    Historical Provider, MD  pramipexole (MIRAPEX) 0.25 MG tablet Take 0.25 mg by mouth at bedtime.  03/24/12   Historical Provider, MD  pregabalin (LYRICA) 100 MG capsule Take 100-200 mg by mouth 2 (two) times daily. Take 1 tablet (100 mg) by mouth every morning and 2 tablets (200 mg) at bedtime    Historical Provider, MD  ranolazine (RANEXA) 1000 MG SR tablet Take 1,000 mg by mouth 2 (two) times daily.      Historical Provider, MD  terazosin (HYTRIN) 1 MG capsule Take 1 mg by mouth daily.     Historical Provider, MD  tiotropium (SPIRIVA) 18 MCG inhalation capsule Place 18 mcg into inhaler and inhale daily.     Historical Provider, MD    Family History Family History  Problem Relation Age of Onset  . Aneurysm Mother     AAA  . Heart disease Mother     Heart Disease before age 54  . Other Mother     Carotid artery stenosis  . Hyperlipidemia Mother   . Hypertension Mother   . Lupus Sister   . Cancer Sister     Ovarian  . Heart disease Sister   . Heart disease Brother     Heart Disease before age 8  . Hypertension Brother   . Heart attack Brother   . Heart disease Brother     Before age 51  . Heart attack Brother   . Heart attack Brother   . Hyperlipidemia Father   . Hypertension Father   . Aneurysm Maternal Aunt     AAA  . Other Maternal Aunt     AAA  . Aneurysm Maternal Uncle     AAA  . Other Maternal Uncle     AAA    Social History Social History   Substance Use Topics  . Smoking status: Former Smoker    Packs/day: 0.00    Years: 30.00    Types: Cigarettes    Quit date: 02/17/2001  . Smokeless tobacco: Never Used  . Alcohol use Yes     Allergies   Review of patient's allergies indicates no known allergies.   Review of Systems Review of Systems ROS: Statement: All systems negative except as marked or noted in the HPI; Constitutional: Negative for fever and chills. ; ; Eyes: Negative for eye pain, redness and discharge. ; ; ENMT: Negative for ear pain, hoarseness, nasal congestion, sinus pressure and sore throat. ; ; Cardiovascular: Negative for palpitations, diaphoresis, dyspnea and peripheral edema. ; ; Respiratory: Negative for cough, wheezing and stridor. ; ; Gastrointestinal: Negative for nausea, vomiting, diarrhea, blood in stool, hematemesis, jaundice and rectal bleeding. . ; ; Genitourinary: Negative for dysuria, flank pain and hematuria. ; ; Musculoskeletal: +CP, abd pain, back pain. Negative for neck pain. Negative for swelling and trauma.; ; Skin: Negative for pruritus, rash, abrasions, blisters, bruising and skin lesion.; ; Neuro: Negative for headache, lightheadedness and neck stiffness. Negative for weakness, altered level of consciousness, altered mental status, extremity weakness, paresthesias, involuntary movement, seizure and syncope.       Physical Exam Updated Vital Signs BP 120/57   Pulse (!) 52   Temp 98.6 F (37 C) (Oral)   Resp 12   SpO2 96%   Physical Exam 1810: Physical examination:  Nursing notes reviewed; Vital signs and O2 SAT reviewed;  Constitutional: Well developed, Well nourished, Well hydrated, In no acute distress; Head:  Normocephalic, atraumatic; Eyes: EOMI, PERRL,  No scleral icterus; ENMT: Mouth and pharynx normal, Mucous membranes moist; Neck: Supple, Full range of motion, No lymphadenopathy; Cardiovascular: Regular rate and rhythm, No gallop; Respiratory: Breath sounds clear & equal  bilaterally, No wheezes.  Speaking full sentences with ease, Normal respiratory effort/excursion; Chest: Nontender, Movement normal; Abdomen: Soft, Nontender, Nondistended, Normal bowel sounds; Genitourinary: No CVA tenderness; Spine:  No midline CS, TS, LS tenderness.;; Extremities: Pulses normal, No tenderness, No edema, No calf edema or asymmetry.; Neuro: AA&Ox3, Major CN grossly intact.  Speech clear. No gross focal motor or sensory deficits in extremities. Climbs on and off stretcher easily by himself. Gait steady.; Skin: Color normal, Warm, Dry.   ED Treatments / Results  Labs (all labs ordered are listed, but only abnormal results are displayed)   EKG  EKG Interpretation  Date/Time:  Friday February 16 2016 17:06:13 EDT Ventricular Rate:  58 PR Interval:  154 QRS Duration: 86 QT Interval:  406 QTC Calculation: 398 R Axis:   3 Text Interpretation:  Sinus bradycardia Cannot rule out Anterior infarct , age undetermined When compared with ECG of 04/04/2015 No significant change was found Confirmed by [facility]  MD, Nunzio Cory [phone]) on 02/16/2016 6:24:12 PM       Radiology   Procedures Procedures (including critical care time)  Medications Ordered in ED Medications  morphine 4 MG/ML injection 4 mg (4 mg Intravenous Given 02/16/16 1844)  ondansetron (ZOFRAN) injection 4 mg (4 mg Intravenous Given 02/16/16 1844)  iopamidol (ISOVUE-370) 76 % injection (not administered)     Initial Impression / Assessment and Plan / ED Course  I have reviewed the triage vital signs and the nursing notes.  Pertinent labs & imaging results that were available during my care of the patient were reviewed by me and considered in my medical decision making (see chart for details).  MDM Reviewed: previous chart, nursing note and vitals Reviewed previous: labs and ECG Interpretation: labs, ECG, x-ray and CT scan   Results for orders placed or performed during the hospital encounter of [id number]  Basic  metabolic panel  Result Value Ref Range   Sodium 136 135 - 145 mmol/L   Potassium 4.1 3.5 - 5.1 mmol/L   Chloride 102 101 - 111 mmol/L   CO2 23 22 - 32 mmol/L   Glucose, Bld 158 (H) 65 - 99 mg/dL   BUN 13 6 - 20 mg/dL   Creatinine, Ser 0.95 0.61 - 1.24 mg/dL   Calcium 10.1 8.9 - 10.3 mg/dL   GFR calc non Af Amer >60 >60 mL/min   GFR calc Af Amer >60 >60 mL/min   Anion gap 11 5 - 15  CBC  Result Value Ref Range   WBC 9.3 4.0 - 10.5 K/uL   RBC 5.38 4.22 - 5.81 MIL/uL   Hemoglobin 16.2 13.0 - 17.0 g/dL   HCT 48.0 39.0 - 52.0 %   MCV 89.2 78.0 - 100.0 fL   MCH 30.1 26.0 - 34.0 pg   MCHC 33.8 30.0 - 36.0 g/dL   RDW 13.6 11.5 - 15.5 %   Platelets 118 (L) 150 - 400 K/uL  Troponin I  Result Value Ref Range   Troponin I <0.03 <0.03 ng/mL  Protime-INR (order if Patient is taking Coumadin / Warfarin)  Result Value Ref Range   Prothrombin Time 14.2 11.4 - 15.2 seconds   INR 1.10   Sample to Blood Bank  Result Value Ref Range   Blood Bank Specimen SAMPLE AVAILABLE FOR TESTING    Sample  Expiration 02/17/2016    Dg Chest 2 View Result Date: 02/16/2016 CLINICAL DATA:  60 y/o M; pain radiating all over chest and abdomen for 1 week. EXAM: CHEST  2 VIEW COMPARISON:  05/01/2013 chest radiograph. FINDINGS: Stable cardiomediastinal silhouette. Sternotomy wires are aligned and intact. Clear lungs. No pneumothorax. No pleural effusion. No acute osseous abnormality. IMPRESSION: No active cardiopulmonary disease. Electronically Signed   By: Kristine Garbe M.D.   On: 02/16/2016 18:02   Ct Angio Chest Aorta W And/or Wo Contrast Result Date: 02/16/2016 CLINICAL DATA:  Back pain and abdominal pain. History of stent graft repair of abdominal aortic aneurysm. EXAM: CT ANGIOGRAPHY CHEST, ABDOMEN AND PELVIS TECHNIQUE: Multidetector CT imaging through the chest, abdomen and pelvis was performed using the standard protocol during bolus administration of intravenous contrast. Multiplanar reconstructed  images and MIPs were obtained and reviewed to evaluate the vascular anatomy. CONTRAST:  100 cc of Isovue 370 COMPARISON:  08/10/2015 FINDINGS: CTA CHEST FINDINGS Mediastinum: The heart size is normal. No pericardial effusion. Previous median sternotomy and CABG procedure. The thoracic aorta has a normal caliber. No dissection. The main pulmonary artery is patent. No evidence for saddle embolus. The trachea appears patent and is midline. Normal appearance of the esophagus. Lungs/Pleura: There is no pleural effusion identified. There is no pneumothorax. No airspace consolidation or atelectasis identified. Musculoskeletal: No aggressive lytic or sclerotic bone lesions. Review of the MIP images confirms the above findings. CTA ABDOMEN AND PELVIS FINDINGS Hepatobiliary: The liver appears cirrhotic. No focal liver abnormality identified. The gallbladder appears normal. No biliary dilatation. Pancreas: No inflammation or mass identified. Spleen: The spleen is enlarged measuring 15 cm in length. Adrenals/Urinary Tract: Normal adrenal glands. Normal appearance of both kidneys. No hydronephrosis or mass. The urinary bladder appears normal. Stomach/Bowel: The stomach is within normal limits. The small bowel loops have a normal course and caliber. No obstruction. Normal appearance of the colon. Numerous distal colonic diverticula identified. There is no acute inflammation. Vascular/Lymphatic: Calcified atherosclerotic disease involves the abdominal aorta. The patient is status post stent graft repair off infrarenal abdominal aortic aneurysm. The aneurysm sac Measures 5.6 cm in AP dimension which is unchanged from 08/10/2015. No evidence for new endoleak status post previous endoleak repair. Prominent upper abdominal lymph nodes are identified which are nonspecific in the setting of cirrhosis. No pelvic or inguinal adenopathy. Reproductive: Prostate gland appears unremarkable. Other: There is no ascites or focal fluid  collections within the abdomen or pelvis. Musculoskeletal: No acute bone abnormality. No suspicious bone lesions noted. Review of the MIP images confirms the above findings. IMPRESSION: 1. No evidence for aortic dissection or rupture. 2. Stable appearance of the infrarenal abdominal aortic aneurysm status post stent graft repair. 3. Morphologic features of the liver compatible with cirrhosis with stigmata of portal venous hypertension. Electronically Signed   By: Kerby Moors M.D.   On: 02/16/2016 21:47   Ct Angio Abd/pel W And/or Wo Contrast Result Date: 02/16/2016 CLINICAL DATA:  Back pain and abdominal pain. History of stent graft repair of abdominal aortic aneurysm. EXAM: CT ANGIOGRAPHY CHEST, ABDOMEN AND PELVIS TECHNIQUE: Multidetector CT imaging through the chest, abdomen and pelvis was performed using the standard protocol during bolus administration of intravenous contrast. Multiplanar reconstructed images and MIPs were obtained and reviewed to evaluate the vascular anatomy. CONTRAST:  100 cc of Isovue 370 COMPARISON:  08/10/2015 FINDINGS: CTA CHEST FINDINGS Mediastinum: The heart size is normal. No pericardial effusion. Previous median sternotomy and CABG procedure. The thoracic aorta has a  normal caliber. No dissection. The main pulmonary artery is patent. No evidence for saddle embolus. The trachea appears patent and is midline. Normal appearance of the esophagus. Lungs/Pleura: There is no pleural effusion identified. There is no pneumothorax. No airspace consolidation or atelectasis identified. Musculoskeletal: No aggressive lytic or sclerotic bone lesions. Review of the MIP images confirms the above findings. CTA ABDOMEN AND PELVIS FINDINGS Hepatobiliary: The liver appears cirrhotic. No focal liver abnormality identified. The gallbladder appears normal. No biliary dilatation. Pancreas: No inflammation or mass identified. Spleen: The spleen is enlarged measuring 15 cm in length. Adrenals/Urinary  Tract: Normal adrenal glands. Normal appearance of both kidneys. No hydronephrosis or mass. The urinary bladder appears normal. Stomach/Bowel: The stomach is within normal limits. The small bowel loops have a normal course and caliber. No obstruction. Normal appearance of the colon. Numerous distal colonic diverticula identified. There is no acute inflammation. Vascular/Lymphatic: Calcified atherosclerotic disease involves the abdominal aorta. The patient is status post stent graft repair off infrarenal abdominal aortic aneurysm. The aneurysm sac Measures 5.6 cm in AP dimension which is unchanged from 08/10/2015. No evidence for new endoleak status post previous endoleak repair. Prominent upper abdominal lymph nodes are identified which are nonspecific in the setting of cirrhosis. No pelvic or inguinal adenopathy. Reproductive: Prostate gland appears unremarkable. Other: There is no ascites or focal fluid collections within the abdomen or pelvis. Musculoskeletal: No acute bone abnormality. No suspicious bone lesions noted. Review of the MIP images confirms the above findings. IMPRESSION: 1. No evidence for aortic dissection or rupture. 2. Stable appearance of the infrarenal abdominal aortic aneurysm status post stent graft repair. 3. Morphologic features of the liver compatible with cirrhosis with stigmata of portal venous hypertension. Electronically Signed   By: Kerby Moors M.D.   On: 02/16/2016 21:47    2200:  Pt and wife aware of cirrhosis and already have f/u with GI MD. Pt feels better after meds and would like to go home now. Has tol PO well while in the ED without N/V. Tx symptomatically at this time. Dx and testing d/w pt and family.  Questions answered.  Verb understanding, agreeable to d/c home with outpt f/u.     Final Clinical Impressions(s) / ED Diagnoses   Final diagnoses:  None    New Prescriptions New Prescriptions   No medications on file     Francine Graven, DO 02/20/16  1642

## 2016-02-16 NOTE — Telephone Encounter (Signed)
rec'd phone call from pt. and wife.  Reported c/o discomfort mid-abdomen, around (R) side, and into the back x 1 week.  Reported discomfort initially started with "feeling like something burst between ribs on right side, and just beneath rib cage."  Stated the pain increases with certain movements and the pain is much more intense with sneezing or coughing.  Reported that it hurts across his chest at times.  c/o chills over past 2 days; has not taken his temperature.  Advised to go to the ER due to hx of EVAR and treatment for type 2 endo leak.  Wife verb. Understanding.  Will make Dr. Donnetta Hutching aware of pt. coming to the ER.

## 2016-02-16 NOTE — ED Triage Notes (Signed)
The pt had sudden sharp pain in his back his abd and chest last Saturday.  He felt like something exploded in his abd.  Since then he has had pain in those areas.  He has a known aaa and he has a stent placed in the past for the same.

## 2016-02-17 ENCOUNTER — Encounter: Payer: Self-pay | Admitting: Vascular Surgery

## 2016-02-17 NOTE — Progress Notes (Signed)
Patient called our office on Friday afternoon reporting one-week history of abdominal and back pain. Was instructed to go to the emergency department for further evaluation. Vital signs and all blood work was stable. Patient underwent CT of his abdomen and pelvis showing excellent positioning of his stent graft in his abdominal aortic aneurysm with no evidence of leak. Reviewed the films with the radiologist. Did not see the patient in the emergency department. Was seen by the EDP and discharged to home. We'll continue his usual follow-up with Dr. Oneida Alar

## 2016-02-22 ENCOUNTER — Other Ambulatory Visit: Payer: Commercial Managed Care - HMO

## 2016-02-23 ENCOUNTER — Encounter: Payer: Self-pay | Admitting: Vascular Surgery

## 2016-02-29 ENCOUNTER — Encounter: Payer: Self-pay | Admitting: Vascular Surgery

## 2016-02-29 ENCOUNTER — Ambulatory Visit (INDEPENDENT_AMBULATORY_CARE_PROVIDER_SITE_OTHER): Payer: Commercial Managed Care - HMO | Admitting: Vascular Surgery

## 2016-02-29 ENCOUNTER — Other Ambulatory Visit: Payer: Self-pay | Admitting: Vascular Surgery

## 2016-02-29 VITALS — BP 120/69 | HR 52 | Ht 71.0 in | Wt 209.8 lb

## 2016-02-29 DIAGNOSIS — R1013 Epigastric pain: Secondary | ICD-10-CM | POA: Diagnosis not present

## 2016-02-29 DIAGNOSIS — I714 Abdominal aortic aneurysm, without rupture, unspecified: Secondary | ICD-10-CM

## 2016-02-29 DIAGNOSIS — D518 Other vitamin B12 deficiency anemias: Secondary | ICD-10-CM | POA: Diagnosis not present

## 2016-02-29 DIAGNOSIS — G8929 Other chronic pain: Secondary | ICD-10-CM | POA: Diagnosis not present

## 2016-02-29 DIAGNOSIS — R1010 Upper abdominal pain, unspecified: Secondary | ICD-10-CM

## 2016-02-29 DIAGNOSIS — R1011 Right upper quadrant pain: Secondary | ICD-10-CM

## 2016-02-29 DIAGNOSIS — R109 Unspecified abdominal pain: Secondary | ICD-10-CM | POA: Diagnosis not present

## 2016-02-29 NOTE — Progress Notes (Signed)
History of Present Illness   Christian Mccall is a 60 y.o. (Jan 18, 1956) male who presents for follow up s/p EVAR (Date: 01/2011).   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 He underwent injection of a persistent type II endoleak from a lumbar artery by interventional radiology November 2016 .   the patient had a CT scan a few weeks ago due to intermittent severe right upper quadrant right flank right lower abdominal pain. The pain is colicky in nature but does not really seem to be associated with any type of food. It lasts for 10-12 minutes before subsiding. He reports normal bowel movements. He's had no nausea or vomiting. He is a former smoker (quit 15 years ago). He takes plavix and aspirin.    He has been noted in the past to have an enlarged spleen and cirrhotic changes to his liver.     Review of systems: He denies chest pain. He denies shortness of breath.        Past Medical History  Diagnosis Date  . Hypertension    . Hyperlipidemia    . AAA (abdominal aortic aneurysm) ([place])    . CAD (coronary artery disease)    . Leg pain        with walking and pain in feet while lying flat  . Shortness of breath 12/06/10      while lying flat and with exertion  . GERD (gastroesophageal reflux disease)    . Enlarged prostate    . Restless leg syndrome    . Memory loss        Due to hardening of the arteries in the brain  . Snoring    . Anginal pain ([place])    . COPD (chronic obstructive pulmonary disease) ([place])    . Emphysema lung ([place])    . Type II diabetes mellitus ([place]) dx'd 2009  . Headache        "~ weekly" (04/12/2015)  . TIA (transient ischemic attack) "several"      "since 2015" (04/12/2015)  . Stroke [facility]) 2004      denies residual on 04/12/2015  . Chronic back pain    . Bulging lumbar disc        "& some in my neck"  . Diabetic peripheral neuropathy ([place])    . Myocardial infarction ([place])        "I've had probably 6-7" (04/12/2015)  . Heart attack [facility]) Nov. 15,2016      TIA  .  Enlarged liver    . Spleen enlarged    . Change in platelet count        pt states 'has been low since 2012; patient had to receive platelets in surgery in Nov 2016'  . Hardening of the arteries of the brain    . Thrombocytopenia ([place])        since 2012; can be as low as 54K.           Physical Examination  Vitals:   02/29/16 1023  BP: 120/69  Pulse: (!) 52  SpO2: 97%  Weight: 209 lb 12.8 oz (95.2 kg)  Height: 5\' 11"  (1.803 m)      General: A&O x 3, WDWN male in NAD    Abdomen: Soft nontender no pulsatile mass, no bony tenderness over the right costal margin. No costovertebral angle tenderness.    Extremities: 2+ femoral pulses absent pedal pulses bilaterally   Data:     CT  scan of the abdomen and pelvis for about 2 weeks ago is reviewed today. Type II endoleak resolved with Onyx injection.  Aneurysm diameter today was 5.6 cm. Is essentially unchanged from 5.6 cm previously over the past year. The patient's aneurysm was 6.2 cm in diameter preoperatively. Again noted was cirrhotic type changes of the liver and enlargement of the spleen    Assessment: Stable status post Gore Excluder stent graft repair continued monitoring of aneurysm diameter with ultrasound in 6 months time. He will follow-up with [place] again in 6 months time.  Probable cirrhosis with splenic enlargement and thrombocytopenia don't know if this is related to his right upper quadrant pain.    Plan: Follow-up with AAA with ultrasound in 6 months time. As far as the cirrhosis and spleen changes are concerned the patient has been followed in the past by Dr. Virgina Jock. He also has follow-up scheduled in the near future with Dr. Paulita Fujita.  I have ordered a right upper quadrant ultrasound to evaluate his gallbladder further today as well as a urinalysis to make sure he does not have any urinary tract symptoms.    Ruta Hinds, MD [facility] Office: [phone] Pager: [phone]

## 2016-03-01 ENCOUNTER — Telehealth: Payer: Self-pay | Admitting: Vascular Surgery

## 2016-03-01 ENCOUNTER — Ambulatory Visit
Admission: RE | Admit: 2016-03-01 | Discharge: 2016-03-01 | Disposition: A | Discharge status: Home / Self Care | Payer: Commercial Managed Care - HMO | Source: Ambulatory Visit | Attending: Vascular Surgery | Admitting: Vascular Surgery

## 2016-03-01 DIAGNOSIS — R1013 Epigastric pain: Secondary | ICD-10-CM

## 2016-03-01 DIAGNOSIS — R1011 Right upper quadrant pain: Secondary | ICD-10-CM | POA: Diagnosis not present

## 2016-03-01 LAB — URINALYSIS W MICROSCOPIC + REFLEX CULTURE
BACTERIA UA: NONE SEEN [HPF]
BILIRUBIN URINE: NEGATIVE
Casts: NONE SEEN [LPF]
Crystals: NONE SEEN [HPF]
Hgb urine dipstick: NEGATIVE
Ketones, ur: NEGATIVE
LEUKOCYTES UA: NEGATIVE
NITRITE: NEGATIVE
PH: 5 (ref 5.0–8.0)
PROTEIN: NEGATIVE
RBC / HPF: NONE SEEN RBC/HPF (ref <=2)
Specific Gravity, Urine: 1.034 (ref 1.001–1.035)
Squamous Epithelial / LPF: NONE SEEN [HPF] (ref <=5)
WBC UA: NONE SEEN WBC/HPF (ref <=5)
Yeast: NONE SEEN [HPF]

## 2016-03-01 NOTE — Telephone Encounter (Signed)
Called pt to discuss results of [place].  Gallbladder was normal Urine showed glucose otherwise normal  Answering machine.  Left message to call our office for results  Ruta Hinds, MD [facility] [place] [facility] Office: [phone] Pager: [phone]

## 2016-03-04 DIAGNOSIS — K7460 Unspecified cirrhosis of liver: Secondary | ICD-10-CM | POA: Diagnosis not present

## 2016-03-04 DIAGNOSIS — Z86010 Personal history of colonic polyps: Secondary | ICD-10-CM | POA: Diagnosis not present

## 2016-03-04 NOTE — Telephone Encounter (Addendum)
Today Pt's wife Reva and called and left voice msg wanting results of [place].  She missed Dr. Nona Dell phone call last Friday.   I tried calling her back with results but there was no answer and patient's voice mail is not set up. Ashleigh E., LPN  Pt's wife called back and results of the [place] were relayed to her.  She verbalizes understanding and will inform the patient.  Ashleigh E. LPN.

## 2016-03-13 ENCOUNTER — Telehealth: Payer: Self-pay | Admitting: Interventional Cardiology

## 2016-03-13 NOTE — Telephone Encounter (Signed)
It is okay to hold Plavix up to 7 days prior to colonoscopy. Resume Plavix when felt safe.

## 2016-03-13 NOTE — Telephone Encounter (Signed)
Will forward to Dr. Erlinda Hong office.

## 2016-03-13 NOTE — Telephone Encounter (Signed)
Received clearance form from Dr. Paulita Fujita with [facility].  Pt is having a colonoscopy/endoscopy the week of Oct 9th.  They are doing this for cirrhosis of the liver and personal history of polyps.  They are asking about pt holding Plavix.  They would like to know a stop date and a restart date (if polyps removed).  Will forward to Dr. Tamala Julian for review and advisement.

## 2016-03-18 ENCOUNTER — Other Ambulatory Visit: Payer: Self-pay | Admitting: Gastroenterology

## 2016-03-19 DIAGNOSIS — H25013 Cortical age-related cataract, bilateral: Secondary | ICD-10-CM | POA: Diagnosis not present

## 2016-03-19 DIAGNOSIS — H2513 Age-related nuclear cataract, bilateral: Secondary | ICD-10-CM | POA: Diagnosis not present

## 2016-03-19 DIAGNOSIS — E119 Type 2 diabetes mellitus without complications: Secondary | ICD-10-CM | POA: Diagnosis not present

## 2016-03-19 DIAGNOSIS — H40013 Open angle with borderline findings, low risk, bilateral: Secondary | ICD-10-CM | POA: Diagnosis not present

## 2016-03-20 ENCOUNTER — Other Ambulatory Visit (HOSPITAL_COMMUNITY): Payer: Self-pay | Admitting: *Deleted

## 2016-03-20 ENCOUNTER — Encounter (HOSPITAL_COMMUNITY): Payer: Self-pay | Admitting: *Deleted

## 2016-03-20 NOTE — Progress Notes (Signed)
Pt SDW-pre-op call completed by both pt and pt spouse with spousal consent. Pt denies any new/acute cardiopulmonary issues. Pt is under the care of Dr. Dane[facility]. Pt stated that cardiologist is aware that he is having this procedure. Pt stated that last dose of Aspirin was Tuesday 03/19/16 and last dose of Plavix was Sat. 03/16/16 as instructed. Spouse stated that they were given instructions regarding medications to hold ( diabetes pills ) and take ( BP and heart medications ) . Spouse made aware of diabetes protocol to hold Glipizide tonight and all diabetes pills the morning of surgery, check blood glucose every 2 hours the morning of procedure and interventions for blood glucose <70. Spouse and pt verbalized understanding of all pre-op instructions.

## 2016-03-21 ENCOUNTER — Ambulatory Visit (HOSPITAL_COMMUNITY)
Admission: RE | Admit: 2016-03-21 | Discharge: 2016-03-21 | Disposition: A | Discharge status: Home / Self Care | Payer: Commercial Managed Care - HMO | Source: Ambulatory Visit | Attending: Gastroenterology | Admitting: Gastroenterology

## 2016-03-21 ENCOUNTER — Encounter (HOSPITAL_COMMUNITY): Payer: Self-pay | Admitting: *Deleted

## 2016-03-21 ENCOUNTER — Encounter (HOSPITAL_COMMUNITY)
Admission: RE | Disposition: A | Discharge status: Home / Self Care | Payer: Self-pay | Source: Ambulatory Visit | Attending: Gastroenterology

## 2016-03-21 ENCOUNTER — Ambulatory Visit (HOSPITAL_COMMUNITY): Payer: Commercial Managed Care - HMO | Admitting: Anesthesiology

## 2016-03-21 DIAGNOSIS — K2970 Gastritis, unspecified, without bleeding: Secondary | ICD-10-CM | POA: Insufficient documentation

## 2016-03-21 DIAGNOSIS — D123 Benign neoplasm of transverse colon: Secondary | ICD-10-CM | POA: Diagnosis not present

## 2016-03-21 DIAGNOSIS — K5730 Diverticulosis of large intestine without perforation or abscess without bleeding: Secondary | ICD-10-CM | POA: Insufficient documentation

## 2016-03-21 DIAGNOSIS — Z86010 Personal history of colonic polyps: Secondary | ICD-10-CM | POA: Diagnosis not present

## 2016-03-21 DIAGNOSIS — Z1211 Encounter for screening for malignant neoplasm of colon: Secondary | ICD-10-CM | POA: Diagnosis not present

## 2016-03-21 DIAGNOSIS — I2510 Atherosclerotic heart disease of native coronary artery without angina pectoris: Secondary | ICD-10-CM | POA: Diagnosis not present

## 2016-03-21 DIAGNOSIS — K644 Residual hemorrhoidal skin tags: Secondary | ICD-10-CM | POA: Diagnosis not present

## 2016-03-21 DIAGNOSIS — E119 Type 2 diabetes mellitus without complications: Secondary | ICD-10-CM | POA: Diagnosis not present

## 2016-03-21 DIAGNOSIS — I10 Essential (primary) hypertension: Secondary | ICD-10-CM | POA: Diagnosis not present

## 2016-03-21 DIAGNOSIS — K219 Gastro-esophageal reflux disease without esophagitis: Secondary | ICD-10-CM | POA: Insufficient documentation

## 2016-03-21 DIAGNOSIS — J449 Chronic obstructive pulmonary disease, unspecified: Secondary | ICD-10-CM | POA: Diagnosis not present

## 2016-03-21 DIAGNOSIS — R933 Abnormal findings on diagnostic imaging of other parts of digestive tract: Secondary | ICD-10-CM | POA: Diagnosis not present

## 2016-03-21 DIAGNOSIS — K7460 Unspecified cirrhosis of liver: Secondary | ICD-10-CM | POA: Insufficient documentation

## 2016-03-21 DIAGNOSIS — Q2733 Arteriovenous malformation of digestive system vessel: Secondary | ICD-10-CM | POA: Diagnosis not present

## 2016-03-21 DIAGNOSIS — Z87891 Personal history of nicotine dependence: Secondary | ICD-10-CM | POA: Diagnosis not present

## 2016-03-21 DIAGNOSIS — Z13810 Encounter for screening for upper gastrointestinal disorder: Secondary | ICD-10-CM | POA: Diagnosis not present

## 2016-03-21 DIAGNOSIS — Z79899 Other long term (current) drug therapy: Secondary | ICD-10-CM | POA: Diagnosis not present

## 2016-03-21 DIAGNOSIS — D124 Benign neoplasm of descending colon: Secondary | ICD-10-CM | POA: Insufficient documentation

## 2016-03-21 DIAGNOSIS — K641 Second degree hemorrhoids: Secondary | ICD-10-CM | POA: Diagnosis not present

## 2016-03-21 HISTORY — DX: Personal history of colonic polyps: Z86.010

## 2016-03-21 HISTORY — DX: Unspecified cirrhosis of liver: K74.60

## 2016-03-21 HISTORY — PX: COLONOSCOPY WITH PROPOFOL: SHX5780

## 2016-03-21 HISTORY — PX: ESOPHAGOGASTRODUODENOSCOPY (EGD) WITH PROPOFOL: SHX5813

## 2016-03-21 HISTORY — DX: Personal history of colon polyps, unspecified: Z86.0100

## 2016-03-21 SURGERY — ESOPHAGOGASTRODUODENOSCOPY (EGD) WITH PROPOFOL
Anesthesia: Monitor Anesthesia Care

## 2016-03-21 MED ORDER — LACTATED RINGERS IV SOLN
INTRAVENOUS | Status: DC | PRN
Start: 2016-03-21 — End: 2016-03-21
  Administered 2016-03-21: 10:00:00 via INTRAVENOUS

## 2016-03-21 MED ORDER — SODIUM CHLORIDE 0.9 % IV SOLN: INTRAVENOUS | Status: DC

## 2016-03-21 MED ORDER — ONDANSETRON HCL 4 MG/2ML IJ SOLN: 4.0000 mg | Freq: Once | INTRAMUSCULAR | Status: DC | PRN

## 2016-03-21 MED ORDER — LIDOCAINE HCL (CARDIAC) 20 MG/ML IV SOLN
INTRAVENOUS | Status: DC | PRN
  Administered 2016-03-21: 50 mg via INTRATRACHEAL

## 2016-03-21 MED ORDER — PROPOFOL 500 MG/50ML IV EMUL
INTRAVENOUS | Status: DC | PRN
  Administered 2016-03-21: 125 ug/kg/min via INTRAVENOUS

## 2016-03-21 MED ORDER — CLOPIDOGREL BISULFATE 75 MG PO TABS: 75.0000 mg | ORAL_TABLET | Freq: Every day | ORAL | Status: DC

## 2016-03-21 MED ORDER — EPHEDRINE SULFATE 50 MG/ML IJ SOLN
INTRAMUSCULAR | Status: DC | PRN
  Administered 2016-03-21 (×2): 10 mg via INTRAVENOUS

## 2016-03-21 MED ORDER — SODIUM CHLORIDE 0.9 % IV SOLN: Freq: Once | INTRAVENOUS | Status: DC

## 2016-03-21 MED ORDER — FENTANYL CITRATE (PF) 100 MCG/2ML IJ SOLN: 25.0000 ug | INTRAMUSCULAR | Status: DC | PRN

## 2016-03-21 MED ORDER — BUTAMBEN-TETRACAINE-BENZOCAINE 2-2-14 % EX AERO
INHALATION_SPRAY | CUTANEOUS | Status: DC | PRN
  Administered 2016-03-21: 2 via TOPICAL

## 2016-03-21 MED ORDER — ASPIRIN EC 81 MG PO TBEC: 81.0000 mg | DELAYED_RELEASE_TABLET | Freq: Every day | ORAL | Status: AC

## 2016-03-21 NOTE — Op Note (Signed)
[facility] Patient Name: Christian Mccall Procedure Date : 03/21/2016 MRN: [id number] Attending MD: Arta Silence , MD Date of Birth: 1955-07-08 CSN: [id number] Age: 60 Admit Type: Outpatient Procedure:                Upper GI endoscopy Indications:              Screening procedure, Abnormal CT of the GI tract,                            cirrhosis Providers:                Arta Silence, MD, Zenon Mayo, RN, Cherylynn Ridges, Technician, Gershon Crane, CRNA Referring MD:              Medicines:                Propofol per Anesthesia Complications:            No immediate complications. Estimated Blood Loss:     Estimated blood loss: none. Procedure:                Pre-Anesthesia Assessment:                           - Prior to the procedure, a History and Physical                            was performed, and patient medications and                            allergies were reviewed. The patient's tolerance of                            previous anesthesia was also reviewed. The risks                            and benefits of the procedure and the sedation                            options and risks were discussed with the patient.                            All questions were answered, and informed consent                            was obtained. Prior Anticoagulants: The patient has                            taken Plavix (clopidogrel), last dose was 5 days                            prior to procedure. ASA Grade Assessment: III - A  patient with severe systemic disease. After                            reviewing the risks and benefits, the patient was                            deemed in satisfactory condition to undergo the                            procedure.                           After obtaining informed consent, the endoscope was                            passed under direct vision. Throughout the                       procedure, the patient's blood pressure, pulse, and                            oxygen saturations were monitored continuously. The                            [id number]) scope was introduced through the                            mouth, and advanced to the second part of duodenum.                            The upper GI endoscopy was accomplished without                            difficulty. The patient tolerated the procedure                            well. Scope In: Scope Out: Findings:      The examined esophagus was normal.      Patchy mild inflammation characterized by erosions and granularity was       found in the prepyloric region of the stomach.      The exam of the stomach was otherwise normal.      The duodenal bulb, first portion of the duodenum and second portion of       the duodenum were normal. Impression:               - Normal esophagus.                           - Gastritis.                           - Normal duodenal bulb, first portion of the                            duodenum and second portion of the duodenum.                           -  No gastric or esophageal varices noted. Moderate Sedation:      None Recommendation:           - NPO until further notice.                           - Perform a colonoscopy today.                           - Continue present medications. Procedure Code(s):        --- Professional ---                           [phone], Esophagogastroduodenoscopy, flexible,                            transoral; diagnostic, including collection of                            specimen(s) by brushing or washing, when performed                            (separate procedure) Diagnosis Code(s):        --- Professional ---                           K29.70, Gastritis, unspecified, without bleeding                           Z13.810, Encounter for screening for upper                            gastrointestinal disorder                            R93.3, Abnormal findings on diagnostic imaging of                            other parts of digestive tract CPT copyright 2016 American Medical Association. All rights reserved. The codes documented in this report are preliminary and upon coder review may  be revised to meet current compliance requirements. Arta Silence, MD 03/21/2016 12:32:28 PM This report has been signed electronically. Number of Addenda: 0

## 2016-03-21 NOTE — H&P (Signed)
Patient interval history reviewed.  Patient examined again.  There has been no change from documented H/P dated 03/04/16 (scanned into chart from our office) except as documented above.  Assessment:  1.  Cirrhosis. 2.  Personal history of colonic polyps.  Plan:  1.  Endoscopy. 2.  Risks (bleeding, infection, bowel perforation that could require surgery, sedation-related changes in cardiopulmonary systems), benefits (identification and possible treatment of source of symptoms, exclusion of certain causes of symptoms), and alternatives (watchful waiting, radiographic imaging studies, empiric medical treatment) of upper endoscopy (EGD) were explained to patient/family in detail and patient wishes to proceed. 3.  Colonoscopy  4.  Risks (bleeding, infection, bowel perforation that could require surgery, sedation-related changes in cardiopulmonary systems), benefits (identification and possible treatment of source of symptoms, exclusion of certain causes of symptoms), and alternatives (watchful waiting, radiographic imaging studies, empiric medical treatment) of colonoscopy were explained to patient/family in detail and patient wishes to proceed.

## 2016-03-21 NOTE — Op Note (Signed)
[facility] Patient Name: Christian Mccall Procedure Date : 03/21/2016 MRN: [id number] Attending MD: Arta Silence , MD Date of Birth: September 17, 1955 CSN: [id number] Age: 60 Admit Type: Outpatient Procedure:                Colonoscopy Indications:              High risk colon cancer surveillance: Personal                            history of colonic polyps, Last colonoscopy: 2013 Providers:                Arta Silence, MD, Zenon Mayo, RN, Cherylynn Ridges, Technician, Gershon Crane, CRNA Referring MD:              Medicines:                Propofol per Anesthesia, platelets infused                            pre-procedure Complications:            No immediate complications. Estimated Blood Loss:     Estimated blood loss was minimal. Procedure:                Pre-Anesthesia Assessment:                           - Prior to the procedure, a History and Physical                            was performed, and patient medications and                            allergies were reviewed. The patient's tolerance of                            previous anesthesia was also reviewed. The risks                            and benefits of the procedure and the sedation                            options and risks were discussed with the patient.                            All questions were answered, and informed consent                            was obtained. Prior Anticoagulants: The patient has                            taken Plavix (clopidogrel), last dose was 5 days  prior to procedure. ASA Grade Assessment: III - A                            patient with severe systemic disease. After                            reviewing the risks and benefits, the patient was                            deemed in satisfactory condition to undergo the                            procedure.                           After obtaining informed consent,  the colonoscope                            was passed under direct vision. Throughout the                            procedure, the patient's blood pressure, pulse, and                            oxygen saturations were monitored continuously. The                            [id number]) scope was introduced through                            the anus and advanced to the the cecum, identified                            by appendiceal orifice and ileocecal valve. The                            ileocecal valve, appendiceal orifice, and rectum                            were photographed. The entire colon was examined.                            The colonoscopy was performed without difficulty.                            The patient tolerated the procedure well. The                            quality of the bowel preparation was fair. Scope In: 11:42:37 AM Scope Out: 12:07:47 PM Scope Withdrawal Time: 0 hours 19 minutes 59 seconds  Total Procedure Duration: 0 hours 25 minutes 10 seconds  Findings:      The perianal exam findings include non-thrombosed external hemorrhoids.      A few medium-mouthed diverticula were found in the sigmoid  colon.      Bowel prep diffusely fair; semisolid and viscous stool obscured some       views throughout colon; diminutive or subtle sessile polyps could easily       have been missed.      Five sessile polyps were found in the descending colon and transverse       colon. The polyps were 6 to 8 mm in size. These polyps were removed with       a hot snare. Resection and retrieval were complete.      Colon otherwise normal; no other polyps, masses, vascular ectasias, or       inflammatory changes were seen.      The retroflexed view of the distal rectum and anal verge was normal and       showed no anal or rectal abnormalities. Impression:               - Preparation of the colon was fair.                           - Non-thrombosed external hemorrhoids  found on                            perianal exam.                           - Diverticulosis in the sigmoid colon.                           - Five 6 to 8 mm polyps in the descending colon and                            in the transverse colon, removed with a hot snare.                            Resected and retrieved.                           - The distal rectum and anal verge are normal on                            retroflexion view. Moderate Sedation:      None Recommendation:           - Patient has a contact number available for                            emergencies. The signs and symptoms of potential                            delayed complications were discussed with the                            patient. Return to normal activities tomorrow.                            Written discharge instructions were provided to the  patient.                           - Discharge patient to home (via wheelchair).                           - Resume previous diet today.                           - Resume aspirin tomorrow and Plavix (clopidogrel)                            in 3 days at prior doses.                           - Await pathology results.                           - Repeat colonoscopy in 3 - 5 years for                            surveillance based on pathology results.                           - Return to GI clinic in 4 months.                           - Return to referring physician as previously                            scheduled. Procedure Code(s):        --- Professional ---                           [phone], Colonoscopy, flexible; with removal of                            tumor(s), polyp(s), or other lesion(s) by snare                            technique Diagnosis Code(s):        --- Professional ---                           Z86.010, Personal history of colonic polyps                           K64.4, Residual hemorrhoidal skin tags                            D12.4, Benign neoplasm of descending colon                           D12.3, Benign neoplasm of transverse colon (hepatic                            flexure or splenic  flexure)                           K57.30, Diverticulosis of large intestine without                            perforation or abscess without bleeding CPT copyright 2016 American Medical Association. All rights reserved. The codes documented in this report are preliminary and upon coder review may  be revised to meet current compliance requirements. Arta Silence, MD 03/21/2016 12:36:36 PM This report has been signed electronically. Number of Addenda: 0

## 2016-03-21 NOTE — Discharge Instructions (Signed)
Endoscopy Care After Please read the instructions outlined below and refer to this sheet in the next few weeks. These discharge instructions provide you with general information on caring for yourself after you leave the hospital. Your doctor may also give you specific instructions. While your treatment has been planned according to the most current medical practices available, unavoidable complications occasionally occur. If you have any problems or questions after discharge, please call Dr. Aylissa Heinemann ([facility]) at [phone].  HOME CARE INSTRUCTIONS Activity You may resume your regular activity but move at a slower pace for the next 24 hours.  Take frequent rest periods for the next 24 hours.  Walking will help expel (get rid of) the air and reduce the bloated feeling in your abdomen.  No driving for 24 hours (because of the anesthesia (medicine) used during the test).  You may shower.  Do not sign any important legal documents or operate any machinery for 24 hours (because of the anesthesia used during the test).  Nutrition Drink plenty of fluids.  You may resume your normal diet.  Begin with a light meal and progress to your normal diet.  Avoid alcoholic beverages for 24 hours or as instructed by your caregiver.  Medications You may resume your normal medications unless your caregiver tells you otherwise. What you can expect today You may experience abdominal discomfort such as a feeling of fullness or "gas" pains.  You may experience a sore throat for 2 to 3 days. This is normal. Gargling with salt water may help this.   SEEK IMMEDIATE MEDICAL CARE IF: You have excessive nausea (feeling sick to your stomach) and/or vomiting.  You have severe abdominal pain and distention (swelling).  You have trouble swallowing.  You have a temperature over 100 F (37.8 C).  You have rectal bleeding or vomiting of blood.  Document Released: 01/09/2004 Document Revised: 02/06/2011  Document Reviewed: 07/22/2007 ExitCare Patient Information 2012 [employer].   Colonoscopy  Post procedure instructions:  Read the instructions outlined below and refer to this sheet in the next few weeks. These discharge instructions provide you with general information on caring for yourself after you leave the hospital. Your doctor may also give you specific instructions. While your treatment has been planned according to the most current medical practices available, unavoidable complications occasionally occur. If you have any problems or questions after discharge, call Dr. Sharika Mosquera at [facility] ([phone]).  HOME CARE INSTRUCTIONS  ACTIVITY: You may resume your regular activity, but move at a slower pace for the next 24 hours.  Take frequent rest periods for the next 24 hours.  Walking will help get rid of the air and reduce the bloated feeling in your belly (abdomen).  No driving for 24 hours (because of the medicine (anesthesia) used during the test).  You may shower.  Do not sign any important legal documents or operate any machinery for 24 hours (because of the anesthesia used during the test).  NUTRITION: Drink plenty of fluids.  You may resume your normal diet as instructed by your doctor.  Begin with a light meal and progress to your normal diet. Heavy or fried foods are harder to digest and may make you feel sick to your stomach (nauseated).  Avoid alcoholic beverages for 24 hours or as instructed.  MEDICATIONS: You may resume your normal medications unless your doctor tells you otherwise.  WHAT TO EXPECT TODAY: Some feelings of bloating in the abdomen.  Passage of more gas than usual.  Spotting   of blood in your stool or on the toilet paper.  IF YOU HAD POLYPS REMOVED DURING THE COLONOSCOPY: No aspirin products for 7 days or as instructed.  No alcohol for 7 days or as instructed.  Eat a soft diet for the next 24 hours.   FINDING OUT THE RESULTS OF YOUR  TEST  Not all test results are available during your visit. If your test results are not back during the visit, make an appointment with your caregiver to find out the results. Do not assume everything is normal if you have not heard from your caregiver or the medical facility. It is important for you to follow up on all of your test results.     SEEK IMMEDIATE MEDICAL CARE IF:  You have more than a spotting of blood in your stool.  Your belly is swollen (abdominal distention).  You are nauseated or vomiting.  You have a fever.  You have abdominal pain or discomfort that is severe or gets worse throughout the day.    Document Released: 01/09/2004 Document Revised: 02/06/2011 Document Reviewed: 01/07/2008 ExitCare Patient Information 2012 [employer].  

## 2016-03-21 NOTE — Anesthesia Preprocedure Evaluation (Addendum)
Anesthesia Evaluation  Patient identified by MRN, date of birth, ID band Patient awake    Reviewed: Allergy & Precautions, NPO status , Patient's Chart, lab work & pertinent test results, reviewed documented beta blocker date and time   History of Anesthesia Complications Negative for: history of anesthetic complications  Airway Mallampati: II  TM Distance: >3 FB Neck ROM: Full    Dental  (+) Dental Advisory Given, Edentulous Upper, Edentulous Lower   Pulmonary COPD,  COPD inhaler, former smoker,    Pulmonary exam normal breath sounds clear to auscultation       Cardiovascular hypertension, Pt. on medications and Pt. on home beta blockers + angina + CAD, + Past MI and + Peripheral Vascular Disease (s/p EVAR)  Normal cardiovascular exam Rhythm:Regular Rate:Normal     Neuro/Psych  Headaches, TIACVA, No Residual Symptoms negative psych ROS   GI/Hepatic GERD  Medicated and Controlled,(+) Cirrhosis       ,   Endo/Other  diabetes, Type 2, Oral Hypoglycemic Agents  Renal/GU negative Renal ROS     Musculoskeletal negative musculoskeletal ROS (+)   Abdominal   Peds  Hematology  (+) Blood dyscrasia (Thrombocytopenia; Plavix), ,   Anesthesia Other Findings Day of surgery medications reviewed with the patient.  Reproductive/Obstetrics                            Anesthesia Physical Anesthesia Plan  ASA: III  Anesthesia Plan: MAC   Post-op Pain Management:    Induction: Intravenous  Airway Management Planned: Nasal Cannula  Additional Equipment:   Intra-op Plan:   Post-operative Plan:   Informed Consent: I have reviewed the patients History and Physical, chart, labs and discussed the procedure including the risks, benefits and alternatives for the proposed anesthesia with the patient or authorized representative who has indicated his/her understanding and acceptance.   Dental advisory  given  Plan Discussed with: CRNA and Anesthesiologist  Anesthesia Plan Comments: (Discussed risks/benefits/alternatives to MAC sedation including need for ventilatory support, hypotension, need for conversion to general anesthesia.  All patient questions answered.  Patient/guardian wishes to proceed.)        Anesthesia Quick Evaluation

## 2016-03-21 NOTE — Anesthesia Postprocedure Evaluation (Signed)
Anesthesia Post Note  Patient: Christian Mccall  Procedure(s) Performed: Procedure(s) (LRB): ESOPHAGOGASTRODUODENOSCOPY (EGD) WITH PROPOFOL (N/A) COLONOSCOPY WITH PROPOFOL (N/A)  Patient location during evaluation: Endoscopy Anesthesia Type: MAC Level of consciousness: awake and alert Pain management: pain level controlled Vital Signs Assessment: post-procedure vital signs reviewed and stable Respiratory status: spontaneous breathing, nonlabored ventilation, respiratory function stable and patient connected to nasal cannula oxygen Cardiovascular status: stable and blood pressure returned to baseline Anesthetic complications: no    Last Vitals:  Vitals:   03/21/16 1250 03/21/16 1251  BP: 134/62 134/62  Pulse: (!) 50 (!) 50  Resp: 13 13    Last Pain: There were no vitals filed for this visit.               [name]

## 2016-03-21 NOTE — Transfer of Care (Signed)
Immediate Anesthesia Transfer of Care Note  Patient: Christian Mccall  Procedure(s) Performed: Procedure(s): ESOPHAGOGASTRODUODENOSCOPY (EGD) WITH PROPOFOL (N/A) COLONOSCOPY WITH PROPOFOL (N/A)  Patient Location: PACU  Anesthesia Type:MAC  Level of Consciousness: awake, alert , oriented and patient cooperative  Airway & Oxygen Therapy: Patient Spontanous Breathing and Patient connected to nasal cannula oxygen  Post-op Assessment: Report given to RN and Post -op Vital signs reviewed and stable  Post vital signs: Reviewed and stable  Last Vitals:  Vitals:   03/21/16 1221  BP: 107/62  Pulse: 60  Resp: 15    Last Pain: There were no vitals filed for this visit.       Complications: No apparent anesthesia complications

## 2016-03-22 ENCOUNTER — Encounter (HOSPITAL_COMMUNITY): Payer: Self-pay | Admitting: Gastroenterology

## 2016-03-22 LAB — PREPARE PLATELET PHERESIS
UNIT DIVISION: 0
Unit division: 0

## 2016-04-04 DIAGNOSIS — E784 Other hyperlipidemia: Secondary | ICD-10-CM | POA: Diagnosis not present

## 2016-04-04 DIAGNOSIS — E559 Vitamin D deficiency, unspecified: Secondary | ICD-10-CM | POA: Diagnosis not present

## 2016-04-04 DIAGNOSIS — Z125 Encounter for screening for malignant neoplasm of prostate: Secondary | ICD-10-CM | POA: Diagnosis not present

## 2016-04-04 DIAGNOSIS — E538 Deficiency of other specified B group vitamins: Secondary | ICD-10-CM | POA: Diagnosis not present

## 2016-04-04 DIAGNOSIS — I10 Essential (primary) hypertension: Secondary | ICD-10-CM | POA: Diagnosis not present

## 2016-04-04 DIAGNOSIS — R808 Other proteinuria: Secondary | ICD-10-CM | POA: Diagnosis not present

## 2016-04-04 DIAGNOSIS — E1151 Type 2 diabetes mellitus with diabetic peripheral angiopathy without gangrene: Secondary | ICD-10-CM | POA: Diagnosis not present

## 2016-04-11 DIAGNOSIS — I119 Hypertensive heart disease without heart failure: Secondary | ICD-10-CM | POA: Diagnosis not present

## 2016-04-11 DIAGNOSIS — I517 Cardiomegaly: Secondary | ICD-10-CM | POA: Diagnosis not present

## 2016-04-11 DIAGNOSIS — I7389 Other specified peripheral vascular diseases: Secondary | ICD-10-CM | POA: Diagnosis not present

## 2016-04-11 DIAGNOSIS — D692 Other nonthrombocytopenic purpura: Secondary | ICD-10-CM | POA: Diagnosis not present

## 2016-04-11 DIAGNOSIS — I252 Old myocardial infarction: Secondary | ICD-10-CM | POA: Diagnosis not present

## 2016-04-11 DIAGNOSIS — Z0000 Encounter for general adult medical examination without abnormal findings: Secondary | ICD-10-CM | POA: Diagnosis not present

## 2016-04-11 DIAGNOSIS — Z23 Encounter for immunization: Secondary | ICD-10-CM | POA: Diagnosis not present

## 2016-04-11 DIAGNOSIS — D518 Other vitamin B12 deficiency anemias: Secondary | ICD-10-CM | POA: Diagnosis not present

## 2016-04-11 DIAGNOSIS — I2510 Atherosclerotic heart disease of native coronary artery without angina pectoris: Secondary | ICD-10-CM | POA: Diagnosis not present

## 2016-04-11 DIAGNOSIS — E1151 Type 2 diabetes mellitus with diabetic peripheral angiopathy without gangrene: Secondary | ICD-10-CM | POA: Diagnosis not present

## 2016-04-11 DIAGNOSIS — I208 Other forms of angina pectoris: Secondary | ICD-10-CM | POA: Diagnosis not present

## 2016-04-15 DIAGNOSIS — Z1212 Encounter for screening for malignant neoplasm of rectum: Secondary | ICD-10-CM | POA: Diagnosis not present

## 2016-04-17 DIAGNOSIS — Z86010 Personal history of colonic polyps: Secondary | ICD-10-CM | POA: Diagnosis not present

## 2016-04-17 DIAGNOSIS — K7460 Unspecified cirrhosis of liver: Secondary | ICD-10-CM | POA: Diagnosis not present

## 2016-04-24 ENCOUNTER — Telehealth: Payer: Self-pay | Admitting: Oncology

## 2016-04-24 ENCOUNTER — Ambulatory Visit (HOSPITAL_BASED_OUTPATIENT_CLINIC_OR_DEPARTMENT_OTHER): Payer: Commercial Managed Care - HMO | Admitting: Oncology

## 2016-04-24 ENCOUNTER — Other Ambulatory Visit (HOSPITAL_BASED_OUTPATIENT_CLINIC_OR_DEPARTMENT_OTHER): Payer: Commercial Managed Care - HMO

## 2016-04-24 VITALS — BP 138/57 | HR 50 | Temp 98.1°F | Resp 16 | Wt 218.3 lb

## 2016-04-24 DIAGNOSIS — K7460 Unspecified cirrhosis of liver: Secondary | ICD-10-CM

## 2016-04-24 DIAGNOSIS — D696 Thrombocytopenia, unspecified: Secondary | ICD-10-CM | POA: Diagnosis not present

## 2016-04-24 LAB — CBC WITH DIFFERENTIAL/PLATELET
BASO%: 0.5 % (ref 0.0–2.0)
Basophils Absolute: 0 10*3/uL (ref 0.0–0.1)
EOS ABS: 0.1 10*3/uL (ref 0.0–0.5)
EOS%: 2.6 % (ref 0.0–7.0)
HCT: 42 % (ref 38.4–49.9)
HGB: 14.1 g/dL (ref 13.0–17.1)
LYMPH%: 36.4 % (ref 14.0–49.0)
MCH: 30.1 pg (ref 27.2–33.4)
MCHC: 33.6 g/dL (ref 32.0–36.0)
MCV: 89.6 fL (ref 79.3–98.0)
MONO#: 0.6 10*3/uL (ref 0.1–0.9)
MONO%: 10.6 % (ref 0.0–14.0)
NEUT#: 2.7 10*3/uL (ref 1.5–6.5)
NEUT%: 49.9 % (ref 39.0–75.0)
PLATELETS: 73 10*3/uL — AB (ref 140–400)
RBC: 4.69 10*6/uL (ref 4.20–5.82)
RDW: 13.6 % (ref 11.0–14.6)
WBC: 5.5 10*3/uL (ref 4.0–10.3)
lymph#: 2 10*3/uL (ref 0.9–3.3)

## 2016-04-24 LAB — COMPREHENSIVE METABOLIC PANEL
ALBUMIN: 3.6 g/dL (ref 3.5–5.0)
ALK PHOS: 69 U/L (ref 40–150)
ALT: 31 U/L (ref 0–55)
AST: 28 U/L (ref 5–34)
Anion Gap: 10 mEq/L (ref 3–11)
BUN: 11.6 mg/dL (ref 7.0–26.0)
CO2: 23 mEq/L (ref 22–29)
CREATININE: 1 mg/dL (ref 0.7–1.3)
Calcium: 9.3 mg/dL (ref 8.4–10.4)
Chloride: 104 mEq/L (ref 98–109)
EGFR: 86 mL/min/{1.73_m2} — ABNORMAL LOW (ref >90)
GLUCOSE: 217 mg/dL — AB (ref 70–140)
POTASSIUM: 4.5 meq/L (ref 3.5–5.1)
SODIUM: 137 meq/L (ref 136–145)
Total Bilirubin: 0.69 mg/dL (ref 0.20–1.20)
Total Protein: 7.1 g/dL (ref 6.4–8.3)

## 2016-04-24 NOTE — Progress Notes (Signed)
Hematology and Oncology Follow Up Visit  Christian Mccall [id number] [birth year] 60 y.o. 04/24/2016 10:05 AM   Principle Diagnosis: 60 year old gentleman with chronic fluctuating thrombocytopenia. The etiology related to chronic liver disease vs. ITP.   Current therapy: Observation and surveillance.  Interim History:  Christian Mccall presents today for a followup visit. Since the last visit, he had an endoscopy done in October 2017. He received platelet transfusion and preparation and tolerated it well. He denied any postoperative bleeding or complications. He continues to have chronic shoulder pain which is not dramatically changed from previous examination. He denied any epistaxis, hematochezia or melena. He denies any easy bruisability. He remains active and currently involved in a rroject replacing the kitchen floor. He denied any excessive bleeding with minor trauma while doing so.  He denied any headaches, blurry vision, syncope or seizures. He does not report any chest pain, palpitation or orthopnea. He does not report any cough, wheezing or hemoptysis. Does not report any nausea, vomiting or abdominal pain. Does not report any constipation or diarrhea. He does not report any frequency urgency or hesitancy. Remaining review of systems unremarkable.  Medications: I have reviewed the patient's current medications.  Current Outpatient Prescriptions  Medication Sig Dispense Refill  . albuterol (PROVENTIL HFA;VENTOLIN HFA) 108 (90 BASE) MCG/ACT inhaler Inhale 2 puffs into the lungs daily as needed for wheezing or shortness of breath.    [name] aspirin EC 81 MG tablet Take 1 tablet (81 mg total) by mouth daily.    . cholecalciferol (VITAMIN D) 1000 UNITS tablet Take 1,000 Units by mouth daily.    . clonazePAM (KLONOPIN) 1 MG tablet Take 1 tablet (1 mg total) by mouth 2 (two) times daily. (Patient taking differently: Take 1 mg by mouth at bedtime. ) 60 tablet 5  . clopidogrel (PLAVIX) 75 MG tablet Take 1  tablet (75 mg total) by mouth daily.    . cyanocobalamin (,VITAMIN B-12,) 1000 MCG/ML injection Inject 1,000 mcg into the muscle See admin instructions. Every 30 to 60 days    . donepezil (ARICEPT ODT) 5 MG disintegrating tablet Take 5 mg by mouth at bedtime.    . empagliflozin (JARDIANCE) 10 MG TABS tablet Take 10 mg by mouth daily.    [name] escitalopram (LEXAPRO) 20 MG tablet Take 20 mg by mouth at bedtime.    [name] ezetimibe-simvastatin (VYTORIN) 10-40 MG per tablet Take 1 tablet by mouth at bedtime.     . fenofibrate 160 MG tablet Take 160 mg by mouth daily.     . Fluticasone-Salmeterol (ADVAIR) 100-50 MCG/DOSE AEPB Inhale 1 puff into the lungs 2 (two) times daily.     . furosemide (LASIX) 40 MG tablet Take 40 mg by mouth every other day.     [name] glimepiride (AMARYL) 4 MG tablet Take 4 mg by mouth at bedtime.     . lansoprazole (PREVACID) 30 MG capsule Take 30 mg by mouth daily.   3  . losartan (COZAAR) 50 MG tablet Take 50 mg by mouth daily.      . metFORMIN (GLUCOPHAGE) 1000 MG tablet Take 1,000 mg by mouth 2 (two) times daily. Breakfast and at bedtime    . metoprolol (LOPRESSOR) 50 MG tablet Take 50 mg by mouth 2 (two) times daily.     [name] oxyCODONE-acetaminophen (PERCOCET/ROXICET) 5-325 MG tablet 1 or 2 tabs PO q6h prn pain 20 tablet 0  . pramipexole (MIRAPEX) 0.25 MG tablet Take 0.25 mg by mouth at bedtime.     . pregabalin (  LYRICA) 100 MG capsule Take 100-200 mg by mouth See admin instructions. Take 1 tablet (100 mg) by mouth every morning and 2 tablets (200 mg) at bedtime    . ranolazine (RANEXA) 1000 MG SR tablet Take 1,000 mg by mouth 2 (two) times daily.      [name] terazosin (HYTRIN) 1 MG capsule Take 1 mg by mouth at bedtime.     [name] tiotropium (SPIRIVA) 18 MCG inhalation capsule Place 18 mcg into inhaler and inhale daily as needed (for wheezing or shortness of breath).     . nitroGLYCERIN (NITROSTAT) 0.4 MG SL tablet Place 0.4 mg under the tongue every 5 (five) minutes as needed for chest pain.      No current facility-administered medications for this visit.      Allergies: No Active Allergies  Past Medical History, Surgical history, Social history, and Family History were reviewed and updated.  Physical Exam: Blood pressure (!) 138/57, pulse (!) 50, temperature 98.1 F (36.7 C), temperature source Oral, resp. rate 16, weight 218 lb 4.8 oz (99 kg), SpO2 98 %. ECOG: 1 General appearance: alert and cooperative appeared without distress. Head: Normocephalic, without obvious abnormality.No oral thrush noted.  Neck: no adenopathy Lymph nodes: Cervical, supraclavicular, and axillary nodes normal. Heart:regular rate and rhythm, S1, S2 normal, no murmur, click, rub or gallop Lung: Clear to auscultation without wheezes or dullness to percussion.  Abdomin: soft, non-tender, without masses or organomegaly no rebound or guarding. EXT:no erythema, induration, or nodules   Lab Results: Lab Results  Component Value Date   WBC 5.5 04/24/2016   HGB 14.1 04/24/2016   HCT 42.0 04/24/2016   MCV 89.6 04/24/2016   PLT 73 (L) 04/24/2016     Chemistry      Component Value Date/Time   NA 136 02/16/2016 1708   NA 137 05/05/2013 1347   K 4.1 02/16/2016 1708   K 4.6 05/05/2013 1347   CL 102 02/16/2016 1708   CO2 23 02/16/2016 1708   CO2 22 05/05/2013 1347   BUN 13 02/16/2016 1708   BUN 12.9 05/05/2013 1347   CREATININE 0.95 02/16/2016 1708   CREATININE 0.92 08/07/2015 1131   CREATININE 1.2 05/05/2013 1347      Component Value Date/Time   CALCIUM 10.1 02/16/2016 1708   CALCIUM 10.2 05/05/2013 1347   ALKPHOS 71 10/01/2015 2202   ALKPHOS 69 05/05/2013 1347   AST 41 10/01/2015 2202   AST 33 05/05/2013 1347   ALT 50 10/01/2015 2202   ALT 32 05/05/2013 1347   BILITOT 2.2 (H) 10/01/2015 2202   BILITOT 0.68 05/05/2013 1347        Impression and Plan:  60 year old gentleman with the following issues:  1. Thrombocytopenia: the etiology is related to to liver disease and  splenomegaly versus autoimmune thrombocytopenia. His platelet count today is 73 without any spontaneous bleeding.  I have recommended continued surveillance for now without intervention. His risk of bleeding is low unless he has to have surgery or platalet drops below 30,000.  He underwent endoscopy last month and received transfusion without any bleeding postoperatively. He is to have any surgical procedure, I recommend preoperative platelet transfusion prophylactically and he is able to withstand any surgery without complications.  2. PCirrhosis of the liver: He continues to be managed by gastroenterology.  3. Follow-up: Will be in 9 months.   [id number], MD 11/15/201710:05 AM

## 2016-04-24 NOTE — Telephone Encounter (Signed)
Appointments scheduled per 04/24/16 los. AVS report and appointment schedule given to patient per 04/24/16 los. °

## 2016-05-14 NOTE — Addendum Note (Signed)
Addended by: Lianne Cure A on: 05/14/2016 09:13 AM   Modules accepted: Orders

## 2016-05-17 DIAGNOSIS — K7460 Unspecified cirrhosis of liver: Secondary | ICD-10-CM | POA: Diagnosis not present

## 2016-07-09 DIAGNOSIS — E668 Other obesity: Secondary | ICD-10-CM | POA: Diagnosis not present

## 2016-07-09 DIAGNOSIS — I10 Essential (primary) hypertension: Secondary | ICD-10-CM | POA: Diagnosis not present

## 2016-07-09 DIAGNOSIS — R413 Other amnesia: Secondary | ICD-10-CM | POA: Diagnosis not present

## 2016-07-09 DIAGNOSIS — D519 Vitamin B12 deficiency anemia, unspecified: Secondary | ICD-10-CM | POA: Diagnosis not present

## 2016-07-09 DIAGNOSIS — I2510 Atherosclerotic heart disease of native coronary artery without angina pectoris: Secondary | ICD-10-CM | POA: Diagnosis not present

## 2016-07-09 DIAGNOSIS — E1151 Type 2 diabetes mellitus with diabetic peripheral angiopathy without gangrene: Secondary | ICD-10-CM | POA: Diagnosis not present

## 2016-07-09 DIAGNOSIS — I7389 Other specified peripheral vascular diseases: Secondary | ICD-10-CM | POA: Diagnosis not present

## 2016-07-09 DIAGNOSIS — D692 Other nonthrombocytopenic purpura: Secondary | ICD-10-CM | POA: Diagnosis not present

## 2016-07-09 DIAGNOSIS — I208 Other forms of angina pectoris: Secondary | ICD-10-CM | POA: Diagnosis not present

## 2016-08-13 ENCOUNTER — Encounter: Payer: Self-pay | Admitting: Interventional Cardiology

## 2016-08-21 ENCOUNTER — Encounter: Payer: Self-pay | Admitting: Interventional Cardiology

## 2016-08-21 ENCOUNTER — Ambulatory Visit (INDEPENDENT_AMBULATORY_CARE_PROVIDER_SITE_OTHER): Payer: Medicare HMO | Admitting: Interventional Cardiology

## 2016-08-21 VITALS — BP 128/76 | HR 59 | Ht 71.0 in | Wt 218.0 lb

## 2016-08-21 DIAGNOSIS — I10 Essential (primary) hypertension: Secondary | ICD-10-CM | POA: Diagnosis not present

## 2016-08-21 DIAGNOSIS — T82330D Leakage of aortic (bifurcation) graft (replacement), subsequent encounter: Secondary | ICD-10-CM

## 2016-08-21 DIAGNOSIS — E784 Other hyperlipidemia: Secondary | ICD-10-CM | POA: Diagnosis not present

## 2016-08-21 DIAGNOSIS — G8190 Hemiplegia, unspecified affecting unspecified side: Secondary | ICD-10-CM | POA: Diagnosis not present

## 2016-08-21 DIAGNOSIS — I25708 Atherosclerosis of coronary artery bypass graft(s), unspecified, with other forms of angina pectoris: Secondary | ICD-10-CM

## 2016-08-21 DIAGNOSIS — IMO0002 Reserved for concepts with insufficient information to code with codable children: Secondary | ICD-10-CM

## 2016-08-21 DIAGNOSIS — T82330A Leakage of aortic (bifurcation) graft (replacement), initial encounter: Secondary | ICD-10-CM

## 2016-08-21 DIAGNOSIS — E7849 Other hyperlipidemia: Secondary | ICD-10-CM

## 2016-08-21 NOTE — Patient Instructions (Signed)
Medication Instructions:  None  Labwork: None  Testing/Procedures: None  Follow-Up: Your physician wants you to follow-up in: 1 year with Dr. Tamala Julian. You will receive a reminder letter in the mail two months in advance. If you don't receive a letter, please call our office to schedule the follow-up appointment.   Any Other Special Instructions Will Be Listed Below (If Applicable).  If you see your blood pressure consistently running 140/90 or higher, please contact Dr. Morton Amy office.   If you need a refill on your cardiac medications before your next appointment, please call your pharmacy.

## 2016-08-21 NOTE — Progress Notes (Signed)
Cardiology Office Note    Date:  08/21/2016   ID:  Christian Mccall, DOB February 09, 1956, MRN [id number]  PCP:  Precious Reel, MD  Cardiologist: Sinclair Grooms, MD   Chief Complaint  Patient presents with  . Coronary Artery Disease    History of Present Illness:  Christian Mccall is a 61 y.o. male who presents for Coronary artery disease, coronary bypass grafting 2002 , bypass graft Dr. Richardson Landry in 2010 by angiography, abdominal aortic aneurysm with endovascular repair and known endoleak, COPD, type 2 diabetes, prior myocardial infarction, history of CVA, obstructive sleep apnea, essential hypertension and hyperlipidemia  He is doing okay from cardiac standpoint. He has had chronic recurrent episodes of chest discomfort with physical activity. Mild tightness in chest. No recent cardiac ischemic evaluation. No real change in symptoms now for many many years. Also occasionally has sharp shooting pains. He denies orthopnea, edema, and has excessive daytime sleepiness. Sleep apnea as never been diagnosed. He denies claudication.   Past Medical History:  Diagnosis Date  . AAA (abdominal aortic aneurysm) ([place])   . Anginal pain ([place])   . Bulging lumbar disc    "& some in my neck"  . CAD (coronary artery disease)   . Change in platelet count    pt states 'has been low since 2012; patient had to receive platelets in surgery in Nov 2016'  . Chronic back pain   . Cirrhosis ([place])   . COPD (chronic obstructive pulmonary disease) ([place])   . Diabetic peripheral neuropathy ([place])   . Emphysema lung ([place])   . Enlarged liver   . Enlarged prostate   . GERD (gastroesophageal reflux disease)   . Hardening of the arteries of the brain   . Headache    "~ weekly" (04/12/2015)  . Heart attack Nov. 15,2016   TIA  . History of colon polyps   . Hyperlipidemia   . Hypertension   . Leg pain    with walking and pain in feet while lying flat  . Memory loss    Due to hardening of the arteries in the brain  .  Myocardial infarction    "I've had probably 6-7" (04/12/2015)  . Restless leg syndrome   . Shortness of breath 12/06/10   while lying flat and with exertion  . Snoring   . Spleen enlarged   . Stroke [facility]) 2004   denies residual on 04/12/2015  . Thrombocytopenia ([place])    since 2012; can be as low as 54K.   [name] TIA (transient ischemic attack) "several"   "since 2015" (04/12/2015)  . Type II diabetes mellitus ([place]) dx'd 2009    Past Surgical History:  Procedure Laterality Date  . ABDOMINAL AORTIC ANEURYSM REPAIR  01-16-11   EVAR  . ABDOMINAL AORTIC ANEURYSM REPAIR  04/12/2015  . CARDIAC CATHETERIZATION  "several"  . COLONOSCOPY    . COLONOSCOPY W/ BIOPSIES    . COLONOSCOPY WITH PROPOFOL N/A 03/21/2016   Procedure: COLONOSCOPY WITH PROPOFOL;  Surgeon: Arta Silence, MD;  Location: [facility] ENDOSCOPY;  Service: Endoscopy;  Laterality: N/A;  . CORONARY ANGIOPLASTY WITH STENT PLACEMENT  "several"   > 4 sents  . CORONARY ARTERY BYPASS GRAFT  02/18/2001   "CABG x 4"  . Direct sac puncture with liquid embolic repair of Type II endoleak  04/12/2015  . ESOPHAGOGASTRODUODENOSCOPY (EGD) WITH PROPOFOL N/A 03/21/2016   Procedure: ESOPHAGOGASTRODUODENOSCOPY (EGD) WITH PROPOFOL;  Surgeon: Arta Silence, MD;  Location: [facility] ENDOSCOPY;  Service: Endoscopy;  Laterality:  N/A;  . PERIPHERAL VASCULAR CATHETERIZATION N/A 03/10/2015   Procedure: Abdominal Aortogram;  Surgeon: Elam Dutch, MD;  Location: [facility];  Service: Cardiovascular;  Laterality: N/A;  . RADIOLOGY WITH ANESTHESIA N/A 04/12/2015   Procedure: embolization;  Surgeon: Jacqulynn Cadet, MD;  Location: [facility];  Service: Radiology;  Laterality: N/A;  . SHOULDER SURGERY Right 07-27-2015  . TONSILLECTOMY  ~ 1965    Current Medications: Outpatient Medications Prior to Visit  Medication Sig Dispense Refill  . albuterol (PROVENTIL HFA;VENTOLIN HFA) 108 (90 BASE) MCG/ACT inhaler Inhale 2 puffs into the lungs daily as needed for wheezing or  shortness of breath.    [name] aspirin EC 81 MG tablet Take 1 tablet (81 mg total) by mouth daily.    . cholecalciferol (VITAMIN D) 1000 UNITS tablet Take 1,000 Units by mouth 2 (two) times daily.     . clopidogrel (PLAVIX) 75 MG tablet Take 1 tablet (75 mg total) by mouth daily.    . cyanocobalamin (,VITAMIN B-12,) 1000 MCG/ML injection Inject 1,000 mcg into the muscle See admin instructions. Every 30 to 60 days    . empagliflozin (JARDIANCE) 10 MG TABS tablet Take 10 mg by mouth daily.    [name] escitalopram (LEXAPRO) 20 MG tablet Take 20 mg by mouth at bedtime.    [name] ezetimibe-simvastatin (VYTORIN) 10-40 MG per tablet Take 1 tablet by mouth at bedtime.     . fenofibrate 160 MG tablet Take 160 mg by mouth daily.     . Fluticasone-Salmeterol (ADVAIR) 100-50 MCG/DOSE AEPB Inhale 1 puff into the lungs 2 (two) times daily.     . furosemide (LASIX) 40 MG tablet Take 40 mg by mouth every other day.     [name] glimepiride (AMARYL) 4 MG tablet Take 4 mg by mouth at bedtime.     . lansoprazole (PREVACID) 30 MG capsule Take 30 mg by mouth daily.   3  . losartan (COZAAR) 50 MG tablet Take 50 mg by mouth daily.      . metFORMIN (GLUCOPHAGE) 1000 MG tablet Take 1,000 mg by mouth 2 (two) times daily. Breakfast and at bedtime    . metoprolol (LOPRESSOR) 50 MG tablet Take 50 mg by mouth 2 (two) times daily.     . nitroGLYCERIN (NITROSTAT) 0.4 MG SL tablet Place 0.4 mg under the tongue every 5 (five) minutes as needed for chest pain.    [name] oxyCODONE-acetaminophen (PERCOCET/ROXICET) 5-325 MG tablet 1 or 2 tabs PO q6h prn pain 20 tablet 0  . pramipexole (MIRAPEX) 0.25 MG tablet Take 0.25 mg by mouth at bedtime.     . pregabalin (LYRICA) 100 MG capsule Take 100-200 mg by mouth See admin instructions. Take 1 tablet (100 mg) by mouth every morning and 2 tablets (200 mg) at bedtime    . ranolazine (RANEXA) 1000 MG SR tablet Take 1,000 mg by mouth 2 (two) times daily.      [name] terazosin (HYTRIN) 1 MG capsule Take 1 mg by mouth at  bedtime.     [name] tiotropium (SPIRIVA) 18 MCG inhalation capsule Place 18 mcg into inhaler and inhale daily as needed (for wheezing or shortness of breath).     . clonazePAM (KLONOPIN) 1 MG tablet Take 1 tablet (1 mg total) by mouth 2 (two) times daily. (Patient not taking: Reported on 08/21/2016) 60 tablet 5  . donepezil (ARICEPT ODT) 5 MG disintegrating tablet Take 5 mg by mouth at bedtime.     No facility-administered medications prior to visit.  Allergies:   Patient has no known allergies.   Social History   Social History  . Marital status: Married    Spouse name: reba  . Number of children: 2  . Years of education: 9   Occupational History  . disability    Social History Main Topics  . Smoking status: Former Smoker    Packs/day: 0.00    Years: 30.00    Types: Cigarettes    Quit date: 02/17/2001  . Smokeless tobacco: Never Used  . Alcohol use Yes     Comment: occasional  . Drug use: No  . Sexual activity: Not Currently   Other Topics Concern  . None   Social History Narrative   Patient lives at home with his wife.      Family History:  The patient's family history includes Aneurysm in his maternal aunt, maternal uncle, and mother; Cancer in his sister; Heart attack in his brother, brother, and brother; Heart disease in his brother, brother, mother, and sister; Hyperlipidemia in his father and mother; Hypertension in his brother, father, and mother; Lupus in his sister; Other in his maternal aunt, maternal uncle, and mother.   ROS:   Please see the history of present illness.    Decreased memory is a major concern. He wakes up frequently at night, rare episodes of shortness of breath while lying down, weight gain, leg pain, snoring, nausea, difficulty with balance, difficulty with memory, muscle pain, back pain, and dizziness. Recently diagnosed with cirrhosis and hypersplenism with low platelet counts.  All other systems reviewed and are negative.   PHYSICAL EXAM:    VS:  BP 128/76 (BP Location: Left Arm)   Pulse (!) 59   Ht 5\' 11"  (1.803 m)   Wt 218 lb (98.9 kg)   BMI 30.40 kg/m    GEN: Well nourished, well developed, in no acute distress  HEENT: normal  Neck: no JVD, carotid bruits, or masses Cardiac: RRR; no murmurs, rubs, or gallops,no edema  Respiratory:  clear to auscultation bilaterally, normal work of breathing GI: soft, nontender, nondistended, + BS MS: no deformity or atrophy  Skin: warm and dry, no rash Neuro:  Alert and Oriented x 3, Strength and sensation are intact Psych: euthymic mood, full affect  Wt Readings from Last 3 Encounters:  08/21/16 218 lb (98.9 kg)  04/24/16 218 lb 4.8 oz (99 kg)  03/21/16 220 lb (99.8 kg)      Studies/Labs Reviewed:   EKG:  EKG  Performed in September 2017 reveals sinus rhythm, short PR, and poor R-wave progression.  Recent Labs: 04/24/2016: ALT 31; BUN 11.6; Creatinine 1.0; HGB 14.1; Platelets 73; Potassium 4.5; Sodium 137   Lipid Panel    Component Value Date/Time   CHOL 129 05/02/2013 0410   TRIG 230 (H) 05/02/2013 0410   HDL 69 05/02/2013 0410   CHOLHDL 1.9 05/02/2013 0410   VLDL 46 (H) 05/02/2013 0410   LDLCALC 14 05/02/2013 0410    Additional studies/ records that were reviewed today include:  No new functional or imaging data    ASSESSMENT:    1. Coronary artery disease of bypass graft of native heart with stable angina pectoris ([place])   2. Essential hypertension, benign   3. Other hyperlipidemia   4. Hemiplegia affecting nondominant side ([place])   5. Endoleak of aortic graft ([place])      PLAN:  In order of problems listed above:  1. Stable from a standpoint of coronary disease. Stable angina without significant use  of nitroglycerin. Risk factors of being managed by Dr. Virgina Jock and our goals are blood pressures less than 100/90 mmHg, LDL cholesterol less than or equal to 70. 2. Blood pressures under excellent control. Target less than 140/90 mmHg an optimally 125/75 mmHg.  2 g sodium diet and weight loss I have a complicated. 3. He is on therapy for hyperlipidemia. This being nicely followed by Dr. Virgina Jock. Current regimen is Vytorin. Target is less than 70 LDL 4. Progressive difficulty with memory possibly related to microvascular disease. Mild weakness in the right arm but he is ambulatory. We discussed progressive memory and consideration of driving cessation. 5. Abdominal aortic aneurysm, successfully repaired with endovascular therapy.  Overall plan is that I encouraged aerobic activity. He should report changing cardiovascular symptoms including more easily inducible chest discomfort, dyspnea, or swelling. Otherwise will plan to see him back in one year.    Medication Adjustments/Labs and Tests Ordered: Current medicines are reviewed at length with the patient today.  Concerns regarding medicines are outlined above.  Medication changes, Labs and Tests ordered today are listed in the Patient Instructions below. Patient Instructions  Medication Instructions:  None  Labwork: None  Testing/Procedures: None  Follow-Up: Your physician wants you to follow-up in: 1 year with Dr. Tamala Julian. You will receive a reminder letter in the mail two months in advance. If you don't receive a letter, please call our office to schedule the follow-up appointment.   Any Other Special Instructions Will Be Listed Below (If Applicable).  If you see your blood pressure consistently running 140/90 or higher, please contact Dr. Morton Amy office.   If you need a refill on your cardiac medications before your next appointment, please call your pharmacy.      Signed, Sinclair Grooms, MD  08/21/2016 10:06 AM    [address] [place], [place], [place]  [address] Phone: [phone]; Fax: [phone]

## 2016-08-31 IMAGING — CT CT CTA ABD/PEL W/CM AND/OR W/O CM
3 of 10 series · 10 of 36 positions shown, 15 images · IV contrast (75CC ISOVUE 370)
Comparison: May 11, 2015.

CLINICAL DATA: Abdominal aortic aneurysm without rupture.

EXAM:
CTA ABDOMEN AND PELVIS wITHOUT AND WITH CONTRAST
TECHNIQUE: Multidetector CT imaging of the abdomen and pelvis was performed
using the standard protocol during bolus administration of
intravenous contrast. Multiplanar reconstructed images and MIPs were
obtained and reviewed to evaluate the vascular anatomy.
CONTRAST:  75 mL of Isovue 370 intravenously.

[Series 5: angio 2.5 · axial · 0.82mm/px · z∈[-402,-95]mm · 4 of 206 slices shown, 9 images]
[im 42/206  soft-tissue]
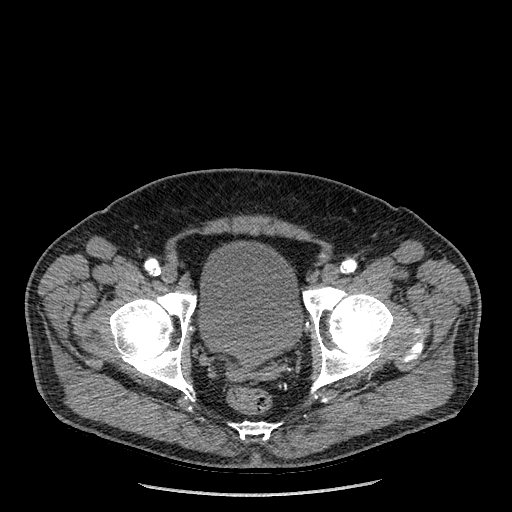
[im 42/206  lung]
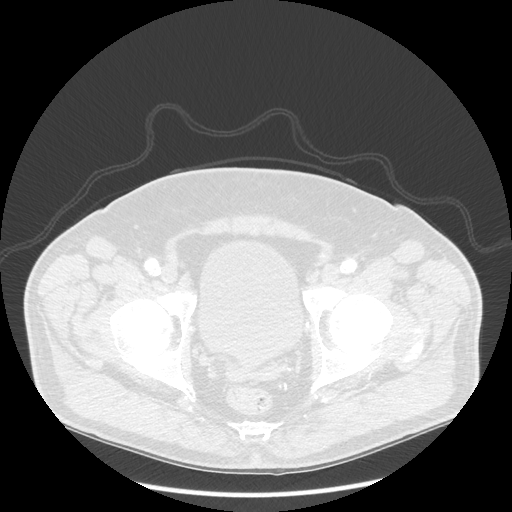
[im 42/206  bone]
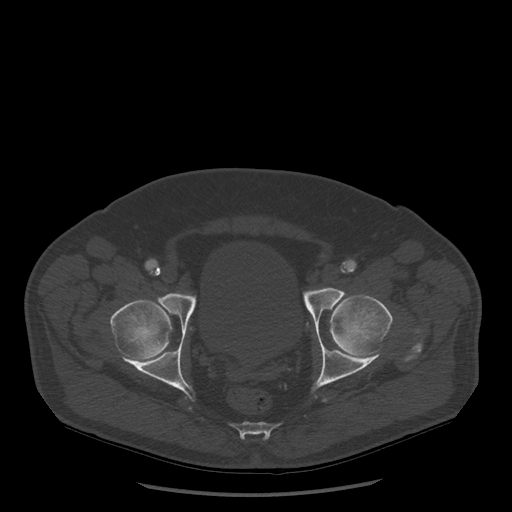
[im 83/206  soft-tissue]
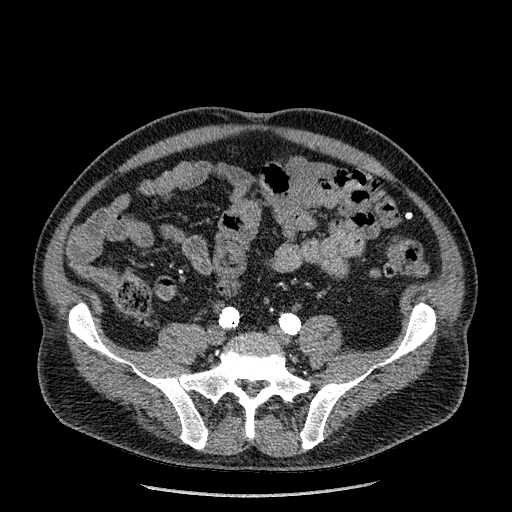
[im 83/206  lung]
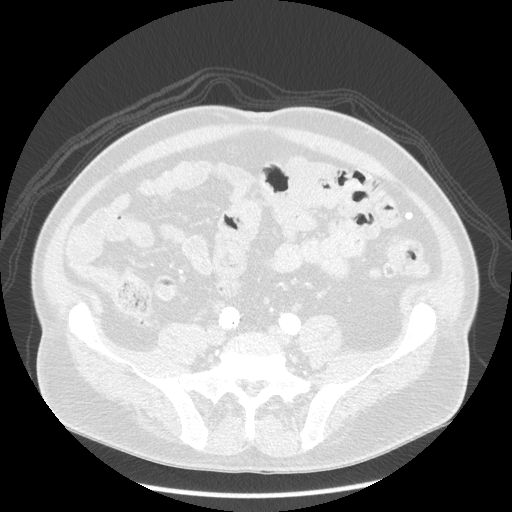
[im 124/206  soft-tissue]
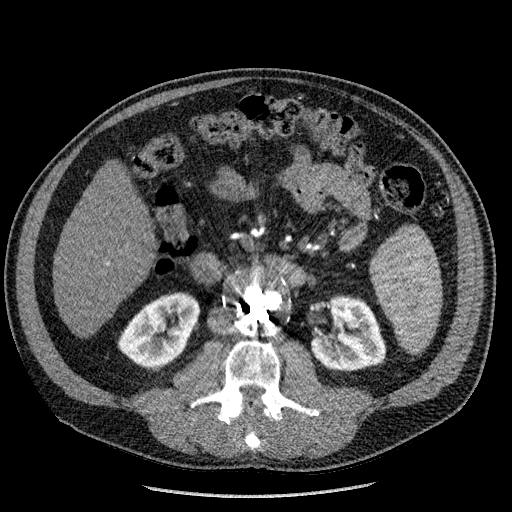
[im 124/206  lung]
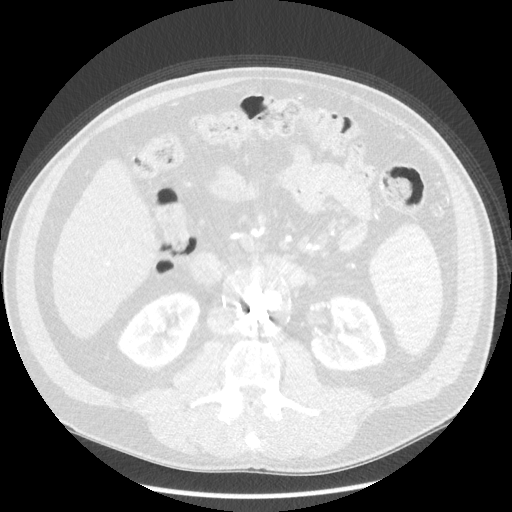
[im 165/206  soft-tissue]
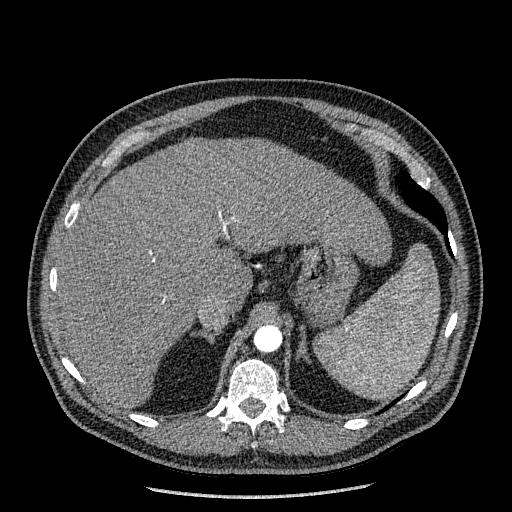
[im 165/206  lung]
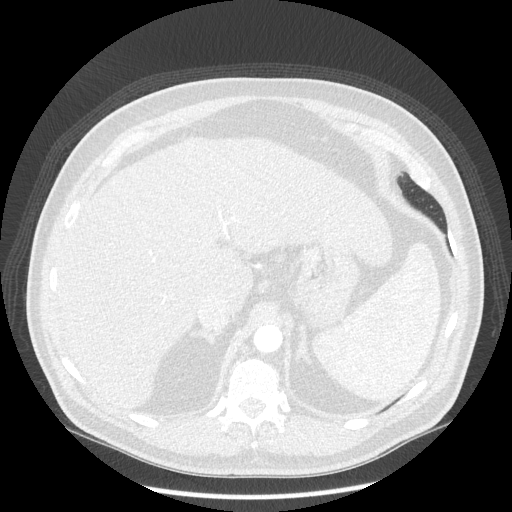

[Series 602: sagittal body · sagittal · 1.00mm/px · 3 of 169 slices shown (1 of 2)]
[im 43/169  soft-tissue]
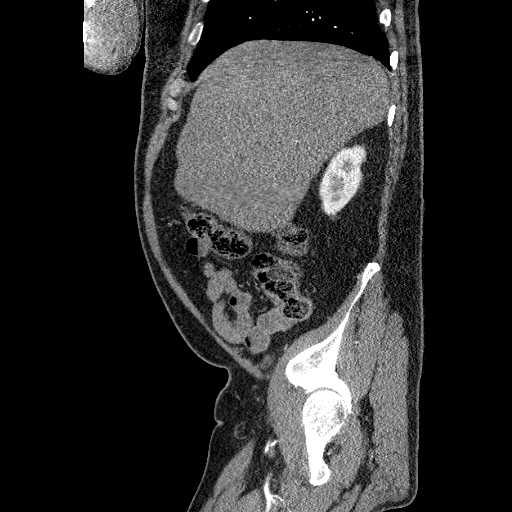
[im 85/169  soft-tissue]
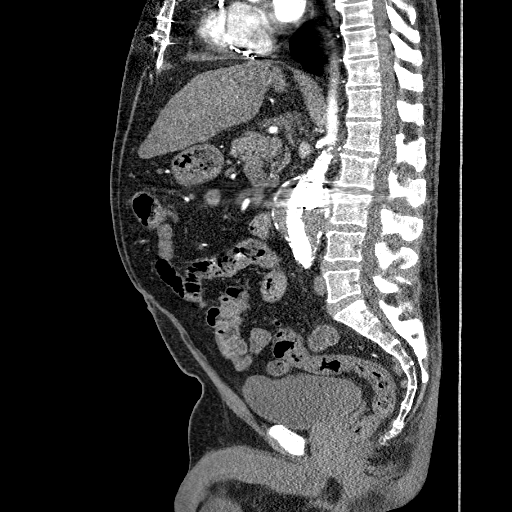
[im 127/169  soft-tissue]
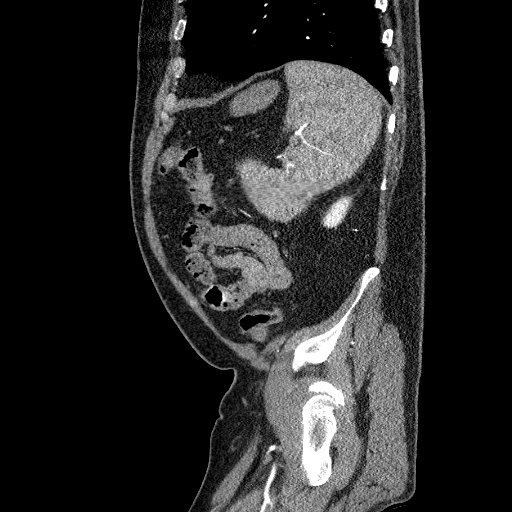

[Series 607: sagittal body · sagittal · 0.82mm/px · 3 of 165 slices shown (2 of 2)]
[im 42/165  soft-tissue]
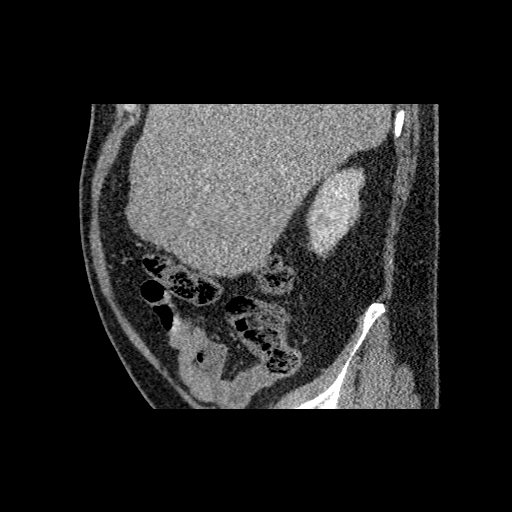
[im 83/165  soft-tissue]
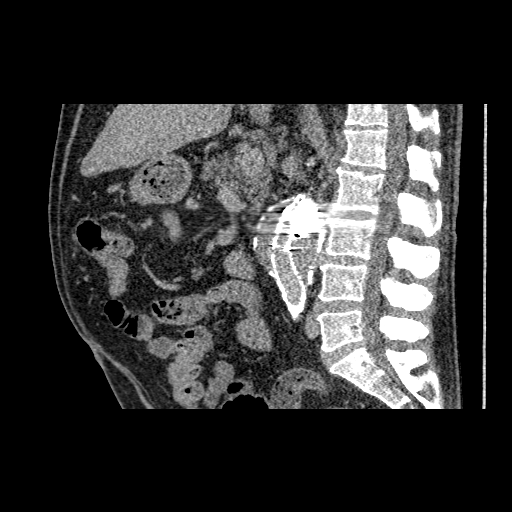
[im 124/165  soft-tissue]
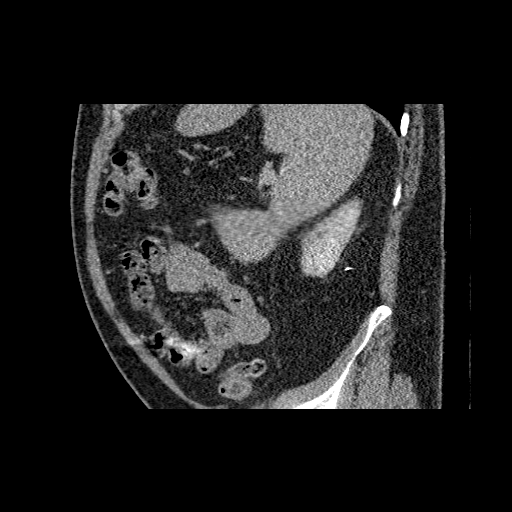

[10 of 36 positions shown; findings below may reference images not displayed]

FINDINGS: Visualized lung bases are unremarkable. Nodular hepatic margins are
noted concerning for hepatic cirrhosis. No gallstones are noted. The
spleen and pancreas appear normal. Adrenal glands and kidneys appear
normal. No hydronephrosis or renal obstruction is noted. The
appendix appears normal. Status post endovascular stent graft
placement for treatment of abdominal aortic aneurysm.

Embolization material is again noted in the proximal abdominal
aortic sac consistent with previously treated type 2 endoleak. The
graft and its limbs are widely patent. There is no evidence of
active endoleak. Excluded aneurysmal sac has maximum measured AP
diameter of 5.9 cm. The mesenteric and renal arteries are widely
patent.

Review of the MIP images confirms the above findings.
IMPRESSION: Status post endovascular repair of infrarenal abdominal aortic
aneurysm. Excluded aneurysmal sac has measured diameter of which is
not significantly changed. Status post previous percutaneous
embolization of type 2 endoleak.

Probable hepatic cirrhosis.

## 2016-09-13 ENCOUNTER — Other Ambulatory Visit: Payer: Self-pay | Admitting: Gastroenterology

## 2016-09-13 DIAGNOSIS — K7469 Other cirrhosis of liver: Secondary | ICD-10-CM

## 2016-09-20 ENCOUNTER — Ambulatory Visit
Admission: RE | Admit: 2016-09-20 | Discharge: 2016-09-20 | Disposition: A | Discharge status: Home / Self Care | Payer: Medicare HMO | Source: Ambulatory Visit | Attending: Gastroenterology | Admitting: Gastroenterology

## 2016-09-20 DIAGNOSIS — K7460 Unspecified cirrhosis of liver: Secondary | ICD-10-CM | POA: Diagnosis not present

## 2016-09-20 DIAGNOSIS — K7469 Other cirrhosis of liver: Secondary | ICD-10-CM

## 2016-10-08 DIAGNOSIS — I739 Peripheral vascular disease, unspecified: Secondary | ICD-10-CM | POA: Diagnosis not present

## 2016-10-08 DIAGNOSIS — M1990 Unspecified osteoarthritis, unspecified site: Secondary | ICD-10-CM | POA: Diagnosis not present

## 2016-10-08 DIAGNOSIS — D519 Vitamin B12 deficiency anemia, unspecified: Secondary | ICD-10-CM | POA: Diagnosis not present

## 2016-10-08 DIAGNOSIS — E1151 Type 2 diabetes mellitus with diabetic peripheral angiopathy without gangrene: Secondary | ICD-10-CM | POA: Diagnosis not present

## 2016-10-08 DIAGNOSIS — Z6829 Body mass index (BMI) 29.0-29.9, adult: Secondary | ICD-10-CM | POA: Diagnosis not present

## 2016-10-08 DIAGNOSIS — I2510 Atherosclerotic heart disease of native coronary artery without angina pectoris: Secondary | ICD-10-CM | POA: Diagnosis not present

## 2016-10-08 DIAGNOSIS — E668 Other obesity: Secondary | ICD-10-CM | POA: Diagnosis not present

## 2016-10-08 DIAGNOSIS — I119 Hypertensive heart disease without heart failure: Secondary | ICD-10-CM | POA: Diagnosis not present

## 2016-10-08 DIAGNOSIS — G458 Other transient cerebral ischemic attacks and related syndromes: Secondary | ICD-10-CM | POA: Diagnosis not present

## 2016-10-15 DIAGNOSIS — Z86010 Personal history of colonic polyps: Secondary | ICD-10-CM | POA: Diagnosis not present

## 2016-10-15 DIAGNOSIS — K7460 Unspecified cirrhosis of liver: Secondary | ICD-10-CM | POA: Diagnosis not present

## 2017-01-06 ENCOUNTER — Emergency Department (HOSPITAL_COMMUNITY): Payer: Medicare HMO

## 2017-01-06 ENCOUNTER — Encounter (HOSPITAL_COMMUNITY): Payer: Self-pay | Admitting: Emergency Medicine

## 2017-01-06 ENCOUNTER — Emergency Department (HOSPITAL_COMMUNITY)
Admission: EM | Admit: 2017-01-06 | Discharge: 2017-01-06 | Disposition: A | Discharge status: Home / Self Care | Payer: Medicare HMO | Attending: Emergency Medicine | Admitting: Emergency Medicine

## 2017-01-06 DIAGNOSIS — E119 Type 2 diabetes mellitus without complications: Secondary | ICD-10-CM | POA: Insufficient documentation

## 2017-01-06 DIAGNOSIS — I252 Old myocardial infarction: Secondary | ICD-10-CM | POA: Insufficient documentation

## 2017-01-06 DIAGNOSIS — T82330A Leakage of aortic (bifurcation) graft (replacement), initial encounter: Secondary | ICD-10-CM | POA: Insufficient documentation

## 2017-01-06 DIAGNOSIS — J449 Chronic obstructive pulmonary disease, unspecified: Secondary | ICD-10-CM | POA: Diagnosis not present

## 2017-01-06 DIAGNOSIS — Z79899 Other long term (current) drug therapy: Secondary | ICD-10-CM | POA: Insufficient documentation

## 2017-01-06 DIAGNOSIS — IMO0002 Reserved for concepts with insufficient information to code with codable children: Secondary | ICD-10-CM

## 2017-01-06 DIAGNOSIS — Z8673 Personal history of transient ischemic attack (TIA), and cerebral infarction without residual deficits: Secondary | ICD-10-CM | POA: Insufficient documentation

## 2017-01-06 DIAGNOSIS — Z7902 Long term (current) use of antithrombotics/antiplatelets: Secondary | ICD-10-CM | POA: Diagnosis not present

## 2017-01-06 DIAGNOSIS — Y718 Miscellaneous cardiovascular devices associated with adverse incidents, not elsewhere classified: Secondary | ICD-10-CM | POA: Diagnosis not present

## 2017-01-06 DIAGNOSIS — Z87891 Personal history of nicotine dependence: Secondary | ICD-10-CM | POA: Diagnosis not present

## 2017-01-06 DIAGNOSIS — I10 Essential (primary) hypertension: Secondary | ICD-10-CM | POA: Diagnosis not present

## 2017-01-06 DIAGNOSIS — T82898A Other specified complication of vascular prosthetic devices, implants and grafts, initial encounter: Secondary | ICD-10-CM | POA: Diagnosis not present

## 2017-01-06 DIAGNOSIS — I2510 Atherosclerotic heart disease of native coronary artery without angina pectoris: Secondary | ICD-10-CM | POA: Diagnosis not present

## 2017-01-06 DIAGNOSIS — K5730 Diverticulosis of large intestine without perforation or abscess without bleeding: Secondary | ICD-10-CM | POA: Diagnosis not present

## 2017-01-06 DIAGNOSIS — R109 Unspecified abdominal pain: Secondary | ICD-10-CM | POA: Insufficient documentation

## 2017-01-06 DIAGNOSIS — Z7982 Long term (current) use of aspirin: Secondary | ICD-10-CM | POA: Insufficient documentation

## 2017-01-06 DIAGNOSIS — Z7984 Long term (current) use of oral hypoglycemic drugs: Secondary | ICD-10-CM | POA: Diagnosis not present

## 2017-01-06 DIAGNOSIS — M545 Low back pain: Secondary | ICD-10-CM | POA: Diagnosis not present

## 2017-01-06 DIAGNOSIS — I714 Abdominal aortic aneurysm, without rupture: Secondary | ICD-10-CM | POA: Diagnosis not present

## 2017-01-06 LAB — BASIC METABOLIC PANEL
ANION GAP: 10 (ref 5–15)
BUN: 11 mg/dL (ref 6–20)
CALCIUM: 9.5 mg/dL (ref 8.9–10.3)
CO2: 26 mmol/L (ref 22–32)
CREATININE: 0.89 mg/dL (ref 0.61–1.24)
Chloride: 101 mmol/L (ref 101–111)
GFR calc Af Amer: >60 mL/min (ref >60)
GLUCOSE: 89 mg/dL (ref 65–99)
Potassium: 3.9 mmol/L (ref 3.5–5.1)
Sodium: 137 mmol/L (ref 135–145)

## 2017-01-06 LAB — CBC WITH DIFFERENTIAL/PLATELET
BASOS ABS: 0 10*3/uL (ref 0.0–0.1)
BASOS PCT: 1 %
EOS ABS: 0.1 10*3/uL (ref 0.0–0.7)
EOS PCT: 2 %
HCT: 43.8 % (ref 39.0–52.0)
Hemoglobin: 14.9 g/dL (ref 13.0–17.0)
Lymphocytes Relative: 41 %
Lymphs Abs: 2 10*3/uL (ref 0.7–4.0)
MCH: 30.7 pg (ref 26.0–34.0)
MCHC: 34 g/dL (ref 30.0–36.0)
MCV: 90.1 fL (ref 78.0–100.0)
MONO ABS: 0.4 10*3/uL (ref 0.1–1.0)
Monocytes Relative: 7 %
NEUTROS ABS: 2.4 10*3/uL (ref 1.7–7.7)
Neutrophils Relative %: 49 %
Platelets: 62 10*3/uL — ABNORMAL LOW (ref 150–400)
RBC: 4.86 MIL/uL (ref 4.22–5.81)
RDW: 13.2 % (ref 11.5–15.5)
WBC: 4.8 10*3/uL (ref 4.0–10.5)

## 2017-01-06 LAB — URINALYSIS, ROUTINE W REFLEX MICROSCOPIC
Bacteria, UA: NONE SEEN
Bilirubin Urine: NEGATIVE
Hgb urine dipstick: NEGATIVE
KETONES UR: 5 mg/dL — AB
LEUKOCYTES UA: NEGATIVE
Nitrite: NEGATIVE
PROTEIN: NEGATIVE mg/dL
SQUAMOUS EPITHELIAL / LPF: NONE SEEN
Specific Gravity, Urine: 1.029 (ref 1.005–1.030)
WBC, UA: NONE SEEN WBC/hpf (ref 0–5)
pH: 6 (ref 5.0–8.0)

## 2017-01-06 MED ORDER — FENTANYL CITRATE (PF) 100 MCG/2ML IJ SOLN
50.0000 ug | Freq: Once | INTRAMUSCULAR | Status: AC
  Administered 2017-01-06: 50 ug via INTRAVENOUS
  Filled 2017-01-06: qty 2

## 2017-01-06 MED ORDER — IOPAMIDOL (ISOVUE-370) INJECTION 76%
INTRAVENOUS | Status: AC
  Administered 2017-01-06: 100 mL
  Filled 2017-01-06: qty 100

## 2017-01-06 MED ORDER — SODIUM CHLORIDE 0.9 % IV SOLN
INTRAVENOUS | Status: DC
  Administered 2017-01-06: 18:00:00 via INTRAVENOUS

## 2017-01-06 MED ORDER — OXYCODONE-ACETAMINOPHEN 5-325 MG PO TABS: 1.0000 | ORAL_TABLET | Freq: Four times a day (QID) | ORAL | 0 refills | Status: DC | PRN

## 2017-01-06 NOTE — ED Triage Notes (Signed)
Pt. Stated, Since 630 this morning Ive had rt. Flank pain , just a certain way I move, turn , or take a deep breath.  I do have a triple A.

## 2017-01-06 NOTE — ED Notes (Signed)
MD at bedside. 

## 2017-01-06 NOTE — ED Notes (Signed)
Patient ambulatory to restroom with steady gait.

## 2017-01-06 NOTE — Consult Note (Signed)
Hospital Consult    Reason for Consult:  Right flank pain with type II endoleak Referring Physician:  ED MRN #:  [id number]  History of Present Illness: This is a 61 y.o. male with history of EVAR and subsequent type II endoleak repair. He now presents with today history of right flank pain that is severe in nature, does not radiate, alleviated with sitting still, exacerbated with any movement. First occurred similarly 2 weeks ago but was self-limited. Has not had prior to 2 weeks ago. No abdominal pain. No constitutional symptoms.   Past Medical History:  Diagnosis Date  . AAA (abdominal aortic aneurysm) ([place])   . Anginal pain ([place])   . Bulging lumbar disc    "& some in my neck"  . CAD (coronary artery disease)   . Change in platelet count    pt states 'has been low since 2012; patient had to receive platelets in surgery in Nov 2016'  . Chronic back pain   . Cirrhosis ([place])   . COPD (chronic obstructive pulmonary disease) ([place])   . Diabetic peripheral neuropathy ([place])   . Emphysema lung ([place])   . Enlarged liver   . Enlarged prostate   . GERD (gastroesophageal reflux disease)   . Hardening of the arteries of the brain   . Headache    "~ weekly" (04/12/2015)  . Heart attack [facility]) Nov. 15,2016   TIA  . History of colon polyps   . Hyperlipidemia   . Hypertension   . Leg pain    with walking and pain in feet while lying flat  . Memory loss    Due to hardening of the arteries in the brain  . Myocardial infarction ([place])    "I've had probably 6-7" (04/12/2015)  . Restless leg syndrome   . Shortness of breath 12/06/10   while lying flat and with exertion  . Snoring   . Spleen enlarged   . Stroke [facility]) 2004   denies residual on 04/12/2015  . Thrombocytopenia ([place])    since 2012; can be as low as 54K.   [name] TIA (transient ischemic attack) "several"   "since 2015" (04/12/2015)  . Type II diabetes mellitus ([place]) dx'd 2009    Past Surgical History:  Procedure Laterality Date  .  ABDOMINAL AORTIC ANEURYSM REPAIR  01-16-11   EVAR  . ABDOMINAL AORTIC ANEURYSM REPAIR  04/12/2015  . CARDIAC CATHETERIZATION  "several"  . COLONOSCOPY    . COLONOSCOPY W/ BIOPSIES    . COLONOSCOPY WITH PROPOFOL N/A 03/21/2016   Procedure: COLONOSCOPY WITH PROPOFOL;  Surgeon: Arta Silence, MD;  Location: [facility] ENDOSCOPY;  Service: Endoscopy;  Laterality: N/A;  . CORONARY ANGIOPLASTY WITH STENT PLACEMENT  "several"   > 4 sents  . CORONARY ARTERY BYPASS GRAFT  02/18/2001   "CABG x 4"  . Direct sac puncture with liquid embolic repair of Type II endoleak  04/12/2015  . ESOPHAGOGASTRODUODENOSCOPY (EGD) WITH PROPOFOL N/A 03/21/2016   Procedure: ESOPHAGOGASTRODUODENOSCOPY (EGD) WITH PROPOFOL;  Surgeon: Arta Silence, MD;  Location: [facility] ENDOSCOPY;  Service: Endoscopy;  Laterality: N/A;  . PERIPHERAL VASCULAR CATHETERIZATION N/A 03/10/2015   Procedure: Abdominal Aortogram;  Surgeon: Elam Dutch, MD;  Location: [facility] CV LAB;  Service: Cardiovascular;  Laterality: N/A;  . RADIOLOGY WITH ANESTHESIA N/A 04/12/2015   Procedure: embolization;  Surgeon: Jacqulynn Cadet, MD;  Location: [place];  Service: Radiology;  Laterality: N/A;  . SHOULDER SURGERY Right 07-27-2015  . TONSILLECTOMY  ~ 1965    No Known Allergies  Prior to Admission medications   Medication Sig Start Date End Date Taking? Authorizing Provider  albuterol (PROVENTIL HFA;VENTOLIN HFA) 108 (90 BASE) MCG/ACT inhaler Inhale 2 puffs into the lungs daily as needed for wheezing or shortness of breath.   Yes [provider]  aspirin EC 81 MG tablet Take 1 tablet (81 mg total) by mouth daily. 03/22/16  Yes Arta Silence, MD  cholecalciferol (VITAMIN D) 1000 UNITS tablet Take 1,000 Units by mouth daily.    Yes [provider]  clonazePAM (KLONOPIN) 1 MG tablet Take 1 mg by mouth at bedtime.   Yes [provider]  clopidogrel (PLAVIX) 75 MG tablet Take 1 tablet (75 mg total) by mouth daily. 03/25/16  Yes Arta Silence, MD  cyanocobalamin (,VITAMIN B-12,) 1000 MCG/ML injection Inject 1,000 mcg into the muscle See admin instructions. Every 30 to 60 days   Yes [provider]  empagliflozin (JARDIANCE) 10 MG TABS tablet Take 10 mg by mouth daily.   Yes [provider]  escitalopram (LEXAPRO) 20 MG tablet Take 20 mg by mouth at bedtime.   Yes [provider]  ezetimibe-simvastatin (VYTORIN) 10-40 MG per tablet Take 1 tablet by mouth at bedtime.    Yes [provider]  fenofibrate 160 MG tablet Take 160 mg by mouth daily.  11/03/10  Yes [provider]  Fluticasone-Salmeterol (ADVAIR) 100-50 MCG/DOSE AEPB Inhale 1 puff into the lungs 2 (two) times daily.    Yes [provider]  furosemide (LASIX) 40 MG tablet Take 40 mg by mouth every other day.    Yes [provider]  glimepiride (AMARYL) 4 MG tablet Take 4 mg by mouth every evening.    Yes [provider]  lansoprazole (PREVACID) 30 MG capsule Take 30 mg by mouth daily.  03/11/14  Yes [provider]  losartan (COZAAR) 50 MG tablet Take 50 mg by mouth daily.     Yes [provider]  metFORMIN (GLUCOPHAGE) 1000 MG tablet Take 1,000 mg by mouth 2 (two) times daily. Breakfast and at bedtime   Yes [provider]  metoprolol (LOPRESSOR) 50 MG tablet Take 50 mg by mouth 2 (two) times daily.    Yes [provider]  nitroGLYCERIN (NITROSTAT) 0.4 MG SL tablet Place 0.4 mg under the tongue every 5 (five) minutes as needed for chest pain.   Yes [provider]  oxyCODONE-acetaminophen (PERCOCET/ROXICET) 5-325 MG tablet 1 or 2 tabs PO q6h prn pain Patient taking differently: Take 1 tablet by mouth every 6 (six) hours as needed for moderate pain.  02/16/16  Yes Francine Graven, DO  pramipexole (MIRAPEX) 0.25 MG tablet Take 0.25 mg by mouth at bedtime.  03/24/12  Yes [provider]  pregabalin (LYRICA) 100 MG capsule Take 100-200 mg by mouth See  admin instructions. Take 1 tablet (100 mg) by mouth every morning and 2 tablets (200 mg) at bedtime   Yes [provider]  ranolazine (RANEXA) 1000 MG SR tablet Take 1,000 mg by mouth 2 (two) times daily.     Yes [provider]  terazosin (HYTRIN) 1 MG capsule Take 1 mg by mouth at bedtime.    Yes [provider]  tiotropium (SPIRIVA) 18 MCG inhalation capsule Place 18 mcg into inhaler and inhale daily.    Yes [provider]    Social History   Social History  . Marital status: Married    Spouse name: reba  . Number of children: 2  . Years  of education: 9   Occupational History  . disability    Social History Main Topics  . Smoking status: Former Smoker    Packs/day: 0.00    Years: 30.00    Types: Cigarettes    Quit date: 02/17/2001  . Smokeless tobacco: Never Used  . Alcohol use Yes     Comment: occasional  . Drug use: No  . Sexual activity: Not Currently   Other Topics Concern  . Not on file   Social History Narrative   Patient lives at home with his wife.     Family History  Problem Relation Age of Onset  . Aneurysm Mother        AAA  . Heart disease Mother        Heart Disease before age 51  . Other Mother        Carotid artery stenosis  . Hyperlipidemia Mother   . Hypertension Mother   . Lupus Sister   . Cancer Sister        Ovarian  . Heart disease Sister   . Heart disease Brother        Heart Disease before age 71  . Hypertension Brother   . Heart attack Brother   . Heart disease Brother        Before age 71  . Heart attack Brother   . Heart attack Brother   . Hyperlipidemia Father   . Hypertension Father   . Aneurysm Maternal Aunt        AAA  . Other Maternal Aunt        AAA  . Aneurysm Maternal Uncle        AAA  . Other Maternal Uncle        AAA    ROS: [x]  Positive   [ ]  Negative   [ ]  All sytems reviewed and are negative  Cardiovascular: []  chest pain/pressure []  palpitations []  SOB lying  flat []  DOE []  pain in legs while walking []  pain in legs at rest []  pain in legs at night []  non-healing ulcers []  hx of DVT []  swelling in legs  Pulmonary: []  productive cough []  asthma/wheezing []  home O2  Neurologic: []  weakness in []  arms []  legs []  numbness in []  arms []  legs []  hx of CVA []  mini stroke [] difficulty speaking or slurred speech []  temporary loss of vision in one eye []  dizziness  Hematologic: []  hx of cancer []  bleeding problems []  problems with blood clotting easily  Endocrine:   []  diabetes []  thyroid disease  GI []  vomiting blood []  blood in stool [x]  flank pain  GU: []  CKD/renal failure []  HD--[]  M/W/F or []  T/T/S []  burning with urination []  blood in urine  Psychiatric: []  anxiety []  depression  Musculoskeletal: []  arthritis []  joint pain  Integumentary: []  rashes []  ulcers  Constitutional: []  fever []  chills   Physical Examination  Vitals:   01/06/17 2145 01/06/17 2200  BP: (!) 131/56 129/60  Pulse: (!) 52 (!) 53  Resp: 12 13  Temp:     Body mass index is 29.71 kg/m.  General:  WDWN in NAD HENT: WNL, normocephalic Pulmonary: normal non-labored breathing, without Rales, rhonchi,  wheezing Cardiac: palpable femoral, popliteal, dp pulses Abdomen: soft, NT/ND, no masses, no flank ttp Musculoskeletal: no muscle wasting or atrophy  Neurologic: A&O X 3; SENSATION: normal; MOTOR FUNCTION:  moving all extremities equally. Speech is fluent/normal Psychiatric: appropriate mood and affect  CBC    Component Value Date/Time  WBC 4.8 01/06/2017 1813   RBC 4.86 01/06/2017 1813   HGB 14.9 01/06/2017 1813   HGB 14.1 04/24/2016 0936   HCT 43.8 01/06/2017 1813   HCT 42.0 04/24/2016 0936   PLT 62 (L) 01/06/2017 1813   PLT 73 (L) 04/24/2016 0936   MCV 90.1 01/06/2017 1813   MCV 89.6 04/24/2016 0936   MCH 30.7 01/06/2017 1813   MCHC 34.0 01/06/2017 1813   RDW 13.2 01/06/2017 1813   RDW 13.6 04/24/2016 0936   LYMPHSABS  2.0 01/06/2017 1813   LYMPHSABS 2.0 04/24/2016 0936   MONOABS 0.4 01/06/2017 1813   MONOABS 0.6 04/24/2016 0936   EOSABS 0.1 01/06/2017 1813   EOSABS 0.1 04/24/2016 0936   BASOSABS 0.0 01/06/2017 1813   BASOSABS 0.0 04/24/2016 0936    BMET    Component Value Date/Time   NA 137 01/06/2017 1813   NA 137 04/24/2016 0936   K 3.9 01/06/2017 1813   K 4.5 04/24/2016 0936   CL 101 01/06/2017 1813   CO2 26 01/06/2017 1813   CO2 23 04/24/2016 0936   GLUCOSE 89 01/06/2017 1813   GLUCOSE 217 (H) 04/24/2016 0936   BUN 11 01/06/2017 1813   BUN 11.6 04/24/2016 0936   CREATININE 0.89 01/06/2017 1813   CREATININE 1.0 04/24/2016 0936   CALCIUM 9.5 01/06/2017 1813   CALCIUM 9.3 04/24/2016 0936   GFRNONAA >60 01/06/2017 1813   GFRAA >60 01/06/2017 1813    COAGS: Lab Results  Component Value Date   INR 1.10 02/16/2016   INR 1.18 07/18/2015   INR 1.19 04/04/2015     Non-Invasive Vascular Imaging:   IMPRESSION: 1. Recurrent endoleak in the posterior aspect of an otherwise thrombosed native abdominal aortic aneurysm with an aorto bi-iliac stent graft. The leaked contrast is at and just below the level of previously placed embolization material. 2. Again demonstrated are changes of cirrhosis of the liver with associated splenomegaly. 3. Colonic diverticulosis. 4. Extensive calcified coronary artery atherosclerosis as well as calcified aortic atherosclerosis.    ASSESSMENT/PLAN: This is a 61 y.o. male  With history of EVAR and type II endoleak. He has a type II endoleak from a likely lumbar artery occurring at the level of the previous embolization. The aorta appears similar in size to previous CT last September and does not appear to have component of rupture to account for pain. Will get him to follow up with Dr. Oneida Alar prior to scheduled appointment in September.   Destanee Bedonie C. Donzetta Matters, MD [facility] Office: [phone] Pager: [phone]

## 2017-01-06 NOTE — ED Notes (Signed)
Patient verbalized understanding of discharge instructions and denies any further needs or questions at this time. VS stable. Patient ambulatory with steady gait.  

## 2017-01-06 NOTE — ED Provider Notes (Signed)
[facility] DEPT Provider Note   CSN: [id number] Arrival date & time: 01/06/17  1326     History   Chief Complaint Chief Complaint  Patient presents with  . Flank Pain    HPI JDYN PARKERSON is a 61 y.o. male.  HPI  Patient presents with concern of right flank pain. Pain began over the past day, since onset has been persistent, worse with motion.  Pain is focally in the right flank without radiation.  There is no associated dysuria, hematuria. There is no associated abdominal pain, chest pain, dyspnea, no weakness or numbness, tingling in any extremity. Patient states the pain is similar to that he is experienced during prior lesion that developed following aorto biiliac graft complication. Today, after speaking with the vascular team on-call he was referred here for evaluation.  Past Medical History:  Diagnosis Date  . AAA (abdominal aortic aneurysm) ([place])   . Anginal pain ([place])   . Bulging lumbar disc    "& some in my neck"  . CAD (coronary artery disease)   . Change in platelet count    pt states 'has been low since 2012; patient had to receive platelets in surgery in Nov 2016'  . Chronic back pain   . Cirrhosis ([place])   . COPD (chronic obstructive pulmonary disease) ([place])   . Diabetic peripheral neuropathy ([place])   . Emphysema lung ([place])   . Enlarged liver   . Enlarged prostate   . GERD (gastroesophageal reflux disease)   . Hardening of the arteries of the brain   . Headache    "~ weekly" (04/12/2015)  . Heart attack [facility]) Nov. 15,2016   TIA  . History of colon polyps   . Hyperlipidemia   . Hypertension   . Leg pain    with walking and pain in feet while lying flat  . Memory loss    Due to hardening of the arteries in the brain  . Myocardial infarction ([place])    "I've had probably 6-7" (04/12/2015)  . Restless leg syndrome   . Shortness of breath 12/06/10   while lying flat and with exertion  . Snoring   . Spleen enlarged   . Stroke [facility]) 2004   denies  residual on 04/12/2015  . Thrombocytopenia ([place])    since 2012; can be as low as 54K.   [name] TIA (transient ischemic attack) "several"   "since 2015" (04/12/2015)  . Type II diabetes mellitus ([place]) dx'd 2009    Patient Active Problem List   Diagnosis Date Noted  . CAD (coronary artery disease) of artery bypass graft 07/12/2015  . Abdominal aortic aneurysm (AAA) without rupture ([place])   . Endoleak of aortic graft ([place])   . Soreness-type pain-Abdomin 02/10/2014  . Genital herpes 12/03/2013  . Balanitis 11/30/2013  . Syphilis, late latent 11/24/2013  . Mild cognitive impairment 11/16/2013  . Other and unspecified hyperlipidemia 05/03/2013  . Essential hypertension, benign 05/03/2013  . Weakness of limb-Left >than right 02/04/2013  . Shortness of breath 02/04/2013  . Pain in limb-Bilat leg 02/04/2013  . Hemiplegia affecting nondominant side ([place]) 11/12/2012  . Restless legs syndrome (RLS) 11/12/2012  . Lumbar back pain 11/12/2012  . Transient left leg weakness 11/12/2012  . Back pain 11/12/2012  . Cervicalgia 11/12/2012  . Memory loss 11/12/2012    Past Surgical History:  Procedure Laterality Date  . ABDOMINAL AORTIC ANEURYSM REPAIR  01-16-11   EVAR  . ABDOMINAL AORTIC ANEURYSM REPAIR  04/12/2015  . CARDIAC CATHETERIZATION  "  several"  . COLONOSCOPY    . COLONOSCOPY W/ BIOPSIES    . COLONOSCOPY WITH PROPOFOL N/A 03/21/2016   Procedure: COLONOSCOPY WITH PROPOFOL;  Surgeon: Arta Silence, MD;  Location: [facility] ENDOSCOPY;  Service: Endoscopy;  Laterality: N/A;  . CORONARY ANGIOPLASTY WITH STENT PLACEMENT  "several"   > 4 sents  . CORONARY ARTERY BYPASS GRAFT  02/18/2001   "CABG x 4"  . Direct sac puncture with liquid embolic repair of Type II endoleak  04/12/2015  . ESOPHAGOGASTRODUODENOSCOPY (EGD) WITH PROPOFOL N/A 03/21/2016   Procedure: ESOPHAGOGASTRODUODENOSCOPY (EGD) WITH PROPOFOL;  Surgeon: Arta Silence, MD;  Location: [facility] ENDOSCOPY;  Service: Endoscopy;  Laterality: N/A;  .  PERIPHERAL VASCULAR CATHETERIZATION N/A 03/10/2015   Procedure: Abdominal Aortogram;  Surgeon: Elam Dutch, MD;  Location: [facility] CV LAB;  Service: Cardiovascular;  Laterality: N/A;  . RADIOLOGY WITH ANESTHESIA N/A 04/12/2015   Procedure: embolization;  Surgeon: Jacqulynn Cadet, MD;  Location: [facility];  Service: Radiology;  Laterality: N/A;  . SHOULDER SURGERY Right 07-27-2015  . TONSILLECTOMY  ~ 1965       Home Medications    Prior to Admission medications   Medication Sig Start Date End Date Taking? Authorizing Provider  albuterol (PROVENTIL HFA;VENTOLIN HFA) 108 (90 BASE) MCG/ACT inhaler Inhale 2 puffs into the lungs daily as needed for wheezing or shortness of breath.   Yes [provider]  aspirin EC 81 MG tablet Take 1 tablet (81 mg total) by mouth daily. 03/22/16  Yes Arta Silence, MD  cholecalciferol (VITAMIN D) 1000 UNITS tablet Take 1,000 Units by mouth daily.    Yes [provider]  clonazePAM (KLONOPIN) 1 MG tablet Take 1 mg by mouth at bedtime.   Yes [provider]  clopidogrel (PLAVIX) 75 MG tablet Take 1 tablet (75 mg total) by mouth daily. 03/25/16  Yes Arta Silence, MD  cyanocobalamin (,VITAMIN B-12,) 1000 MCG/ML injection Inject 1,000 mcg into the muscle See admin instructions. Every 30 to 60 days   Yes [provider]  empagliflozin (JARDIANCE) 10 MG TABS tablet Take 10 mg by mouth daily.   Yes [provider]  escitalopram (LEXAPRO) 20 MG tablet Take 20 mg by mouth at bedtime.   Yes [provider]  ezetimibe-simvastatin (VYTORIN) 10-40 MG per tablet Take 1 tablet by mouth at bedtime.    Yes [provider]  fenofibrate 160 MG tablet Take 160 mg by mouth daily.  11/03/10  Yes [provider]  Fluticasone-Salmeterol (ADVAIR) 100-50 MCG/DOSE AEPB Inhale 1 puff into the lungs 2 (two) times daily.    Yes [provider]  furosemide (LASIX) 40 MG tablet Take 40 mg by mouth every  other day.    Yes [provider]  glimepiride (AMARYL) 4 MG tablet Take 4 mg by mouth every evening.    Yes [provider]  lansoprazole (PREVACID) 30 MG capsule Take 30 mg by mouth daily.  03/11/14  Yes [provider]  losartan (COZAAR) 50 MG tablet Take 50 mg by mouth daily.     Yes [provider]  metFORMIN (GLUCOPHAGE) 1000 MG tablet Take 1,000 mg by mouth 2 (two) times daily. Breakfast and at bedtime   Yes [provider]  metoprolol (LOPRESSOR) 50 MG tablet Take 50 mg by mouth 2 (two) times daily.    Yes [provider]  nitroGLYCERIN (NITROSTAT) 0.4 MG SL tablet Place 0.4 mg under the tongue every 5 (five) minutes as needed for chest pain.   Yes  [provider]  pramipexole (MIRAPEX) 0.25 MG tablet Take 0.25 mg by mouth at bedtime.  03/24/12  Yes [provider]  pregabalin (LYRICA) 100 MG capsule Take 100-200 mg by mouth See admin instructions. Take 1 tablet (100 mg) by mouth every morning and 2 tablets (200 mg) at bedtime   Yes [provider]  ranolazine (RANEXA) 1000 MG SR tablet Take 1,000 mg by mouth 2 (two) times daily.     Yes [provider]  terazosin (HYTRIN) 1 MG capsule Take 1 mg by mouth at bedtime.    Yes [provider]  tiotropium (SPIRIVA) 18 MCG inhalation capsule Place 18 mcg into inhaler and inhale daily.    Yes [provider]  oxyCODONE-acetaminophen (PERCOCET/ROXICET) 5-325 MG tablet Take 1 tablet by mouth every 6 (six) hours as needed for moderate pain. 01/06/17   Carmin Muskrat, MD    Family History Family History  Problem Relation Age of Onset  . Aneurysm Mother        AAA  . Heart disease Mother        Heart Disease before age 85  . Other Mother        Carotid artery stenosis  . Hyperlipidemia Mother   . Hypertension Mother   . Lupus Sister   . Cancer Sister        Ovarian  . Heart disease Sister   . Heart disease Brother        Heart  Disease before age 71  . Hypertension Brother   . Heart attack Brother   . Heart disease Brother        Before age 3  . Heart attack Brother   . Heart attack Brother   . Hyperlipidemia Father   . Hypertension Father   . Aneurysm Maternal Aunt        AAA  . Other Maternal Aunt        AAA  . Aneurysm Maternal Uncle        AAA  . Other Maternal Uncle        AAA    Social History Social History  Substance Use Topics  . Smoking status: Former Smoker    Packs/day: 0.00    Years: 30.00    Types: Cigarettes    Quit date: 02/17/2001  . Smokeless tobacco: Never Used  . Alcohol use Yes     Comment: occasional     Allergies   Patient has no known allergies.   Review of Systems Review of Systems  Constitutional:       Per HPI, otherwise negative  HENT:       Per HPI, otherwise negative  Respiratory:       Per HPI, otherwise negative  Cardiovascular:       Per HPI, otherwise negative  Gastrointestinal: Negative for vomiting.  Endocrine:       Negative aside from HPI  Genitourinary:       Neg aside from HPI   Musculoskeletal:       Per HPI, otherwise negative  Skin: Negative.   Neurological: Negative for syncope.     Physical Exam Updated Vital Signs BP (!) 128/58   Pulse (!) 53   Temp 98 F (36.7 C) (Oral)   Resp 16   Ht 5\' 11"  (1.803 m)   Wt 96.6 kg (213 lb)   SpO2 94%   BMI 29.71 kg/m   Physical Exam  Constitutional: He is oriented to person, place, and time. He appears  well-developed. No distress.  HENT:  Head: Normocephalic and atraumatic.  Eyes: Conjunctivae and EOM are normal.  Cardiovascular: Normal rate and regular rhythm.   Pulmonary/Chest: Effort normal. No stridor. No respiratory distress.  Abdominal: He exhibits no distension. There is no tenderness.  Musculoskeletal: He exhibits no edema.  Neurological: He is alert and oriented to person, place, and time.  Skin: Skin is warm and dry.  Psychiatric: He has a normal mood and affect.    Nursing note and vitals reviewed.    ED Treatments / Results  Labs (all labs ordered are listed, but only abnormal results are displayed) Labs Reviewed  URINALYSIS, ROUTINE W REFLEX MICROSCOPIC - Abnormal; Notable for the following:       Result Value   Glucose, UA >=500 (*)    Ketones, ur 5 (*)    All other components within normal limits  CBC WITH DIFFERENTIAL/PLATELET - Abnormal; Notable for the following:    Platelets 62 (*)    All other components within normal limits  BASIC METABOLIC PANEL     Radiology Ct Angio Chest/abd/pel For Dissection W And/or Wo Contrast  Result Date: 01/06/2017 CLINICAL DATA:  Status post abdominal aortic repair for aneurysm. The patient now complains of similar pain in the right back. EXAM: CT ANGIOGRAPHY CHEST, ABDOMEN AND PELVIS TECHNIQUE: Multidetector CT imaging through the chest, abdomen and pelvis was performed using the standard protocol during bolus administration of intravenous contrast. Multiplanar reconstructed images and MIPs were obtained and reviewed to evaluate the vascular anatomy. CONTRAST:  100 cc Isovue 370 COMPARISON:  02/16/2016. FINDINGS: CTA CHEST FINDINGS Cardiovascular: Extensive dense coronary artery calcifications. Post CABG changes. Aortic calcification. Normal caliber thoracic aorta without dissection or aneurysm. The examination was not tailored for visualization of the pulmonary arteries. No pulmonary arterial filling defects are seen. Mediastinum/Nodes: No enlarged mediastinal, hilar, or axillary lymph nodes. Thyroid gland, trachea, and esophagus demonstrate no significant findings. Lungs/Pleura: Clear lungs.  No pleural fluid. Musculoskeletal: Thoracic spine degenerative changes. Review of the MIP images confirms the above findings. CTA ABDOMEN AND PELVIS FINDINGS VASCULAR Aorta: Aorto bi-iliac graft within a large, predominantly thrombosed native aorta. The aorta measures 6.3 cm in maximum diameter. There is an interval small  amount of contrast opacification within the thrombosed portion of the aorta posteriorly. This is at and just below the level of dense embolization material. This is best seen on image number 135 of series 6. Celiac: Patent without stenosis. SMA: Patent without stenosis. Renals: 2 left renal arteries and 1 right renal artery, patent without stenosis. IMA: Arising from the thrombosed native aortic aneurysm without opacification. Inflow: Atheromatous changes without significant luminal narrowing. Veins: Normally opacified renal veins and proximal inferior vena cava. Review of the MIP images confirms the above findings. NON-VASCULAR Hepatobiliary: Mild diffuse low density of the liver relative to the spleen. The liver margins are minimally lobulated with a prominent lateral segment left lobe and prominent caudate lobe. Distended, normal appearing gallbladder. Pancreas: Unremarkable. No pancreatic ductal dilatation or surrounding inflammatory changes. Spleen: Enlarged, measuring 17.7 cm in length. Adrenals/Urinary Tract: Adrenal glands are unremarkable. Kidneys are normal, without renal calculi, focal lesion, or hydronephrosis. Bladder is unremarkable. Stomach/Bowel: Multiple descending sigmoid colon diverticula without evidence of diverticulitis. Unremarkable small bowel and stomach. No evidence of appendicitis. Lymphatic: No enlarged lymph nodes. Reproductive: Mildly enlarged prostate gland with coarse calcification. Other: No abdominal wall hernia or abnormality. No abdominopelvic ascites. Musculoskeletal: Mild lumbar spine degenerative changes. Review of the MIP images confirms the above findings.  IMPRESSION: 1. Recurrent endoleak in the posterior aspect of an otherwise thrombosed native abdominal aortic aneurysm with an aorto bi-iliac stent graft. The leaked contrast is at and just below the level of previously placed embolization material. 2. Again demonstrated are changes of cirrhosis of the liver with associated  splenomegaly. 3. Colonic diverticulosis. 4. Extensive calcified coronary artery atherosclerosis as well as calcified aortic atherosclerosis. Electronically Signed   By: Claudie Revering M.D.   On: 01/06/2017 20:13    Procedures Procedures (including critical care time)  Medications Ordered in ED Medications  0.9 %  sodium chloride infusion ( Intravenous New Bag/Given 01/06/17 1821)  fentaNYL (SUBLIMAZE) injection 50 mcg (50 mcg Intravenous Given 01/06/17 1812)  iopamidol (ISOVUE-370) 76 % injection (100 mLs  Contrast Given 01/06/17 1928)  fentaNYL (SUBLIMAZE) injection 50 mcg (50 mcg Intravenous Given 01/06/17 2234)     Initial Impression / Assessment and Plan / ED Course  I have reviewed the triage vital signs and the nursing notes.  Pertinent labs & imaging results that were available during my care of the patient were reviewed by me and considered in my medical decision making (see chart for details).  10:41 PM Patient awake and alert, in no distress. He has been evaluated by our vascular team. Patient will have expedited outpatient follow-up.  Patient acknowledges importance of attending this follow-up appointment, denies other complaints.  With no evidence for substantial intraperitoneal bleeding, but with acknowledgment of likely small endoleak, versus other nonacute causes of flank pain, but with no evidence for nephrolithiasis, nor other acute findings, the patient was discharged with a short course of analgesia.  Final Clinical Impressions(s) / ED Diagnoses   Final diagnoses:  Right flank pain  Endoleak of aortic graft [facility])    New Prescriptions Current Discharge Medication List       Carmin Muskrat, MD 01/07/17 [phone]

## 2017-01-06 NOTE — Discharge Instructions (Signed)
As discussed, your evaluation today has been largely reassuring.  But, it is important that you monitor your condition carefully, and do not hesitate to return to the ED if you develop new, or concerning changes in your condition. ? ?Otherwise, please follow-up with your physician for appropriate ongoing care. ? ?

## 2017-01-08 ENCOUNTER — Telehealth: Payer: Self-pay | Admitting: Vascular Surgery

## 2017-01-08 NOTE — Telephone Encounter (Signed)
-----   Message from Denman George, RN sent at 01/07/2017  3:02 PM EDT ----- Regarding: needs appt. with Dr. Oneida Alar in 2-3 wks; cancel appts. in 02/2017  Type 2 Endoleak;  recent CTA Chest/Abd/Pelvis  ----- Message ----- From: Waynetta Sandy, MD Sent: 01/06/2017  10:24 PM To: Vvs Charge [address]  Christian Mccall [id number] Aug 19, 1955  ED level 3 consult Dx: type II endoleak  He needs to see Dr. Oneida Alar in 2 or 3 weeks and can have both his appointment and [place] cancelled for September.

## 2017-01-08 NOTE — Telephone Encounter (Signed)
Sched appt 01/30/17 at 1:45. Spoke to pt.

## 2017-01-09 DIAGNOSIS — D696 Thrombocytopenia, unspecified: Secondary | ICD-10-CM | POA: Diagnosis not present

## 2017-01-09 DIAGNOSIS — I714 Abdominal aortic aneurysm, without rupture: Secondary | ICD-10-CM | POA: Diagnosis not present

## 2017-01-09 DIAGNOSIS — I119 Hypertensive heart disease without heart failure: Secondary | ICD-10-CM | POA: Diagnosis not present

## 2017-01-09 DIAGNOSIS — Z6829 Body mass index (BMI) 29.0-29.9, adult: Secondary | ICD-10-CM | POA: Diagnosis not present

## 2017-01-09 DIAGNOSIS — D519 Vitamin B12 deficiency anemia, unspecified: Secondary | ICD-10-CM | POA: Diagnosis not present

## 2017-01-09 DIAGNOSIS — E1151 Type 2 diabetes mellitus with diabetic peripheral angiopathy without gangrene: Secondary | ICD-10-CM | POA: Diagnosis not present

## 2017-01-09 DIAGNOSIS — J449 Chronic obstructive pulmonary disease, unspecified: Secondary | ICD-10-CM | POA: Diagnosis not present

## 2017-01-09 DIAGNOSIS — M1990 Unspecified osteoarthritis, unspecified site: Secondary | ICD-10-CM | POA: Diagnosis not present

## 2017-01-21 ENCOUNTER — Encounter: Payer: Self-pay | Admitting: Vascular Surgery

## 2017-01-22 ENCOUNTER — Telehealth: Payer: Self-pay | Admitting: Oncology

## 2017-01-22 ENCOUNTER — Ambulatory Visit (HOSPITAL_BASED_OUTPATIENT_CLINIC_OR_DEPARTMENT_OTHER): Payer: Medicare HMO | Admitting: Oncology

## 2017-01-22 ENCOUNTER — Other Ambulatory Visit (HOSPITAL_BASED_OUTPATIENT_CLINIC_OR_DEPARTMENT_OTHER): Payer: Medicare HMO

## 2017-01-22 VITALS — BP 125/72 | HR 61 | Temp 98.3°F | Resp 18 | Ht 71.0 in | Wt 217.2 lb

## 2017-01-22 DIAGNOSIS — K7460 Unspecified cirrhosis of liver: Secondary | ICD-10-CM | POA: Diagnosis not present

## 2017-01-22 DIAGNOSIS — D696 Thrombocytopenia, unspecified: Secondary | ICD-10-CM

## 2017-01-22 LAB — CBC WITH DIFFERENTIAL/PLATELET
BASO%: 0.4 % (ref 0.0–2.0)
Basophils Absolute: 0 10*3/uL (ref 0.0–0.1)
EOS ABS: 0.1 10*3/uL (ref 0.0–0.5)
EOS%: 2.2 % (ref 0.0–7.0)
HCT: 42.6 % (ref 38.4–49.9)
HGB: 14.3 g/dL (ref 13.0–17.1)
LYMPH%: 35.5 % (ref 14.0–49.0)
MCH: 31.4 pg (ref 27.2–33.4)
MCHC: 33.6 g/dL (ref 32.0–36.0)
MCV: 93.4 fL (ref 79.3–98.0)
MONO#: 0.5 10*3/uL (ref 0.1–0.9)
MONO%: 9.5 % (ref 0.0–14.0)
NEUT#: 2.6 10*3/uL (ref 1.5–6.5)
NEUT%: 52.4 % (ref 39.0–75.0)
PLATELETS: 60 10*3/uL — AB (ref 140–400)
RBC: 4.56 10*6/uL (ref 4.20–5.82)
RDW: 13.3 % (ref 11.0–14.6)
WBC: 4.9 10*3/uL (ref 4.0–10.3)
lymph#: 1.8 10*3/uL (ref 0.9–3.3)

## 2017-01-22 NOTE — Progress Notes (Signed)
Hematology and Oncology Follow Up Visit  Christian Mccall [id number] 05-15-1956 61 y.o. 01/22/2017 10:08 AM   Principle Diagnosis: 61 year old gentleman with chronic fluctuating thrombocytopenia. The etiology related to chronic liver disease.   Current therapy: Observation and surveillance.  Interim History:  Christian Mccall presents today for a followup visit. Since the last visit, he started developing right-sided abdominal pain as well as right-sided flank pain. He was seen in the emergency department on 01/06/2017 and a CT scan of the chest was obtained. CT scan showed a recurrent and a week in the posterior aspect of the abdominal aortic aneurysm. He appears to be clinically stable and has follow-up with Dr. Oneida Alar in the near future. His last embolization procedure was done in November 2016 by interventional radiology. He tolerated the procedure without any postoperative bleeding. He did receive 1 unit of platelets in anticipation of that procedure.  Clinically, he reports no bleeding episodes at this time. He denied any hematochezia, melena or hemoptysis. He is energy performance status remained unchanged.  He denied any headaches, blurry vision, syncope or seizures. He does not report any chest pain, palpitation or orthopnea. He does not report any cough, wheezing or hemoptysis. Does not report any nausea, vomiting or abdominal pain. Does not report any constipation or diarrhea. He does not report any frequency urgency or hesitancy. Remaining review of systems unremarkable.  Medications: I have reviewed the patient's current medications.  Current Outpatient Prescriptions  Medication Sig Dispense Refill  . albuterol (PROVENTIL HFA;VENTOLIN HFA) 108 (90 BASE) MCG/ACT inhaler Inhale 2 puffs into the lungs daily as needed for wheezing or shortness of breath.    [name] aspirin EC 81 MG tablet Take 1 tablet (81 mg total) by mouth daily.    . cholecalciferol (VITAMIN D) 1000 UNITS tablet Take 1,000 Units by  mouth daily.     . clonazePAM (KLONOPIN) 1 MG tablet Take 1 mg by mouth at bedtime.    . clopidogrel (PLAVIX) 75 MG tablet Take 1 tablet (75 mg total) by mouth daily.    . cyanocobalamin (,VITAMIN B-12,) 1000 MCG/ML injection Inject 1,000 mcg into the muscle See admin instructions. Every 30 to 60 days    . empagliflozin (JARDIANCE) 10 MG TABS tablet Take 10 mg by mouth daily.    [name] escitalopram (LEXAPRO) 20 MG tablet Take 20 mg by mouth at bedtime.    [name] ezetimibe-simvastatin (VYTORIN) 10-40 MG per tablet Take 1 tablet by mouth at bedtime.     . fenofibrate 160 MG tablet Take 160 mg by mouth daily.     . Fluticasone-Salmeterol (ADVAIR) 100-50 MCG/DOSE AEPB Inhale 1 puff into the lungs 2 (two) times daily.     . furosemide (LASIX) 40 MG tablet Take 40 mg by mouth every other day.     [name] glimepiride (AMARYL) 4 MG tablet Take 4 mg by mouth every evening.     . lansoprazole (PREVACID) 30 MG capsule Take 30 mg by mouth daily.   3  . losartan (COZAAR) 50 MG tablet Take 50 mg by mouth daily.      . metFORMIN (GLUCOPHAGE) 1000 MG tablet Take 1,000 mg by mouth 2 (two) times daily. Breakfast and at bedtime    . metoprolol (LOPRESSOR) 50 MG tablet Take 50 mg by mouth 2 (two) times daily.     . nitroGLYCERIN (NITROSTAT) 0.4 MG SL tablet Place 0.4 mg under the tongue every 5 (five) minutes as needed for chest pain.    [name] oxyCODONE-acetaminophen (PERCOCET/ROXICET) 5-325 MG  tablet Take 1 tablet by mouth every 6 (six) hours as needed for moderate pain. 15 tablet 0  . pramipexole (MIRAPEX) 0.25 MG tablet Take 0.25 mg by mouth at bedtime.     . pregabalin (LYRICA) 100 MG capsule Take 100-200 mg by mouth See admin instructions. Take 1 tablet (100 mg) by mouth every morning and 2 tablets (200 mg) at bedtime    . ranolazine (RANEXA) 1000 MG SR tablet Take 1,000 mg by mouth 2 (two) times daily.      [name] terazosin (HYTRIN) 1 MG capsule Take 1 mg by mouth at bedtime.     [name] tiotropium (SPIRIVA) 18 MCG inhalation capsule Place  18 mcg into inhaler and inhale daily.      No current facility-administered medications for this visit.      Allergies: No Known Allergies  Past Medical History, Surgical history, Social history, and Family History were reviewed and updated.  Physical Exam: Blood pressure 125/72, pulse 61, temperature 98.3 F (36.8 C), temperature source Oral, resp. rate 18, height 5\' 11"  (1.803 m), weight 217 lb 3.2 oz (98.5 kg), SpO2 98 %. ECOG: 1 General appearance: Well-appearing gentleman without distress. Head: Normocephalic, without obvious abnormality. No oral ulcers or lesions. Neck: no adenopathy Lymph nodes: Cervical, supraclavicular, and axillary nodes normal. Heart:regular rate and rhythm, S1, S2 normal, no murmur, click, rub or gallop Lung: Clear to auscultation without wheezes or dullness to percussion.  Abdomin: soft, non-tender, without masses or organomegaly no shifting dullness or ascites. EXT:no erythema, induration, or nodules   Lab Results: Lab Results  Component Value Date   WBC 4.9 01/22/2017   HGB 14.3 01/22/2017   HCT 42.6 01/22/2017   MCV 93.4 01/22/2017   PLT 60 (L) 01/22/2017     Chemistry      Component Value Date/Time   NA 137 01/06/2017 1813   NA 137 04/24/2016 0936   K 3.9 01/06/2017 1813   K 4.5 04/24/2016 0936   CL 101 01/06/2017 1813   CO2 26 01/06/2017 1813   CO2 23 04/24/2016 0936   BUN 11 01/06/2017 1813   BUN 11.6 04/24/2016 0936   CREATININE 0.89 01/06/2017 1813   CREATININE 1.0 04/24/2016 0936      Component Value Date/Time   CALCIUM 9.5 01/06/2017 1813   CALCIUM 9.3 04/24/2016 0936   ALKPHOS 69 04/24/2016 0936   AST 28 04/24/2016 0936   ALT 31 04/24/2016 0936   BILITOT 0.69 04/24/2016 0936        Impression and Plan:  61 year old gentleman with the following issues:  1. Thrombocytopenia: the etiology is related to to liver disease and splenomegaly versus autoimmune thrombocytopenia. His platelet count today is 60without any  spontaneous bleeding.  I have recommended continued surveillance for now without intervention at this time unless a procedure is planned.  He responds well to platelet transfusion on the day of the procedure. I recommend 1 unit of platelet transfusion on the day of the procedure to raise his platelet count for precautionary standpoint. Platelet count of 60,000 should be adequate to sustain most procedures. Both his plan a vascular procedure that could be higher risk, then a platelet transfusion would be reasonable.  2. Cirrhosis of the liver: He continues to be managed by gastroenterology.  3. Follow-up: Will be in 6 months.   [facility], MD 8/15/201810:08 AM

## 2017-01-22 NOTE — Telephone Encounter (Signed)
Gave patient avs and calendar for appts.  °

## 2017-01-30 ENCOUNTER — Ambulatory Visit (INDEPENDENT_AMBULATORY_CARE_PROVIDER_SITE_OTHER): Payer: Medicare HMO | Admitting: Vascular Surgery

## 2017-01-30 ENCOUNTER — Encounter: Payer: Self-pay | Admitting: Vascular Surgery

## 2017-01-30 VITALS — BP 138/79 | HR 58 | Temp 97.7°F | Resp 20 | Ht 71.0 in | Wt 217.0 lb

## 2017-01-30 DIAGNOSIS — I714 Abdominal aortic aneurysm, without rupture, unspecified: Secondary | ICD-10-CM

## 2017-01-30 NOTE — Progress Notes (Signed)
Patient is a 61 year old male who returns for follow-up today. He underwent endovascular repair of an abdominal aortic aneurysm in 2012 with a Gore Excluder stent graft. He underwent direct sac puncture and injection of a type II endoleak by Dr. Laurence Ferrari in interventional radiology in September 2016. His aneurysm had been 5.5 cm after repair. It had enlarged to 6.2 cm at that point. The aneurysm subsequently with 5.6 cm in diameter September 2017. Recently the patient developed some right flank pain and was seen in the emergency room. It appeared at that time the type II endoleak had recurred and the aneurysm was now 6.3 cm in diameter.  He continues to have some intermittent right flank pain. He also has known liver cirrhosis. Associated with that he has chronic thrombocytopenia.  He denies any prior GI bleeding episodes.  Past Medical History:  Diagnosis Date  . AAA (abdominal aortic aneurysm) ([place])   . Anginal pain ([place])   . Bulging lumbar disc    "& some in my neck"  . CAD (coronary artery disease)   . Change in platelet count    pt states 'has been low since 2012; patient had to receive platelets in surgery in Nov 2016'  . Chronic back pain   . Cirrhosis ([place])   . COPD (chronic obstructive pulmonary disease) ([place])   . Diabetic peripheral neuropathy ([place])   . Emphysema lung ([place])   . Enlarged liver   . Enlarged prostate   . GERD (gastroesophageal reflux disease)   . Hardening of the arteries of the brain   . Headache    "~ weekly" (04/12/2015)  . Heart attack [facility]) Nov. 15,2016   TIA  . History of colon polyps   . Hyperlipidemia   . Hypertension   . Leg pain    with walking and pain in feet while lying flat  . Memory loss    Due to hardening of the arteries in the brain  . Myocardial infarction ([place])    "I've had probably 6-7" (04/12/2015)  . Restless leg syndrome   . Shortness of breath 12/06/10   while lying flat and with exertion  . Snoring   . Spleen enlarged   . Stroke  [facility]) 2004   denies residual on 04/12/2015  . Thrombocytopenia ([place])    since 2012; can be as low as 54K.   [name] TIA (transient ischemic attack) "several"   "since 2015" (04/12/2015)  . Type II diabetes mellitus ([place]) dx'd 2009    Past Surgical History:  Procedure Laterality Date  . ABDOMINAL AORTIC ANEURYSM REPAIR  01-16-11   EVAR  . ABDOMINAL AORTIC ANEURYSM REPAIR  04/12/2015  . CARDIAC CATHETERIZATION  "several"  . COLONOSCOPY    . COLONOSCOPY W/ BIOPSIES    . COLONOSCOPY WITH PROPOFOL N/A 03/21/2016   Procedure: COLONOSCOPY WITH PROPOFOL;  Surgeon: Arta Silence, MD;  Location: [facility] ENDOSCOPY;  Service: Endoscopy;  Laterality: N/A;  . CORONARY ANGIOPLASTY WITH STENT PLACEMENT  "several"   > 4 sents  . CORONARY ARTERY BYPASS GRAFT  02/18/2001   "CABG x 4"  . Direct sac puncture with liquid embolic repair of Type II endoleak  04/12/2015  . ESOPHAGOGASTRODUODENOSCOPY (EGD) WITH PROPOFOL N/A 03/21/2016   Procedure: ESOPHAGOGASTRODUODENOSCOPY (EGD) WITH PROPOFOL;  Surgeon: Arta Silence, MD;  Location: [facility] ENDOSCOPY;  Service: Endoscopy;  Laterality: N/A;  . PERIPHERAL VASCULAR CATHETERIZATION N/A 03/10/2015   Procedure: Abdominal Aortogram;  Surgeon: Elam Dutch, MD;  Location: [place] CV LAB;  Service: Cardiovascular;  Laterality: N/A;  . RADIOLOGY WITH ANESTHESIA N/A 04/12/2015   Procedure: embolization;  Surgeon: Jacqulynn Cadet, MD;  Location: [place];  Service: Radiology;  Laterality: N/A;  . SHOULDER SURGERY Right 07-27-2015  . TONSILLECTOMY  ~ 1965   Current Outpatient Prescriptions on File Prior to Visit  Medication Sig Dispense Refill  . albuterol (PROVENTIL HFA;VENTOLIN HFA) 108 (90 BASE) MCG/ACT inhaler Inhale 2 puffs into the lungs daily as needed for wheezing or shortness of breath.    [name] aspirin EC 81 MG tablet Take 1 tablet (81 mg total) by mouth daily.    . cholecalciferol (VITAMIN D) 1000 UNITS tablet Take 1,000 Units by mouth daily.     . clonazePAM (KLONOPIN) 1 MG  tablet Take 1 mg by mouth at bedtime.    . clopidogrel (PLAVIX) 75 MG tablet Take 1 tablet (75 mg total) by mouth daily.    . cyanocobalamin (,VITAMIN B-12,) 1000 MCG/ML injection Inject 1,000 mcg into the muscle See admin instructions. Every 30 to 60 days    . empagliflozin (JARDIANCE) 10 MG TABS tablet Take 10 mg by mouth daily.    [name] escitalopram (LEXAPRO) 20 MG tablet Take 20 mg by mouth at bedtime.    [name] ezetimibe-simvastatin (VYTORIN) 10-40 MG per tablet Take 1 tablet by mouth at bedtime.     . fenofibrate 160 MG tablet Take 160 mg by mouth daily.     . Fluticasone-Salmeterol (ADVAIR) 100-50 MCG/DOSE AEPB Inhale 1 puff into the lungs 2 (two) times daily.     . furosemide (LASIX) 40 MG tablet Take 40 mg by mouth every other day.     [name] glimepiride (AMARYL) 4 MG tablet Take 4 mg by mouth every evening.     . lansoprazole (PREVACID) 30 MG capsule Take 30 mg by mouth daily.   3  . losartan (COZAAR) 50 MG tablet Take 50 mg by mouth daily.      . metFORMIN (GLUCOPHAGE) 1000 MG tablet Take 1,000 mg by mouth 2 (two) times daily. Breakfast and at bedtime    . metoprolol (LOPRESSOR) 50 MG tablet Take 50 mg by mouth 2 (two) times daily.     . nitroGLYCERIN (NITROSTAT) 0.4 MG SL tablet Place 0.4 mg under the tongue every 5 (five) minutes as needed for chest pain.    [name] oxyCODONE-acetaminophen (PERCOCET/ROXICET) 5-325 MG tablet Take 1 tablet by mouth every 6 (six) hours as needed for moderate pain. 15 tablet 0  . pramipexole (MIRAPEX) 0.25 MG tablet Take 0.25 mg by mouth at bedtime.     . pregabalin (LYRICA) 100 MG capsule Take 100-200 mg by mouth See admin instructions. Take 1 tablet (100 mg) by mouth every morning and 2 tablets (200 mg) at bedtime    . ranolazine (RANEXA) 1000 MG SR tablet Take 1,000 mg by mouth 2 (two) times daily.      [name] terazosin (HYTRIN) 1 MG capsule Take 1 mg by mouth at bedtime.     [name] tiotropium (SPIRIVA) 18 MCG inhalation capsule Place 18 mcg into inhaler and inhale daily.       No current facility-administered medications on file prior to visit.     No Known Allergies  Physical exam:  Vitals:   01/30/17 1237  BP: 138/79  Pulse: (!) 58  Resp: 20  Temp: 97.7 F (36.5 C)  SpO2: 94%  Weight: 217 lb (98.4 kg)  Height: 5\' 11"  (1.803 m)    Abdomen: Soft nontender nondistended no pulsatile mass  Extremities: Absent pedal  pulses bilaterally 2+ femoral pulses  Data: I reviewed the patient's recent CT Angio the abdomen and pelvis. Images were reviewed which shows what appears to be contrast of a type II endoleak adjacent to his previous injection area aneurysm diameter 6.3 cm no evidence of aneurysm rupture  Assessment: Recurrent type II endoleak with aneurysm expansion to 6.3 cm in diameter. No evidence of rupture currently. Difficult to know if the right flank pain is related to his aneurysm or not.  Plan: I spoke with Dr. Earleen Newport from interventional radiology today. He will discuss with Dr. Geroge Baseman or one of his colleagues getting the patient and for a possible intervention within the next couple of weeks. Due to the size of the aneurysm if we are unable to resolve this endoleak with percutaneous techniques he may require graft explant. However, this would be very high risk in light of the patient's cirrhosis. Hopefully there will be an endovascular solution to this. I will await word from interventional radiology's findings. I explained to the patient today that if he has worsening of his flank or back pain he is to go to the emergency room and let them know that he has no abdominal aortic aneurysm.  Ruta Hinds, MD [facility] Office: [phone] Pager: [phone]

## 2017-02-03 ENCOUNTER — Other Ambulatory Visit (HOSPITAL_COMMUNITY): Payer: Self-pay | Admitting: Interventional Radiology

## 2017-02-03 ENCOUNTER — Other Ambulatory Visit: Payer: Self-pay | Admitting: Vascular Surgery

## 2017-02-03 DIAGNOSIS — T82330D Leakage of aortic (bifurcation) graft (replacement), subsequent encounter: Secondary | ICD-10-CM

## 2017-02-03 DIAGNOSIS — IMO0001 Reserved for inherently not codable concepts without codable children: Secondary | ICD-10-CM

## 2017-02-04 ENCOUNTER — Ambulatory Visit (HOSPITAL_COMMUNITY)
Admission: RE | Admit: 2017-02-04 | Discharge: 2017-02-04 | Disposition: A | Discharge status: Home / Self Care | Payer: Medicare HMO | Source: Ambulatory Visit | Attending: Interventional Radiology | Admitting: Interventional Radiology

## 2017-02-04 DIAGNOSIS — R161 Splenomegaly, not elsewhere classified: Secondary | ICD-10-CM | POA: Diagnosis not present

## 2017-02-04 DIAGNOSIS — N400 Enlarged prostate without lower urinary tract symptoms: Secondary | ICD-10-CM | POA: Diagnosis not present

## 2017-02-04 DIAGNOSIS — X58XXXD Exposure to other specified factors, subsequent encounter: Secondary | ICD-10-CM | POA: Diagnosis not present

## 2017-02-04 DIAGNOSIS — IMO0001 Reserved for inherently not codable concepts without codable children: Secondary | ICD-10-CM

## 2017-02-04 DIAGNOSIS — T82330D Leakage of aortic (bifurcation) graft (replacement), subsequent encounter: Secondary | ICD-10-CM | POA: Insufficient documentation

## 2017-02-04 DIAGNOSIS — K5730 Diverticulosis of large intestine without perforation or abscess without bleeding: Secondary | ICD-10-CM | POA: Insufficient documentation

## 2017-02-04 MED ORDER — IOPAMIDOL (ISOVUE-370) INJECTION 76%
100.0000 mL | Freq: Once | INTRAVENOUS | Status: AC | PRN
  Administered 2017-02-04: 100 mL via INTRAVENOUS

## 2017-02-04 MED ORDER — IOPAMIDOL (ISOVUE-370) INJECTION 76%
INTRAVENOUS | Status: AC
  Filled 2017-02-04: qty 100

## 2017-02-05 ENCOUNTER — Ambulatory Visit
Admission: RE | Admit: 2017-02-05 | Discharge: 2017-02-05 | Disposition: A | Discharge status: Home / Self Care | Payer: Medicare HMO | Source: Ambulatory Visit | Attending: Vascular Surgery | Admitting: Vascular Surgery

## 2017-02-05 ENCOUNTER — Other Ambulatory Visit: Payer: Self-pay | Admitting: Interventional Radiology

## 2017-02-05 DIAGNOSIS — T82330A Leakage of aortic (bifurcation) graft (replacement), initial encounter: Secondary | ICD-10-CM

## 2017-02-05 DIAGNOSIS — IMO0001 Reserved for inherently not codable concepts without codable children: Secondary | ICD-10-CM

## 2017-02-05 DIAGNOSIS — T82330D Leakage of aortic (bifurcation) graft (replacement), subsequent encounter: Secondary | ICD-10-CM

## 2017-02-05 DIAGNOSIS — IMO0002 Reserved for concepts with insufficient information to code with codable children: Secondary | ICD-10-CM

## 2017-02-05 HISTORY — PX: IR RADIOLOGIST EVAL & MGMT: IMG5224

## 2017-02-05 NOTE — Progress Notes (Signed)
Chief Complaint: Patient was seen in follow up today for endoleak at the request of Fields,Charles E  Referring Physician(s):  Fields,Charles E  History of Present Illness: Christian Mccall is a 61 y.o. male with a history of abdominal aortic aneurysm status post endovascular aortic repair in August 2012. He has a known type II endoleak which has been followed. His aneurysm demonstrates definitive enlargement from 5.5 cm to up to 6.2 cm between August 2014 and September 2016.  He underwent percutaneous repair of the endoleak via direct sac puncture on 04/12/2015. The procedure demonstrated arterial perfusion of the excluded aneurysm sac secondary to a right-sided lumbar artery. The endoleak was successfully repaired using Onyx liquid embolic.  He had done well with some evidence of slight decrease in the size of the aneurysm sac on follow-up imaging. However, over the past several months he has had intermittent episodes of right-sided lower back pain which occasionally radiates across to the left side of his back. The pain is quite severe and sharp and tends to resolve spontaneously although it may take several hours. The pain has been so severe that it causes him to cry out and he has sought attention in the emergency department. Repeat imaging demonstrated a small recurrent type II endoleak. There was some initial concern that the aneurysm sac had enlarged, however after reviewing the images this is less dramatic than initially thought. However, there is a slight change in configuration of the aneurysm sac which has occurred over the last 1.5 years.  Christian Mccall is currently very active. 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 He has been doing some fairly strenuous work with lifting car parts including transmissions.  Other than his intermittent and self-limited back pain, he denies abdominal pain, dizziness, chest pain, shortness of breath or syncope.  Past Medical History:    Diagnosis Date  . AAA (abdominal aortic aneurysm) ([place])   . Anginal pain ([place])   . Bulging lumbar disc    "& some in my neck"  . CAD (coronary artery disease)   . Change in platelet count    pt states 'has been low since 2012; patient had to receive platelets in surgery in Nov 2016'  . Chronic back pain   . Cirrhosis ([place])   . COPD (chronic obstructive pulmonary disease) ([place])   . Diabetic peripheral neuropathy ([place])   . Emphysema lung ([place])   . Enlarged liver   . Enlarged prostate   . GERD (gastroesophageal reflux disease)   . Hardening of the arteries of the brain   . Headache    "~ weekly" (04/12/2015)  . Heart attack [facility]) Nov. 15,2016   TIA  . History of colon polyps   . Hyperlipidemia   . Hypertension   . Leg pain    with walking and pain in feet while lying flat  . Memory loss    Due to hardening of the arteries in the brain  . Myocardial infarction ([place])    "I've had probably 6-7" (04/12/2015)  . Restless leg syndrome   . Shortness of breath 12/06/10   while lying flat and with exertion  . Snoring   . Spleen enlarged   . Stroke [facility]) 2004   denies residual on 04/12/2015  . Thrombocytopenia ([place])    since 2012; can be as low as 54K.   [name] TIA (transient ischemic attack) "several"   "since 2015" (04/12/2015)  . Type II diabetes mellitus ([place]) dx'd 2009  Past Surgical History:  Procedure Laterality Date  . ABDOMINAL AORTIC ANEURYSM REPAIR  01-16-11   EVAR  . ABDOMINAL AORTIC ANEURYSM REPAIR  04/12/2015  . CARDIAC CATHETERIZATION  "several"  . COLONOSCOPY    . COLONOSCOPY W/ BIOPSIES    . COLONOSCOPY WITH PROPOFOL N/A 03/21/2016   Procedure: COLONOSCOPY WITH PROPOFOL;  Surgeon: Arta Silence, MD;  Location: [facility] ENDOSCOPY;  Service: Endoscopy;  Laterality: N/A;  . CORONARY ANGIOPLASTY WITH STENT PLACEMENT  "several"   > 4 sents  . CORONARY ARTERY BYPASS GRAFT  02/18/2001   "CABG x 4"  . Direct sac puncture with liquid embolic repair of Type II endoleak  04/12/2015   . ESOPHAGOGASTRODUODENOSCOPY (EGD) WITH PROPOFOL N/A 03/21/2016   Procedure: ESOPHAGOGASTRODUODENOSCOPY (EGD) WITH PROPOFOL;  Surgeon: Arta Silence, MD;  Location: [facility] ENDOSCOPY;  Service: Endoscopy;  Laterality: N/A;  . PERIPHERAL VASCULAR CATHETERIZATION N/A 03/10/2015   Procedure: Abdominal Aortogram;  Surgeon: Elam Dutch, MD;  Location: [place] CV LAB;  Service: Cardiovascular;  Laterality: N/A;  . RADIOLOGY WITH ANESTHESIA N/A 04/12/2015   Procedure: embolization;  Surgeon: Jacqulynn Cadet, MD;  Location: [place];  Service: Radiology;  Laterality: N/A;  . SHOULDER SURGERY Right 07-27-2015  . TONSILLECTOMY  ~ 1965    Allergies: Patient has no known allergies.  Medications: Prior to Admission medications   Medication Sig Start Date End Date Taking? Authorizing Provider  albuterol (PROVENTIL HFA;VENTOLIN HFA) 108 (90 BASE) MCG/ACT inhaler Inhale 2 puffs into the lungs daily as needed for wheezing or shortness of breath.   Yes [provider]  aspirin EC 81 MG tablet Take 1 tablet (81 mg total) by mouth daily. 03/22/16  Yes Arta Silence, MD  cholecalciferol (VITAMIN D) 1000 UNITS tablet Take 1,000 Units by mouth daily.    Yes [provider]  clonazePAM (KLONOPIN) 1 MG tablet Take 1 mg by mouth.   Yes [provider]  clopidogrel (PLAVIX) 75 MG tablet Take 1 tablet (75 mg total) by mouth daily. 03/25/16  Yes Arta Silence, MD  cyanocobalamin (,VITAMIN B-12,) 1000 MCG/ML injection Inject 1,000 mcg into the muscle See admin instructions. Every 30 to 60 days   Yes [provider]  empagliflozin (JARDIANCE) 10 MG TABS tablet Take 10 mg by mouth daily.   Yes [provider]  escitalopram (LEXAPRO) 20 MG tablet Take 20 mg by mouth at bedtime.   Yes [provider]  ezetimibe-simvastatin (VYTORIN) 10-40 MG per tablet Take 1 tablet by mouth at bedtime.    Yes [provider]  fenofibrate 160 MG tablet Take 160 mg by mouth  daily.  11/03/10  Yes [provider]  Fluticasone-Salmeterol (ADVAIR) 100-50 MCG/DOSE AEPB Inhale 1 puff into the lungs 2 (two) times daily.    Yes [provider]  furosemide (LASIX) 40 MG tablet Take 40 mg by mouth every other day.    Yes [provider]  glimepiride (AMARYL) 4 MG tablet Take 4 mg by mouth every evening.    Yes [provider]  lansoprazole (PREVACID) 30 MG capsule Take 30 mg by mouth daily.  03/11/14  Yes [provider]  losartan (COZAAR) 50 MG tablet Take 50 mg by mouth daily.     Yes [provider]  metFORMIN (GLUCOPHAGE) 1000 MG tablet Take 1,000 mg by mouth 2 (two) times daily. Breakfast and at bedtime   Yes [provider]  metoprolol (LOPRESSOR) 50 MG tablet Take 50 mg by mouth 2 (two) times daily.  Yes [provider]  nitroGLYCERIN (NITROSTAT) 0.4 MG SL tablet Place 0.4 mg under the tongue every 5 (five) minutes as needed for chest pain.   Yes [provider]  oxyCODONE-acetaminophen (PERCOCET/ROXICET) 5-325 MG tablet Take 1 tablet by mouth every 6 (six) hours as needed for moderate pain. 01/06/17  Yes Carmin Muskrat, MD  pramipexole (MIRAPEX) 0.25 MG tablet Take 0.25 mg by mouth at bedtime.  03/24/12  Yes [provider]  pregabalin (LYRICA) 100 MG capsule Take 100-200 mg by mouth See admin instructions. Take 1 tablet (100 mg) by mouth every morning and 2 tablets (200 mg) at bedtime   Yes [provider]  ranolazine (RANEXA) 1000 MG SR tablet Take 1,000 mg by mouth 2 (two) times daily.     Yes [provider]  terazosin (HYTRIN) 1 MG capsule Take 1 mg by mouth at bedtime.    Yes [provider]  tiotropium (SPIRIVA) 18 MCG inhalation capsule Place 18 mcg into inhaler and inhale daily.    Yes [provider]     Family History  Problem Relation Age of Onset  . Aneurysm Mother        AAA  . Heart disease Mother        Heart Disease before  age 66  . Other Mother        Carotid artery stenosis  . Hyperlipidemia Mother   . Hypertension Mother   . Lupus Sister   . Cancer Sister        Ovarian  . Heart disease Sister   . Heart disease Brother        Heart Disease before age 55  . Hypertension Brother   . Heart attack Brother   . Heart disease Brother        Before age 72  . Heart attack Brother   . Heart attack Brother   . Hyperlipidemia Father   . Hypertension Father   . Aneurysm Maternal Aunt        AAA  . Other Maternal Aunt        AAA  . Aneurysm Maternal Uncle        AAA  . Other Maternal Uncle        AAA    Social History   Social History  . Marital status: Married    Spouse name: reba  . Number of children: 2  . Years of education: 9   Occupational History  . disability    Social History Main Topics  . Smoking status: Former Smoker    Packs/day: 0.00    Years: 30.00    Types: Cigarettes    Quit date: 02/17/2001  . Smokeless tobacco: Never Used  . Alcohol use Yes     Comment: occasional  . Drug use: No  . Sexual activity: Not Currently   Other Topics Concern  . Not on file   Social History Narrative   Patient lives at home with his wife.     Review of Systems: A 12 point ROS discussed and pertinent positives are indicated in the HPI above.  All other systems are negative.  Review of Systems  Vital Signs: BP 134/65 (BP Location: Left Arm, Patient Position: Sitting, Cuff Size: Normal)   Pulse 60   Temp 98 F (36.7 C)   Resp 16   SpO2 93%   Physical Exam  Constitutional: He is oriented to person, place, and time. He appears well-developed and well-nourished. No distress.  HENT:  Head: Normocephalic and atraumatic.  Eyes: No scleral icterus.  Cardiovascular: Normal rate and regular rhythm.   Pulmonary/Chest: Effort normal.  Abdominal: Soft. He exhibits no distension. There is no tenderness.  Neurological: He is alert and oriented to person, place, and time.  Skin: Skin is  warm and dry.  Psychiatric: He has a normal mood and affect. His behavior is normal.  Nursing note and vitals reviewed.   Imaging: Ct Angio Chest/abd/pel For Dissection W And/or Wo Contrast  Result Date: 01/06/2017 CLINICAL DATA:  Status post abdominal aortic repair for aneurysm. The patient now complains of similar pain in the right back. EXAM: CT ANGIOGRAPHY CHEST, ABDOMEN AND PELVIS TECHNIQUE: Multidetector CT imaging through the chest, abdomen and pelvis was performed using the standard protocol during bolus administration of intravenous contrast. Multiplanar reconstructed images and MIPs were obtained and reviewed to evaluate the vascular anatomy. CONTRAST:  100 cc Isovue 370 COMPARISON:  02/16/2016. FINDINGS: CTA CHEST FINDINGS Cardiovascular: Extensive dense coronary artery calcifications. Post CABG changes. Aortic calcification. Normal caliber thoracic aorta without dissection or aneurysm. The examination was not tailored for visualization of the pulmonary arteries. No pulmonary arterial filling defects are seen. Mediastinum/Nodes: No enlarged mediastinal, hilar, or axillary lymph nodes. Thyroid gland, trachea, and esophagus demonstrate no significant findings. Lungs/Pleura: Clear lungs.  No pleural fluid. Musculoskeletal: Thoracic spine degenerative changes. Review of the MIP images confirms the above findings. CTA ABDOMEN AND PELVIS FINDINGS VASCULAR Aorta: Aorto bi-iliac graft within a large, predominantly thrombosed native aorta. The aorta measures 6.3 cm in maximum diameter. There is an interval small amount of contrast opacification within the thrombosed portion of the aorta posteriorly. This is at and just below the level of dense embolization material. This is best seen on image number 135 of series 6. Celiac: Patent without stenosis. SMA: Patent without stenosis. Renals: 2 left renal arteries and 1 right renal artery, patent without stenosis. IMA: Arising from the thrombosed native aortic  aneurysm without opacification. Inflow: Atheromatous changes without significant luminal narrowing. Veins: Normally opacified renal veins and proximal inferior vena cava. Review of the MIP images confirms the above findings. NON-VASCULAR Hepatobiliary: Mild diffuse low density of the liver relative to the spleen. The liver margins are minimally lobulated with a prominent lateral segment left lobe and prominent caudate lobe. Distended, normal appearing gallbladder. Pancreas: Unremarkable. No pancreatic ductal dilatation or surrounding inflammatory changes. Spleen: Enlarged, measuring 17.7 cm in length. Adrenals/Urinary Tract: Adrenal glands are unremarkable. Kidneys are normal, without renal calculi, focal lesion, or hydronephrosis. Bladder is unremarkable. Stomach/Bowel: Multiple descending sigmoid colon diverticula without evidence of diverticulitis. Unremarkable small bowel and stomach. No evidence of appendicitis. Lymphatic: No enlarged lymph nodes. Reproductive: Mildly enlarged prostate gland with coarse calcification. Other: No abdominal wall hernia or abnormality. No abdominopelvic ascites. Musculoskeletal: Mild lumbar spine degenerative changes. Review of the MIP images confirms the above findings. IMPRESSION: 1. Recurrent endoleak in the posterior aspect of an otherwise thrombosed native abdominal aortic aneurysm with an aorto bi-iliac stent graft. The leaked contrast is at and just below the level of previously placed embolization material. 2. Again demonstrated are changes of cirrhosis of the liver with associated splenomegaly. 3. Colonic diverticulosis. 4. Extensive calcified coronary artery atherosclerosis as well as calcified aortic atherosclerosis. Electronically Signed   By: Claudie Revering M.D.   On: 01/06/2017 20:13   Ct Angio Abd/pel W/ And/or W/o  Result Date: 02/04/2017 CLINICAL DATA:  Endoleak post endovascular aneurysm, GABG, h/o cirrhosis, rt lat back pain x 51mos, CABG EXAM: CTA  ABDOMEN AND  PELVIS WITHOUT AND WITH CONTRAST TECHNIQUE: Multidetector CT imaging of the abdomen and pelvis was performed using the standard protocol during bolus administration of intravenous contrast. Multiplanar reconstructed images and MIPs were obtained and reviewed to evaluate the vascular anatomy. CONTRAST:  100 mL Isovue 370 IV COMPARISON:  01/06/2017 and previous FINDINGS: VASCULAR Aorta: Visualized distal descending thoracic aorta unremarkable. Minimal atheromatous plaque in the nondilated suprarenal segment. Patent infrarenal bifurcated stent graft. Embolization material in the posterior native aneurysm sac resulting in some streak artifact. Continued type 2 endoleak noted immediately inferior to the embolization material. Native aneurysm sac diameter 6.3 cm, stable since scans dating back to 02/16/2016 by my measurement. Celiac: Patent without evidence of aneurysm, dissection, vasculitis or significant stenosis. SMA: Patent without evidence of aneurysm, dissection, vasculitis or significant stenosis. Renals: Single right, with calcified ostial plaque resulting in mild stenosis. 3 left renal arteries, middle dominant, all patent. IMA: Origin occlusion, reconstituted distally by visceral collaterals. Inflow: Both limbs of the stent graft extend to the distal common iliac arteries, both well apposed. Native iliac arterial system remains patent without aneurysm or stenosis, with scattered atheromatous calcifications. Proximal Outflow: Bilateral common femoral and visualized portions of the superficial and profunda femoral arteries are patent without evidence of aneurysm, dissection, vasculitis or significant stenosis. Veins: Patent hepatic veins, portal vein, SMV, splenic vein, bilateral renal veins, IVC. Review of the MIP images confirms the above findings. NON-VASCULAR Hepatobiliary: Mildly nodular liver contour without focal lesion or biliary ductal dilatation. Gallbladder nondilated without calcified stones.  Pancreas: Unremarkable. No pancreatic ductal dilatation or surrounding inflammatory changes. Spleen: Enlarged up to 19.7 cm craniocaudal length without focal lesion. Adrenals/Urinary Tract: Adrenal glands are unremarkable. Kidneys are normal, without renal calculi, focal lesion, or hydronephrosis. Bladder is unremarkable. Stomach/Bowel: Stomach and small bowel are nondilated. Normal appendix. Scattered distal descending colon diverticula without significant adjacent inflammatory/edematous change. Colon is nondilated. No focal bowel wall thickening. Lymphatic: No abdominal or pelvic adenopathy. Reproductive: Prostatic enlargement with central coarse calcifications Other: No ascites.  No free air. Musculoskeletal: Linear embolization material in the posterior left pararenal retroperitoneal fat, stable. Anterior vertebral endplate spurring at multiple levels in the lower thoracic spine. Negative for fracture or worrisome bone lesion. IMPRESSION: VASCULAR 1. Patent infrarenal bifurcated stent graft with persistent type 2 endoleak, stable native sac diameter 6.3 cm. NON-VASCULAR 1. Nodular liver contour suggesting cirrhosis, with splenomegaly but no other stigmata of portal venous hypertension. 2. Distal descending colon diverticulosis. Electronically Signed   By: Lucrezia Europe M.D.   On: 02/04/2017 12:16    Labs:  CBC:  Recent Labs  02/16/16 1708 04/24/16 0936 01/06/17 1813 01/22/17 0940  WBC 9.3 5.5 4.8 4.9  HGB 16.2 14.1 14.9 14.3  HCT 48.0 42.0 43.8 42.6  PLT 118* 73* 62* 60*    COAGS:  Recent Labs  02/16/16 1708  INR 1.10    BMP:  Recent Labs  02/16/16 1708 04/24/16 0936 01/06/17 1813  NA 136 137 137  K 4.1 4.5 3.9  CL 102  --  101  CO2 23 23 26   GLUCOSE 158* 217* 89  BUN 13 11.6 11  CALCIUM 10.1 9.3 9.5  CREATININE 0.95 1.0 0.89  GFRNONAA >60  --  >60  GFRAA >60  --  >60    LIVER FUNCTION TESTS:  Recent Labs  04/24/16 0936  BILITOT 0.69  AST 28  ALT 31  ALKPHOS 69    PROT 7.1  ALBUMIN 3.6    TUMOR MARKERS: No  results for input(s): AFPTM, CEA, CA199, CHROMGRNA in the last 8760 hours.  Assessment and Plan:  There is convincing evidence of a small recurrent type II endoleak. I reviewed all of the imaging over the past 2 years with Christian Mccall and his wife in close detail. There is some confusion due to one of the studies where the aneurysm sac was measured in a different orientation resulting in a significantly decreased measurement which was not accurate. The aneurysm sac has not significantly changed in size over the past year. However, there has been a slight interval change in the configuration of the aneurysm sac with slowly increasing outpouching in the left posterolateral aspect of the sac, approximately at the 5:00 position. This is in close proximity to the small recurrent endoleak.  While I do not think his intermittent back pain is related to the aneurysm, I am still concerned that the endoleak is in a region of potential weakness of the sac wall. We discussed the options of continued observation versus proceeding with repeat repair and installation of additional liquid embolic. After discussing the risks, benefits and alternatives, we are in a mutual agreement that proceeding with repeat intervention is the best course of action.  1.) Schedule for direct sac puncture endoleak repair under general anesthesia at [facility].  2.) He will require platelets at the time of procedure.   Electronically Signed: Jacqulynn Cadet 02/05/2017, 5:32 PM   I spent a total of  25 Minutes in face to face in clinical consultation, greater than 50% of which was counseling/coordinating care for endoleak of AAA.

## 2017-02-06 ENCOUNTER — Telehealth: Payer: Self-pay | Admitting: Interventional Cardiology

## 2017-02-06 NOTE — Telephone Encounter (Signed)
Request for surgical clearance:  1. What type of surgery is being performed? Type 2 Endo leak repair   2. When is this surgery scheduled? Pending clearance   3. Are there any medications that need to be held prior to surgery and how long? Plavix and ASA for 5 days   4. Name of physician performing surgery? Dr. Laurence Ferrari   5. What is your office phone and fax number? Phone [phone] Fax [phone]   Fax results to Rogers, RN

## 2017-02-06 NOTE — Telephone Encounter (Signed)
Faxed to requesting office and called Henri to let her know clearance available in EPIC.

## 2017-02-06 NOTE — Telephone Encounter (Signed)
He is cleared for percutaneous endo leak repair. Okay to hold plavix and aspirin for 5 days prior.

## 2017-02-11 ENCOUNTER — Other Ambulatory Visit: Payer: Self-pay | Admitting: Vascular Surgery

## 2017-02-26 ENCOUNTER — Telehealth (HOSPITAL_COMMUNITY): Payer: Self-pay | Admitting: Radiology

## 2017-02-26 NOTE — Telephone Encounter (Signed)
Called pt's wife to update her on the current coordination of [name] procedure with Dr. Laurence Ferrari. Pt's wife will await a call from our office with the details ASAP. She was ok with this plan. JM

## 2017-03-04 ENCOUNTER — Other Ambulatory Visit: Payer: Self-pay | Admitting: Student

## 2017-03-04 ENCOUNTER — Other Ambulatory Visit (HOSPITAL_COMMUNITY): Payer: Self-pay

## 2017-03-04 ENCOUNTER — Other Ambulatory Visit: Payer: Self-pay | Admitting: Radiology

## 2017-03-04 ENCOUNTER — Encounter (HOSPITAL_COMMUNITY): Payer: Self-pay

## 2017-03-04 NOTE — Progress Notes (Signed)
   03/04/17 1256  OBSTRUCTIVE SLEEP APNEA  Have you ever been diagnosed with sleep apnea through a sleep study? No  Do you snore loudly (loud enough to be heard through closed doors)?  1  Do you often feel tired, fatigued, or sleepy during the daytime (such as falling asleep during driving or talking to someone)? 0  Has anyone observed you stop breathing during your sleep? 0  Do you have, or are you being treated for high blood pressure? 1  BMI more than 35 kg/m2? 0  Age > 50 (1-yes) 1  Neck circumference greater than:Male 16 inches or larger, Male 17inches or larger? 1  Male Gender (Yes=1) 1  Obstructive Sleep Apnea Score 5  Score 5 or greater  Results sent to PCP

## 2017-03-04 NOTE — Progress Notes (Addendum)
PCP - Dr. Shon Baton  Cardiologist - Dr. Daneen Schick  Chest x-ray - Denies  EKG - 03/05/17  Stress Test - 05/03/13 (E)  ECHO - unknown  Cardiac Cath - Denies  Sleep Study - Yes-Positive Stop Santa Lighter- Sent to PCP CPAP - None  Fasting Blood Sugar -  Checks Blood Sugar ___2__ times a day Pt told to hold Amaryl tonight and tomorrow, and to hold Metformin and Jardiance tomorrow. Chart given to anesthesia for review due to cardiac history. Per the wife, Leonia Reeves, the pt stopped taking Plavix on 9/20, as directed, but is to continue taking Aspirin 81mg .  Pt denies having chest pain, sob, or fever at this time during pre-op phone call. All instructions explained to the pt, with a verbal understanding. Pt to arrive to admitting at 0530 on tomorrow.The opportunity to ask questions was provided.

## 2017-03-04 NOTE — Progress Notes (Signed)
Anesthesia Chart Review:  Pt is a same day work up.   Pt is a 61 year old male scheduled for type II endoleak repair on 03/05/2017 with [facility] Laurence Ferrari, MD  - PCP is Shon Baton, MD.  - Cardiologist is Daneen Schick, MD who has cleared pt for surgery.   - Hematologist is Zola Button, MD. At last office visit 01/22/17, in reference to upcoming surgery Dr. Alen Blew notes: "He responds well to platelet transfusion on the day of the procedure. I recommend 1 unit of platelet transfusion on the day of the procedure to raise his platelet count for precautionary standpoint. Platelet count of 60,000 should be adequate to sustain most procedures. Both his plan a vascular procedure that could be higher risk, then a platelet transfusion would be reasonable."  PMH includes:  CAD (MI 2002, s/p CABG 2002), angina, HTN, DM, hyperlipidemia, stroke (2014, 2015), TIA, AAA (s/p EVAR 2012; s/p repair type II endoleak of AAA 04/12/15), COPD, emphysema, memory loss, GERD, cirrhosis, thrombocytopenia (related to liver disease). Former smoker. BMI 30. S/p R shoulder arthroscopy 07/27/15.   Medications include: albuterol, ASA, plavix, empagliflozin, vytorin, fenofibrate, advair, lasix, glimepiride, prevacid, losartan, metformin, metoprolol, mirapex, lyrica, ranolazine, terazosin, spiriva. Pt to stop ASA and plavix 5 days before surgery.   Labs will be obtained DOS.   - Platelets 60 on 01/22/17.   EKG will be obtained DOS.    CT angio chest, abd, pelvis 01/06/17:  Chest:  1. Recurrent endoleak in the posterior aspect of an otherwise thrombosed native abdominal aortic aneurysm with an aorto bi-iliac stent graft. The leaked contrast is at and just below the level of previously placed embolization material. 2. Again demonstrated are changes of cirrhosis of the liver with associated splenomegaly. 3. Colonic diverticulosis. 4. Extensive calcified coronary artery atherosclerosis as well as calcified aortic  atherosclerosis. Abd/pelvis:  1. Patent infrarenal bifurcated stent graft with persistent type 2 endoleak, stable native sac diameter 6.3 cm. 2. Nodular liver contour suggesting cirrhosis, with splenomegaly but no other stigmata of portal venous hypertension. 3. Distal descending colon diverticulosis.  Carotid duplex US 02/10/2014: R CCA disease present of less than 50% mid segment. B ICA stenosis in the <40% range.   Nuclear stress test 05/03/2013:  1. Mild matched attenuation involving the inferior wall without associated wall motion abnormality. No definitive scintigraphic evidence of prior infarction or pharmacologically induced ischemia. 2. Normal wall motion. Ejection fraction - 59%.  Cardiac cath 08/11/2008:  - Coronary angiography.  a. Left main coronary: Widely patent.  b. Left anterior descending coronary: Totally occluded in the  midvessel.  c. Circumflex artery: 99% OM #1 but this vessel is grafted.  It supplies collaterals to the distal right coronary.  d. Right coronary: Totally occluded. - Bypass graft angiography.  a. Saphenous vein graft to the diagonal #1: Widely patent.  b. Saphenous vein graft to the OM #1: Diffuse disease with up  to 60% proximal stenosis but patent.  c. Saphenous vein graft to RCA: Totally occluded. - LIMA to LAD: Widely patent  If labs and EKG acceptable DOS, I anticipate pt can proceed with surgery as scheduled.   Willeen Cass, FNP-BC [facility]/Anesthesiology Phone: [phone] 03/04/2017 2:42 PM

## 2017-03-05 ENCOUNTER — Ambulatory Visit (HOSPITAL_COMMUNITY)
Admission: RE | Admit: 2017-03-05 | Discharge: 2017-03-05 | Disposition: A | Discharge status: Home / Self Care | Payer: Medicare HMO | Source: Ambulatory Visit | Attending: Interventional Radiology | Admitting: Interventional Radiology

## 2017-03-05 ENCOUNTER — Observation Stay (HOSPITAL_COMMUNITY)
Admission: RE | Admit: 2017-03-05 | Discharge: 2017-03-06 | Disposition: A | Discharge status: Home / Self Care | Payer: Medicare HMO | Source: Ambulatory Visit | Attending: Interventional Radiology | Admitting: Interventional Radiology

## 2017-03-05 ENCOUNTER — Encounter (HOSPITAL_COMMUNITY): Payer: Self-pay | Admitting: *Deleted

## 2017-03-05 ENCOUNTER — Ambulatory Visit (HOSPITAL_COMMUNITY): Payer: Medicare HMO | Admitting: Emergency Medicine

## 2017-03-05 ENCOUNTER — Encounter (HOSPITAL_COMMUNITY)
Admission: RE | Disposition: A | Discharge status: Home / Self Care | Payer: Self-pay | Source: Ambulatory Visit | Attending: Interventional Radiology

## 2017-03-05 DIAGNOSIS — T82330A Leakage of aortic (bifurcation) graft (replacement), initial encounter: Secondary | ICD-10-CM | POA: Diagnosis not present

## 2017-03-05 DIAGNOSIS — K5730 Diverticulosis of large intestine without perforation or abscess without bleeding: Secondary | ICD-10-CM | POA: Diagnosis not present

## 2017-03-05 DIAGNOSIS — T82338A Leakage of other vascular grafts, initial encounter: Secondary | ICD-10-CM | POA: Diagnosis not present

## 2017-03-05 DIAGNOSIS — I2510 Atherosclerotic heart disease of native coronary artery without angina pectoris: Secondary | ICD-10-CM | POA: Diagnosis not present

## 2017-03-05 DIAGNOSIS — Y712 Prosthetic and other implants, materials and accessory cardiovascular devices associated with adverse incidents: Secondary | ICD-10-CM | POA: Diagnosis not present

## 2017-03-05 DIAGNOSIS — J449 Chronic obstructive pulmonary disease, unspecified: Secondary | ICD-10-CM | POA: Diagnosis not present

## 2017-03-05 DIAGNOSIS — M549 Dorsalgia, unspecified: Secondary | ICD-10-CM | POA: Insufficient documentation

## 2017-03-05 DIAGNOSIS — K7460 Unspecified cirrhosis of liver: Secondary | ICD-10-CM | POA: Diagnosis not present

## 2017-03-05 DIAGNOSIS — Z7902 Long term (current) use of antithrombotics/antiplatelets: Secondary | ICD-10-CM | POA: Insufficient documentation

## 2017-03-05 DIAGNOSIS — K219 Gastro-esophageal reflux disease without esophagitis: Secondary | ICD-10-CM | POA: Insufficient documentation

## 2017-03-05 DIAGNOSIS — IMO0002 Reserved for concepts with insufficient information to code with codable children: Secondary | ICD-10-CM

## 2017-03-05 DIAGNOSIS — I9789 Other postprocedural complications and disorders of the circulatory system, not elsewhere classified: Secondary | ICD-10-CM | POA: Diagnosis present

## 2017-03-05 DIAGNOSIS — I252 Old myocardial infarction: Secondary | ICD-10-CM | POA: Insufficient documentation

## 2017-03-05 DIAGNOSIS — N400 Enlarged prostate without lower urinary tract symptoms: Secondary | ICD-10-CM | POA: Diagnosis not present

## 2017-03-05 DIAGNOSIS — Z951 Presence of aortocoronary bypass graft: Secondary | ICD-10-CM | POA: Insufficient documentation

## 2017-03-05 DIAGNOSIS — G2581 Restless legs syndrome: Secondary | ICD-10-CM | POA: Insufficient documentation

## 2017-03-05 DIAGNOSIS — Z8673 Personal history of transient ischemic attack (TIA), and cerebral infarction without residual deficits: Secondary | ICD-10-CM | POA: Diagnosis not present

## 2017-03-05 DIAGNOSIS — Z8679 Personal history of other diseases of the circulatory system: Secondary | ICD-10-CM | POA: Insufficient documentation

## 2017-03-05 DIAGNOSIS — G8929 Other chronic pain: Secondary | ICD-10-CM | POA: Diagnosis not present

## 2017-03-05 DIAGNOSIS — E785 Hyperlipidemia, unspecified: Secondary | ICD-10-CM | POA: Diagnosis not present

## 2017-03-05 DIAGNOSIS — E1142 Type 2 diabetes mellitus with diabetic polyneuropathy: Secondary | ICD-10-CM | POA: Diagnosis not present

## 2017-03-05 DIAGNOSIS — Z955 Presence of coronary angioplasty implant and graft: Secondary | ICD-10-CM | POA: Diagnosis not present

## 2017-03-05 DIAGNOSIS — T82898A Other specified complication of vascular prosthetic devices, implants and grafts, initial encounter: Secondary | ICD-10-CM | POA: Diagnosis not present

## 2017-03-05 DIAGNOSIS — M542 Cervicalgia: Secondary | ICD-10-CM | POA: Diagnosis not present

## 2017-03-05 DIAGNOSIS — Z87891 Personal history of nicotine dependence: Secondary | ICD-10-CM | POA: Insufficient documentation

## 2017-03-05 DIAGNOSIS — I10 Essential (primary) hypertension: Secondary | ICD-10-CM | POA: Insufficient documentation

## 2017-03-05 DIAGNOSIS — Z7984 Long term (current) use of oral hypoglycemic drugs: Secondary | ICD-10-CM | POA: Insufficient documentation

## 2017-03-05 DIAGNOSIS — Z7982 Long term (current) use of aspirin: Secondary | ICD-10-CM | POA: Diagnosis not present

## 2017-03-05 DIAGNOSIS — Y713 Surgical instruments, materials and cardiovascular devices (including sutures) associated with adverse incidents: Secondary | ICD-10-CM | POA: Insufficient documentation

## 2017-03-05 HISTORY — PX: RADIOLOGY WITH ANESTHESIA: SHX6223

## 2017-03-05 HISTORY — PX: IR AORTAGRAM ABDOMINAL SERIALOGRAM: IMG636

## 2017-03-05 HISTORY — PX: IR EMBO ARTERIAL NOT HEMORR HEMANG INC GUIDE ROADMAPPING: IMG5448

## 2017-03-05 HISTORY — PX: IR CT SPINE LTD: IMG2387

## 2017-03-05 LAB — CBC
HEMATOCRIT: 42.4 % (ref 39.0–52.0)
HEMOGLOBIN: 14.4 g/dL (ref 13.0–17.0)
MCH: 31.1 pg (ref 26.0–34.0)
MCHC: 34 g/dL (ref 30.0–36.0)
MCV: 91.6 fL (ref 78.0–100.0)
Platelets: 68 10*3/uL — ABNORMAL LOW (ref 150–400)
RBC: 4.63 MIL/uL (ref 4.22–5.81)
RDW: 13.6 % (ref 11.5–15.5)
WBC: 4.9 10*3/uL (ref 4.0–10.5)

## 2017-03-05 LAB — BASIC METABOLIC PANEL WITH GFR
Anion gap: 10 (ref 5–15)
BUN: 17 mg/dL (ref 6–20)
CO2: 26 mmol/L (ref 22–32)
Calcium: 9.3 mg/dL (ref 8.9–10.3)
Chloride: 101 mmol/L (ref 101–111)
Creatinine, Ser: 0.99 mg/dL (ref 0.61–1.24)
GFR calc Af Amer: >60 mL/min
GFR calc non Af Amer: >60 mL/min
Glucose, Bld: 210 mg/dL — ABNORMAL HIGH (ref 65–99)
Potassium: 4.4 mmol/L (ref 3.5–5.1)
Sodium: 137 mmol/L (ref 135–145)

## 2017-03-05 LAB — GLUCOSE, CAPILLARY
Glucose-Capillary: 209 mg/dL — ABNORMAL HIGH (ref 65–99)
Glucose-Capillary: 168 mg/dL — ABNORMAL HIGH (ref 65–99)
Glucose-Capillary: 251 mg/dL — ABNORMAL HIGH (ref 65–99)

## 2017-03-05 LAB — PROTIME-INR
INR: 1.11
PROTHROMBIN TIME: 14.2 s (ref 11.4–15.2)

## 2017-03-05 LAB — HEMOGLOBIN A1C
Hgb A1c MFr Bld: 7 % — ABNORMAL HIGH (ref 4.8–5.6)
Mean Plasma Glucose: 154.2 mg/dL

## 2017-03-05 LAB — TYPE AND SCREEN
ABO/RH(D): A NEG
Antibody Screen: NEGATIVE

## 2017-03-05 LAB — APTT: aPTT: 32 s (ref 24–36)

## 2017-03-05 SURGERY — RADIOLOGY WITH ANESTHESIA
Anesthesia: General

## 2017-03-05 MED ORDER — ONDANSETRON HCL 4 MG/2ML IJ SOLN: 4.0000 mg | Freq: Once | INTRAMUSCULAR | Status: DC | PRN

## 2017-03-05 MED ORDER — SODIUM CHLORIDE 0.9 % IV SOLN: Freq: Once | INTRAVENOUS | Status: DC

## 2017-03-05 MED ORDER — FENOFIBRATE 160 MG PO TABS
160.0000 mg | ORAL_TABLET | Freq: Every day | ORAL | Status: DC
  Administered 2017-03-06: 160 mg via ORAL
  Filled 2017-03-05: qty 1

## 2017-03-05 MED ORDER — PREGABALIN 100 MG PO CAPS
200.0000 mg | ORAL_CAPSULE | Freq: Every day | ORAL | Status: DC
  Administered 2017-03-05: 200 mg via ORAL
  Filled 2017-03-05: qty 2

## 2017-03-05 MED ORDER — SUGAMMADEX SODIUM 500 MG/5ML IV SOLN
INTRAVENOUS | Status: DC | PRN
  Administered 2017-03-05: 200 mg via INTRAVENOUS

## 2017-03-05 MED ORDER — RANOLAZINE ER 500 MG PO TB12
1000.0000 mg | ORAL_TABLET | Freq: Two times a day (BID) | ORAL | Status: DC
  Administered 2017-03-05 – 2017-03-06 (×2): 1000 mg via ORAL
  Filled 2017-03-05 (×2): qty 2

## 2017-03-05 MED ORDER — EZETIMIBE-SIMVASTATIN 10-40 MG PO TABS
1.0000 | ORAL_TABLET | Freq: Every day | ORAL | Status: DC
  Administered 2017-03-05: 1 via ORAL
  Filled 2017-03-05: qty 1

## 2017-03-05 MED ORDER — PREGABALIN 100 MG PO CAPS
100.0000 mg | ORAL_CAPSULE | Freq: Every day | ORAL | Status: DC
  Administered 2017-03-06: 100 mg via ORAL
  Filled 2017-03-05: qty 1

## 2017-03-05 MED ORDER — CLONAZEPAM 1 MG PO TABS: 1.0000 mg | ORAL_TABLET | Freq: Two times a day (BID) | ORAL | Status: DC | PRN

## 2017-03-05 MED ORDER — TIOTROPIUM BROMIDE MONOHYDRATE 18 MCG IN CAPS
18.0000 ug | ORAL_CAPSULE | Freq: Every day | RESPIRATORY_TRACT | Status: DC
  Administered 2017-03-06: 18 ug via RESPIRATORY_TRACT
  Filled 2017-03-05: qty 5

## 2017-03-05 MED ORDER — ROCURONIUM BROMIDE 100 MG/10ML IV SOLN
INTRAVENOUS | Status: DC | PRN
  Administered 2017-03-05 (×2): 50 mg via INTRAVENOUS

## 2017-03-05 MED ORDER — FUROSEMIDE 40 MG PO TABS: 40.0000 mg | ORAL_TABLET | ORAL | Status: DC

## 2017-03-05 MED ORDER — ALBUTEROL SULFATE (2.5 MG/3ML) 0.083% IN NEBU: 2.5000 mg | INHALATION_SOLUTION | Freq: Every day | RESPIRATORY_TRACT | Status: DC | PRN

## 2017-03-05 MED ORDER — OXYCODONE HCL 5 MG PO TABS: 5.0000 mg | ORAL_TABLET | Freq: Once | ORAL | Status: DC | PRN

## 2017-03-05 MED ORDER — DOCUSATE SODIUM 100 MG PO CAPS
100.0000 mg | ORAL_CAPSULE | Freq: Two times a day (BID) | ORAL | Status: DC
  Administered 2017-03-05 – 2017-03-06 (×2): 100 mg via ORAL
  Filled 2017-03-05 (×2): qty 1

## 2017-03-05 MED ORDER — FLUTICASONE FUROATE-VILANTEROL 100-25 MCG/INH IN AEPB
1.0000 | INHALATION_SPRAY | Freq: Every day | RESPIRATORY_TRACT | Status: DC
  Administered 2017-03-06: 09:00:00 1 via RESPIRATORY_TRACT
  Filled 2017-03-05 (×2): qty 28

## 2017-03-05 MED ORDER — TERAZOSIN HCL 1 MG PO CAPS
1.0000 mg | ORAL_CAPSULE | Freq: Every day | ORAL | Status: DC
  Administered 2017-03-05: 1 mg via ORAL
  Filled 2017-03-05: qty 1

## 2017-03-05 MED ORDER — PANTOPRAZOLE SODIUM 40 MG PO TBEC
40.0000 mg | DELAYED_RELEASE_TABLET | Freq: Every day | ORAL | Status: DC
  Administered 2017-03-06: 40 mg via ORAL
  Filled 2017-03-05: qty 1

## 2017-03-05 MED ORDER — PROPOFOL 10 MG/ML IV BOLUS
INTRAVENOUS | Status: DC | PRN
  Administered 2017-03-05: 150 mg via INTRAVENOUS

## 2017-03-05 MED ORDER — ONDANSETRON HCL 4 MG/2ML IJ SOLN: 4.0000 mg | Freq: Four times a day (QID) | INTRAMUSCULAR | Status: DC | PRN

## 2017-03-05 MED ORDER — LIDOCAINE HCL (CARDIAC) 20 MG/ML IV SOLN
INTRAVENOUS | Status: DC | PRN
  Administered 2017-03-05: 40 mg via INTRAVENOUS

## 2017-03-05 MED ORDER — ASPIRIN EC 81 MG PO TBEC
81.0000 mg | DELAYED_RELEASE_TABLET | Freq: Every day | ORAL | Status: DC
  Administered 2017-03-06: 81 mg via ORAL
  Filled 2017-03-05: qty 1

## 2017-03-05 MED ORDER — ESCITALOPRAM OXALATE 20 MG PO TABS
20.0000 mg | ORAL_TABLET | Freq: Every day | ORAL | Status: DC
  Administered 2017-03-05: 20 mg via ORAL
  Filled 2017-03-05: qty 1

## 2017-03-05 MED ORDER — OXYCODONE HCL 5 MG/5ML PO SOLN: 5.0000 mg | Freq: Once | ORAL | Status: DC | PRN

## 2017-03-05 MED ORDER — GLIMEPIRIDE 4 MG PO TABS: 4.0000 mg | ORAL_TABLET | Freq: Every evening | ORAL | Status: DC

## 2017-03-05 MED ORDER — MIDAZOLAM HCL 5 MG/5ML IJ SOLN
INTRAMUSCULAR | Status: DC | PRN
  Administered 2017-03-05: 2 mg via INTRAVENOUS

## 2017-03-05 MED ORDER — IOPAMIDOL (ISOVUE-300) INJECTION 61%
INTRAVENOUS | Status: AC
  Administered 2017-03-05: 45 mL
  Filled 2017-03-05: qty 150

## 2017-03-05 MED ORDER — FENTANYL CITRATE (PF) 100 MCG/2ML IJ SOLN
INTRAMUSCULAR | Status: AC
  Filled 2017-03-05: qty 2

## 2017-03-05 MED ORDER — FENTANYL CITRATE (PF) 100 MCG/2ML IJ SOLN
INTRAMUSCULAR | Status: DC | PRN
  Administered 2017-03-05 (×2): 50 ug via INTRAVENOUS

## 2017-03-05 MED ORDER — PREGABALIN 100 MG PO CAPS: 100.0000 mg | ORAL_CAPSULE | ORAL | Status: DC

## 2017-03-05 MED ORDER — LACTATED RINGERS IV SOLN
INTRAVENOUS | Status: DC
  Administered 2017-03-05: 07:00:00 via INTRAVENOUS

## 2017-03-05 MED ORDER — NITROGLYCERIN 0.4 MG SL SUBL: 0.4000 mg | SUBLINGUAL_TABLET | SUBLINGUAL | Status: DC | PRN

## 2017-03-05 MED ORDER — HYDROCODONE-ACETAMINOPHEN 5-325 MG PO TABS
1.0000 | ORAL_TABLET | ORAL | Status: DC | PRN
  Administered 2017-03-05: 2 via ORAL
  Filled 2017-03-05: qty 2

## 2017-03-05 MED ORDER — PHENYLEPHRINE HCL 10 MG/ML IJ SOLN
INTRAMUSCULAR | Status: DC | PRN
  Administered 2017-03-05: 50 ug/min via INTRAVENOUS

## 2017-03-05 MED ORDER — FENTANYL CITRATE (PF) 100 MCG/2ML IJ SOLN
25.0000 ug | INTRAMUSCULAR | Status: DC | PRN
  Administered 2017-03-05: 50 ug via INTRAVENOUS

## 2017-03-05 MED ORDER — ONDANSETRON HCL 4 MG/2ML IJ SOLN
INTRAMUSCULAR | Status: DC | PRN
  Administered 2017-03-05: 4 mg via INTRAVENOUS

## 2017-03-05 MED ORDER — CANAGLIFLOZIN 100 MG PO TABS
100.0000 mg | ORAL_TABLET | Freq: Every day | ORAL | Status: DC
  Filled 2017-03-05: qty 1

## 2017-03-05 MED ORDER — PRAMIPEXOLE DIHYDROCHLORIDE 0.25 MG PO TABS
0.2500 mg | ORAL_TABLET | Freq: Every day | ORAL | Status: DC
  Administered 2017-03-05: 0.25 mg via ORAL
  Filled 2017-03-05: qty 1

## 2017-03-05 MED ORDER — METOPROLOL TARTRATE 50 MG PO TABS
50.0000 mg | ORAL_TABLET | Freq: Two times a day (BID) | ORAL | Status: DC
  Administered 2017-03-05 – 2017-03-06 (×2): 50 mg via ORAL
  Filled 2017-03-05 (×2): qty 1

## 2017-03-05 MED ORDER — LOSARTAN POTASSIUM 50 MG PO TABS
50.0000 mg | ORAL_TABLET | Freq: Every day | ORAL | Status: DC
  Administered 2017-03-06: 50 mg via ORAL
  Filled 2017-03-05: qty 1

## 2017-03-05 MED ORDER — GELATIN ABSORBABLE 12-7 MM EX MISC
CUTANEOUS | Status: AC
  Filled 2017-03-05: qty 1

## 2017-03-05 NOTE — Transfer of Care (Signed)
Immediate Anesthesia Transfer of Care Note  Patient: Christian Mccall  Procedure(s) Performed: Procedure(s): Type II Endoleak Repair (N/A)  Patient Location: PACU  Anesthesia Type:General  Level of Consciousness: awake and patient cooperative  Airway & Oxygen Therapy: Patient Spontanous Breathing and Patient connected to face mask oxygen  Post-op Assessment: Report given to RN and Post -op Vital signs reviewed and stable  Post vital signs: Reviewed and stable  Last Vitals:  Vitals:   03/05/17 0608 03/05/17 1050  BP: 130/60   Pulse: (!) 53 85  Resp: 20 12  Temp: (!) 36.3 C 36.6 C  SpO2: 97% 93%    Last Pain:  Vitals:   03/05/17 1050  TempSrc:   PainSc: 0-No pain      Patients Stated Pain Goal: 6 (28/83/37 4451)  Complications: No apparent anesthesia complications

## 2017-03-05 NOTE — H&P (Deleted)
  The note originally documented on this encounter has been moved the the encounter in which it belongs.  

## 2017-03-05 NOTE — Anesthesia Procedure Notes (Signed)
Arterial Line Insertion Start/End9/26/2018 7:25 AM, 03/05/2017 7:35 AM Performed by: Teressa Lower, CRNA  Patient location: Pre-op. Preanesthetic checklist: patient identified, IV checked, site marked, risks and benefits discussed, surgical consent, monitors and equipment checked, pre-op evaluation, timeout performed and anesthesia consent Lidocaine 1% used for infiltration Left, radial was placed Catheter size: 20 G Hand hygiene performed  and maximum sterile barriers used   Attempts: 1 Procedure performed without using ultrasound guided technique. Following insertion, dressing applied and Biopatch. Post procedure assessment: normal  Patient tolerated the procedure well with no immediate complications.

## 2017-03-05 NOTE — Anesthesia Procedure Notes (Signed)
Procedure Name: Intubation Date/Time: 03/05/2017 7:26 AM Performed by: Lance Coon Pre-anesthesia Checklist: Patient identified, Emergency Drugs available, Suction available, Patient being monitored and Timeout performed Patient Re-evaluated:Patient Re-evaluated prior to induction Oxygen Delivery Method: Circle system utilized Preoxygenation: Pre-oxygenation with 100% oxygen Induction Type: IV induction Ventilation: Mask ventilation without difficulty Laryngoscope Size: Miller and 3 Grade View: Grade I Tube type: Oral Tube size: 7.5 mm Number of attempts: 1 Airway Equipment and Method: Stylet Placement Confirmation: ETT inserted through vocal cords under direct vision,  positive ETCO2 and breath sounds checked- equal and bilateral Secured at: 22 cm Tube secured with: Tape Dental Injury: Teeth and Oropharynx as per pre-operative assessment

## 2017-03-05 NOTE — Anesthesia Postprocedure Evaluation (Signed)
Anesthesia Post Note  Patient: Christian Mccall  Procedure(s) Performed: Procedure(s) (LRB): Type II Endoleak Repair (N/A)     Patient location during evaluation: PACU Level of consciousness: awake, awake and alert and oriented Pain management: pain level controlled Vital Signs Assessment: post-procedure vital signs reviewed and stable Respiratory status: spontaneous breathing, nonlabored ventilation and respiratory function stable Cardiovascular status: blood pressure returned to baseline Postop Assessment: no headache Anesthetic complications: no    Last Vitals:  Vitals:   03/05/17 1315 03/05/17 1330  BP:    Pulse: 72 69  Resp: 12 13  Temp:    SpO2: 95% 95%    Last Pain:  Vitals:   03/05/17 1245  TempSrc:   PainSc: 0-No pain                 [name]

## 2017-03-05 NOTE — Anesthesia Preprocedure Evaluation (Signed)
Anesthesia Evaluation  Patient identified by MRN, date of birth, ID band Patient awake    Reviewed: Allergy & Precautions, NPO status , Patient's Chart, lab work & pertinent test results  Airway Mallampati: III  TM Distance: >3 FB Neck ROM: Full    Dental  (+) Edentulous Upper, Edentulous Lower   Pulmonary former smoker,    breath sounds clear to auscultation       Cardiovascular hypertension,  Rhythm:Regular Rate:Normal     Neuro/Psych    GI/Hepatic   Endo/Other  diabetes  Renal/GU      Musculoskeletal   Abdominal   Peds  Hematology   Anesthesia Other Findings   Reproductive/Obstetrics                             Anesthesia Physical Anesthesia Plan  ASA: III  Anesthesia Plan: General   Post-op Pain Management:    Induction: Intravenous  PONV Risk Score and Plan: Ondansetron  Airway Management Planned:   Additional Equipment:   Intra-op Plan:   Post-operative Plan:   Informed Consent:   Plan Discussed with:   Anesthesia Plan Comments:         Anesthesia Quick Evaluation

## 2017-03-05 NOTE — Progress Notes (Signed)
Foley catheter dcdd. Cath intact. Tolerated well.

## 2017-03-05 NOTE — Progress Notes (Signed)
dcd left radial aline, cath intact. Site unremarkable. Pressure dressing applied. Tolerated well.

## 2017-03-05 NOTE — H&P (Signed)
Chief Complaint: Patient was seen in consultation today for Type II endoleak repair  Referring Physician(s):  Dr. Ruta Hinds  History of Present Illness: Christian Mccall is a 61 y.o. male with past medical history of CAD, MI, CVA, COPD, HLD, HTN, DM, and AAA with type II endoleak and previous repair.  He met with Dr. Laurence Ferrari 02/05/17 due to new back pain and for further monitoring of his endoleak which was last intervened on 04/12/15 with direct sac puncture.  CTA Abdomen Pelvis 02/04/17 showed: 1. Patent infrarenal bifurcated stent graft with persistent type 2 endoleak, stable native sac diameter 6.3 cm.  Despite having a stable size in the aneurysm sac, there was concern for outpouching in the left posterolateral aspect of the sac.  After discussion with Dr. Laurence Ferrari, patient elects to proceed with intervention.   He presents for procedure today in his usual state of health.  He has been NPO.  He last look Plavix on 9/20.   Past Medical History:  Diagnosis Date  . AAA (abdominal aortic aneurysm) ([place])   . Anginal pain ([place])   . Bulging lumbar disc    "& some in my neck"  . CAD (coronary artery disease)   . Change in platelet count    pt states 'has been low since 2012; patient had to receive platelets in surgery in Nov 2016'  . Chronic back pain   . Cirrhosis ([place])   . COPD (chronic obstructive pulmonary disease) ([place])   . Diabetic peripheral neuropathy ([place])   . Emphysema lung ([place])   . Enlarged liver   . Enlarged prostate   . GERD (gastroesophageal reflux disease)   . Hardening of the arteries of the brain   . Headache    "~ weekly" (04/12/2015)  . Heart attack [facility]) Nov. 15,2016   TIA  . History of colon polyps   . Hyperlipidemia   . Hypertension   . Leg pain    with walking and pain in feet while lying flat  . Memory loss    Due to hardening of the arteries in the brain  . Myocardial infarction ([place])    "I've had probably 6-7" (04/12/2015)  .  Restless leg syndrome   . Shortness of breath 12/06/10   while lying flat and with exertion  . Snoring   . Spleen enlarged   . Stroke [facility]) 2004   denies residual on 04/12/2015  . Thrombocytopenia ([place])    since 2012; can be as low as 54K.   [name] TIA (transient ischemic attack) "several"   "since 2015" (04/12/2015)  . Type II diabetes mellitus ([place]) dx'd 2009    Past Surgical History:  Procedure Laterality Date  . ABDOMINAL AORTIC ANEURYSM REPAIR  01-16-11   EVAR  . ABDOMINAL AORTIC ANEURYSM REPAIR  04/12/2015  . CARDIAC CATHETERIZATION  "several"  . COLONOSCOPY    . COLONOSCOPY W/ BIOPSIES    . COLONOSCOPY WITH PROPOFOL N/A 03/21/2016   Procedure: COLONOSCOPY WITH PROPOFOL;  Surgeon: Arta Silence, MD;  Location: [facility] ENDOSCOPY;  Service: Endoscopy;  Laterality: N/A;  . CORONARY ANGIOPLASTY WITH STENT PLACEMENT  "several"   > 4 sents  . CORONARY ARTERY BYPASS GRAFT  02/18/2001   "CABG x 4"  . Direct sac puncture with liquid embolic repair of Type II endoleak  04/12/2015  . ESOPHAGOGASTRODUODENOSCOPY (EGD) WITH PROPOFOL N/A 03/21/2016   Procedure: ESOPHAGOGASTRODUODENOSCOPY (EGD) WITH PROPOFOL;  Surgeon: Arta Silence, MD;  Location[facility] ENDOSCOPY;  Service: Endoscopy;  Laterality: N/A;  .  PERIPHERAL VASCULAR CATHETERIZATION N/A 03/10/2015   Procedure: Abdominal Aortogram;  Surgeon: Elam Dutch, MD;  Location: [place] CV LAB;  Service: Cardiovascular;  Laterality: N/A;  . RADIOLOGY WITH ANESTHESIA N/A 04/12/2015   Procedure: embolization;  Surgeon: Jacqulynn Cadet, MD;  Location: [place];  Service: Radiology;  Laterality: N/A;  . SHOULDER SURGERY Right 07-27-2015  . TONSILLECTOMY  ~ 1965    Allergies: Patient has no known allergies.  Medications: Prior to Admission medications   Medication Sig Start Date End Date Taking? Authorizing Provider  albuterol (PROVENTIL HFA;VENTOLIN HFA) 108 (90 BASE) MCG/ACT inhaler Inhale 2 puffs into the lungs daily as needed for wheezing or  shortness of breath.    [provider]  aspirin EC 81 MG tablet Take 1 tablet (81 mg total) by mouth daily. 03/22/16   Arta Silence, MD  cholecalciferol (VITAMIN D) 1000 UNITS tablet Take 1,000 Units by mouth daily.     [provider]  clonazePAM (KLONOPIN) 1 MG tablet Take 1 mg by mouth.    [provider]  clopidogrel (PLAVIX) 75 MG tablet Take 1 tablet (75 mg total) by mouth daily. 03/25/16   Arta Silence, MD  cyanocobalamin (,VITAMIN B-12,) 1000 MCG/ML injection Inject 1,000 mcg into the muscle See admin instructions. Every 30 to 60 days    [provider]  empagliflozin (JARDIANCE) 10 MG TABS tablet Take 10 mg by mouth daily.    [provider]  escitalopram (LEXAPRO) 20 MG tablet Take 20 mg by mouth at bedtime.    [provider]  ezetimibe-simvastatin (VYTORIN) 10-40 MG per tablet Take 1 tablet by mouth at bedtime.     [provider]  fenofibrate 160 MG tablet Take 160 mg by mouth daily.  11/03/10   [provider]  Fluticasone-Salmeterol (ADVAIR) 100-50 MCG/DOSE AEPB Inhale 1 puff into the lungs 2 (two) times daily.     [provider]  furosemide (LASIX) 40 MG tablet Take 40 mg by mouth every other day.     [provider]  glimepiride (AMARYL) 4 MG tablet Take 4 mg by mouth every evening.     [provider]  lansoprazole (PREVACID) 30 MG capsule Take 30 mg by mouth daily.  03/11/14   [provider]  losartan (COZAAR) 50 MG tablet Take 50 mg by mouth daily.      [provider]  metFORMIN (GLUCOPHAGE) 1000 MG tablet Take 1,000 mg by mouth 2 (two) times daily. Breakfast and at bedtime    [provider]  metoprolol (LOPRESSOR) 50 MG tablet Take 50 mg by mouth 2 (two) times daily.     [provider]  nitroGLYCERIN (NITROSTAT) 0.4 MG SL tablet Place 0.4 mg under the tongue every 5 (five) minutes as needed for chest pain.    [provider]    oxyCODONE-acetaminophen (PERCOCET/ROXICET) 5-325 MG tablet Take 1 tablet by mouth every 6 (six) hours as needed for moderate pain. 01/06/17   Carmin Muskrat, MD  pramipexole (MIRAPEX) 0.25 MG tablet Take 0.25 mg by mouth at bedtime.  03/24/12   [provider]  pregabalin (LYRICA) 100 MG capsule Take 100-200 mg by mouth See admin instructions. Take 1 tablet (100 mg) by mouth every morning and 2 tablets (200 mg) at bedtime    [provider]  ranolazine (RANEXA) 1000 MG SR tablet Take 1,000 mg by mouth 2 (two) times daily.      [provider]  terazosin (HYTRIN) 1 MG capsule Take  1 mg by mouth at bedtime.     [provider]  tiotropium (SPIRIVA) 18 MCG inhalation capsule Place 18 mcg into inhaler and inhale daily.     [provider]     Family History  Problem Relation Age of Onset  . Aneurysm Mother        AAA  . Heart disease Mother        Heart Disease before age 66  . Other Mother        Carotid artery stenosis  . Hyperlipidemia Mother   . Hypertension Mother   . Lupus Sister   . Cancer Sister        Ovarian  . Heart disease Sister   . Heart disease Brother        Heart Disease before age 60  . Hypertension Brother   . Heart attack Brother   . Heart disease Brother        Before age 65  . Heart attack Brother   . Heart attack Brother   . Hyperlipidemia Father   . Hypertension Father   . Aneurysm Maternal Aunt        AAA  . Other Maternal Aunt        AAA  . Aneurysm Maternal Uncle        AAA  . Other Maternal Uncle        AAA    Social History   Social History  . Marital status: Married    Spouse name: reba  . Number of children: 2  . Years of education: 9   Occupational History  . disability    Social History Main Topics  . Smoking status: Former Smoker    Packs/day: 0.00    Years: 30.00    Types: Cigarettes    Quit date: 02/17/2001  . Smokeless tobacco: Never Used  . Alcohol use Yes     Comment:  occasional  . Drug use: No  . Sexual activity: Not Currently   Other Topics Concern  . Not on file   Social History Narrative   Patient lives at home with his wife.     Review of Systems  Constitutional: Negative for fatigue and fever.  Respiratory: Negative for cough and shortness of breath.   Cardiovascular: Negative for chest pain.  Gastrointestinal: Negative for abdominal pain.  Musculoskeletal: Positive for back pain (occassional, chronic).  Psychiatric/Behavioral: Negative for behavioral problems and confusion.    Vital Signs: There were no vitals taken for this visit.  Physical Exam  Constitutional: He is oriented to person, place, and time. He appears well-developed.  Cardiovascular: Normal rate, regular rhythm and normal heart sounds.   Pulmonary/Chest: Effort normal and breath sounds normal. No respiratory distress.  Abdominal: Soft.  Neurological: He is alert and oriented to person, place, and time.  Skin: Skin is warm and dry.  Psychiatric: He has a normal mood and affect. His behavior is normal. Judgment and thought content normal.  Nursing note and vitals reviewed.   Mallampati Score:  MD Evaluation Airway: WNL Heart: WNL Abdomen: WNL Chest/ Lungs: WNL ASA  Classification: 3 Mallampati/Airway Score: Two  Imaging: Ct Angio Abd/pel W/ And/or W/o  Result Date: 02/04/2017 CLINICAL DATA:  Endoleak post endovascular aneurysm, GABG, h/o cirrhosis, rt lat back pain x 70mo, CABG EXAM: CTA ABDOMEN AND PELVIS WITHOUT AND WITH CONTRAST TECHNIQUE: Multidetector CT imaging of the abdomen and pelvis was performed using the standard protocol during bolus administration of intravenous contrast. Multiplanar reconstructed images  and MIPs were obtained and reviewed to evaluate the vascular anatomy. CONTRAST:  100 mL Isovue 370 IV COMPARISON:  01/06/2017 and previous FINDINGS: VASCULAR Aorta: Visualized distal descending thoracic aorta unremarkable. Minimal atheromatous plaque  in the nondilated suprarenal segment. Patent infrarenal bifurcated stent graft. Embolization material in the posterior native aneurysm sac resulting in some streak artifact. Continued type 2 endoleak noted immediately inferior to the embolization material. Native aneurysm sac diameter 6.3 cm, stable since scans dating back to 02/16/2016 by my measurement. Celiac: Patent without evidence of aneurysm, dissection, vasculitis or significant stenosis. SMA: Patent without evidence of aneurysm, dissection, vasculitis or significant stenosis. Renals: Single right, with calcified ostial plaque resulting in mild stenosis. 3 left renal arteries, middle dominant, all patent. IMA: Origin occlusion, reconstituted distally by visceral collaterals. Inflow: Both limbs of the stent graft extend to the distal common iliac arteries, both well apposed. Native iliac arterial system remains patent without aneurysm or stenosis, with scattered atheromatous calcifications. Proximal Outflow: Bilateral common femoral and visualized portions of the superficial and profunda femoral arteries are patent without evidence of aneurysm, dissection, vasculitis or significant stenosis. Veins: Patent hepatic veins, portal vein, SMV, splenic vein, bilateral renal veins, IVC. Review of the MIP images confirms the above findings. NON-VASCULAR Hepatobiliary: Mildly nodular liver contour without focal lesion or biliary ductal dilatation. Gallbladder nondilated without calcified stones. Pancreas: Unremarkable. No pancreatic ductal dilatation or surrounding inflammatory changes. Spleen: Enlarged up to 19.7 cm craniocaudal length without focal lesion. Adrenals/Urinary Tract: Adrenal glands are unremarkable. Kidneys are normal, without renal calculi, focal lesion, or hydronephrosis. Bladder is unremarkable. Stomach/Bowel: Stomach and small bowel are nondilated. Normal appendix. Scattered distal descending colon diverticula without significant adjacent  inflammatory/edematous change. Colon is nondilated. No focal bowel wall thickening. Lymphatic: No abdominal or pelvic adenopathy. Reproductive: Prostatic enlargement with central coarse calcifications Other: No ascites.  No free air. Musculoskeletal: Linear embolization material in the posterior left pararenal retroperitoneal fat, stable. Anterior vertebral endplate spurring at multiple levels in the lower thoracic spine. Negative for fracture or worrisome bone lesion. IMPRESSION: VASCULAR 1. Patent infrarenal bifurcated stent graft with persistent type 2 endoleak, stable native sac diameter 6.3 cm. NON-VASCULAR 1. Nodular liver contour suggesting cirrhosis, with splenomegaly but no other stigmata of portal venous hypertension. 2. Distal descending colon diverticulosis. Electronically Signed   By: Lucrezia Europe M.D.   On: 02/04/2017 12:16    Labs:  CBC:  Recent Labs  04/24/16 0936 01/06/17 1813 01/22/17 0940 03/05/17 0621  WBC 5.5 4.8 4.9 4.9  HGB 14.1 14.9 14.3 14.4  HCT 42.0 43.8 42.6 42.4  PLT 73* 62* 60* 68*    COAGS:  Recent Labs  03/05/17 0621  INR 1.11  APTT 32    BMP:  Recent Labs  04/24/16 0936 01/06/17 1813 03/05/17 0621  NA 137 137 137  K 4.5 3.9 4.4  CL  --  101 101  CO2 _0 GLUCOSE 217* 89 210*  BUN 11._1 CALCIUM 9.3 9.5 9.3  CREATININE 1.0 0.89 0.99  GFRNONAA  --  >60 >60  GFRAA  --  >60 >60    LIVER FUNCTION TESTS:  Recent Labs  04/24/16 0936  BILITOT 0.69  AST 28  ALT 31  ALKPHOS 69  PROT 7.1  ALBUMIN 3.6    TUMOR MARKERS: No results for input(s): AFPTM, CEA, CA199, CHROMGRNA in the last 8760 hours.  Assessment and Plan: Patient with past medical history of AAA s/p repair presents with complaint of small  recurrent type II endoleak for which he has elected intervention after consultation with Dr. Laurence Ferrari.  Patient presents for procedure today in their usual state of health.  He has been NPO and has appropriately held his  Plavix.  He has platelets ready.  Risks and benefits of angiogram with possible intervention were discussed with the patient including, but not limited to bleeding, infection, vascular injury or contrast induced renal failure. This interventional procedure involves the use of X-rays and because of the nature of the planned procedure, it is possible that we will have prolonged use of X-ray fluoroscopy.  He is instructed to monitor the irradiated area for the 2 weeks following the procedure and to notify your physician if you are concerned that you have suffered a radiation induced injury.   All of the patient's questions were answered, patient is agreeable to proceed. Consent signed and in chart. He is aware he will be admitted overnight for observation.   Thank you for this interesting consult.  I greatly enjoyed meeting Christian Mccall and look forward to participating in their care.  A copy of this report was sent to the requesting provider on this date.  Electronically Signed: Docia Barrier 03/05/2017, 8:11 AM   I spent a total of    15 Minutes in face to face in clinical consultation, greater than 50% of which was counseling/coordinating care for type II endoleak

## 2017-03-05 NOTE — Procedures (Signed)
Interventional Radiology Procedure Note  Procedure: Direct sac puncture endoleak repair with liquid embolic.   Complications: None  Estimated Blood Loss: None  Recommendations:  - Bedrest x 6 hrs - Foley out after 6 hrs - ADAT to heart healthy - Anticipate DC in am   Signed,  Criselda Peaches, MD

## 2017-03-06 ENCOUNTER — Other Ambulatory Visit (HOSPITAL_COMMUNITY): Payer: Medicare HMO

## 2017-03-06 ENCOUNTER — Encounter (HOSPITAL_COMMUNITY): Payer: Self-pay | Admitting: Interventional Radiology

## 2017-03-06 ENCOUNTER — Ambulatory Visit: Payer: Commercial Managed Care - HMO | Admitting: Family

## 2017-03-06 DIAGNOSIS — I252 Old myocardial infarction: Secondary | ICD-10-CM | POA: Diagnosis not present

## 2017-03-06 DIAGNOSIS — E1142 Type 2 diabetes mellitus with diabetic polyneuropathy: Secondary | ICD-10-CM | POA: Diagnosis not present

## 2017-03-06 DIAGNOSIS — K219 Gastro-esophageal reflux disease without esophagitis: Secondary | ICD-10-CM | POA: Diagnosis not present

## 2017-03-06 DIAGNOSIS — G8929 Other chronic pain: Secondary | ICD-10-CM | POA: Diagnosis not present

## 2017-03-06 DIAGNOSIS — T82338A Leakage of other vascular grafts, initial encounter: Secondary | ICD-10-CM | POA: Diagnosis not present

## 2017-03-06 DIAGNOSIS — I2510 Atherosclerotic heart disease of native coronary artery without angina pectoris: Secondary | ICD-10-CM | POA: Diagnosis not present

## 2017-03-06 DIAGNOSIS — T82898A Other specified complication of vascular prosthetic devices, implants and grafts, initial encounter: Secondary | ICD-10-CM | POA: Diagnosis not present

## 2017-03-06 DIAGNOSIS — G2581 Restless legs syndrome: Secondary | ICD-10-CM | POA: Diagnosis not present

## 2017-03-06 DIAGNOSIS — E785 Hyperlipidemia, unspecified: Secondary | ICD-10-CM | POA: Diagnosis not present

## 2017-03-06 DIAGNOSIS — M549 Dorsalgia, unspecified: Secondary | ICD-10-CM | POA: Diagnosis not present

## 2017-03-06 LAB — CBC
HCT: 41.8 % (ref 39.0–52.0)
Hemoglobin: 13.9 g/dL (ref 13.0–17.0)
MCH: 30.8 pg (ref 26.0–34.0)
MCHC: 33.3 g/dL (ref 30.0–36.0)
MCV: 92.5 fL (ref 78.0–100.0)
Platelets: 82 10*3/uL — ABNORMAL LOW (ref 150–400)
RBC: 4.52 MIL/uL (ref 4.22–5.81)
RDW: 13.5 % (ref 11.5–15.5)
WBC: 5.5 10*3/uL (ref 4.0–10.5)

## 2017-03-06 LAB — PREPARE PLATELET PHERESIS
UNIT DIVISION: 0
UNIT DIVISION: 0

## 2017-03-06 LAB — BPAM PLATELET PHERESIS
BLOOD PRODUCT EXPIRATION DATE: 201809272359
Blood Product Expiration Date: 201809272359
ISSUE DATE / TIME: 201809260739
ISSUE DATE / TIME: 201809260842
UNIT TYPE AND RH: 6200
Unit Type and Rh: 6200

## 2017-03-06 NOTE — Discharge Summary (Signed)
Patient ID: Christian Mccall MRN: [id number] DOB/AGE: 60/13/1957 60 y.o.  Admit date: 03/05/2017 Discharge date: 03/06/2017  Supervising Physician: Corrie Mckusick  Admission Diagnoses: endoleak of aortic graft  Discharge Diagnoses:  Active Problems:   Endoleak of aortic graft [facility])   Discharged Condition: good  Hospital Course: The patient was admitted after he underwent a direct sac puncture endoleak repair with liquid embolic.  He has tolerated the procedure well.  He has minimal abdominal discomfort that has not needed any medications.  He has tolerated a solid diet with no nausea or vomiting.  He has no other complaints and is ready for DC home. His platelets are stable today at 82, which is actually slightly higher than on admission.  Consults: None  Discharge Exam: Blood pressure 119/66, pulse 72, temperature 98.3 F (36.8 C), temperature source Oral, resp. rate 15, height 5\' 11"  (1.803 m), weight 216 lb (98 kg), SpO2 95 %. General appearance: alert, cooperative and no distress Resp: clear to auscultation bilaterally Cardio: regular rate and rhythm GI: soft, non-tender; bowel sounds normal.  Left flank/back stick site is healed and nontender  Disposition: 01-Home or Self Care   Allergies as of 03/06/2017   No Known Allergies     Medication List    TAKE these medications   albuterol 108 (90 Base) MCG/ACT inhaler Commonly known as:  PROVENTIL HFA;VENTOLIN HFA Inhale 2 puffs into the lungs daily as needed for wheezing or shortness of breath.   aspirin EC 81 MG tablet Take 1 tablet (81 mg total) by mouth daily.   cholecalciferol 1000 units tablet Commonly known as:  VITAMIN D Take 1,000 Units by mouth daily.   clonazePAM 1 MG tablet Commonly known as:  KLONOPIN Take 1 mg by mouth.   clopidogrel 75 MG tablet Commonly known as:  PLAVIX Take 1 tablet (75 mg total) by mouth daily.   cyanocobalamin 1000 MCG/ML injection Commonly known as:  (VITAMIN B-12) Inject  1,000 mcg into the muscle See admin instructions. Every 30 to 60 days   escitalopram 20 MG tablet Commonly known as:  LEXAPRO Take 20 mg by mouth at bedtime.   ezetimibe-simvastatin 10-40 MG tablet Commonly known as:  VYTORIN Take 1 tablet by mouth at bedtime.   fenofibrate 160 MG tablet Take 160 mg by mouth daily.   Fluticasone-Salmeterol 100-50 MCG/DOSE Aepb Commonly known as:  ADVAIR Inhale 1 puff into the lungs 2 (two) times daily.   furosemide 40 MG tablet Commonly known as:  LASIX Take 40 mg by mouth every other day.   glimepiride 4 MG tablet Commonly known as:  AMARYL Take 4 mg by mouth every evening.   JARDIANCE 10 MG Tabs tablet Generic drug:  empagliflozin Take 10 mg by mouth daily.   lansoprazole 30 MG capsule Commonly known as:  PREVACID Take 30 mg by mouth daily.   losartan 50 MG tablet Commonly known as:  COZAAR Take 50 mg by mouth daily.   metFORMIN 1000 MG tablet Commonly known as:  GLUCOPHAGE Take 1,000 mg by mouth 2 (two) times daily. Breakfast and at bedtime   metoprolol tartrate 50 MG tablet Commonly known as:  LOPRESSOR Take 50 mg by mouth 2 (two) times daily.   nitroGLYCERIN 0.4 MG SL tablet Commonly known as:  NITROSTAT Place 0.4 mg under the tongue every 5 (five) minutes as needed for chest pain.   oxyCODONE-acetaminophen 5-325 MG tablet Commonly known as:  PERCOCET/ROXICET Take 1 tablet by mouth every 6 (six) hours as needed  for moderate pain.   pramipexole 0.25 MG tablet Commonly known as:  MIRAPEX Take 0.25 mg by mouth at bedtime.   pregabalin 100 MG capsule Commonly known as:  LYRICA Take 100-200 mg by mouth See admin instructions. Take 1 tablet (100 mg) by mouth every morning and 2 tablets (200 mg) at bedtime   RANEXA 1000 MG SR tablet Generic drug:  ranolazine Take 1,000 mg by mouth 2 (two) times daily.   terazosin 1 MG capsule Commonly known as:  HYTRIN Take 1 mg by mouth at bedtime.   tiotropium 18 MCG inhalation  capsule Commonly known as:  SPIRIVA Place 18 mcg into inhaler and inhale daily.            Discharge Care Instructions        Start     Ordered   03/06/17 0000  IR Radiologist Eval & Mgmt    Question Answer Comment  Reason for Exam (SYMPTOM  OR DIAGNOSIS REQUIRED) follow up with Laurence Ferrari in 2-4 weeks s/p endoleak repair   Preferred Imaging Location? [facility]      03/06/17 0940     Follow-up Information    Jacqulynn Cadet, MD Follow up in 3 week(s).   Specialties:  Interventional Radiology, Radiology Why:  our office will call you with appointment date and time Contact information: [address] [place] [address] [phone]            Electronically Signed: Henreitta Cea 03/06/2017, 9:44 AM   I have spent Less Than 30 Minutes discharging Christian Mccall.

## 2017-03-24 ENCOUNTER — Encounter: Payer: Self-pay | Admitting: Interventional Radiology

## 2017-03-25 ENCOUNTER — Other Ambulatory Visit: Payer: Self-pay | Admitting: Gastroenterology

## 2017-03-25 DIAGNOSIS — K7460 Unspecified cirrhosis of liver: Secondary | ICD-10-CM

## 2017-03-26 ENCOUNTER — Ambulatory Visit
Admission: RE | Admit: 2017-03-26 | Discharge: 2017-03-26 | Disposition: A | Discharge status: Home / Self Care | Payer: Medicare HMO | Source: Ambulatory Visit | Attending: Gastroenterology | Admitting: Gastroenterology

## 2017-03-26 DIAGNOSIS — K7460 Unspecified cirrhosis of liver: Secondary | ICD-10-CM

## 2017-03-31 DIAGNOSIS — H2513 Age-related nuclear cataract, bilateral: Secondary | ICD-10-CM | POA: Diagnosis not present

## 2017-03-31 DIAGNOSIS — H25013 Cortical age-related cataract, bilateral: Secondary | ICD-10-CM | POA: Diagnosis not present

## 2017-03-31 DIAGNOSIS — H40013 Open angle with borderline findings, low risk, bilateral: Secondary | ICD-10-CM | POA: Diagnosis not present

## 2017-03-31 DIAGNOSIS — E119 Type 2 diabetes mellitus without complications: Secondary | ICD-10-CM | POA: Diagnosis not present

## 2017-04-01 ENCOUNTER — Ambulatory Visit
Admission: RE | Admit: 2017-04-01 | Discharge: 2017-04-01 | Disposition: A | Discharge status: Home / Self Care | Payer: Medicare HMO | Source: Ambulatory Visit | Attending: General Surgery | Admitting: General Surgery

## 2017-04-01 DIAGNOSIS — T82330A Leakage of aortic (bifurcation) graft (replacement), initial encounter: Secondary | ICD-10-CM

## 2017-04-01 DIAGNOSIS — IMO0002 Reserved for concepts with insufficient information to code with codable children: Secondary | ICD-10-CM

## 2017-04-01 DIAGNOSIS — M545 Low back pain: Secondary | ICD-10-CM | POA: Diagnosis not present

## 2017-04-01 DIAGNOSIS — T82330S Leakage of aortic (bifurcation) graft (replacement), sequela: Secondary | ICD-10-CM | POA: Diagnosis not present

## 2017-04-01 HISTORY — PX: IR RADIOLOGIST EVAL & MGMT: IMG5224

## 2017-04-01 NOTE — Progress Notes (Signed)
Chief Complaint: F/U Repair of Endoleak  Referring Physician(s): Dr. Ruta Hinds  Supervising Physician: Jacqulynn Cadet  History of Present Illness: Christian Mccall is a 61 y.o. male with history of CAD, MI, CVA, COPD, HLD, HTN, DM, and AAA with type II endoleak and previous repair.    He met with Dr. Laurence Ferrari 02/05/17 due to new back pain and for further monitoring of his endoleak which was last intervened on 04/12/15 with direct sac puncture.  CTA Abdomen Pelvis 02/04/17 showed: 1. Patent infrarenal bifurcated stent graft with persistent type 2 endoleak, stable native sac diameter 6.3 cm.  Despite having a stable size in the aneurysm sac, there was concern for outpouching in the left posterolateral aspect of the sac.   He underwent a direct sac puncture with endoleak repair using liquid embolic on 2/45/8099.  He is here today for is post procedure follow up.  He only c/o some back pain today. He states it is lower than the access/puncture site.  He states it hurts constantly.  He also c/o some thigh claudication. He states he can make it out to his car in the parking lot without stopping so it is not disabling.  Past Medical History:  Diagnosis Date  . AAA (abdominal aortic aneurysm) ([place])   . Anginal pain ([place])   . Bulging lumbar disc    "& some in my neck"  . CAD (coronary artery disease)   . Change in platelet count    pt states 'has been low since 2012; patient had to receive platelets in surgery in Nov 2016'  . Chronic back pain   . Cirrhosis ([place])   . COPD (chronic obstructive pulmonary disease) ([place])   . Diabetic peripheral neuropathy ([place])   . Emphysema lung ([place])   . Enlarged liver   . Enlarged prostate   . GERD (gastroesophageal reflux disease)   . Hardening of the arteries of the brain   . Headache    "~ weekly" (04/12/2015)  . Heart attack [facility]) Nov. 15,2016   TIA  . History of colon polyps   . Hyperlipidemia   . Hypertension   . Leg  pain    with walking and pain in feet while lying flat  . Memory loss    Due to hardening of the arteries in the brain  . Myocardial infarction ([place])    "I've had probably 6-7" (04/12/2015)  . Restless leg syndrome   . Shortness of breath 12/06/10   while lying flat and with exertion  . Snoring   . Spleen enlarged   . Stroke [facility]) 2004   denies residual on 04/12/2015  . Thrombocytopenia ([place])    since 2012; can be as low as 54K.   [name] TIA (transient ischemic attack) "several"   "since 2015" (04/12/2015)  . Type II diabetes mellitus ([place]) dx'd 2009    Past Surgical History:  Procedure Laterality Date  . ABDOMINAL AORTIC ANEURYSM REPAIR  01/16/2011   EVAR  . ABDOMINAL AORTIC ANEURYSM REPAIR  04/12/2015  . CARDIAC CATHETERIZATION  "several"  . COLONOSCOPY    . COLONOSCOPY W/ BIOPSIES    . COLONOSCOPY WITH PROPOFOL N/A 03/21/2016   Procedure: COLONOSCOPY WITH PROPOFOL;  Surgeon: Arta Silence, MD;  Location: [facility] ENDOSCOPY;  Service: Endoscopy;  Laterality: N/A;  . CORONARY ANGIOPLASTY WITH STENT PLACEMENT  "several"   > 4 sents  . CORONARY ARTERY BYPASS GRAFT  02/18/2001   "CABG x 4"  . Direct sac puncture with liquid embolic  repair of Type II endoleak  04/12/2015; 03/05/2017  . ESOPHAGOGASTRODUODENOSCOPY (EGD) WITH PROPOFOL N/A 03/21/2016   Procedure: ESOPHAGOGASTRODUODENOSCOPY (EGD) WITH PROPOFOL;  Surgeon: Arta Silence, MD;  Location: [facility] ENDOSCOPY;  Service: Endoscopy;  Laterality: N/A;  . IR AORTAGRAM ABDOMINAL SERIALOGRAM  03/05/2017  . IR CT SPINE LTD  03/05/2017  . IR EMBO ARTERIAL NOT HEMORR HEMANG INC GUIDE ROADMAPPING  03/05/2017  . IR RADIOLOGIST EVAL & MGMT  02/05/2017  . PERIPHERAL VASCULAR CATHETERIZATION N/A 03/10/2015   Procedure: Abdominal Aortogram;  Surgeon: Elam Dutch, MD;  Location: [place] CV LAB;  Service: Cardiovascular;  Laterality: N/A;  . RADIOLOGY WITH ANESTHESIA N/A 04/12/2015   Procedure: embolization;  Surgeon: Jacqulynn Cadet, MD;  Location: [place];   Service: Radiology;  Laterality: N/A;  . RADIOLOGY WITH ANESTHESIA N/A 03/05/2017   Procedure: Type II Endoleak Repair;  Surgeon: Jacqulynn Cadet, MD;  Location: [place];  Service: Radiology;  Laterality: N/A;  . SHOULDER SURGERY Right 07-27-2015  . TONSILLECTOMY  ~ 1965    Allergies: Patient has no known allergies.  Medications: Prior to Admission medications   Medication Sig Start Date End Date Taking? Authorizing Provider  albuterol (PROVENTIL HFA;VENTOLIN HFA) 108 (90 BASE) MCG/ACT inhaler Inhale 2 puffs into the lungs daily as needed for wheezing or shortness of breath.    [provider]  aspirin EC 81 MG tablet Take 1 tablet (81 mg total) by mouth daily. 03/22/16   Arta Silence, MD  cholecalciferol (VITAMIN D) 1000 UNITS tablet Take 1,000 Units by mouth daily.     [provider]  clonazePAM (KLONOPIN) 1 MG tablet Take 1 mg by mouth.    [provider]  clopidogrel (PLAVIX) 75 MG tablet Take 1 tablet (75 mg total) by mouth daily. 03/25/16   Arta Silence, MD  cyanocobalamin (,VITAMIN B-12,) 1000 MCG/ML injection Inject 1,000 mcg into the muscle See admin instructions. Every 30 to 60 days    [provider]  empagliflozin (JARDIANCE) 10 MG TABS tablet Take 10 mg by mouth daily.    [provider]  escitalopram (LEXAPRO) 20 MG tablet Take 20 mg by mouth at bedtime.    [provider]  ezetimibe-simvastatin (VYTORIN) 10-40 MG per tablet Take 1 tablet by mouth at bedtime.     [provider]  fenofibrate 160 MG tablet Take 160 mg by mouth daily.  11/03/10   [provider]  Fluticasone-Salmeterol (ADVAIR) 100-50 MCG/DOSE AEPB Inhale 1 puff into the lungs 2 (two) times daily.     [provider]  furosemide (LASIX) 40 MG tablet Take 40 mg by mouth every other day.     [provider]  glimepiride (AMARYL) 4 MG tablet Take 4 mg by mouth every evening.     [provider]  lansoprazole  (PREVACID) 30 MG capsule Take 30 mg by mouth daily.  03/11/14   [provider]  losartan (COZAAR) 50 MG tablet Take 50 mg by mouth daily.      [provider]  metFORMIN (GLUCOPHAGE) 1000 MG tablet Take 1,000 mg by mouth 2 (two) times daily. Breakfast and at bedtime    [provider]  metoprolol (LOPRESSOR) 50 MG tablet Take 50 mg by mouth 2 (two) times daily.     [provider]  nitroGLYCERIN (NITROSTAT) 0.4 MG SL tablet Place 0.4 mg under the tongue every 5 (five) minutes as needed for chest pain.    [provider]  oxyCODONE-acetaminophen (PERCOCET/ROXICET) 5-325 MG tablet Take  1 tablet by mouth every 6 (six) hours as needed for moderate pain. 01/06/17   Carmin Muskrat, MD  pramipexole (MIRAPEX) 0.25 MG tablet Take 0.25 mg by mouth at bedtime.  03/24/12   [provider]  pregabalin (LYRICA) 100 MG capsule Take 100-200 mg by mouth See admin instructions. Take 1 tablet (100 mg) by mouth every morning and 2 tablets (200 mg) at bedtime    [provider]  ranolazine (RANEXA) 1000 MG SR tablet Take 1,000 mg by mouth 2 (two) times daily.      [provider]  terazosin (HYTRIN) 1 MG capsule Take 1 mg by mouth at bedtime.     [provider]  tiotropium (SPIRIVA) 18 MCG inhalation capsule Place 18 mcg into inhaler and inhale daily.     [provider]     Family History  Problem Relation Age of Onset  . Aneurysm Mother        AAA  . Heart disease Mother        Heart Disease before age 59  . Other Mother        Carotid artery stenosis  . Hyperlipidemia Mother   . Hypertension Mother   . Lupus Sister   . Cancer Sister        Ovarian  . Heart disease Sister   . Heart disease Brother        Heart Disease before age 71  . Hypertension Brother   . Heart attack Brother   . Heart disease Brother        Before age 107  . Heart attack Brother   . Heart attack Brother   . Hyperlipidemia Father   .  Hypertension Father   . Aneurysm Maternal Aunt        AAA  . Other Maternal Aunt        AAA  . Aneurysm Maternal Uncle        AAA  . Other Maternal Uncle        AAA    Social History   Social History  . Marital status: Married    Spouse name: reba  . Number of children: 2  . Years of education: 9   Occupational History  . disability    Social History Main Topics  . Smoking status: Former Smoker    Packs/day: 0.00    Years: 30.00    Types: Cigarettes    Quit date: 02/17/2001  . Smokeless tobacco: Never Used  . Alcohol use Yes     Comment: occasional  . Drug use: No  . Sexual activity: Not Currently   Other Topics Concern  . None   Social History Narrative   Patient lives at home with his wife.     Review of Systems: A 12 point ROS discussed  Review of Systems  Constitutional: Negative.   HENT: Negative.   Respiratory: Negative.   Cardiovascular: Negative.   Gastrointestinal: Negative.   Genitourinary: Negative.   Musculoskeletal: Negative.   Skin: Negative.   Neurological: Negative.   Hematological: Negative.   Psychiatric/Behavioral: Negative.     Vital Signs: BP (!) 160/76 (BP Location: Left Arm, Patient Position: Sitting, Cuff Size: Normal)   Pulse 76   Temp 97.8 F (36.6 C)   Resp 18   SpO2 96%   Physical Exam  Constitutional: He is oriented to person, place, and time. He appears well-developed.  HENT:  Head: Normocephalic and atraumatic.  Eyes: EOM are normal.  Neck: Normal range  of motion.  Cardiovascular: Normal rate, regular rhythm, normal heart sounds and intact distal pulses.   Pulmonary/Chest: Effort normal and breath sounds normal. No respiratory distress. He has no wheezes.  Abdominal: Soft. He exhibits no distension. There is no tenderness.  Musculoskeletal: Normal range of motion.       Back:  Diffuse back pain, non-tender to palpation. Stick site obviously healed as it is not seen on exam.  Neurological: He is alert and  oriented to person, place, and time.  Skin: Skin is warm and dry.  Psychiatric: He has a normal mood and affect. His behavior is normal. Judgment and thought content normal.  Vitals reviewed.   Imaging: US Abdomen Complete  Result Date: 03/26/2017 CLINICAL DATA:  Hepatic cirrhosis EXAM: ABDOMEN ULTRASOUND COMPLETE COMPARISON:  CT abdomen and pelvis February 04, 2017 FINDINGS: Gallbladder: No gallstones or wall thickening visualized. There is no pericholecystic fluid. No sonographic Murphy sign noted by sonographer. Common bile duct: Diameter: 2 mm. No intrahepatic, common hepatic, or common bile duct dilatation. Liver: No focal lesion identified. Liver echogenicity is inhomogeneous and increased. Echotexture is rather coarse. Portal vein is patent on color Doppler imaging with normal direction of blood flow towards the liver. IVC: No abnormality visualized. Pancreas: Visualized portion unremarkable. Portions of pancreas obscured by gas. Spleen: Spleen measures 18.1 x 18.3 x 8.8 cm with a measured splenic volume of 1,531 cubic cm. No focal splenic lesions are evident. Right Kidney: Length: 13.2 cm. Echogenicity within normal limits. No mass or hydronephrosis visualized. Left Kidney: Length: 15.1 cm. Echogenicity within normal limits. No mass or hydronephrosis visualized. Abdominal aorta: The patient has had aortic stent grafting with shadowing from the stent graft noted. No periaortic fluid evident. Maximum diameter of the aorta is difficult to ascertain with certainty given the shadowing from the stent. There does not appear to be enlargement of the aorta compared to recent CT examination. Other findings: No demonstrable ascites. IMPRESSION: 1. The echotexture of the liver is coarse and inhomogeneous with overall increase consistent with known cirrhosis. There may be superimposed hepatic steatosis is well. While no focal liver lesions are evident on this study, it must be cautioned that the sensitivity of  ultrasound for detection of focal liver lesions is diminished significantly in this circumstance. 2.  Splenomegaly without focal splenic lesion. 3. Stent graft in distal aorta. No periaortic fluid. No obvious change in the appearance of the aorta compared to recent CT. 4. Portions of pancreas obscured by gas. Visualized portions of pancreas normal in appearance. Aortic aneurysm NOS (ICD10-I71.9). Electronically Signed   By: Lowella Grip III M.D.   On: 03/26/2017 11:53   Ir Aortagram Abdominal Serialogram  Result Date: 03/05/2017 INDICATION: 61 year old male with a history of abdominal aortic aneurysm status post endovascular aortic repair complicated by a type 2 endoleak. The patient underwent a direct sac puncture endoleak repair in November of 2016 and did well for approximately 2 years before developing a re- current endoleak and slight enlargement of the left post row lateral aspect of the aneurysm sac. He presents today for repeat endoleak repair. EXAM: IR EMBO ARTERIAL NOT HEMORR HEMANG INC GUIDE ROADMAPPING; AORTOGRAPHY; IR CT SPINE LIMITED MEDICATIONS: None ANESTHESIA/SEDATION: General anesthesia by the anesthesiology department. CONTRAST:  45 mL Isovue 370 FLUOROSCOPY TIME:  Fluoroscopy Time: 9 minutes 12 seconds (1572 mGy). COMPLICATIONS: None immediate. PROCEDURE: Informed consent was obtained from the patient following explanation of the procedure, risks, benefits and alternatives. The patient understands, agrees and consents for the procedure.  All questions were addressed. A time out was performed prior to the initiation of the procedure. Maximal barrier sterile technique utilized including caps, mask, sterile gowns, sterile gloves, large sterile drape, hand hygiene, and Betadine prep. A suitable skin entry site was selected and marked based upon preprocedural measurements obtained on the prior CT arteriogram. The skin entry site was approximately 11.5 cm from the midline. A small dermatotomy  was made. Under real-time fluoroscopic guidance, an 18 gauge trocar needle was carefully advanced through the right retroperitoneal soft tissues and into the aneurysm sac. The position of the needle was evaluated several times using cone beam CT. The stylet was removed revealing pulsatile blood consistent with trocar placement in the endoleak. A hand injection of contrast material was performed. The contrast material opacifies the spongy thrombus within the aneurysm sac before filling of the left L3 lumbar artery. This is consistent with the suspected endoleak. A 2.8 mm Progreat catheter was advanced coaxially through the trocar needle. The catheter was advanced into the endoleak. Catheter position was confirmed fluoroscopically by contrast injection. Embolization was then performed using a combination of Onyx 36 and Onyx 18. All on ex was administered per protocol under real-time fluoroscopic guidance. There was no evidence of nontarget embolization. The on X fills the suspected region of the endoleak extremely well. Despite instilling a relatively large volume of on ex, beyond ex would not reflux into the L3 lumbar artery. However, I do believe that we successfully cover the origin of the artery. Once embolization was complete, the microcatheter was removed. As the trocar needle was removed, the needle tract was embolized with a Gel-Foam slurry. Post embolization images were obtained. The patient tolerated the procedure well. IMPRESSION: Technically successful revision endoleak repair via direct sac puncture. Signed, Criselda Peaches, MD [facility] Radiology Electronically Signed   By: Jacqulynn Cadet M.D.   On: 03/05/2017 14:16   [facility]  Result Date: 03/05/2017 INDICATION: 61 year old male with a history of abdominal aortic aneurysm status post endovascular aortic repair complicated by a type 2 endoleak. The patient underwent a direct sac  puncture endoleak repair in November of 2016 and did well for approximately 2 years before developing a re- current endoleak and slight enlargement of the left post row lateral aspect of the aneurysm sac. He presents today for repeat endoleak repair. EXAM: IR EMBO ARTERIAL NOT HEMORR HEMANG INC GUIDE ROADMAPPING; AORTOGRAPHY; IR CT SPINE LIMITED MEDICATIONS: None ANESTHESIA/SEDATION: General anesthesia by the anesthesiology department. CONTRAST:  45 mL Isovue 370 FLUOROSCOPY TIME:  Fluoroscopy Time: 9 minutes 12 seconds (1572 mGy). COMPLICATIONS: None immediate. PROCEDURE: Informed consent was obtained from the patient following explanation of the procedure, risks, benefits and alternatives. The patient understands, agrees and consents for the procedure. All questions were addressed. A time out was performed prior to the initiation of the procedure. Maximal barrier sterile technique utilized including caps, mask, sterile gowns, sterile gloves, large sterile drape, hand hygiene, and Betadine prep. A suitable skin entry site was selected and marked based upon preprocedural measurements obtained on the prior CT arteriogram. The skin entry site was approximately 11.5 cm from the midline. A small dermatotomy was made. Under real-time fluoroscopic guidance, an 18 gauge trocar needle was carefully advanced through the right retroperitoneal soft tissues and into the aneurysm sac. The position of the needle was evaluated several times using cone beam CT. The stylet was removed revealing pulsatile blood consistent with trocar placement in the endoleak. A hand  injection of contrast material was performed. The contrast material opacifies the spongy thrombus within the aneurysm sac before filling of the left L3 lumbar artery. This is consistent with the suspected endoleak. A 2.8 mm Progreat catheter was advanced coaxially through the trocar needle. The catheter was advanced into the endoleak. Catheter position was confirmed  fluoroscopically by contrast injection. Embolization was then performed using a combination of Onyx 36 and Onyx 18. All on ex was administered per protocol under real-time fluoroscopic guidance. There was no evidence of nontarget embolization. The on X fills the suspected region of the endoleak extremely well. Despite instilling a relatively large volume of on ex, beyond ex would not reflux into the L3 lumbar artery. However, I do believe that we successfully cover the origin of the artery. Once embolization was complete, the microcatheter was removed. As the trocar needle was removed, the needle tract was embolized with a Gel-Foam slurry. Post embolization images were obtained. The patient tolerated the procedure well. IMPRESSION: Technically successful revision endoleak repair via direct sac puncture. Signed, Criselda Peaches, MD [facility] Radiology Electronically Signed   By: Jacqulynn Cadet M.D.   On: 03/05/2017 14:16   Ir Embo Arterial Not [place] Guide Roadmapping  Result Date: 03/05/2017 INDICATION: 61 year old male with a history of abdominal aortic aneurysm status post endovascular aortic repair complicated by a type 2 endoleak. The patient underwent a direct sac puncture endoleak repair in November of 2016 and did well for approximately 2 years before developing a re- current endoleak and slight enlargement of the left post row lateral aspect of the aneurysm sac. He presents today for repeat endoleak repair. EXAM: IR EMBO ARTERIAL NOT HEMORR HEMANG INC GUIDE ROADMAPPING; AORTOGRAPHY; IR CT SPINE LIMITED MEDICATIONS: None ANESTHESIA/SEDATION: General anesthesia by the anesthesiology department. CONTRAST:  45 mL Isovue 370 FLUOROSCOPY TIME:  Fluoroscopy Time: 9 minutes 12 seconds (1572 mGy). COMPLICATIONS: None immediate. PROCEDURE: Informed consent was obtained from the patient following explanation of the procedure, risks, benefits and  alternatives. The patient understands, agrees and consents for the procedure. All questions were addressed. A time out was performed prior to the initiation of the procedure. Maximal barrier sterile technique utilized including caps, mask, sterile gowns, sterile gloves, large sterile drape, hand hygiene, and Betadine prep. A suitable skin entry site was selected and marked based upon preprocedural measurements obtained on the prior CT arteriogram. The skin entry site was approximately 11.5 cm from the midline. A small dermatotomy was made. Under real-time fluoroscopic guidance, an 18 gauge trocar needle was carefully advanced through the right retroperitoneal soft tissues and into the aneurysm sac. The position of the needle was evaluated several times using cone beam CT. The stylet was removed revealing pulsatile blood consistent with trocar placement in the endoleak. A hand injection of contrast material was performed. The contrast material opacifies the spongy thrombus within the aneurysm sac before filling of the left L3 lumbar artery. This is consistent with the suspected endoleak. A 2.8 mm Progreat catheter was advanced coaxially through the trocar needle. The catheter was advanced into the endoleak. Catheter position was confirmed fluoroscopically by contrast injection. Embolization was then performed using a combination of Onyx 36 and Onyx 18. All on ex was administered per protocol under real-time fluoroscopic guidance. There was no evidence of nontarget embolization. The on X fills the suspected region of the endoleak extremely well. Despite instilling a relatively large volume of on ex, beyond ex would not reflux into the  L3 lumbar artery. However, I do believe that we successfully cover the origin of the artery. Once embolization was complete, the microcatheter was removed. As the trocar needle was removed, the needle tract was embolized with a Gel-Foam slurry. Post embolization images were obtained. The  patient tolerated the procedure well. IMPRESSION: Technically successful revision endoleak repair via direct sac puncture. Signed, Criselda Peaches, MD [facility] Radiology Electronically Signed   By: Jacqulynn Cadet M.D.   On: 03/05/2017 14:16    Labs:  CBC:  Recent Labs  01/06/17 1813 01/22/17 0940 03/05/17 0621 03/06/17 0502  WBC 4.8 4.9 4.9 5.5  HGB 14.9 14.3 14.4 13.9  HCT 43.8 42.6 42.4 41.8  PLT 62* 60* 68* 82*    COAGS:  Recent Labs  03/05/17 0621  INR 1.11  APTT 32    BMP:  Recent Labs  04/24/16 0936 01/06/17 1813 03/05/17 0621  NA 137 137 137  K 4.5 3.9 4.4  CL  --  101 101  CO2 _0 GLUCOSE 217* 89 210*  BUN 11._1 CALCIUM 9.3 9.5 9.3  CREATININE 1.0 0.89 0.99  GFRNONAA  --  >60 >60  GFRAA  --  >60 >60    LIVER FUNCTION TESTS:  Recent Labs  04/24/16 0936  BILITOT 0.69  AST 28  ALT 31  ALKPHOS 69  PROT 7.1  ALBUMIN 3.6    TUMOR MARKERS: No results for input(s): AFPTM, CEA, CA199, CHROMGRNA in the last 8760 hours.  Assessment:  Patient seen by Dr. Laurence Ferrari today  Persistent endoleak.  S/P direct sac puncture with endoleak repair using liquid embolic on 03/04/4627.  Back pain is chronic and is not due to puncture site.  Return in 6 months with CTA to evaluate endoleak repair.  Electronically Signed: Murrell Redden PA-C 04/01/2017, 9:50 AM   Please refer to Dr. Katrinka Blazing attestation of this note for management and plan.

## 2017-04-09 ENCOUNTER — Encounter: Payer: Self-pay | Admitting: Interventional Radiology

## 2017-04-10 DIAGNOSIS — R82998 Other abnormal findings in urine: Secondary | ICD-10-CM | POA: Diagnosis not present

## 2017-04-10 DIAGNOSIS — E7849 Other hyperlipidemia: Secondary | ICD-10-CM | POA: Diagnosis not present

## 2017-04-10 DIAGNOSIS — I10 Essential (primary) hypertension: Secondary | ICD-10-CM | POA: Diagnosis not present

## 2017-04-10 DIAGNOSIS — D519 Vitamin B12 deficiency anemia, unspecified: Secondary | ICD-10-CM | POA: Diagnosis not present

## 2017-04-10 DIAGNOSIS — Z125 Encounter for screening for malignant neoplasm of prostate: Secondary | ICD-10-CM | POA: Diagnosis not present

## 2017-04-10 DIAGNOSIS — E1151 Type 2 diabetes mellitus with diabetic peripheral angiopathy without gangrene: Secondary | ICD-10-CM | POA: Diagnosis not present

## 2017-04-10 DIAGNOSIS — E559 Vitamin D deficiency, unspecified: Secondary | ICD-10-CM | POA: Diagnosis not present

## 2017-04-17 DIAGNOSIS — E7849 Other hyperlipidemia: Secondary | ICD-10-CM | POA: Diagnosis not present

## 2017-04-17 DIAGNOSIS — D518 Other vitamin B12 deficiency anemias: Secondary | ICD-10-CM | POA: Diagnosis not present

## 2017-04-17 DIAGNOSIS — E1151 Type 2 diabetes mellitus with diabetic peripheral angiopathy without gangrene: Secondary | ICD-10-CM | POA: Diagnosis not present

## 2017-04-17 DIAGNOSIS — Z23 Encounter for immunization: Secondary | ICD-10-CM | POA: Diagnosis not present

## 2017-04-17 DIAGNOSIS — E1140 Type 2 diabetes mellitus with diabetic neuropathy, unspecified: Secondary | ICD-10-CM | POA: Diagnosis not present

## 2017-04-17 DIAGNOSIS — I119 Hypertensive heart disease without heart failure: Secondary | ICD-10-CM | POA: Diagnosis not present

## 2017-04-17 DIAGNOSIS — Z0000 Encounter for general adult medical examination without abnormal findings: Secondary | ICD-10-CM | POA: Diagnosis not present

## 2017-04-17 DIAGNOSIS — I714 Abdominal aortic aneurysm, without rupture: Secondary | ICD-10-CM | POA: Diagnosis not present

## 2017-04-17 DIAGNOSIS — D692 Other nonthrombocytopenic purpura: Secondary | ICD-10-CM | POA: Diagnosis not present

## 2017-04-17 DIAGNOSIS — I208 Other forms of angina pectoris: Secondary | ICD-10-CM | POA: Diagnosis not present

## 2017-04-17 DIAGNOSIS — I7389 Other specified peripheral vascular diseases: Secondary | ICD-10-CM | POA: Diagnosis not present

## 2017-04-24 DIAGNOSIS — Z1212 Encounter for screening for malignant neoplasm of rectum: Secondary | ICD-10-CM | POA: Diagnosis not present

## 2017-07-22 DIAGNOSIS — K7460 Unspecified cirrhosis of liver: Secondary | ICD-10-CM | POA: Diagnosis not present

## 2017-07-22 DIAGNOSIS — Z86010 Personal history of colonic polyps: Secondary | ICD-10-CM | POA: Diagnosis not present

## 2017-07-29 ENCOUNTER — Telehealth: Payer: Self-pay | Admitting: Oncology

## 2017-07-29 NOTE — Telephone Encounter (Signed)
Patient's wife called in to cancel appt for 2/20 with Dr Alen Blew.

## 2017-07-30 ENCOUNTER — Other Ambulatory Visit: Payer: Medicare HMO

## 2017-07-30 ENCOUNTER — Ambulatory Visit: Payer: Medicare HMO | Admitting: Oncology

## 2017-08-26 ENCOUNTER — Telehealth: Payer: Self-pay

## 2017-08-26 DIAGNOSIS — K7469 Other cirrhosis of liver: Secondary | ICD-10-CM | POA: Diagnosis not present

## 2017-08-26 DIAGNOSIS — E7849 Other hyperlipidemia: Secondary | ICD-10-CM | POA: Diagnosis not present

## 2017-08-26 DIAGNOSIS — E1151 Type 2 diabetes mellitus with diabetic peripheral angiopathy without gangrene: Secondary | ICD-10-CM | POA: Diagnosis not present

## 2017-08-26 DIAGNOSIS — I209 Angina pectoris, unspecified: Secondary | ICD-10-CM | POA: Diagnosis not present

## 2017-08-26 DIAGNOSIS — I119 Hypertensive heart disease without heart failure: Secondary | ICD-10-CM | POA: Diagnosis not present

## 2017-08-26 DIAGNOSIS — D519 Vitamin B12 deficiency anemia, unspecified: Secondary | ICD-10-CM | POA: Diagnosis not present

## 2017-08-26 DIAGNOSIS — I7389 Other specified peripheral vascular diseases: Secondary | ICD-10-CM | POA: Diagnosis not present

## 2017-08-26 DIAGNOSIS — E1140 Type 2 diabetes mellitus with diabetic neuropathy, unspecified: Secondary | ICD-10-CM | POA: Diagnosis not present

## 2017-08-26 DIAGNOSIS — R413 Other amnesia: Secondary | ICD-10-CM | POA: Diagnosis not present

## 2017-08-26 NOTE — Telephone Encounter (Signed)
Sent referral to scheduling, attached notes to [facility] file.  

## 2017-09-04 ENCOUNTER — Other Ambulatory Visit (HOSPITAL_COMMUNITY): Payer: Self-pay | Admitting: Interventional Radiology

## 2017-09-04 DIAGNOSIS — IMO0001 Reserved for inherently not codable concepts without codable children: Secondary | ICD-10-CM

## 2017-09-04 DIAGNOSIS — T82330D Leakage of aortic (bifurcation) graft (replacement), subsequent encounter: Secondary | ICD-10-CM

## 2017-09-05 ENCOUNTER — Encounter: Payer: Self-pay | Admitting: *Deleted

## 2017-09-09 ENCOUNTER — Ambulatory Visit: Payer: Medicare HMO | Admitting: Nurse Practitioner

## 2017-09-09 ENCOUNTER — Encounter: Payer: Self-pay | Admitting: Nurse Practitioner

## 2017-09-09 VITALS — BP 128/60 | HR 61 | Ht 71.0 in | Wt 214.8 lb

## 2017-09-09 DIAGNOSIS — I25708 Atherosclerosis of coronary artery bypass graft(s), unspecified, with other forms of angina pectoris: Secondary | ICD-10-CM | POA: Diagnosis not present

## 2017-09-09 NOTE — Progress Notes (Signed)
CARDIOLOGY OFFICE NOTE  Date:  09/09/2017    Christian Mccall Date of Birth: [birth year] Medical Record #[id number]  PCP:  Shon Baton, MD  Cardiologist:  Jennings Books    Chief Complaint  Patient presents with  . Coronary Artery Disease    1 year check - seen for Dr. Tamala Julian    History of Present Illness: Christian Mccall is a 62 y.o. male who presents today for a one year check. Seen for Dr. Tamala Julian.   He has a history of known CAD with prior CABG back in 2002 per [facility], prior PCI's, prior AAA endovascular repair initially in 2012, COPD, cirrhosis, DM, prior MI, prior CVAs, OSA, HTN and HLD.  He is on chronic Plavix and aspirin. He is disabled and does not work outside the home.   Seen a year ago by Dr. Tamala Julian - felt to be stable. Had had no real change in his symptoms for the past several years. Noted more memory issues and his driving was discussed.   Comes in today. Here with his wife today. He feels like he is doing well. They are now camping - actually running a campground. They have enjoyed the traveling - renting their home to their son. He notes his chest pain is pretty much unchanged. Labs checked by PCP. He had to have another procedure on his AAA due to leak - this went ok - was given platelets. Had another stroke earlier in the year. Still falls asleep rather easily - still driving. Overall, he feels like he is doing ok.   Past Medical History:  Diagnosis Date  . AAA (abdominal aortic aneurysm) ([place])   . Anginal pain ([place])   . Bulging lumbar disc    "& some in my neck"  . CAD (coronary artery disease)   . Change in platelet count    pt states 'has been low since 2012; patient had to receive platelets in surgery in Nov 2016'  . Chronic back pain   . Cirrhosis ([place])   . COPD (chronic obstructive pulmonary disease) ([place])   . Diabetic peripheral neuropathy ([place])   . Emphysema lung ([place])   . Enlarged liver   . Enlarged prostate   . GERD (gastroesophageal reflux disease)    . Hardening of the arteries of the brain   . Headache    "~ weekly" (04/12/2015)  . Heart attack [facility]) Nov. 15,2016   TIA  . History of colon polyps   . Hyperlipidemia   . Hypertension   . Leg pain    with walking and pain in feet while lying flat  . Memory loss    Due to hardening of the arteries in the brain  . Myocardial infarction ([place])    "I've had probably 6-7" (04/12/2015)  . Restless leg syndrome   . Shortness of breath 12/06/10   while lying flat and with exertion  . Snoring   . Spleen enlarged   . Stroke [facility]) 2004   denies residual on 04/12/2015  . Thrombocytopenia ([place])    since 2012; can be as low as 54K.   [name] TIA (transient ischemic attack) "several"   "since 2015" (04/12/2015)  . Type II diabetes mellitus ([place]) dx'd 2009    Past Surgical History:  Procedure Laterality Date  . ABDOMINAL AORTIC ANEURYSM REPAIR  01/16/2011   EVAR  . ABDOMINAL AORTIC ANEURYSM REPAIR  04/12/2015  . CARDIAC CATHETERIZATION  "several"  . COLONOSCOPY    . COLONOSCOPY  W/ BIOPSIES    . COLONOSCOPY WITH PROPOFOL N/A 03/21/2016   Procedure: COLONOSCOPY WITH PROPOFOL;  Surgeon: Arta Silence, MD;  Location: [facility] ENDOSCOPY;  Service: Endoscopy;  Laterality: N/A;  . CORONARY ANGIOPLASTY WITH STENT PLACEMENT  "several"   > 4 sents  . CORONARY ARTERY BYPASS GRAFT  02/18/2001   "CABG x 4"  . Direct sac puncture with liquid embolic repair of Type II endoleak  04/12/2015; 03/05/2017  . ESOPHAGOGASTRODUODENOSCOPY (EGD) WITH PROPOFOL N/A 03/21/2016   Procedure: ESOPHAGOGASTRODUODENOSCOPY (EGD) WITH PROPOFOL;  Surgeon: Arta Silence, MD;  Location: [facility] ENDOSCOPY;  Service: Endoscopy;  Laterality: N/A;  . IR AORTAGRAM ABDOMINAL SERIALOGRAM  03/05/2017  . IR CT SPINE LTD  03/05/2017  . IR EMBO ARTERIAL NOT HEMORR HEMANG INC GUIDE ROADMAPPING  03/05/2017  . IR RADIOLOGIST EVAL & MGMT  02/05/2017  . IR RADIOLOGIST EVAL & MGMT  04/01/2017  . PERIPHERAL VASCULAR CATHETERIZATION N/A 03/10/2015   Procedure:  Abdominal Aortogram;  Surgeon: Elam Dutch, MD;  Location: [place] CV LAB;  Service: Cardiovascular;  Laterality: N/A;  . RADIOLOGY WITH ANESTHESIA N/A 04/12/2015   Procedure: embolization;  Surgeon: Jacqulynn Cadet, MD;  Location: [place];  Service: Radiology;  Laterality: N/A;  . RADIOLOGY WITH ANESTHESIA N/A 03/05/2017   Procedure: Type II Endoleak Repair;  Surgeon: Jacqulynn Cadet, MD;  Location: [place];  Service: Radiology;  Laterality: N/A;  . SHOULDER SURGERY Right 07-27-2015  . TONSILLECTOMY  ~ 1965     Medications: Current Meds  Medication Sig  . albuterol (PROVENTIL HFA;VENTOLIN HFA) 108 (90 BASE) MCG/ACT inhaler Inhale 2 puffs into the lungs daily as needed for wheezing or shortness of breath.  [name] aspirin EC 81 MG tablet Take 1 tablet (81 mg total) by mouth daily.  . cholecalciferol (VITAMIN D) 1000 UNITS tablet Take 1,000 Units by mouth daily.   . clonazePAM (KLONOPIN) 1 MG tablet Take 1 mg by mouth 2 (two) times daily.   . clopidogrel (PLAVIX) 75 MG tablet Take 1 tablet (75 mg total) by mouth daily.  . cyanocobalamin (,VITAMIN B-12,) 1000 MCG/ML injection Inject 1,000 mcg into the muscle See admin instructions. Every 30 to 60 days  . empagliflozin (JARDIANCE) 10 MG TABS tablet Take 10 mg by mouth daily.  [name] escitalopram (LEXAPRO) 20 MG tablet Take 20 mg by mouth at bedtime.  [name] ezetimibe-simvastatin (VYTORIN) 10-40 MG per tablet Take 1 tablet by mouth at bedtime.   . fenofibrate 160 MG tablet Take 160 mg by mouth daily.   . Fluticasone-Salmeterol (ADVAIR) 100-50 MCG/DOSE AEPB Inhale 1 puff into the lungs 2 (two) times daily.   . furosemide (LASIX) 40 MG tablet Take 40 mg by mouth every other day.   [name] glimepiride (AMARYL) 4 MG tablet Take 4 mg by mouth every evening.   . lansoprazole (PREVACID) 30 MG capsule Take 30 mg by mouth daily.   [name] losartan (COZAAR) 50 MG tablet Take 50 mg by mouth daily.    . metFORMIN (GLUCOPHAGE) 1000 MG tablet Take 1,000 mg by mouth 2 (two)  times daily. Breakfast and at bedtime  . metoprolol (LOPRESSOR) 50 MG tablet Take 50 mg by mouth 2 (two) times daily.   . nitroGLYCERIN (NITROSTAT) 0.4 MG SL tablet Place 0.4 mg under the tongue every 5 (five) minutes as needed for chest pain.  [name] oxyCODONE-acetaminophen (PERCOCET/ROXICET) 5-325 MG tablet Take 1 tablet by mouth every 6 (six) hours as needed for moderate pain.  . pramipexole (MIRAPEX) 0.25 MG tablet Take 0.25 mg by  mouth at bedtime.   . pregabalin (LYRICA) 100 MG capsule Take 100-200 mg by mouth See admin instructions. Take 1 tablet (100 mg) by mouth every morning and 2 tablets (200 mg) at bedtime  . ranolazine (RANEXA) 1000 MG SR tablet Take 1,000 mg by mouth 2 (two) times daily.    [name] terazosin (HYTRIN) 1 MG capsule Take 1 mg by mouth at bedtime.   [name] tiotropium (SPIRIVA) 18 MCG inhalation capsule Place 18 mcg into inhaler and inhale daily.      Allergies: No Known Allergies  Social History: The patient  reports that he quit smoking about 16 years ago. His smoking use included cigarettes. He smoked 0.00 packs per day for 30.00 years. He has never used smokeless tobacco. He reports that he drinks alcohol. He reports that he does not use drugs.   Family History: The patient's family history includes Aneurysm in his maternal aunt, maternal uncle, and mother; Cancer in his sister; Heart attack in his brother, brother, and brother; Heart disease in his brother, brother, mother, and sister; Hyperlipidemia in his father and mother; Hypertension in his brother, father, and mother; Lupus in his sister; Other in his maternal aunt, maternal uncle, and mother.   Review of Systems: Please see the history of present illness.   Otherwise, the review of systems is positive for none.   All other systems are reviewed and negative.   Physical Exam: VS:  BP 128/60 (BP Location: Left Arm, Patient Position: Sitting, Cuff Size: Normal)   Pulse 61   Ht 5\' 11"  (1.803 m)   Wt 214 lb 12.8 oz (97.4  kg)   BMI 29.96 kg/m  .  BMI Body mass index is 29.96 kg/m.  Wt Readings from Last 3 Encounters:  09/09/17 214 lb 12.8 oz (97.4 kg)  03/05/17 216 lb (98 kg)  01/30/17 217 lb (98.4 kg)    General: Pleasant. Alert and in no acute distress.   HEENT: Normal.  Neck: Supple, no JVD, carotid bruits, or masses noted.  Cardiac: Regular rate and rhythm. No murmurs, rubs, or gallops. No edema.  Respiratory:  Lungs are clear to auscultation bilaterally with normal work of breathing.  GI: Soft and nontender.  MS: No deformity or atrophy. Gait and ROM intact.  Skin: Warm and dry. Color is normal.  Neuro:  Strength and sensation are intact and no gross focal deficits noted.  Psych: Alert, appropriate and with normal affect.   LABORATORY DATA:  EKG:  EKG is ordered today. This demonstrates NSR  Lab Results  Component Value Date   WBC 5.5 03/06/2017   HGB 13.9 03/06/2017   HCT 41.8 03/06/2017   PLT 82 (L) 03/06/2017   GLUCOSE 210 (H) 03/05/2017   CHOL 129 05/02/2013   TRIG 230 (H) 05/02/2013   HDL 69 05/02/2013   LDLCALC 14 05/02/2013   ALT 31 04/24/2016   AST 28 04/24/2016   NA 137 03/05/2017   K 4.4 03/05/2017   CL 101 03/05/2017   CREATININE 0.99 03/05/2017   BUN 17 03/05/2017   CO2 26 03/05/2017   TSH 2.080 11/16/2013   INR 1.11 03/05/2017   HGBA1C 7.0 (H) 03/05/2017       BNP (last 3 results) No results for input(s): BNP in the last 8760 hours.  ProBNP (last 3 results) No results for input(s): PROBNP in the last 8760 hours.   Other Studies Reviewed Today:   Assessment/Plan:  1. CAD - extensive history - managed medically - doing well from  our standpoint. No changes made today. Encouraged him to continue with his CV risk factor modification. He knows what his limits are.   2. HTN - BP looks good on current regimen.   3. HLD - on statin - labs by PCP noted.   4. Hemiplegia - prior strokes  5. Prior AAA repair - had repeat intervention last fall.   6.  Memory/sleep issues - we talked about not driving again today.   Overall, he seems to be holding his own.   Current medicines are reviewed with the patient today.  The patient does not have concerns regarding medicines other than what has been noted above.  The following changes have been made:  See above.  Labs/ tests ordered today include:    Orders Placed This Encounter  Procedures  . EKG 12-Lead     Disposition:   FU with me or Dr. Tamala Julian in one year.   Patient is agreeable to this plan and will call if any problems develop in the interim.   SignedTruitt Merle, NP  09/09/2017 9:50 AM  [facility] [address] [place], [place]  [address] Phone: [phone] Fax: [phone]

## 2017-09-09 NOTE — Patient Instructions (Addendum)
We will be checking the following labs today - NONE   Medication Instructions:    Continue with your current medicines.     Testing/Procedures To Be Arranged:  N/A  Follow-Up:   See me or Dr. Tamala Julian in one year.     Other Special Instructions:   N/A    If you need a refill on your cardiac medications before your next appointment, please call your pharmacy.   Call the [place] office at [phone] if you have any questions, problems or concerns.

## 2017-09-10 ENCOUNTER — Other Ambulatory Visit: Payer: Self-pay | Admitting: Radiology

## 2017-10-01 ENCOUNTER — Other Ambulatory Visit: Payer: Self-pay | Admitting: Gastroenterology

## 2017-10-01 ENCOUNTER — Ambulatory Visit (HOSPITAL_COMMUNITY)
Admission: RE | Admit: 2017-10-01 | Discharge: 2017-10-01 | Disposition: A | Discharge status: Home / Self Care | Payer: Medicare HMO | Source: Ambulatory Visit | Attending: Interventional Radiology | Admitting: Interventional Radiology

## 2017-10-01 ENCOUNTER — Ambulatory Visit
Admission: RE | Admit: 2017-10-01 | Discharge: 2017-10-01 | Disposition: A | Discharge status: Home / Self Care | Payer: Medicare HMO | Source: Ambulatory Visit | Attending: Interventional Radiology | Admitting: Interventional Radiology

## 2017-10-01 ENCOUNTER — Encounter (HOSPITAL_COMMUNITY): Payer: Self-pay

## 2017-10-01 DIAGNOSIS — X58XXXD Exposure to other specified factors, subsequent encounter: Secondary | ICD-10-CM | POA: Insufficient documentation

## 2017-10-01 DIAGNOSIS — K449 Diaphragmatic hernia without obstruction or gangrene: Secondary | ICD-10-CM | POA: Diagnosis not present

## 2017-10-01 DIAGNOSIS — K5790 Diverticulosis of intestine, part unspecified, without perforation or abscess without bleeding: Secondary | ICD-10-CM | POA: Insufficient documentation

## 2017-10-01 DIAGNOSIS — T82330D Leakage of aortic (bifurcation) graft (replacement), subsequent encounter: Secondary | ICD-10-CM

## 2017-10-01 DIAGNOSIS — I774 Celiac artery compression syndrome: Secondary | ICD-10-CM | POA: Diagnosis not present

## 2017-10-01 DIAGNOSIS — T82330A Leakage of aortic (bifurcation) graft (replacement), initial encounter: Secondary | ICD-10-CM | POA: Diagnosis not present

## 2017-10-01 DIAGNOSIS — IMO0001 Reserved for inherently not codable concepts without codable children: Secondary | ICD-10-CM

## 2017-10-01 DIAGNOSIS — T82330S Leakage of aortic (bifurcation) graft (replacement), sequela: Secondary | ICD-10-CM | POA: Diagnosis not present

## 2017-10-01 DIAGNOSIS — K7460 Unspecified cirrhosis of liver: Secondary | ICD-10-CM

## 2017-10-01 HISTORY — PX: IR RADIOLOGIST EVAL & MGMT: IMG5224

## 2017-10-01 LAB — POCT I-STAT CREATININE: Creatinine, Ser: 0.9 mg/dL (ref 0.61–1.24)

## 2017-10-01 MED ORDER — IOPAMIDOL (ISOVUE-370) INJECTION 76%
INTRAVENOUS | Status: AC
  Administered 2017-10-01: 100 mL via INTRAVENOUS
  Filled 2017-10-01: qty 100

## 2017-10-01 MED ORDER — IOPAMIDOL (ISOVUE-370) INJECTION 76%
100.0000 mL | Freq: Once | INTRAVENOUS | Status: AC | PRN
  Administered 2017-10-01: 100 mL via INTRAVENOUS

## 2017-10-01 NOTE — Progress Notes (Signed)
Chief Complaint: Patient was seen in follow up today for  Chief Complaint  Patient presents with  . Follow-up    7 mo follow up Type 2 Endoleak Repair   at the request of Latanza Pfefferkorn  Referring Physician(s): Dr. Ruta Hinds  History of Present Illness: Christian Mccall is a 62 y.o. male with history of CAD, MI, CVA, COPD, HLD, HTN, DM, and AAA with type II endoleak and previous repair.   He met with Dr. Laurence Ferrari 02/05/17 due to new back pain and for further monitoring of his endoleak which was last intervened on 04/12/15 with direct sac puncture.  CTA Abdomen Pelvis 02/04/17 showed: 1. Patent infrarenal bifurcated stent graft with recurrent type 2 endoleak, stable native sac diameter 6.3 cm.  Despite having a stable size in the aneurysm sac, there was concern for outpouching in the left posterolateral aspect of the sac in the region of the new endoleak.  He underwent a direct sac puncture with endoleak repair using liquid embolic on 9/32/6712.  He presents today for six-month follow-up evaluation.  Christian Mccall is doing extremely well.  He currently has no clinical symptoms.  His back pain is gone.  [place]      Past Medical History:  Diagnosis Date  . AAA (abdominal aortic aneurysm) ([place])   . Anginal pain ([place])   . Bulging lumbar disc    "& some in my neck"  . CAD (coronary artery disease)   . Change in platelet count    pt states 'has been low since 2012; patient had to receive platelets in surgery in Nov 2016'  . Chronic back pain   . Cirrhosis ([place])   . COPD (chronic obstructive pulmonary disease) ([place])   . Diabetic peripheral neuropathy ([place])   . Emphysema lung ([place])   . Enlarged liver   . Enlarged prostate   . GERD (gastroesophageal reflux disease)   . Hardening of the arteries of the brain   . Headache    "~ weekly" (04/12/2015)  . Heart attack [facility]) Nov. 15,2016   TIA  . History of  colon polyps   . Hyperlipidemia   . Hypertension   . Leg pain    with walking and pain in feet while lying flat  . Memory loss    Due to hardening of the arteries in the brain  . Myocardial infarction ([place])    "I've had probably 6-7" (04/12/2015)  . Restless leg syndrome   . Shortness of breath 12/06/10   while lying flat and with exertion  . Snoring   . Spleen enlarged   . Stroke [facility]) 2004   denies residual on 04/12/2015  . Thrombocytopenia ([place])    since 2012; can be as low as 54K.   [name] TIA (transient ischemic attack) "several"   "since 2015" (04/12/2015)  . Type II diabetes mellitus ([place]) dx'd 2009    Past Surgical History:  Procedure Laterality Date  . ABDOMINAL AORTIC ANEURYSM REPAIR  01/16/2011   EVAR  . ABDOMINAL AORTIC ANEURYSM REPAIR  04/12/2015  . CARDIAC CATHETERIZATION  "several"  . COLONOSCOPY    . COLONOSCOPY W/ BIOPSIES    . COLONOSCOPY WITH PROPOFOL N/A 03/21/2016   Procedure: COLONOSCOPY WITH PROPOFOL;  Surgeon: Arta Silence, MD;  Location: [facility] ENDOSCOPY;  Service: Endoscopy;  Laterality: N/A;  . CORONARY ANGIOPLASTY WITH STENT PLACEMENT  "several"   > 4 sents  . CORONARY ARTERY BYPASS GRAFT  02/18/2001   "CABG x 4"  . Direct sac puncture with liquid embolic repair of Type II endoleak  04/12/2015; 03/05/2017  . ESOPHAGOGASTRODUODENOSCOPY (EGD) WITH PROPOFOL N/A 03/21/2016   Procedure: ESOPHAGOGASTRODUODENOSCOPY (EGD) WITH PROPOFOL;  Surgeon: Arta Silence, MD;  Location: [facility] ENDOSCOPY;  Service: Endoscopy;  Laterality: N/A;  . IR AORTAGRAM ABDOMINAL SERIALOGRAM  03/05/2017  . IR CT SPINE LTD  03/05/2017  . IR EMBO ARTERIAL NOT HEMORR HEMANG INC GUIDE ROADMAPPING  03/05/2017  . IR RADIOLOGIST EVAL & MGMT  02/05/2017  . IR RADIOLOGIST EVAL & MGMT  04/01/2017  . PERIPHERAL VASCULAR CATHETERIZATION N/A 03/10/2015   Procedure: Abdominal Aortogram;  Surgeon: Elam Dutch, MD;  Location: [place] CV LAB;  Service: Cardiovascular;  Laterality: N/A;  . RADIOLOGY WITH  ANESTHESIA N/A 04/12/2015   Procedure: embolization;  Surgeon: Jacqulynn Cadet, MD;  Location: [place];  Service: Radiology;  Laterality: N/A;  . RADIOLOGY WITH ANESTHESIA N/A 03/05/2017   Procedure: Type II Endoleak Repair;  Surgeon: Jacqulynn Cadet, MD;  Location: [place];  Service: Radiology;  Laterality: N/A;  . SHOULDER SURGERY Right 07-27-2015  . TONSILLECTOMY  ~ 1965    Allergies: Patient has no known allergies.  Medications: Prior to Admission medications   Medication Sig Start Date End Date Taking? Authorizing Provider  albuterol (PROVENTIL HFA;VENTOLIN HFA) 108 (90 BASE) MCG/ACT inhaler Inhale 2 puffs into the lungs daily as needed for wheezing or shortness of breath.    [provider]  aspirin EC 81 MG tablet Take 1 tablet (81 mg total) by mouth daily. 03/22/16   Arta Silence, MD  cholecalciferol (VITAMIN D) 1000 UNITS tablet Take 1,000 Units by mouth daily.     [provider]  clonazePAM (KLONOPIN) 1 MG tablet Take 1 mg by mouth 2 (two) times daily.     [provider]  clopidogrel (PLAVIX) 75 MG tablet Take 1 tablet (75 mg total) by mouth daily. 03/25/16   Arta Silence, MD  cyanocobalamin (,VITAMIN B-12,) 1000 MCG/ML injection Inject 1,000 mcg into the muscle See admin instructions. Every 30 to 60 days    [provider]  empagliflozin (JARDIANCE) 10 MG TABS tablet Take 10 mg by mouth daily.    [provider]  escitalopram (LEXAPRO) 20 MG tablet Take 20 mg by mouth at bedtime.    [provider]  ezetimibe-simvastatin (VYTORIN) 10-40 MG per tablet Take 1 tablet by mouth at bedtime.     [provider]  fenofibrate 160 MG tablet Take 160 mg by mouth daily.  11/03/10   [provider]  Fluticasone-Salmeterol (ADVAIR) 100-50 MCG/DOSE AEPB Inhale 1 puff into the lungs 2 (two) times daily.     [provider]  furosemide (LASIX) 40 MG tablet Take 40 mg by mouth every other day.     [provider]  glimepiride (AMARYL) 4 MG tablet Take 4 mg by mouth every evening.     [provider]  lansoprazole (PREVACID) 30 MG capsule Take 30 mg by mouth daily.  03/11/14   [provider]  losartan (COZAAR) 50 MG tablet Take 50 mg by mouth daily.      [provider]  metFORMIN (GLUCOPHAGE) 1000 MG tablet Take 1,000 mg by mouth 2 (two) times daily. Breakfast and at bedtime    [provider]  metoprolol (LOPRESSOR) 50 MG tablet Take 50 mg by mouth 2 (two) times daily.     [provider]  nitroGLYCERIN (NITROSTAT) 0.4 MG SL tablet  Place 0.4 mg under the tongue every 5 (five) minutes as needed for chest pain.    [provider]  oxyCODONE-acetaminophen (PERCOCET/ROXICET) 5-325 MG tablet Take 1 tablet by mouth every 6 (six) hours as needed for moderate pain. 01/06/17   Carmin Muskrat, MD  pramipexole (MIRAPEX) 0.25 MG tablet Take 0.25 mg by mouth at bedtime.  03/24/12   [provider]  pregabalin (LYRICA) 100 MG capsule Take 100-200 mg by mouth See admin instructions. Take 1 tablet (100 mg) by mouth every morning and 2 tablets (200 mg) at bedtime    [provider]  ranolazine (RANEXA) 1000 MG SR tablet Take 1,000 mg by mouth 2 (two) times daily.      [provider]  terazosin (HYTRIN) 1 MG capsule Take 1 mg by mouth at bedtime.     [provider]  tiotropium (SPIRIVA) 18 MCG inhalation capsule Place 18 mcg into inhaler and inhale daily.     [provider]     Family History  Problem Relation Age of Onset  . Aneurysm Mother        AAA  . Heart disease Mother        Heart Disease before age 45  . Other Mother        Carotid artery stenosis  . Hyperlipidemia Mother   . Hypertension Mother   . Lupus Sister   . Cancer Sister        Ovarian  . Heart disease Sister   . Heart disease Brother        Heart Disease before age 60  . Hypertension Brother   . Heart attack Brother   .  Heart disease Brother        Before age 18  . Heart attack Brother   . Heart attack Brother   . Hyperlipidemia Father   . Hypertension Father   . Aneurysm Maternal Aunt        AAA  . Other Maternal Aunt        AAA  . Aneurysm Maternal Uncle        AAA  . Other Maternal Uncle        AAA    Social History   Socioeconomic History  . Marital status: Married    Spouse name: reba  . Number of children: 2  . Years of education: 9  . Highest education level: Not on file  Occupational History  . Occupation: disability  Social Needs  . Financial resource strain: Not on file  . Food insecurity:    Worry: Not on file    Inability: Not on file  . Transportation needs:    Medical: Not on file    Non-medical: Not on file  Tobacco Use  . Smoking status: Former Smoker    Packs/day: 0.00    Years: 30.00    Pack years: 0.00    Types: Cigarettes    Last attempt to quit: 02/17/2001    Years since quitting: 16.6  . Smokeless tobacco: Never Used  Substance and Sexual Activity  . Alcohol use: Yes    Comment: occasional  . Drug use: No  . Sexual activity: Not Currently  Lifestyle  . Physical activity:    Days per week: Not on file    Minutes per session: Not on file  . Stress: Not on file  Relationships  . Social connections:    Talks on phone: Not on file    Gets together: Not on file  Attends religious service: Not on file    Active member of club or organization: Not on file    Attends meetings of clubs or organizations: Not on file    Relationship status: Not on file  Other Topics Concern  . Not on file  Social History Narrative   Patient lives at home with his wife.     Review of Systems: A 12 point ROS discussed and pertinent positives are indicated in the HPI above.  All other systems are negative.  Review of Systems  Vital Signs: BP (!) 159/80   Pulse 73   Resp 16   Ht 5' 11" (1.803 m)   Wt 219 lb (99.3 kg)   SpO2 96%   BMI 30.54 kg/m   Physical Exam    Constitutional: He is oriented to person, place, and time. He appears well-developed and well-nourished. No distress.  HENT:  Head: Normocephalic and atraumatic.  Eyes: No scleral icterus.  Cardiovascular: Normal rate and regular rhythm.  Pulmonary/Chest: Effort normal.  Abdominal: Soft. He exhibits no distension. There is no tenderness.  Neurological: He is alert and oriented to person, place, and time.  Skin: Skin is warm and dry.  Psychiatric: He has a normal mood and affect. His behavior is normal.  Nursing note and vitals reviewed.    Imaging: Ct Angio Abd/pel W/ And/or W/o  Result Date: 10/01/2017 CLINICAL DATA:  62 year old male with a history of abdominal aortic aneurysm, status post endo leak repair performed on 04/12/2015, then again 03/05/2017. EXAM: CTA ABDOMEN AND PELVIS wITHOUT AND WITH CONTRAST TECHNIQUE: Multidetector CT imaging of the abdomen and pelvis was performed using the standard protocol during bolus administration of intravenous contrast. Multiplanar reconstructed images and MIPs were obtained and reviewed to evaluate the vascular anatomy. CONTRAST:  171m ISOVUE-370 IOPAMIDOL (ISOVUE-370) INJECTION 76% COMPARISON:  CT 03/07/2015, endoleak repair 04/12/2015, CT 08/10/2015, 02/16/2016, 01/06/2017, 02/04/2017, endoleak repair 03/05/2017 FINDINGS: VASCULAR Aorta: Redemonstration of post surgical changes of endovascular repair of abdominal aortic aneurysm, as well as endoleak repair the degree of streak artifact at the level of the aneurysm is increased secondary to increased radiopaque/dense ethylene vinyl alcohol. Diameter of the aneurysm sac is unchanged from the most recent comparison CT studies, measuring approximately 6.1 cm. Questionable density at the posterior inferior aspect of the sac on the delayed images, potentially representing streak artifact. No inflammatory changes surrounding the aneurysm sac. No periaortic fluid. Celiac: Atherosclerotic changes at the  origin of the celiac artery with poststenotic dilation. Stenosis at the celiac artery origin, likely greater than 50% SMA: Atherosclerotic changes at the origin of the superior mesenteric artery Renals: 3 left renal arteries, all of which are patent. No significant stenosis at the origin of the main left renal artery. Symmetric perfusion to the right kidney. Atherosclerotic changes at the origin of the right renal artery with no significant stenosis. IMA: Inferior mesenteric artery has been excluded. Superior rectal artery and left colic artery are patent. Right lower extremity: The right iliac limb is patent. No evidence of a type 1 B endoleak. Hypogastric arteries patent with mild atherosclerotic changes. External iliac artery is patent with mild atherosclerotic changes. Proximal SFA and profunda femoris are patent. Left lower extremity: Left iliac limb is patent. No evidence of a type 1 B endoleak. Hypogastric arteries patent with mild atherosclerotic changes. External iliac artery is patent with mild atherosclerotic changes. Proximal SFA and profunda femoris patent. Veins: Unremarkable appearance of the venous system. Review of the MIP images confirms the above findings.  NON-VASCULAR Lower chest: No acute finding. Calcifications of the right coronary artery. Hepatobiliary: Nodular contour of liver parenchyma. No focal lesion identified. Unremarkable appearance of the gallbladder Pancreas: Unremarkable appearance of pancreas Spleen: Greatest diameter of the spleen measures 15.5 cm on axial images and 19-20 cm on coronal images. Adrenals/Urinary Tract: Unremarkable appearance of the adrenal glands. Right: No hydronephrosis. Symmetric perfusion to the left. No nephrolithiasis. Unremarkable course of the right ureter. Left: No hydronephrosis. Symmetric perfusion to the right. No nephrolithiasis. Unremarkable course of the left ureter. Unremarkable appearance of the urinary bladder . Stomach/Bowel: Small hiatal  hernia. Unremarkable appearance of the stomach. Unremarkable appearance of small bowel. No evidence of obstruction. Colonic diverticular disease without evidence of acute inflammatory changes. Normal appendix. Lymphatic: No lymphadenopathy. Mesenteric: No free fluid or air. No adenopathy. Linear density posterior to the left kidney, postoperative change from prior type 2 embolization. Reproductive: Coarse calcifications of the prostate Other: No hernia. Musculoskeletal: Degenerative changes of the spine. No bony canal narrowing. IMPRESSION: Postsurgical changes of endovascular repair of abdominal aortic aneurysm and embolization of type 2 endoleak. The excluded aneurysm sac is unchanged in size compared to the prior CT, with no definite CT evidence of persisting endoleak. Given the degree of streak artifact secondary to the embolization, abdominal ultrasound may be considered as an alternative to CT for further surveillance. 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 Stigmata of cirrhosis and portal hypertension. Diverticular disease with no evidence of acute diverticulitis. Signed, Dulcy Fanny. Earleen Newport, DO [facility] Electronically Signed   By: Corrie Mckusick D.O.   On: 10/01/2017 14:27    Labs:  CBC: Recent Labs    01/06/17 1813 01/22/17 0940 03/05/17 0621 03/06/17 0502  WBC 4.8 4.9 4.9 5.5  HGB 14.9 14.3 14.4 13.9  HCT 43.8 42.6 42.4 41.8  PLT 62* 60* 68* 82*    COAGS: Recent Labs    03/05/17 0621  INR 1.11  APTT 32    BMP: Recent Labs    01/06/17 1813 03/05/17 0621 10/01/17 1150  NA 137 137  --   K 3.9 4.4  --   CL 101 101  --   CO2 26 26  --   GLUCOSE 89 210*  --   BUN 11 17  --   CALCIUM 9.5 9.3  --   CREATININE 0.89 0.99 0.90  GFRNONAA >60 >60  --   GFRAA >60 >60  --     LIVER FUNCTION TESTS: No results for input(s):  BILITOT, AST, ALT, ALKPHOS, PROT, ALBUMIN in the last 8760 hours.  TUMOR MARKERS: No results for input(s): AFPTM, CEA, CA199, CHROMGRNA in the last 8760 hours.  Assessment and Plan:  Doing exceptionally well clinically, back to full activity.  No evidence of residual or recurrent endoleak on CTA.  The excluded sac size remains stable.  Successful type II endoleak repair.  1.)  Return clinic visit in 1 year with repeat CT arteriogram of the abdomen and pelvis.    Electronically Signed: Jacqulynn Cadet 10/01/2017, 2:39 PM   I spent a total of  15 Minutes in face to face in clinical consultation, greater than 50% of which was counseling/coordinating care for endoleak s/p repair x2.

## 2017-12-17 DIAGNOSIS — R413 Other amnesia: Secondary | ICD-10-CM | POA: Diagnosis not present

## 2017-12-17 DIAGNOSIS — E1151 Type 2 diabetes mellitus with diabetic peripheral angiopathy without gangrene: Secondary | ICD-10-CM | POA: Diagnosis not present

## 2017-12-17 DIAGNOSIS — D518 Other vitamin B12 deficiency anemias: Secondary | ICD-10-CM | POA: Diagnosis not present

## 2017-12-17 DIAGNOSIS — I878 Other specified disorders of veins: Secondary | ICD-10-CM | POA: Diagnosis not present

## 2017-12-17 DIAGNOSIS — I119 Hypertensive heart disease without heart failure: Secondary | ICD-10-CM | POA: Diagnosis not present

## 2017-12-17 DIAGNOSIS — F3289 Other specified depressive episodes: Secondary | ICD-10-CM | POA: Diagnosis not present

## 2017-12-17 DIAGNOSIS — D692 Other nonthrombocytopenic purpura: Secondary | ICD-10-CM | POA: Diagnosis not present

## 2017-12-17 DIAGNOSIS — I208 Other forms of angina pectoris: Secondary | ICD-10-CM | POA: Diagnosis not present

## 2017-12-17 DIAGNOSIS — J449 Chronic obstructive pulmonary disease, unspecified: Secondary | ICD-10-CM | POA: Diagnosis not present

## 2017-12-23 DIAGNOSIS — M25512 Pain in left shoulder: Secondary | ICD-10-CM | POA: Diagnosis not present

## 2017-12-29 DIAGNOSIS — J449 Chronic obstructive pulmonary disease, unspecified: Secondary | ICD-10-CM | POA: Diagnosis not present

## 2017-12-31 DIAGNOSIS — J449 Chronic obstructive pulmonary disease, unspecified: Secondary | ICD-10-CM | POA: Diagnosis not present

## 2018-01-07 DIAGNOSIS — J449 Chronic obstructive pulmonary disease, unspecified: Secondary | ICD-10-CM | POA: Diagnosis not present

## 2018-01-19 DIAGNOSIS — J449 Chronic obstructive pulmonary disease, unspecified: Secondary | ICD-10-CM | POA: Diagnosis not present

## 2018-01-19 DIAGNOSIS — Z6827 Body mass index (BMI) 27.0-27.9, adult: Secondary | ICD-10-CM | POA: Diagnosis not present

## 2018-01-19 DIAGNOSIS — I2510 Atherosclerotic heart disease of native coronary artery without angina pectoris: Secondary | ICD-10-CM | POA: Diagnosis not present

## 2018-02-07 DIAGNOSIS — J449 Chronic obstructive pulmonary disease, unspecified: Secondary | ICD-10-CM | POA: Diagnosis not present

## 2018-02-18 DIAGNOSIS — M7542 Impingement syndrome of left shoulder: Secondary | ICD-10-CM

## 2018-02-18 HISTORY — DX: Impingement syndrome of left shoulder: M75.42

## 2018-02-19 ENCOUNTER — Telehealth: Payer: Self-pay

## 2018-02-19 NOTE — Telephone Encounter (Signed)
   [facility] Pre-operative Risk Assessment    Request for surgical clearance:  1. What type of surgery is being performed?  Left shoulder:  LT SA, SAD, DCR, SA-DCR   2. When is this surgery scheduled?  TBD   3. What type of clearance is required (medical clearance vs. Pharmacy clearance to hold med vs. Both)?  Medical  4. Are there any medications that need to be held prior to surgery and how long?    5. Practice name and name of physician performing surgery?  [facility]/ Dr Onnie Graham   6. What is your office phone number [phone]    7.   What is your office fax number [phone]  8.   Anesthesia type (None, local, MAC, general) ?  general   Frederik Schmidt 02/19/2018, 8:37 AM  _________________________________________________________________   (provider comments below)

## 2018-02-20 NOTE — Telephone Encounter (Signed)
Plavix is not for coronary Stent. He has had stroke and neuro ordered. I think short term hold for 5 days will be okay.

## 2018-02-20 NOTE — Telephone Encounter (Signed)
   Primary Cardiologist: Sinclair Grooms, MD  Chart reviewed as part of pre-operative protocol coverage. Patient was contacted 02/20/2018 in reference to pre-operative risk assessment for pending surgery as outlined below.  Christian Mccall was last seen on 09/09/2017 by Truitt Merle.  Since that day, Christian Mccall has done well.  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  He is very active because of this, walking all around the campground all day long, helping people set up their camp sites and doing other things.  He is not having chest pain or shortness of breath with exertion.    He does sleep with oxygen at night, he states he was told that his oxygen levels dropped while he is asleep.  Therefore, based on ACC/AHA guidelines, the patient would be at acceptable risk for the planned procedure without further cardiovascular testing.   I will route this recommendation to the requesting party via Epic fax function and remove from pre-op pool.  Please call with questions.  Rosaria Ferries, PA-C 02/20/2018, 11:40 AM  **I will route this to Dr. Tamala Julian to address holding aspirin and Plavix for the surgery**

## 2018-03-09 DIAGNOSIS — J449 Chronic obstructive pulmonary disease, unspecified: Secondary | ICD-10-CM | POA: Diagnosis not present

## 2018-03-13 NOTE — Pre-Procedure Instructions (Signed)
Christian Mccall  03/13/2018      [id number] - [place], [place] - [phone] AT [place] [address] Phone: [phone] Fax: [phone]    Your procedure is scheduled on March 19, 2018.  Report to [facility] Admitting at 1100 AM.  Call this number if you have problems the morning of surgery:  [phone]   Remember:  Do not eat or drink after midnight.    Take these medicines the morning of surgery with A SIP OF WATER  Albuterol inhaler-if needed-please bring inhaler with you Clonazepam (klonopin) Advair inhaler Lansoprazole (prevacid) Ranolazine (ranexa) Spiriva Inhaler Oxycodone-acetaminophen (percocet)-if needed for pain Nitrostat-if needed for chest pain Follow your surgeon's instructions on when to hold/resume Plavix and aspirin.  If no instructions were given call the office to determine how they would like to you take plavix and aspirin  7 days prior to surgery STOP taking any Aspirin (unless otherwise instructed by your surgeon), Aleve, Naproxen, Ibuprofen, Motrin, Advil, Goody's, BC's, all herbal medications, fish oil, and all vitamins  WHAT DO I DO ABOUT MY DIABETES MEDICATION?  [name] Do not take oral diabetes medicines (pills) the morning of surgery-jardiance and metformin (glucophage)  . DO NOT take Glimepiride (amaryl) the night before surgery.  Reviewed and Endorsed by [facility] Patient Education Committee, August 2015   How to Manage Your Diabetes Before and After Surgery  Why is it important to control my blood sugar before and after surgery? . Improving blood sugar levels before and after surgery helps healing and can limit problems. . A way of improving blood sugar control is eating a healthy diet by: o  Eating less sugar and carbohydrates o  Increasing activity/exercise o  Talking with your doctor about reaching your blood sugar goals . High blood sugars  (greater than 180 mg/dL) can raise your risk of infections and slow your recovery, so you will need to focus on controlling your diabetes during the weeks before surgery. . Make sure that the doctor who takes care of your diabetes knows about your planned surgery including the date and location.  How do I manage my blood sugar before surgery? . Check your blood sugar at least 4 times a day, starting 2 days before surgery, to make sure that the level is not too high or low. o Check your blood sugar the morning of your surgery when you wake up and every 2 hours until you get to the Short Stay unit. . If your blood sugar is less than 70 mg/dL, you will need to treat for low blood sugar: o Do not take insulin. o Treat a low blood sugar (less than 70 mg/dL) with  cup of clear juice (cranberry or apple), 4 glucose tablets, OR glucose gel. Recheck blood sugar in 15 minutes after treatment (to make sure it is greater than 70 mg/dL). If your blood sugar is not greater than 70 mg/dL on recheck, call [phone] o  for further instructions. . Report your blood sugar to the short stay nurse when you get to Short Stay.  . If you are admitted to the hospital after surgery: o Your blood sugar will be checked by the staff and you will probably be given insulin after surgery (instead of oral diabetes medicines) to make sure you have good blood sugar levels. o The goal for blood sugar control after surgery is 80-180 mg/dL.   [place]- Preparing For  Surgery  Before surgery, you can play an important role. Because skin is not sterile, your skin needs to be as free of germs as possible. You can reduce the number of germs on your skin by washing with CHG (chlorahexidine gluconate) Soap before surgery.  CHG is an antiseptic cleaner which kills germs and bonds with the skin to continue killing germs even after washing.    Oral Hygiene is also important to reduce your risk of infection.  Remember - BRUSH YOUR  TEETH THE MORNING OF SURGERY WITH YOUR REGULAR TOOTHPASTE  Please do not use if you have an allergy to CHG or antibacterial soaps. If your skin becomes reddened/irritated stop using the CHG.  Do not shave (including legs and underarms) for at least 48 hours prior to first CHG shower. It is OK to shave your face.  Please follow these instructions carefully.   1. Shower the NIGHT BEFORE SURGERY and the MORNING OF SURGERY with CHG.   2. If you chose to wash your hair, wash your hair first as usual with your normal shampoo.  3. After you shampoo, rinse your hair and body thoroughly to remove the shampoo.  4. Use CHG as you would any other liquid soap. You can apply CHG directly to the skin and wash gently with a scrungie or a clean washcloth.   5. Apply the CHG Soap to your body ONLY FROM THE NECK DOWN.  Do not use on open wounds or open sores. Avoid contact with your eyes, ears, mouth and genitals (private parts). Wash Face and genitals (private parts)  with your normal soap.  6. Wash thoroughly, paying special attention to the area where your surgery will be performed.  7. Thoroughly rinse your body with warm water from the neck down.  8. DO NOT shower/wash with your normal soap after using and rinsing off the CHG Soap.  9. Pat yourself dry with a CLEAN TOWEL.  10. Wear CLEAN PAJAMAS to bed the night before surgery, wear comfortable clothes the morning of surgery  11. Place CLEAN SHEETS on your bed the night of your first shower and DO NOT SLEEP WITH PETS.  Day of Surgery:  Do not apply any deodorants/lotions.  Please wear clean clothes to the hospital/surgery center.   Remember to brush your teeth WITH YOUR REGULAR TOOTHPASTE.   Do not wear jewelry  Do not wear lotions, powders, or colognes, or deodorant.  Men may shave face and neck.  Do not bring valuables to the hospital.   [facility] is not responsible for any belongings or valuables.  Contacts, dentures or bridgework  may not be worn into surgery.  Leave your suitcase in the car.  After surgery it may be brought to your room.  For patients admitted to the hospital, discharge time will be determined by your treatment team.  Patients discharged the day of surgery will not be allowed to drive home.   Please read over the fact sheets that you were given.

## 2018-03-16 ENCOUNTER — Encounter (HOSPITAL_COMMUNITY): Payer: Self-pay

## 2018-03-16 ENCOUNTER — Other Ambulatory Visit: Payer: Self-pay

## 2018-03-16 ENCOUNTER — Encounter (HOSPITAL_COMMUNITY)
Admission: RE | Admit: 2018-03-16 | Discharge: 2018-03-16 | Disposition: A | Discharge status: Home / Self Care | Payer: Medicare HMO | Source: Ambulatory Visit | Attending: Orthopedic Surgery | Admitting: Orthopedic Surgery

## 2018-03-16 DIAGNOSIS — Z7984 Long term (current) use of oral hypoglycemic drugs: Secondary | ICD-10-CM

## 2018-03-16 DIAGNOSIS — Z951 Presence of aortocoronary bypass graft: Secondary | ICD-10-CM

## 2018-03-16 DIAGNOSIS — M25812 Other specified joint disorders, left shoulder: Secondary | ICD-10-CM | POA: Insufficient documentation

## 2018-03-16 DIAGNOSIS — Z79899 Other long term (current) drug therapy: Secondary | ICD-10-CM

## 2018-03-16 DIAGNOSIS — Z8673 Personal history of transient ischemic attack (TIA), and cerebral infarction without residual deficits: Secondary | ICD-10-CM | POA: Insufficient documentation

## 2018-03-16 DIAGNOSIS — K219 Gastro-esophageal reflux disease without esophagitis: Secondary | ICD-10-CM | POA: Insufficient documentation

## 2018-03-16 DIAGNOSIS — J449 Chronic obstructive pulmonary disease, unspecified: Secondary | ICD-10-CM | POA: Insufficient documentation

## 2018-03-16 DIAGNOSIS — Z87891 Personal history of nicotine dependence: Secondary | ICD-10-CM | POA: Insufficient documentation

## 2018-03-16 DIAGNOSIS — E1142 Type 2 diabetes mellitus with diabetic polyneuropathy: Secondary | ICD-10-CM | POA: Diagnosis not present

## 2018-03-16 DIAGNOSIS — I10 Essential (primary) hypertension: Secondary | ICD-10-CM | POA: Insufficient documentation

## 2018-03-16 DIAGNOSIS — J439 Emphysema, unspecified: Secondary | ICD-10-CM | POA: Diagnosis not present

## 2018-03-16 DIAGNOSIS — M19012 Primary osteoarthritis, left shoulder: Secondary | ICD-10-CM | POA: Insufficient documentation

## 2018-03-16 DIAGNOSIS — I252 Old myocardial infarction: Secondary | ICD-10-CM

## 2018-03-16 DIAGNOSIS — Z01812 Encounter for preprocedural laboratory examination: Secondary | ICD-10-CM | POA: Insufficient documentation

## 2018-03-16 DIAGNOSIS — Z7982 Long term (current) use of aspirin: Secondary | ICD-10-CM | POA: Insufficient documentation

## 2018-03-16 DIAGNOSIS — Z7951 Long term (current) use of inhaled steroids: Secondary | ICD-10-CM | POA: Diagnosis not present

## 2018-03-16 DIAGNOSIS — I714 Abdominal aortic aneurysm, without rupture: Secondary | ICD-10-CM

## 2018-03-16 DIAGNOSIS — Z9889 Other specified postprocedural states: Secondary | ICD-10-CM | POA: Insufficient documentation

## 2018-03-16 DIAGNOSIS — E785 Hyperlipidemia, unspecified: Secondary | ICD-10-CM | POA: Insufficient documentation

## 2018-03-16 DIAGNOSIS — Z9981 Dependence on supplemental oxygen: Secondary | ICD-10-CM | POA: Diagnosis not present

## 2018-03-16 DIAGNOSIS — I2510 Atherosclerotic heart disease of native coronary artery without angina pectoris: Secondary | ICD-10-CM | POA: Insufficient documentation

## 2018-03-16 DIAGNOSIS — Z7902 Long term (current) use of antithrombotics/antiplatelets: Secondary | ICD-10-CM | POA: Diagnosis not present

## 2018-03-16 DIAGNOSIS — G2581 Restless legs syndrome: Secondary | ICD-10-CM

## 2018-03-16 DIAGNOSIS — Z955 Presence of coronary angioplasty implant and graft: Secondary | ICD-10-CM | POA: Diagnosis not present

## 2018-03-16 HISTORY — DX: Rheumatic fever without heart involvement: I00

## 2018-03-16 HISTORY — DX: Enterocolitis due to Clostridium difficile, not specified as recurrent: A04.72

## 2018-03-16 HISTORY — DX: Restless legs syndrome: G25.81

## 2018-03-16 LAB — SURGICAL PCR SCREEN
MRSA, PCR: NEGATIVE
STAPHYLOCOCCUS AUREUS: NEGATIVE

## 2018-03-16 LAB — COMPREHENSIVE METABOLIC PANEL
ALBUMIN: 4 g/dL (ref 3.5–5.0)
ALK PHOS: 61 U/L (ref 38–126)
ALT: 40 U/L (ref 0–44)
AST: 43 U/L — AB (ref 15–41)
Anion gap: 8 (ref 5–15)
BILIRUBIN TOTAL: 1.8 mg/dL — AB (ref 0.3–1.2)
BUN: 8 mg/dL (ref 8–23)
CALCIUM: 9.2 mg/dL (ref 8.9–10.3)
CO2: 22 mmol/L (ref 22–32)
CREATININE: 0.76 mg/dL (ref 0.61–1.24)
Chloride: 102 mmol/L (ref 98–111)
GFR calc Af Amer: >60 mL/min (ref >60)
GLUCOSE: 294 mg/dL — AB (ref 70–99)
Potassium: 4.4 mmol/L (ref 3.5–5.1)
Sodium: 132 mmol/L — ABNORMAL LOW (ref 135–145)
TOTAL PROTEIN: 7 g/dL (ref 6.5–8.1)

## 2018-03-16 LAB — CBC
HEMATOCRIT: 46.7 % (ref 39.0–52.0)
HEMOGLOBIN: 15.9 g/dL (ref 13.0–17.0)
MCH: 31.3 pg (ref 26.0–34.0)
MCHC: 34 g/dL (ref 30.0–36.0)
MCV: 91.9 fL (ref 78.0–100.0)
Platelets: 70 10*3/uL — ABNORMAL LOW (ref 150–400)
RBC: 5.08 MIL/uL (ref 4.22–5.81)
RDW: 12.7 % (ref 11.5–15.5)
WBC: 5.3 10*3/uL (ref 4.0–10.5)

## 2018-03-16 LAB — HEMOGLOBIN A1C
HEMOGLOBIN A1C: 9.2 % — AB (ref 4.8–5.6)
Mean Plasma Glucose: 217.34 mg/dL

## 2018-03-16 LAB — GLUCOSE, CAPILLARY: GLUCOSE-CAPILLARY: 315 mg/dL — AB (ref 70–99)

## 2018-03-16 MED ORDER — SODIUM CHLORIDE 0.9% IV SOLUTION: Freq: Once | INTRAVENOUS | Status: DC

## 2018-03-16 NOTE — Progress Notes (Signed)
   03/16/18 0909  OBSTRUCTIVE SLEEP APNEA  Have you ever been diagnosed with sleep apnea through a sleep study? No  Do you snore loudly (loud enough to be heard through closed doors)?  1  Do you often feel tired, fatigued, or sleepy during the daytime (such as falling asleep during driving or talking to someone)? 1  Has anyone observed you stop breathing during your sleep? 1  Do you have, or are you being treated for high blood pressure? 1  BMI more than 35 kg/m2? 0  Age > 50 (1-yes) 1  Neck circumference greater than:Male 16 inches or larger, Male 17inches or larger? 0  Male Gender (Yes=1) 1  Obstructive Sleep Apnea Score 6  Score 5 or greater  Results sent to PCP

## 2018-03-16 NOTE — Progress Notes (Addendum)
PCP is Dr. Aviva Signs  LOV 01/2018  Dr. Virgina Jock also manages his diabetes and did warn him that if his sugars are high on the day of surgery, it could be cancelled (which I reiterated) At  first he told me he only checks his sugar monthly, but then wife said, he is trying to do them daily.  Last A1C he believes was 10 at Dr. Keane Police office. Blood sugars range from 60 - 400.  Today it was 315.  He said he didn't take his medication this am. Cardio is Dr. Linard Millers  LOV 09/2017, with clearance note in epic by Truitt Merle. He has been instructed to stop his plavix & aspirin -- last dose on 10/4 Oncologist is Dr. Dahlia Byes, lov 01/2017 [facility]  In 2002 by ? Prescott Gum He & wife both state he's had C-diff before - yrs ago. H/o ITP -  I have spoke with Zach in blood bank regarding the possible transfusing of platelets prior to going back to OR.

## 2018-03-16 NOTE — Pre-Procedure Instructions (Addendum)
Christian Mccall  03/16/2018      [id number] - [place], [place] - [phone] AT [address] [address] Phone: [phone] Fax: [phone]    Your procedure is scheduled on Thursday, March 19, 2018.   Report to [facility] Admitting at 1100 AM.             (posted surgery time 1p - 3p)   Call this number if you have problems the morning of surgery:  [phone]   Remember:   Do not eat any foods or drink any liquids after midnight, Wednesday.    Take these medicines the morning of surgery with A SIP OF WATER : Albuterol inhaler-if needed-please bring inhaler with you Clonazepam (klonopin) Advair inhaler Lansoprazole (prevacid) Ranolazine (ranexa) Spiriva Inhaler Oxycodone-acetaminophen (percocet)-if needed for pain Nitrostat-if needed for chest pain Follow your surgeon's instructions on when to hold/resume Plavix and aspirin.  If no instructions were given call the office to determine how they would like to you take plavix and aspirin  7 days prior to surgery STOP taking any Aspirin (unless otherwise instructed by your surgeon), Aleve, Naproxen, Ibuprofen, Motrin, Advil, Goody's, BC's, all herbal medications, fish oil, and all vitamins  WHAT DO I DO ABOUT MY DIABETES MEDICATION?  [name] Do not take oral diabetes medicines (pills) the morning of surgery-jardiance and metformin (glucophage)  . DO NOT take Glimepiride (amaryl) the night before surgery.  How to Manage Your Diabetes Before and After Surgery  Why is it important to control my blood sugar before and after surgery? . Improving blood sugar levels before and after surgery helps healing and can limit problems. . A way of improving blood sugar control is eating a healthy diet by: o  Eating less sugar and carbohydrates o  Increasing activity/exercise o  Talking with your doctor about reaching your blood sugar goals . High  blood sugars (greater than 180 mg/dL) can raise your risk of infections and slow your recovery, so you will need to focus on controlling your diabetes during the weeks before surgery. . Make sure that the doctor who takes care of your diabetes knows about your planned surgery including the date and location.  How do I manage my blood sugar before surgery? . Check your blood sugar at least 4 times a day, starting 2 days before surgery, to make sure that the level is not too high or low. o Check your blood sugar the morning of your surgery when you wake up and every 2 hours until you get to the Short Stay unit. o  . If your blood sugar is less than 70 mg/dL, you will need to treat for low blood sugar: o Do not take insulin. o Treat a low blood sugar (less than 70 mg/dL) with  cup of clear juice (cranberry or apple), 4 glucose tablets, OR glucose gel.  NO ORANGE JUICE o  Recheck blood sugar in 15 minutes after treatment (to make sure it is greater than 70 mg/dL). If your blood sugar is not greater than 70 mg/dL on recheck, call [phone] o  for further instructions. o  . Report your blood sugar to the short stay nurse when you get to Short Stay.  . If you are admitted to the hospital after surgery: o Your blood sugar will be checked by the staff and you will probably be given insulin after surgery (instead of oral diabetes medicines) to make sure  you have good blood sugar levels. o The goal for blood sugar control after surgery is 80-180 mg/dL.    [place]- Preparing For Surgery  Before surgery, you can play an important role. Because skin is not sterile, your skin needs to be as free of germs as possible. You can reduce the number of germs on your skin by washing with CHG (chlorahexidine gluconate) Soap before surgery.  CHG is an antiseptic cleaner which kills germs and bonds with the skin to continue killing germs even after washing.    Oral Hygiene is also important to reduce your  risk of infection.    Remember - BRUSH YOUR TEETH THE MORNING OF SURGERY WITH YOUR REGULAR TOOTHPASTE  Please do not use if you have an allergy to CHG or antibacterial soaps. If your skin becomes reddened/irritated stop using the CHG.  Do not shave (including legs and underarms) for at least 48 hours prior to first CHG shower. It is OK to shave your face.  Please follow these instructions carefully.   1. Shower the NIGHT BEFORE SURGERY and the MORNING OF SURGERY with CHG.   2. If you chose to wash your hair, wash your hair first as usual with your normal shampoo.  3. After you shampoo, rinse your hair and body thoroughly to remove the shampoo.  4. Use CHG as you would any other liquid soap. You can apply CHG directly to the skin and wash gently with a scrungie or a clean washcloth.   5. Apply the CHG Soap to your body ONLY FROM THE NECK DOWN.  Do not use on open wounds or open sores. Avoid contact with your eyes, ears, mouth and genitals (private parts). Wash Face and genitals (private parts)  with your normal soap.  6. Wash thoroughly, paying special attention to the area where your surgery will be performed.  7. Thoroughly rinse your body with warm water from the neck down.  8. DO NOT shower/wash with your normal soap after using and rinsing off the CHG Soap.  9. Pat yourself dry with a CLEAN TOWEL.  10. Wear CLEAN PAJAMAS to bed the night before surgery, wear comfortable clothes the morning of surgery  11. Place CLEAN SHEETS on your bed the night of your first shower and DO NOT SLEEP WITH PETS.  Day of Surgery:  Do not apply any deodorants/lotions.  Please wear clean clothes to the hospital/surgery center.   Remember to brush your teeth WITH YOUR REGULAR TOOTHPASTE.   Do not wear jewelry  Do not wear lotions, powders, or colognes, or deodorant.  Men may shave face and neck.  Do not bring valuables to the hospital.   [facility] is not responsible for any belongings or  valuables.  Contacts, dentures or bridgework may not be worn into surgery.  Leave your suitcase in the car.  After surgery it may be brought to your room.  For patients admitted to the hospital, discharge time will be determined by your treatment team.   Please read over the fact sheets that you were given.

## 2018-03-16 NOTE — Progress Notes (Addendum)
Anesthesia Chart Review:  Case:  [id number] Date/Time:  03/19/18 1245   Procedure:  LEFT SHOULDER ARTHROSCOPY WITH SUBACROMIAL DECOMPRESSION AND DISTAL CLAVICLE RESECTION (Left Shoulder) - 112min   Anesthesia type:  General   Pre-op diagnosis:  left shoulder impingement; left acromioclavicular osteoarthritis   Location:  [facility] OR ROOM 06 / [place] [facility]   Surgeon:  Justice Britain, MD      DISCUSSION: 62 yo male former smoker. Pertinent hx includes CAD (MI 2002, s/p CABG 2002), angina, HTN, DM, hyperlipidemia, stroke (2014, 2015), TIA, AAA (s/p EVAR 2012; s/p repair type II endoleak of AAA 04/12/15 and 03/05/2017), COPD, emphysema, memory loss, GERD, cirrhosis, thrombocytopenia (related to liver disease). S/p R shoulder arthroscopy 07/27/15.  Cardiac clearance by Rosaria Ferries, PA-C per telephone encounter 02/20/2018. Ok to hold Plavix per telephone encounter by Daneen Schick, MD 02/20/2018.  Pt follows with hematology Dr Alen Blew for thrombocytopenia. Pt last saw Dr.Shadad prior to endoleak repair in 2018. At that time it was recommended he get a unit of platelets prior to procedure. However, per Dr. Hazeline Junker note 01/22/2017 "Platelet count of 60,000 should be adequate to sustain most procedures." Pt's platelet count on PAT labs 70k. There is an order from Dr. Susie Cassette office to transfuse 1 unit platelets preop. Given pt's current platelet count and Dr. Hazeline Junker previous note it may not be necessary. Results called to Dr. Susie Cassette office.  LD Plavix and ASA 10/4.  Elevated CBG 315 at PAT appt. Historically poor BG control. Pt understands that if CBG uncontrolled on DOS he may be cancelled. Anticipate he can proceed as planned barring acute status change and DOS BG acceptable.  Addendum 03/16/18: Received call back from [place] at Dr. Susie Cassette office stating that per Dr. Onnie Graham he would still like to proceed with plan for pt to receive 1 unit of platelets preop.  VS: BP (!) 158/83   Pulse (!) 56   Temp 36.7 C (Oral)    Resp 18   Ht 5\' 11"  (1.803 m)   Wt 93.6 kg   SpO2 98%   BMI 28.79 kg/m   PROVIDERS: Shon Baton, MD is PCP  Daneen Schick, MD is Cardiologist  Zola Button, MD is Hematologist  LABS: Labs reviewed: Acceptable for surgery. Elevated CBG c/w pt's A1c 9.2 and historically poor BG control. Thrombocytopenia c/w pt history. Results called to Dr. Susie Cassette office. (all labs ordered are listed, but only abnormal results are displayed)  Labs Reviewed  GLUCOSE, CAPILLARY - Abnormal; Notable for the following components:      Result Value   Glucose-Capillary 315 (*)    All other components within normal limits  CBC - Abnormal; Notable for the following components:   Platelets 70 (*)    All other components within normal limits  COMPREHENSIVE METABOLIC PANEL - Abnormal; Notable for the following components:   Sodium 132 (*)    Glucose, Bld 294 (*)    AST 43 (*)    Total Bilirubin 1.8 (*)    All other components within normal limits  HEMOGLOBIN A1C - Abnormal; Notable for the following components:   Hgb A1c MFr Bld 9.2 (*)    All other components within normal limits  SURGICAL PCR SCREEN  PREPARE PLATELET PHERESIS     IMAGES: CTA abd/pelvis 10/01/2017: IMPRESSION: Postsurgical changes of endovascular repair of abdominal aortic aneurysm and embolization of type 2 endoleak. The excluded aneurysm sac is unchanged in size compared to the prior CT, with no definite CT evidence of persisting endoleak. Given the  degree of streak artifact secondary to the embolization, abdominal ultrasound may be considered as an alternative to CT for further surveillance.  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  Stigmata of cirrhosis and portal hypertension.  Diverticular disease with no evidence of acute diverticulitis.  EKG: 09/09/2017: NSR rate 62  CV: Nuclear stress test 05/03/2013:  1. Mild  matched attenuation involving the inferior wall without associated wall motion abnormality. No definitive scintigraphic evidence of prior infarction or pharmacologically induced ischemia. 2. Normal wall motion. Ejection fraction - 59%.  Cardiac cath 08/11/2008:  - Coronary angiography.  a. Left main coronary: Widely patent.  b. Left anterior descending coronary: Totally occluded in the  midvessel.  c. Circumflex artery: 99% OM #1 but this vessel is grafted.  It supplies collaterals to the distal right coronary.  d. Right coronary: Totally occluded. - Bypass graft angiography.  a. Saphenous vein graft to the diagonal #1: Widely patent.  b. Saphenous vein graft to the OM #1: Diffuse disease with up  to 60% proximal stenosis but patent.  c. Saphenous vein graft to RCA: Totally occluded. - LIMA to LAD: Widely patent   Past Medical History:  Diagnosis Date  . AAA (abdominal aortic aneurysm) ([place])   . Anginal pain ([place])   . Bulging lumbar disc    "& some in my neck"  . C. difficile colitis    2008 OR 09  . CAD (coronary artery disease)   . Change in platelet count    pt states 'has been low since 2012; patient had to receive platelets in surgery in Nov 2016'  . Chronic back pain   . Cirrhosis ([place])   . COPD (chronic obstructive pulmonary disease) ([place])   . Diabetic peripheral neuropathy ([place])   . Emphysema lung ([place])   . Enlarged liver   . Enlarged prostate   . GERD (gastroesophageal reflux disease)   . Hardening of the arteries of the brain   . Headache    "~ weekly" (04/12/2015)  . Heart attack [facility]) Nov. 15,2016   TIA  . History of colon polyps   . Hyperlipidemia   . Hypertension   . Leg pain    with walking and pain in feet while lying flat  . Memory loss    Due to hardening of the arteries in the brain  . Myocardial infarction ([place])    "I've had probably 6-7" (04/12/2015)  . Restless leg  syndrome   . Rheumatic fever    AS A CHILD  ?6 YRS OLD  . RLS (restless legs syndrome)   . Shortness of breath 12/06/10   while lying flat and with exertion  . Snoring   . Spleen enlarged   . Stroke [facility]) 2004   denies residual on 04/12/2015  . Thrombocytopenia ([place])    since 2012; can be as low as 54K.   [name] TIA (transient ischemic attack) "several"   "since 2015" (04/12/2015)  . Type II diabetes mellitus ([place]) dx'd 2009    Past Surgical History:  Procedure Laterality Date  . ABDOMINAL AORTIC ANEURYSM REPAIR  01/16/2011   EVAR  . ABDOMINAL AORTIC ANEURYSM REPAIR  04/12/2015  . CARDIAC CATHETERIZATION  "several"  . COLONOSCOPY    . COLONOSCOPY W/ BIOPSIES    . COLONOSCOPY WITH PROPOFOL N/A 03/21/2016   Procedure: COLONOSCOPY WITH PROPOFOL;  Surgeon: Arta Silence, MD;  Location: [facility] ENDOSCOPY;  Service: Endoscopy;  Laterality: N/A;  .  CORONARY ANGIOPLASTY WITH STENT PLACEMENT  "several"   > 4 sents  . CORONARY ARTERY BYPASS GRAFT  02/18/2001   "CABG x 4"  . Direct sac puncture with liquid embolic repair of Type II endoleak  04/12/2015; 03/05/2017  . ESOPHAGOGASTRODUODENOSCOPY (EGD) WITH PROPOFOL N/A 03/21/2016   Procedure: ESOPHAGOGASTRODUODENOSCOPY (EGD) WITH PROPOFOL;  Surgeon: Arta Silence, MD;  Location: [facility] ENDOSCOPY;  Service: Endoscopy;  Laterality: N/A;  . IR AORTAGRAM ABDOMINAL SERIALOGRAM  03/05/2017  . IR CT SPINE LTD  03/05/2017  . IR EMBO ARTERIAL NOT HEMORR HEMANG INC GUIDE ROADMAPPING  03/05/2017  . IR RADIOLOGIST EVAL & MGMT  02/05/2017  . IR RADIOLOGIST EVAL & MGMT  04/01/2017  . IR RADIOLOGIST EVAL & MGMT  10/01/2017  . PERIPHERAL VASCULAR CATHETERIZATION N/A 03/10/2015   Procedure: Abdominal Aortogram;  Surgeon: Elam Dutch, MD;  Location: [place] CV LAB;  Service: Cardiovascular;  Laterality: N/A;  . RADIOLOGY WITH ANESTHESIA N/A 04/12/2015   Procedure: embolization;  Surgeon: Jacqulynn Cadet, MD;  Location: [place];  Service: Radiology;  Laterality: N/A;  .  RADIOLOGY WITH ANESTHESIA N/A 03/05/2017   Procedure: Type II Endoleak Repair;  Surgeon: Jacqulynn Cadet, MD;  Location: [place];  Service: Radiology;  Laterality: N/A;  . SHOULDER SURGERY Right 07-27-2015  . TONSILLECTOMY  ~ 1965    MEDICATIONS: . albuterol (PROVENTIL HFA;VENTOLIN HFA) 108 (90 BASE) MCG/ACT inhaler  . aspirin EC 81 MG tablet  . Cholecalciferol (VITAMIN D) 2000 units tablet  . clonazePAM (KLONOPIN) 1 MG tablet  . clopidogrel (PLAVIX) 75 MG tablet  . cyanocobalamin (,VITAMIN B-12,) 1000 MCG/ML injection  . empagliflozin (JARDIANCE) 10 MG TABS tablet  . escitalopram (LEXAPRO) 20 MG tablet  . ezetimibe-simvastatin (VYTORIN) 10-40 MG per tablet  . fenofibrate 160 MG tablet  . Fluticasone-Salmeterol (ADVAIR) 100-50 MCG/DOSE AEPB  . furosemide (LASIX) 40 MG tablet  . glimepiride (AMARYL) 4 MG tablet  . lansoprazole (PREVACID) 30 MG capsule  . losartan (COZAAR) 50 MG tablet  . metFORMIN (GLUCOPHAGE) 1000 MG tablet  . metoprolol (LOPRESSOR) 50 MG tablet  . nitroGLYCERIN (NITROSTAT) 0.4 MG SL tablet  . oxyCODONE-acetaminophen (PERCOCET/ROXICET) 5-325 MG tablet  . pramipexole (MIRAPEX) 0.25 MG tablet  . pregabalin (LYRICA) 100 MG capsule  . ranolazine (RANEXA) 1000 MG SR tablet  . terazosin (HYTRIN) 1 MG capsule  . tiotropium (SPIRIVA) 18 MCG inhalation capsule   . 0.9 %  sodium chloride infusion (Manually program via [place])    Wynonia Musty [facility] Short Stay Center/Anesthesiology Phone [phone] 03/16/2018 4:05 PM

## 2018-03-19 ENCOUNTER — Encounter (HOSPITAL_COMMUNITY)
Admission: RE | Disposition: A | Discharge status: Home / Self Care | Payer: Self-pay | Source: Ambulatory Visit | Attending: Orthopedic Surgery

## 2018-03-19 ENCOUNTER — Ambulatory Visit (HOSPITAL_COMMUNITY): Payer: Medicare HMO | Admitting: Physician Assistant

## 2018-03-19 ENCOUNTER — Encounter (HOSPITAL_COMMUNITY): Payer: Self-pay | Admitting: *Deleted

## 2018-03-19 ENCOUNTER — Ambulatory Visit (HOSPITAL_COMMUNITY)
Admission: RE | Admit: 2018-03-19 | Discharge: 2018-03-19 | Disposition: A | Discharge status: Home / Self Care | Payer: Medicare HMO | Source: Ambulatory Visit | Attending: Orthopedic Surgery | Admitting: Orthopedic Surgery

## 2018-03-19 ENCOUNTER — Ambulatory Visit (HOSPITAL_COMMUNITY): Payer: Medicare HMO | Admitting: Anesthesiology

## 2018-03-19 DIAGNOSIS — I252 Old myocardial infarction: Secondary | ICD-10-CM | POA: Insufficient documentation

## 2018-03-19 DIAGNOSIS — M25812 Other specified joint disorders, left shoulder: Secondary | ICD-10-CM | POA: Insufficient documentation

## 2018-03-19 DIAGNOSIS — M19012 Primary osteoarthritis, left shoulder: Secondary | ICD-10-CM | POA: Insufficient documentation

## 2018-03-19 DIAGNOSIS — K219 Gastro-esophageal reflux disease without esophagitis: Secondary | ICD-10-CM | POA: Diagnosis not present

## 2018-03-19 DIAGNOSIS — Z8673 Personal history of transient ischemic attack (TIA), and cerebral infarction without residual deficits: Secondary | ICD-10-CM | POA: Diagnosis not present

## 2018-03-19 DIAGNOSIS — Z7902 Long term (current) use of antithrombotics/antiplatelets: Secondary | ICD-10-CM | POA: Insufficient documentation

## 2018-03-19 DIAGNOSIS — J439 Emphysema, unspecified: Secondary | ICD-10-CM | POA: Insufficient documentation

## 2018-03-19 DIAGNOSIS — Z9981 Dependence on supplemental oxygen: Secondary | ICD-10-CM | POA: Insufficient documentation

## 2018-03-19 DIAGNOSIS — I10 Essential (primary) hypertension: Secondary | ICD-10-CM | POA: Diagnosis not present

## 2018-03-19 DIAGNOSIS — Z79899 Other long term (current) drug therapy: Secondary | ICD-10-CM | POA: Insufficient documentation

## 2018-03-19 DIAGNOSIS — Z87891 Personal history of nicotine dependence: Secondary | ICD-10-CM | POA: Insufficient documentation

## 2018-03-19 DIAGNOSIS — G8918 Other acute postprocedural pain: Secondary | ICD-10-CM | POA: Diagnosis not present

## 2018-03-19 DIAGNOSIS — Z955 Presence of coronary angioplasty implant and graft: Secondary | ICD-10-CM | POA: Insufficient documentation

## 2018-03-19 DIAGNOSIS — I2510 Atherosclerotic heart disease of native coronary artery without angina pectoris: Secondary | ICD-10-CM | POA: Insufficient documentation

## 2018-03-19 DIAGNOSIS — Z951 Presence of aortocoronary bypass graft: Secondary | ICD-10-CM | POA: Insufficient documentation

## 2018-03-19 DIAGNOSIS — E1142 Type 2 diabetes mellitus with diabetic polyneuropathy: Secondary | ICD-10-CM | POA: Insufficient documentation

## 2018-03-19 DIAGNOSIS — Z7951 Long term (current) use of inhaled steroids: Secondary | ICD-10-CM | POA: Insufficient documentation

## 2018-03-19 DIAGNOSIS — E785 Hyperlipidemia, unspecified: Secondary | ICD-10-CM | POA: Insufficient documentation

## 2018-03-19 DIAGNOSIS — M7542 Impingement syndrome of left shoulder: Secondary | ICD-10-CM | POA: Diagnosis not present

## 2018-03-19 DIAGNOSIS — S43432A Superior glenoid labrum lesion of left shoulder, initial encounter: Secondary | ICD-10-CM | POA: Diagnosis not present

## 2018-03-19 DIAGNOSIS — Z7982 Long term (current) use of aspirin: Secondary | ICD-10-CM | POA: Insufficient documentation

## 2018-03-19 DIAGNOSIS — Z7984 Long term (current) use of oral hypoglycemic drugs: Secondary | ICD-10-CM | POA: Insufficient documentation

## 2018-03-19 LAB — GLUCOSE, CAPILLARY
GLUCOSE-CAPILLARY: 218 mg/dL — AB (ref 70–99)
GLUCOSE-CAPILLARY: 178 mg/dL — AB (ref 70–99)
Glucose-Capillary: 172 mg/dL — ABNORMAL HIGH (ref 70–99)

## 2018-03-19 SURGERY — SHOULDER ARTHROSCOPY WITH SUBACROMIAL DECOMPRESSION AND DISTAL CLAVICLE EXCISION
Anesthesia: Regional | Site: Shoulder | Laterality: Left

## 2018-03-19 MED ORDER — FENTANYL CITRATE (PF) 100 MCG/2ML IJ SOLN
INTRAMUSCULAR | Status: DC | PRN
  Administered 2018-03-19: 50 ug via INTRAVENOUS

## 2018-03-19 MED ORDER — ONDANSETRON HCL 4 MG/2ML IJ SOLN: 4.0000 mg | Freq: Once | INTRAMUSCULAR | Status: DC | PRN

## 2018-03-19 MED ORDER — METOPROLOL TARTRATE 50 MG PO TABS
50.0000 mg | ORAL_TABLET | ORAL | Status: AC
  Administered 2018-03-19: 50 mg via ORAL

## 2018-03-19 MED ORDER — SODIUM CHLORIDE 0.9 % IR SOLN
Status: DC | PRN
  Administered 2018-03-19 (×2): 3000 mL

## 2018-03-19 MED ORDER — 0.9 % SODIUM CHLORIDE (POUR BTL) OPTIME
TOPICAL | Status: DC | PRN
  Administered 2018-03-19: 1000 mL

## 2018-03-19 MED ORDER — DEXAMETHASONE SODIUM PHOSPHATE 10 MG/ML IJ SOLN
INTRAMUSCULAR | Status: DC | PRN
  Administered 2018-03-19: 5 mg via INTRAVENOUS

## 2018-03-19 MED ORDER — METOPROLOL TARTRATE 50 MG PO TABS
ORAL_TABLET | ORAL | Status: AC
  Administered 2018-03-19: 50 mg via ORAL
  Filled 2018-03-19: qty 1

## 2018-03-19 MED ORDER — ROCURONIUM BROMIDE 50 MG/5ML IV SOSY
PREFILLED_SYRINGE | INTRAVENOUS | Status: DC | PRN
  Administered 2018-03-19: 40 mg via INTRAVENOUS

## 2018-03-19 MED ORDER — FENTANYL CITRATE (PF) 100 MCG/2ML IJ SOLN: 25.0000 ug | INTRAMUSCULAR | Status: DC | PRN

## 2018-03-19 MED ORDER — CLONIDINE HCL (ANALGESIA) 100 MCG/ML EP SOLN
EPIDURAL | Status: DC | PRN
  Administered 2018-03-19: 100 ug

## 2018-03-19 MED ORDER — ONDANSETRON HCL 4 MG/2ML IJ SOLN
INTRAMUSCULAR | Status: AC
  Filled 2018-03-19: qty 2

## 2018-03-19 MED ORDER — MIDAZOLAM HCL 2 MG/2ML IJ SOLN
1.0000 mg | Freq: Once | INTRAMUSCULAR | Status: AC
  Administered 2018-03-19: 1 mg via INTRAVENOUS

## 2018-03-19 MED ORDER — ROPIVACAINE HCL 7.5 MG/ML IJ SOLN
INTRAMUSCULAR | Status: DC | PRN
  Administered 2018-03-19: 20 mL via PERINEURAL

## 2018-03-19 MED ORDER — MIDAZOLAM HCL 2 MG/2ML IJ SOLN
INTRAMUSCULAR | Status: AC
  Administered 2018-03-19: 1 mg via INTRAVENOUS
  Filled 2018-03-19: qty 2

## 2018-03-19 MED ORDER — METOPROLOL TARTRATE 12.5 MG HALF TABLET
ORAL_TABLET | ORAL | Status: AC
  Filled 2018-03-19: qty 2

## 2018-03-19 MED ORDER — ONDANSETRON HCL 4 MG PO TABS: 4.0000 mg | ORAL_TABLET | Freq: Three times a day (TID) | ORAL | 0 refills | Status: DC | PRN

## 2018-03-19 MED ORDER — LIDOCAINE 2% (20 MG/ML) 5 ML SYRINGE
INTRAMUSCULAR | Status: DC | PRN
  Administered 2018-03-19: 60 mg via INTRAVENOUS

## 2018-03-19 MED ORDER — ONDANSETRON HCL 4 MG/2ML IJ SOLN
INTRAMUSCULAR | Status: DC | PRN
  Administered 2018-03-19: 4 mg via INTRAVENOUS

## 2018-03-19 MED ORDER — PROPOFOL 10 MG/ML IV BOLUS
INTRAVENOUS | Status: DC | PRN
  Administered 2018-03-19: 160 mg via INTRAVENOUS

## 2018-03-19 MED ORDER — CEFAZOLIN SODIUM-DEXTROSE 2-4 GM/100ML-% IV SOLN
2.0000 g | INTRAVENOUS | Status: AC
  Administered 2018-03-19: 2 g via INTRAVENOUS

## 2018-03-19 MED ORDER — METHOCARBAMOL 500 MG PO TABS: 500.0000 mg | ORAL_TABLET | Freq: Three times a day (TID) | ORAL | 1 refills | Status: DC | PRN

## 2018-03-19 MED ORDER — PHENYLEPHRINE 40 MCG/ML (10ML) SYRINGE FOR IV PUSH (FOR BLOOD PRESSURE SUPPORT)
PREFILLED_SYRINGE | INTRAVENOUS | Status: DC | PRN
  Administered 2018-03-19 (×3): 80 ug via INTRAVENOUS

## 2018-03-19 MED ORDER — EPHEDRINE 5 MG/ML INJ
INTRAVENOUS | Status: AC
  Filled 2018-03-19: qty 10

## 2018-03-19 MED ORDER — ROCURONIUM BROMIDE 50 MG/5ML IV SOSY
PREFILLED_SYRINGE | INTRAVENOUS | Status: AC
  Filled 2018-03-19: qty 5

## 2018-03-19 MED ORDER — LACTATED RINGERS IV SOLN
INTRAVENOUS | Status: DC
  Administered 2018-03-19: 12:00:00 via INTRAVENOUS

## 2018-03-19 MED ORDER — ALBUTEROL SULFATE HFA 108 (90 BASE) MCG/ACT IN AERS
INHALATION_SPRAY | RESPIRATORY_TRACT | Status: DC | PRN
  Administered 2018-03-19: 8 via RESPIRATORY_TRACT

## 2018-03-19 MED ORDER — LIDOCAINE 2% (20 MG/ML) 5 ML SYRINGE
INTRAMUSCULAR | Status: AC
  Filled 2018-03-19: qty 5

## 2018-03-19 MED ORDER — SODIUM CHLORIDE 0.9 % IV SOLN
INTRAVENOUS | Status: DC | PRN
  Administered 2018-03-19: 25 ug/min via INTRAVENOUS

## 2018-03-19 MED ORDER — CHLORHEXIDINE GLUCONATE 4 % EX LIQD: 60.0000 mL | Freq: Once | CUTANEOUS | Status: DC

## 2018-03-19 MED ORDER — FENTANYL CITRATE (PF) 100 MCG/2ML IJ SOLN
INTRAMUSCULAR | Status: AC
  Filled 2018-03-19: qty 2

## 2018-03-19 MED ORDER — CEFAZOLIN SODIUM-DEXTROSE 2-4 GM/100ML-% IV SOLN
INTRAVENOUS | Status: AC
  Filled 2018-03-19: qty 100

## 2018-03-19 MED ORDER — DEXAMETHASONE SODIUM PHOSPHATE 10 MG/ML IJ SOLN
INTRAMUSCULAR | Status: AC
  Filled 2018-03-19: qty 1

## 2018-03-19 MED ORDER — SUGAMMADEX SODIUM 200 MG/2ML IV SOLN
INTRAVENOUS | Status: DC | PRN
  Administered 2018-03-19: 200 mg via INTRAVENOUS

## 2018-03-19 MED ORDER — FENTANYL CITRATE (PF) 250 MCG/5ML IJ SOLN
INTRAMUSCULAR | Status: AC
  Filled 2018-03-19: qty 5

## 2018-03-19 MED ORDER — EPHEDRINE SULFATE-NACL 50-0.9 MG/10ML-% IV SOSY
PREFILLED_SYRINGE | INTRAVENOUS | Status: DC | PRN
  Administered 2018-03-19: 10 mg via INTRAVENOUS

## 2018-03-19 MED ORDER — OXYCODONE-ACETAMINOPHEN 5-325 MG PO TABS: 1.0000 | ORAL_TABLET | ORAL | 0 refills | Status: DC | PRN

## 2018-03-19 SURGICAL SUPPLY — 59 items
BLADE CUTTER GATOR 3.5 (BLADE) ×3 IMPLANT
BLADE GREAT WHITE 4.2 (BLADE) ×2 IMPLANT
BLADE GREAT WHITE 4.2MM (BLADE) ×1
BLADE SURG 11 STRL SS (BLADE) ×3 IMPLANT
BOOTCOVER CLEANROOM LRG (PROTECTIVE WEAR) ×6 IMPLANT
BUR OVAL 4.0 (BURR) ×3 IMPLANT
CANISTER SUCT LVC 12 LTR MEDI- (MISCELLANEOUS) ×3 IMPLANT
CANNULA ACUFLEX KIT 5X76 (CANNULA) ×3 IMPLANT
CANNULA DRILOCK 5.0MMX75MM (CANNULA)
CANNULA DRILOCK 5.0X75 (CANNULA) IMPLANT
CLOSURE STERI-STRIP 1/2X4 (GAUZE/BANDAGES/DRESSINGS) ×1
CLOSURE WOUND 1/2 X4 (GAUZE/BANDAGES/DRESSINGS) ×1
CLSR STERI-STRIP ANTIMIC 1/2X4 (GAUZE/BANDAGES/DRESSINGS) ×2 IMPLANT
CONNECTOR 5 IN 1 STRAIGHT STRL (MISCELLANEOUS) ×3 IMPLANT
COVER WAND RF STERILE (DRAPES) ×3 IMPLANT
DRAPE INCISE 23X17 IOBAN STRL (DRAPES)
DRAPE INCISE IOBAN 23X17 STRL (DRAPES) IMPLANT
DRAPE INCISE IOBAN 66X45 STRL (DRAPES) ×3 IMPLANT
DRAPE ORTHO SPLIT 77X108 STRL (DRAPES) ×4
DRAPE STERI 35X30 U-POUCH (DRAPES) IMPLANT
DRAPE SURG 17X11 SM STRL (DRAPES) ×3 IMPLANT
DRAPE SURG ORHT 6 SPLT 77X108 (DRAPES) ×2 IMPLANT
DRAPE U-SHAPE 47X51 STRL (DRAPES) IMPLANT
DRSG PAD ABDOMINAL 8X10 ST (GAUZE/BANDAGES/DRESSINGS) ×6 IMPLANT
DURAPREP 26ML APPLICATOR (WOUND CARE) ×3 IMPLANT
GAUZE SPONGE 4X4 12PLY STRL (GAUZE/BANDAGES/DRESSINGS) ×3 IMPLANT
GAUZE SPONGE 4X4 12PLY STRL LF (GAUZE/BANDAGES/DRESSINGS) ×3 IMPLANT
GLOVE BIO SURGEON STRL SZ7.5 (GLOVE) ×3 IMPLANT
GLOVE BIO SURGEON STRL SZ8 (GLOVE) ×3 IMPLANT
GLOVE EUDERMIC 7 POWDERFREE (GLOVE) ×3 IMPLANT
GLOVE SS BIOGEL STRL SZ 7.5 (GLOVE) ×1 IMPLANT
GLOVE SUPERSENSE BIOGEL SZ 7.5 (GLOVE) ×2
GOWN STRL REUS W/ TWL LRG LVL3 (GOWN DISPOSABLE) ×1 IMPLANT
GOWN STRL REUS W/ TWL XL LVL3 (GOWN DISPOSABLE) ×2 IMPLANT
GOWN STRL REUS W/TWL LRG LVL3 (GOWN DISPOSABLE) ×3
GOWN STRL REUS W/TWL XL LVL3 (GOWN DISPOSABLE) ×6
KIT BASIN OR (CUSTOM PROCEDURE TRAY) ×3 IMPLANT
KIT SHOULDER TRACTION (DRAPES) ×3 IMPLANT
KIT TURNOVER KIT B (KITS) ×3 IMPLANT
MANIFOLD NEPTUNE II (INSTRUMENTS) ×3 IMPLANT
NEEDLE SPNL 18GX3.5 QUINCKE PK (NEEDLE) ×3 IMPLANT
NS IRRIG 1000ML POUR BTL (IV SOLUTION) ×3 IMPLANT
PACK SHOULDER (CUSTOM PROCEDURE TRAY) ×3 IMPLANT
PAD ABD 8X10 STRL (GAUZE/BANDAGES/DRESSINGS) ×6 IMPLANT
PAD ARMBOARD 7.5X6 YLW CONV (MISCELLANEOUS) ×6 IMPLANT
SET ARTHROSCOPY TUBING (MISCELLANEOUS) ×3
SET ARTHROSCOPY TUBING LN (MISCELLANEOUS) ×1 IMPLANT
SLING ARM FOAM STRAP LRG (SOFTGOODS) ×3 IMPLANT
SLING ARM FOAM STRAP MED (SOFTGOODS) IMPLANT
SPONGE LAP 4X18 RFD (DISPOSABLE) IMPLANT
STRIP CLOSURE SKIN 1/2X4 (GAUZE/BANDAGES/DRESSINGS) ×2 IMPLANT
SUT MNCRL AB 3-0 PS2 18 (SUTURE) ×3 IMPLANT
SUT PDS AB 0 CT 36 (SUTURE) IMPLANT
SYR 20CC LL (SYRINGE) IMPLANT
TAPE PAPER 3X10 WHT MICROPORE (GAUZE/BANDAGES/DRESSINGS) ×3 IMPLANT
TOWEL OR 17X24 6PK STRL BLUE (TOWEL DISPOSABLE) ×3 IMPLANT
TOWEL OR 17X26 10 PK STRL BLUE (TOWEL DISPOSABLE) ×3 IMPLANT
WAND SUCTION MAX 4MM 90S (SURGICAL WAND) ×3 IMPLANT
WATER STERILE IRR 1000ML POUR (IV SOLUTION) ×3 IMPLANT

## 2018-03-19 NOTE — Anesthesia Procedure Notes (Signed)
Procedure Name: Intubation Date/Time: 03/19/2018 12:46 PM Performed by: Candis Shine, CRNA Pre-anesthesia Checklist: Patient identified, Emergency Drugs available, Suction available and Patient being monitored Patient Re-evaluated:Patient Re-evaluated prior to induction Oxygen Delivery Method: Circle System Utilized Preoxygenation: Pre-oxygenation with 100% oxygen Induction Type: IV induction Ventilation: Mask ventilation without difficulty and Oral airway inserted - appropriate to patient size Laryngoscope Size: Mac and 4 Grade View: Grade I Tube type: Oral Tube size: 7.5 mm Number of attempts: 1 Airway Equipment and Method: Stylet and Oral airway Placement Confirmation: ETT inserted through vocal cords under direct vision,  positive ETCO2 and breath sounds checked- equal and bilateral Secured at: 23 cm Tube secured with: Tape Dental Injury: Teeth and Oropharynx as per pre-operative assessment

## 2018-03-19 NOTE — Transfer of Care (Signed)
Immediate Anesthesia Transfer of Care Note  Patient: Christian Mccall  Procedure(s) Performed: LEFT SHOULDER ARTHROSCOPY WITH SUBACROMIAL DECOMPRESSION AND DISTAL CLAVICLE RESECTION (Left Shoulder)  Patient Location: PACU  Anesthesia Type:GA combined with regional for post-op pain  Level of Consciousness: awake, alert  and oriented  Airway & Oxygen Therapy: Patient Spontanous Breathing and Patient connected to face mask oxygen  Post-op Assessment: Report given to RN and Post -op Vital signs reviewed and stable  Post vital signs: Reviewed and stable  Last Vitals:  Vitals Value Taken Time  BP 114/65 03/19/2018  2:04 PM  Temp    Pulse 84 03/19/2018  2:05 PM  Resp 18 03/19/2018  2:05 PM  SpO2 97 % 03/19/2018  2:05 PM  Vitals shown include unvalidated device data.  Last Pain:  Vitals:   03/19/18 1155  TempSrc:   PainSc: 0-No pain         Complications: No apparent anesthesia complications

## 2018-03-19 NOTE — H&P (Signed)
Christian Mccall    Chief Complaint: left shoulder impingement; left acromioclavicular osteoarthritis HPI: The patient is a 62 y.o. male with chronic right shoulder impingement syndrome and symptomatic AC joint OA and symtoms refractory to prolonged attempts at conservative management.  Past Medical History:  Diagnosis Date  . AAA (abdominal aortic aneurysm) ([place])   . Anginal pain ([place])   . Bulging lumbar disc    "& some in my neck"  . C. difficile colitis    2008 OR 09  . CAD (coronary artery disease)   . Change in platelet count    pt states 'has been low since 2012; patient had to receive platelets in surgery in Nov 2016'  . Chronic back pain   . Cirrhosis ([place])   . COPD (chronic obstructive pulmonary disease) ([place])   . Diabetic peripheral neuropathy ([place])   . Emphysema lung ([place])   . Enlarged liver   . Enlarged prostate   . GERD (gastroesophageal reflux disease)   . Hardening of the arteries of the brain   . Headache    "~ weekly" (04/12/2015)  . Heart attack [facility]) Nov. 15,2016   TIA  . History of colon polyps   . Hyperlipidemia   . Hypertension   . Leg pain    with walking and pain in feet while lying flat  . Memory loss    Due to hardening of the arteries in the brain  . Myocardial infarction ([place])    "I've had probably 6-7" (04/12/2015)  . Restless leg syndrome   . Rheumatic fever    AS A CHILD  ?6 YRS OLD  . RLS (restless legs syndrome)   . Shortness of breath 12/06/10   while lying flat and with exertion  . Snoring   . Spleen enlarged   . Stroke [facility]) 2004   denies residual on 04/12/2015  . Thrombocytopenia ([place])    since 2012; can be as low as 54K.   [name] TIA (transient ischemic attack) "several"   "since 2015" (04/12/2015)  . Type II diabetes mellitus ([place]) dx'd 2009    Past Surgical History:  Procedure Laterality Date  . ABDOMINAL AORTIC ANEURYSM REPAIR  01/16/2011   EVAR  . ABDOMINAL AORTIC ANEURYSM REPAIR  04/12/2015  . CARDIAC CATHETERIZATION  "several"   . COLONOSCOPY    . COLONOSCOPY W/ BIOPSIES    . COLONOSCOPY WITH PROPOFOL N/A 03/21/2016   Procedure: COLONOSCOPY WITH PROPOFOL;  Surgeon: Arta Silence, MD;  Location: [facility] ENDOSCOPY;  Service: Endoscopy;  Laterality: N/A;  . CORONARY ANGIOPLASTY WITH STENT PLACEMENT  "several"   > 4 sents  . CORONARY ARTERY BYPASS GRAFT  02/18/2001   "CABG x 4"  . Direct sac puncture with liquid embolic repair of Type II endoleak  04/12/2015; 03/05/2017  . ESOPHAGOGASTRODUODENOSCOPY (EGD) WITH PROPOFOL N/A 03/21/2016   Procedure: ESOPHAGOGASTRODUODENOSCOPY (EGD) WITH PROPOFOL;  Surgeon: Arta Silence, MD;  Location: [facility] ENDOSCOPY;  Service: Endoscopy;  Laterality: N/A;  . IR AORTAGRAM ABDOMINAL SERIALOGRAM  03/05/2017  . IR CT SPINE LTD  03/05/2017  . IR EMBO ARTERIAL NOT HEMORR HEMANG INC GUIDE ROADMAPPING  03/05/2017  . IR RADIOLOGIST EVAL & MGMT  02/05/2017  . IR RADIOLOGIST EVAL & MGMT  04/01/2017  . IR RADIOLOGIST EVAL & MGMT  10/01/2017  . PERIPHERAL VASCULAR CATHETERIZATION N/A 03/10/2015   Procedure: Abdominal Aortogram;  Surgeon: Elam Dutch, MD;  Location: [place] CV LAB;  Service: Cardiovascular;  Laterality: N/A;  . RADIOLOGY WITH ANESTHESIA N/A 04/12/2015  Procedure: embolization;  Surgeon: Jacqulynn Cadet, MD;  Location: [place];  Service: Radiology;  Laterality: N/A;  . RADIOLOGY WITH ANESTHESIA N/A 03/05/2017   Procedure: Type II Endoleak Repair;  Surgeon: Jacqulynn Cadet, MD;  Location: [place];  Service: Radiology;  Laterality: N/A;  . SHOULDER SURGERY Right 07-27-2015  . TONSILLECTOMY  ~ 1965    Family History  Problem Relation Age of Onset  . Aneurysm Mother        AAA  . Heart disease Mother        Heart Disease before age 42  . Other Mother        Carotid artery stenosis  . Hyperlipidemia Mother   . Hypertension Mother   . Lupus Sister   . Cancer Sister        Ovarian  . Heart disease Sister   . Heart disease Brother        Heart Disease before age 26  . Hypertension  Brother   . Heart attack Brother   . Heart disease Brother        Before age 82  . Heart attack Brother   . Heart attack Brother   . Hyperlipidemia Father   . Hypertension Father   . Aneurysm Maternal Aunt        AAA  . Other Maternal Aunt        AAA  . Aneurysm Maternal Uncle        AAA  . Other Maternal Uncle        AAA    Social History:  reports that he quit smoking about 17 years ago. His smoking use included cigarettes. He smoked 0.00 packs per day for 30.00 years. He has never used smokeless tobacco. He reports that he drinks about 1.0 standard drinks of alcohol per week. He reports that he does not use drugs.   Medications Prior to Admission  Medication Sig Dispense Refill  . albuterol (PROVENTIL HFA;VENTOLIN HFA) 108 (90 BASE) MCG/ACT inhaler Inhale 2 puffs into the lungs daily as needed for wheezing or shortness of breath.    [name] aspirin EC 81 MG tablet Take 1 tablet (81 mg total) by mouth daily.    . Cholecalciferol (VITAMIN D) 2000 units tablet Take 2,000 Units by mouth 2 (two) times daily.     . clonazePAM (KLONOPIN) 1 MG tablet Take 1 mg by mouth 2 (two) times daily.     . clopidogrel (PLAVIX) 75 MG tablet Take 1 tablet (75 mg total) by mouth daily.    . empagliflozin (JARDIANCE) 10 MG TABS tablet Take 10 mg by mouth daily.    [name] escitalopram (LEXAPRO) 20 MG tablet Take 20 mg by mouth at bedtime.    [name] ezetimibe-simvastatin (VYTORIN) 10-40 MG per tablet Take 1 tablet by mouth at bedtime.     . fenofibrate 160 MG tablet Take 160 mg by mouth daily.     . Fluticasone-Salmeterol (ADVAIR) 100-50 MCG/DOSE AEPB Inhale 1 puff into the lungs 2 (two) times daily.     . furosemide (LASIX) 40 MG tablet Take 40 mg by mouth every other day.     [name] glimepiride (AMARYL) 4 MG tablet Take 4 mg by mouth every evening.     . lansoprazole (PREVACID) 30 MG capsule Take 30 mg by mouth daily.   3  . losartan (COZAAR) 50 MG tablet Take 50 mg by mouth daily.      . metFORMIN (GLUCOPHAGE) 1000 MG  tablet Take 1,000 mg by mouth 2 (  two) times daily. Breakfast and at bedtime    . metoprolol (LOPRESSOR) 50 MG tablet Take 50 mg by mouth 2 (two) times daily.     . nitroGLYCERIN (NITROSTAT) 0.4 MG SL tablet Place 0.4 mg under the tongue every 5 (five) minutes as needed for chest pain.    [name] oxyCODONE-acetaminophen (PERCOCET/ROXICET) 5-325 MG tablet Take 1 tablet by mouth every 6 (six) hours as needed for moderate pain. 15 tablet 0  . pramipexole (MIRAPEX) 0.25 MG tablet Take 0.25 mg by mouth at bedtime.     . pregabalin (LYRICA) 100 MG capsule Take 100-200 mg by mouth See admin instructions. Take 1 tablet (100 mg) by mouth every morning and 2 tablets (200 mg) at bedtime    . ranolazine (RANEXA) 1000 MG SR tablet Take 1,000 mg by mouth 2 (two) times daily.      [name] terazosin (HYTRIN) 1 MG capsule Take 1 mg by mouth at bedtime.     [name] tiotropium (SPIRIVA) 18 MCG inhalation capsule Place 18 mcg into inhaler and inhale daily.     . cyanocobalamin (,VITAMIN B-12,) 1000 MCG/ML injection Inject 1,000 mcg into the muscle See admin instructions. Every 30 to 60 days       Physical Exam: Left shoulder with painful and restricted motion as noted in previous office visits.  Vitals  Temp:  [97.6 F (36.4 C)-98.3 F (36.8 C)] 97.6 F (36.4 C) (10/10 1136) Pulse Rate:  [66-77] 77 (10/10 1136) Resp:  [18] 18 (10/10 1130) BP: (106-126)/(46-58) 119/52 (10/10 1136) SpO2:  [94 %-98 %] 96 % (10/10 1136) Weight:  [93 kg] 93 kg (10/10 1041)  Assessment/Plan  Impression: left shoulder impingement; left acromioclavicular osteoarthritis  Plan of Action: Procedure(s): LEFT SHOULDER ARTHROSCOPY WITH SUBACROMIAL DECOMPRESSION AND DISTAL CLAVICLE RESECTION  Izeyah Deike M Jodene Polyak 03/19/2018, 11:54 AM Contact # [phone]

## 2018-03-19 NOTE — Anesthesia Postprocedure Evaluation (Signed)
Anesthesia Post Note  Patient: Christian Mccall  Procedure(s) Performed: LEFT SHOULDER ARTHROSCOPY WITH SUBACROMIAL DECOMPRESSION AND DISTAL CLAVICLE RESECTION (Left Shoulder)     Patient location during evaluation: PACU Anesthesia Type: Regional and General Level of consciousness: awake and alert Pain management: pain level controlled Vital Signs Assessment: post-procedure vital signs reviewed and stable Respiratory status: spontaneous breathing, nonlabored ventilation, respiratory function stable and patient connected to nasal cannula oxygen Cardiovascular status: blood pressure returned to baseline and stable Postop Assessment: no apparent nausea or vomiting Anesthetic complications: no    Last Vitals:  Vitals:   03/19/18 1449 03/19/18 1516  BP: 103/68 105/63  Pulse: 80 78  Resp: 11   Temp: (!) 36.3 C   SpO2: 94% 92%    Last Pain:  Vitals:   03/19/18 1516  TempSrc:   PainSc: 0-No pain                 Ryan P Ellender

## 2018-03-19 NOTE — Op Note (Signed)
03/19/2018  1:48 PM  PATIENT:   Christian Mccall  62 y.o. male  PRE-OPERATIVE DIAGNOSIS:  left shoulder impingement; left acromioclavicular osteoarthritis  POST-OPERATIVE DIAGNOSIS: Same  PROCEDURE:  #1  Left shoulder examination anesthesia  2.  Left shoulder glenohumeral joint diagnostic arthroscopy  3.  Debridement degenerative labral tear  4.  Left shoulder arthroscopic subacromial decompression and bursectomy  5.  Left shoulder arthroscopic distal clavicle resection  SURGEON:  Shaelee Forni, Metta Clines. M.D.  ASSISTANTS: Jenetta Loges, PA-C  ANESTHESIA:   General endotracheal as well as interscalene block  EBL: Minimal  SPECIMEN: None  Drains: None   PATIENT DISPOSITION:  PACU - hemodynamically stable.    PLAN OF CARE: Discharge to home after PACU  Brief history:  Christian Mccall is been followed for chronic left shoulder pain related to an impingement syndrome and symptomatic AC joint arthritis which has been refractory to prolonged attempts at conservative management.  Due to was ongoing and progressively increasing pain as well as associated functional limitations he is brought to the operating this time for planned left shoulder arthroscopy as described below  Preoperatively and counseled Christian Mccall regarding treatment options as well as the potential risks versus benefits thereof.  Possible surgical complications were reviewed including potential bleeding, infection, neurovascular injury, persistent pain, anesthetic complication, and possible need for additional surgery.  He understands and accepts and agrees with her planned procedure.  Procedure in detail:  After undergoing routine preop evaluation patient received prophylactic antibiotics and interscalene block was established in the holding area by the anesthesia department.  Placed supine on the operative table underwent smooth induction of a general endotracheal anesthesia.  Turned to the right lateral decubitus position  on the beanbag and appropriately padded and protected.  Left shoulder examination under anesthesia revealed full motion but with some mild tightness in range.  Left arm was then suspended at 70 degrees of abduction with 10 pounds of traction in the left shoulder region was sterilely prepped and draped in standard fashion.  Timeout was called.  Posterior portal established in the glenohumeral joint intraportal established under direct visualization.  The articular surfaces are all found to be in good condition.  There is degenerative tearing of the superior labrum which was debrided with a shaver back to stable margin.  The biceps tendon was of normal caliber with no evidence for proximal or distal instability.  Rotator cuff was carefully inspected and found to be intact and pristine throughout.  Capsular volume was within normal limits.  No significant additional pathologies were identified within the glenohumeral joint.  Fluid and instruments were then removed.  The arms it down to 30 degrees of abduction with arthroscope introduced in the subacromial space to the posterior portal and a direct lateral portal was established in the subacromial space.  Abundant dense bursal tissue multiple adhesions were encountered and these were all divided and excised, and the shaver and the RF wand.  The periosteum was removed from the undersurface of the anterior half of the acromion and a subacromial decompression was performed with a bur creating a type I morphology.  A portal was then 7 strictly anterior to the distal clavicle and distal clavicle resection was performed with a bur.  Care was taken to confirm visualization of the entire circumference of the distal clavicle to ensure adequate removal of bone.  We then completed the subacromial subdeltoid bursectomy.  Bursal surface of the rotator cuff showed superficial fraying on the distal aspect of the supraspinatus and  this was debrided with shaver but was certainly less  than 5 to 10% of the thickness of the tendon.  This point final inspection irrigation was then completed.  Hemostasis was obtained.  Fluid is removed.  The portals were closed with Monocryl and Steri-Strip a dry dressing taped about the left shoulder.  Left arm was then placed in a sling and the patient was awakened extubated taken condition.  Jenetta Loges, PA-C was used as an Environmental consultant throughout this case essential for help with positioning the patient, position extremity, tissue manipulation, wound closure, and intraoperative decision-making.  Marin Shutter MD     Contact # [phone]

## 2018-03-19 NOTE — Anesthesia Procedure Notes (Signed)
Anesthesia Regional Block: Interscalene brachial plexus block   Pre-Anesthetic Checklist: ,, timeout performed, Correct Patient, Correct Site, Correct Laterality, Correct Procedure, Correct Position, site marked, Risks and benefits discussed,  Surgical consent,  Pre-op evaluation,  At surgeon's request and post-op pain management  Laterality: Left  Prep: chloraprep       Needles:  Injection technique: Single-shot  Needle Type: Echogenic Stimulator Needle     Needle Length: 9cm  Needle Gauge: 21     Additional Needles:   Procedures:,,,, ultrasound used (permanent image in chart),,,,  Narrative:  Start time: 03/19/2018 12:05 PM End time: 03/19/2018 12:15 PM Injection made incrementally with aspirations every 5 mL.  Performed by: Personally  Anesthesiologist: Murvin Natal, MD  Additional Notes: Functioning IV was confirmed and monitors were applied.  A timeout was performed. Sterile prep, hand hygiene and sterile gloves were used. A 69mm 21ga Arrow echogenic stimulator needle was used. Negative aspiration and negative test dose prior to incremental administration of local anesthetic. The patient tolerated the procedure well.  Ultrasound guidance: relevent anatomy identified, needle position confirmed, local anesthetic spread visualized around nerve(s), vascular puncture avoided.  Image printed for medical record.

## 2018-03-19 NOTE — Anesthesia Preprocedure Evaluation (Addendum)
Anesthesia Evaluation  Patient identified by MRN, date of birth, ID band Patient awake    Reviewed: Allergy & Precautions, NPO status , Patient's Chart, lab work & pertinent test results, reviewed documented beta blocker date and time   Airway Mallampati: III  TM Distance: >3 FB Neck ROM: Full    Dental  (+) Upper Dentures, Lower Dentures   Pulmonary COPD (2 L O@ at night),  COPD inhaler and oxygen dependent, former smoker,    Pulmonary exam normal breath sounds clear to auscultation       Cardiovascular hypertension, Pt. on medications and Pt. on home beta blockers + angina + CAD, + Past MI and + CABG (in 2002)  Normal cardiovascular exam Rhythm:Regular Rate:Normal  ECG: NSR, rate 62  Cardiac clearance by Rosaria Ferries, PA-C per telephone encounter 02/20/2018   Neuro/Psych  Headaches, TIACVA, No Residual Symptoms negative psych ROS   GI/Hepatic GERD  Controlled and Medicated,(+) Cirrhosis       ,   Endo/Other  diabetes, Oral Hypoglycemic Agents  Renal/GU negative Renal ROS     Musculoskeletal  (+) Arthritis , Chronic back pain   Abdominal   Peds  Hematology negative hematology ROS (+) Thrombocytopenia    Anesthesia Other Findings left shoulder impingement left acromioclavicular osteoarthritis  Reproductive/Obstetrics                            Anesthesia Physical Anesthesia Plan  ASA: IV  Anesthesia Plan: General and Regional   Post-op Pain Management: GA combined w/ Regional for post-op pain   Induction: Intravenous  PONV Risk Score and Plan: 2 and Ondansetron, Dexamethasone, Midazolam and Treatment may vary due to age or medical condition  Airway Management Planned: Oral ETT  Additional Equipment:   Intra-op Plan:   Post-operative Plan: Extubation in OR  Informed Consent: I have reviewed the patients History and Physical, chart, labs and discussed the procedure including  the risks, benefits and alternatives for the proposed anesthesia with the patient or authorized representative who has indicated his/her understanding and acceptance.   Dental advisory given  Plan Discussed with: CRNA  Anesthesia Plan Comments:         Anesthesia Quick Evaluation

## 2018-03-19 NOTE — Discharge Instructions (Signed)
° °  Metta Clines. Supple, M.D., F.A.A.O.S. Orthopaedic Surgery Specializing in Arthroscopic and Reconstructive Surgery of the Shoulder and Knee [phone] [address] [place], [place] [address] - Fax [phone]   POST-OP SHOULDER ARTHROSCOPY INSTRUCTIONS  1. Call the office at [phone] to schedule your first post-op appointment 7-10 days from the date of your surgery.  2. Leave the steri-strips in place over your incisions when performing dressing changes and showering. You may remove your dressings and begin showering 72 hours from surgery. You can expect drainage that is clear to bloody in nature that occasionally will soak through your dressings. If this occurs go ahead and perform a dressing change. The drainage should lessen daily and when there is no drainage from your incisions feel free to go without a dressing.  3. Wear your sling for comfort. You may come out of your sling for ad lib activity and even decide not to use the sling at all. If you find you are more comfortable in your sling, make sure you come out of your sling at least 3-4 times a day to do the exercises that are included below.  4. Range of motion to your elbow, wrist, and hand are encouraged 3-5 times daily. Exercise to your hand and fingers helps to reduce swelling you may experience.  5. Utilize ice to the shoulder 3-4 times minimum a day and additionally if you are experiencing pain.  6. You may drive when safely off narcotics and muscle relaxants.  7. If you had a block pre-operatively to provide post-op pain relief you may want to go ahead and begin utilizing your pain meds as your arm begins to wake up. Blocks can sometimes last up to 16-18 hours. If you are still pain-free prior to going to bed you may want to strongly consider taking a pain medication to avoid being awakened in the night with the onset of pain. A muscle relaxant is also provided for you should you experience muscle spasms. It  is recommended that if you are experiencing pain that your pain medication alone is not controlling, add the muscle relaxant along with the pain medication which can give additional pain relief. The first one to two days is generally the most severe of your pain and then should gradually decrease. As your pain lessens it is recommended that you decrease your use of the pain medications to an "as needed basis" only and to always comply with the recommended dosages of the pain medications.  8. Pain medications can produce constipation along with their use. If you experience this, the use of an over the counter stool softener or laxative daily is recommended.   9. For additional questions or concerns, please do not hesitate to call the office. If after hours there is an answering service to forward your concerns to the physician on call.   POST-OP EXERCISES  The pendulum exercises should be performed while bending at the waist as far over as possible thereby letting gravity do the work for you.  Range of Motion Exercises: Pendulum (circular)  Repeat 20 times. Do 3 sessions per day.     Range of Motion Exercises: Pendulum (side-to-side)  Repeat 20 times. Do 3 sessions per day.    Range of Motion Exercises (self-stretching activities):  Slide arm up wall with palm toward you, moving closer to the wall. Hold for 5 seconds.  Repeat 10 times. Do 3 sessions per day.

## 2018-03-20 LAB — PREPARE PLATELET PHERESIS: UNIT DIVISION: 0

## 2018-03-20 LAB — BPAM PLATELET PHERESIS
Blood Product Expiration Date: 201910102359
ISSUE DATE / TIME: 201910101056
Unit Type and Rh: 5100

## 2018-03-27 DIAGNOSIS — M25512 Pain in left shoulder: Secondary | ICD-10-CM | POA: Diagnosis not present

## 2018-04-06 DIAGNOSIS — E119 Type 2 diabetes mellitus without complications: Secondary | ICD-10-CM | POA: Diagnosis not present

## 2018-04-06 DIAGNOSIS — H40013 Open angle with borderline findings, low risk, bilateral: Secondary | ICD-10-CM | POA: Diagnosis not present

## 2018-04-06 DIAGNOSIS — H2513 Age-related nuclear cataract, bilateral: Secondary | ICD-10-CM | POA: Diagnosis not present

## 2018-04-06 DIAGNOSIS — H25013 Cortical age-related cataract, bilateral: Secondary | ICD-10-CM | POA: Diagnosis not present

## 2018-04-09 DIAGNOSIS — J449 Chronic obstructive pulmonary disease, unspecified: Secondary | ICD-10-CM | POA: Diagnosis not present

## 2018-04-15 ENCOUNTER — Other Ambulatory Visit: Payer: Self-pay | Admitting: Gastroenterology

## 2018-04-15 DIAGNOSIS — K7469 Other cirrhosis of liver: Secondary | ICD-10-CM

## 2018-04-17 DIAGNOSIS — E7849 Other hyperlipidemia: Secondary | ICD-10-CM | POA: Diagnosis not present

## 2018-04-17 DIAGNOSIS — R82998 Other abnormal findings in urine: Secondary | ICD-10-CM | POA: Diagnosis not present

## 2018-04-17 DIAGNOSIS — D519 Vitamin B12 deficiency anemia, unspecified: Secondary | ICD-10-CM | POA: Diagnosis not present

## 2018-04-17 DIAGNOSIS — E559 Vitamin D deficiency, unspecified: Secondary | ICD-10-CM | POA: Diagnosis not present

## 2018-04-17 DIAGNOSIS — I10 Essential (primary) hypertension: Secondary | ICD-10-CM | POA: Diagnosis not present

## 2018-04-17 DIAGNOSIS — Z125 Encounter for screening for malignant neoplasm of prostate: Secondary | ICD-10-CM | POA: Diagnosis not present

## 2018-04-17 DIAGNOSIS — E1151 Type 2 diabetes mellitus with diabetic peripheral angiopathy without gangrene: Secondary | ICD-10-CM | POA: Diagnosis not present

## 2018-04-20 DIAGNOSIS — H5203 Hypermetropia, bilateral: Secondary | ICD-10-CM | POA: Diagnosis not present

## 2018-04-20 DIAGNOSIS — H52209 Unspecified astigmatism, unspecified eye: Secondary | ICD-10-CM | POA: Diagnosis not present

## 2018-04-20 DIAGNOSIS — H524 Presbyopia: Secondary | ICD-10-CM | POA: Diagnosis not present

## 2018-04-21 ENCOUNTER — Ambulatory Visit
Admission: RE | Admit: 2018-04-21 | Discharge: 2018-04-21 | Disposition: A | Discharge status: Home / Self Care | Payer: Medicare HMO | Source: Ambulatory Visit | Attending: Gastroenterology | Admitting: Gastroenterology

## 2018-04-21 DIAGNOSIS — K7460 Unspecified cirrhosis of liver: Secondary | ICD-10-CM | POA: Diagnosis not present

## 2018-04-21 DIAGNOSIS — K7469 Other cirrhosis of liver: Secondary | ICD-10-CM

## 2018-04-23 DIAGNOSIS — F329 Major depressive disorder, single episode, unspecified: Secondary | ICD-10-CM | POA: Diagnosis not present

## 2018-04-23 DIAGNOSIS — E1151 Type 2 diabetes mellitus with diabetic peripheral angiopathy without gangrene: Secondary | ICD-10-CM | POA: Diagnosis not present

## 2018-04-23 DIAGNOSIS — Z0000 Encounter for general adult medical examination without abnormal findings: Secondary | ICD-10-CM | POA: Diagnosis not present

## 2018-04-23 DIAGNOSIS — R0902 Hypoxemia: Secondary | ICD-10-CM | POA: Diagnosis not present

## 2018-04-23 DIAGNOSIS — E1140 Type 2 diabetes mellitus with diabetic neuropathy, unspecified: Secondary | ICD-10-CM | POA: Diagnosis not present

## 2018-04-23 DIAGNOSIS — I119 Hypertensive heart disease without heart failure: Secondary | ICD-10-CM | POA: Diagnosis not present

## 2018-04-23 DIAGNOSIS — D519 Vitamin B12 deficiency anemia, unspecified: Secondary | ICD-10-CM | POA: Diagnosis not present

## 2018-04-23 DIAGNOSIS — Z23 Encounter for immunization: Secondary | ICD-10-CM | POA: Diagnosis not present

## 2018-04-23 DIAGNOSIS — D692 Other nonthrombocytopenic purpura: Secondary | ICD-10-CM | POA: Diagnosis not present

## 2018-04-23 DIAGNOSIS — I739 Peripheral vascular disease, unspecified: Secondary | ICD-10-CM | POA: Diagnosis not present

## 2018-04-24 DIAGNOSIS — Z1212 Encounter for screening for malignant neoplasm of rectum: Secondary | ICD-10-CM | POA: Diagnosis not present

## 2018-04-24 DIAGNOSIS — Z4789 Encounter for other orthopedic aftercare: Secondary | ICD-10-CM | POA: Diagnosis not present

## 2018-04-24 DIAGNOSIS — M25512 Pain in left shoulder: Secondary | ICD-10-CM | POA: Diagnosis not present

## 2018-05-09 DIAGNOSIS — J449 Chronic obstructive pulmonary disease, unspecified: Secondary | ICD-10-CM | POA: Diagnosis not present

## 2018-05-25 DIAGNOSIS — M7502 Adhesive capsulitis of left shoulder: Secondary | ICD-10-CM | POA: Diagnosis not present

## 2018-05-25 DIAGNOSIS — Z5189 Encounter for other specified aftercare: Secondary | ICD-10-CM | POA: Diagnosis not present

## 2018-06-09 DIAGNOSIS — J449 Chronic obstructive pulmonary disease, unspecified: Secondary | ICD-10-CM | POA: Diagnosis not present

## 2018-06-11 DIAGNOSIS — R05 Cough: Secondary | ICD-10-CM | POA: Diagnosis not present

## 2018-06-11 DIAGNOSIS — J0140 Acute pansinusitis, unspecified: Secondary | ICD-10-CM | POA: Diagnosis not present

## 2018-06-23 IMAGING — US US ABDOMEN COMPLETE
1 series · 13 of 25 positions shown · non-contrast
Comparison: CT abdomen and pelvis February 04, 2017

CLINICAL DATA: Hepatic cirrhosis

EXAM:
ABDOMEN ULTRASOUND COMPLETE

[Series 1: us abdomen complete · 0.23mm/px · 13 of 94 slices shown]
[im 1/94]
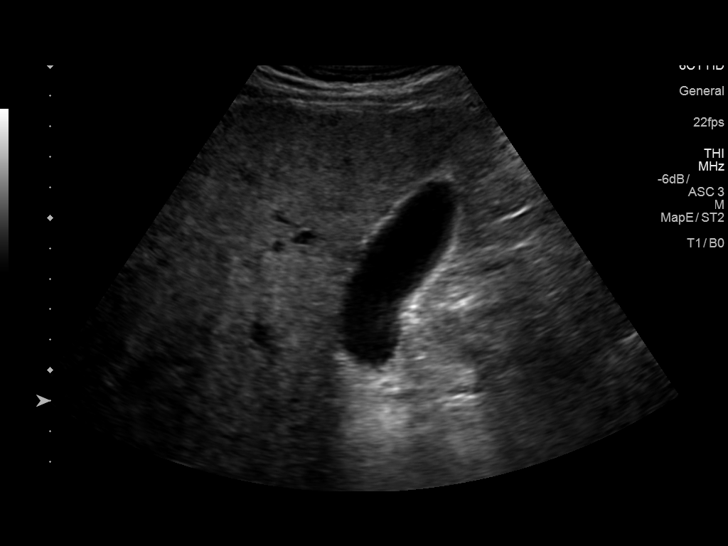
[im 8/94]
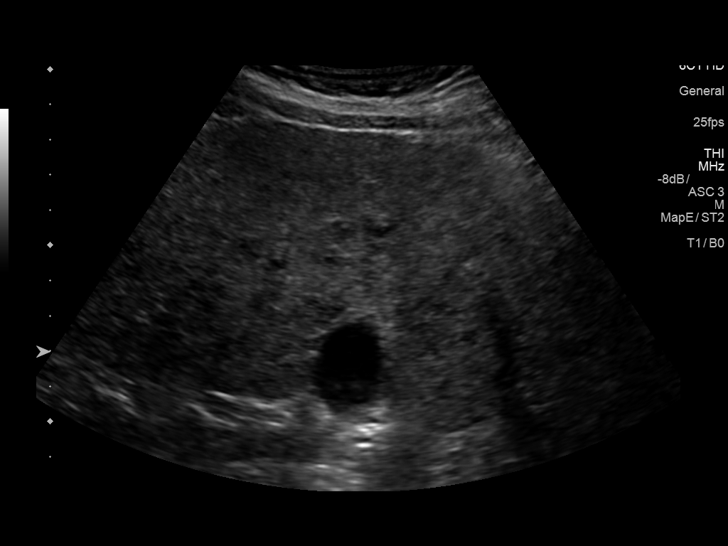
[im 16/94]
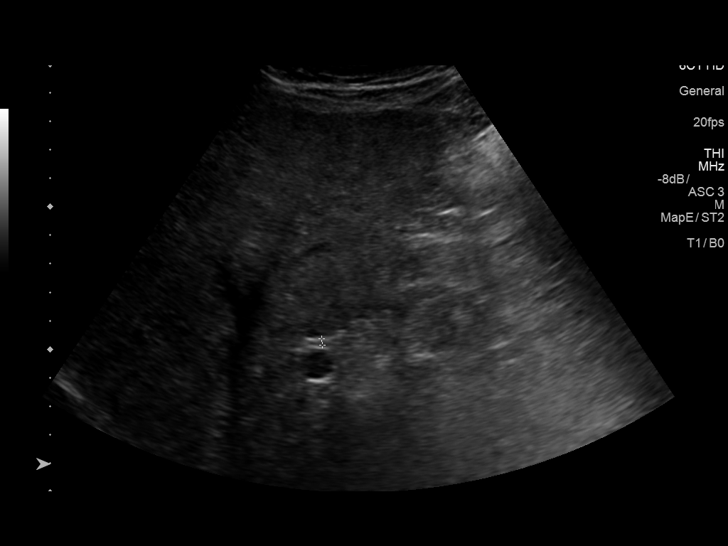
[im 24/94]
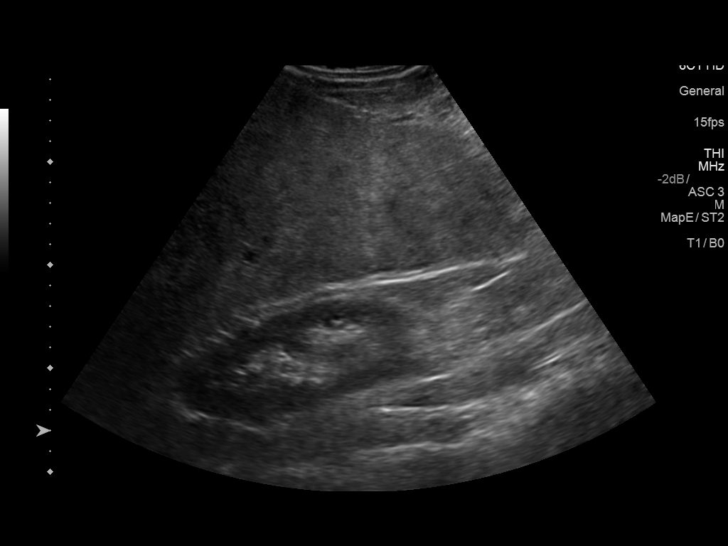
[im 32/94]
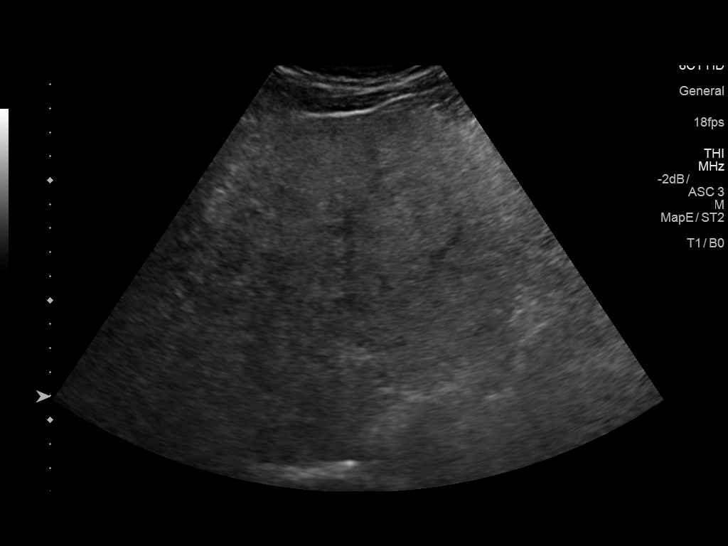
[im 39/94]
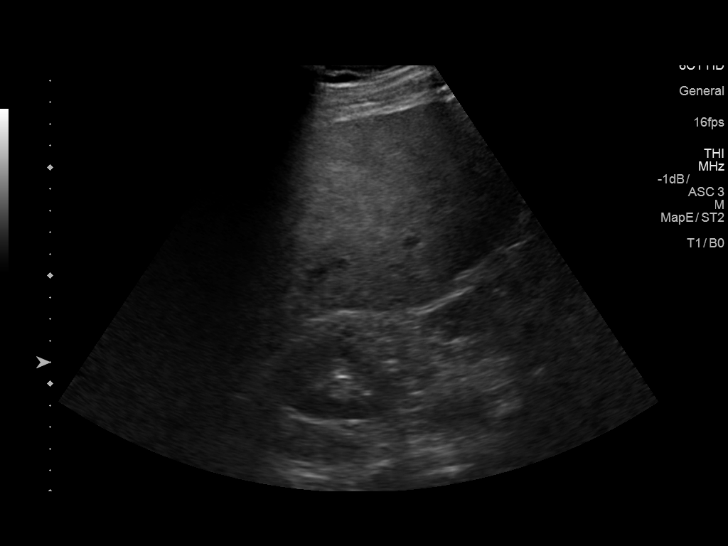
[im 47/94]
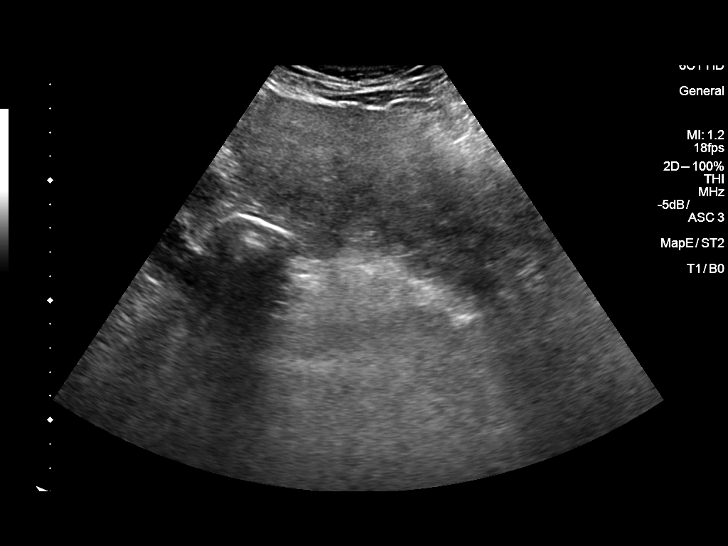
[im 55/94]
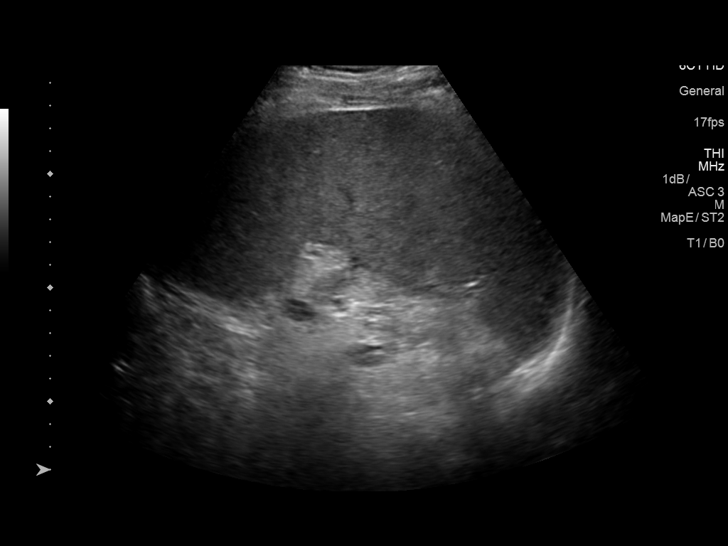
[im 63/94]
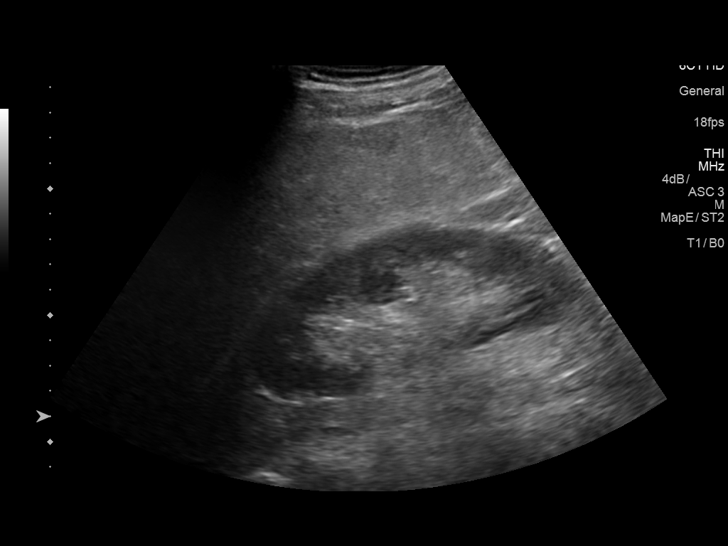
[im 70/94]
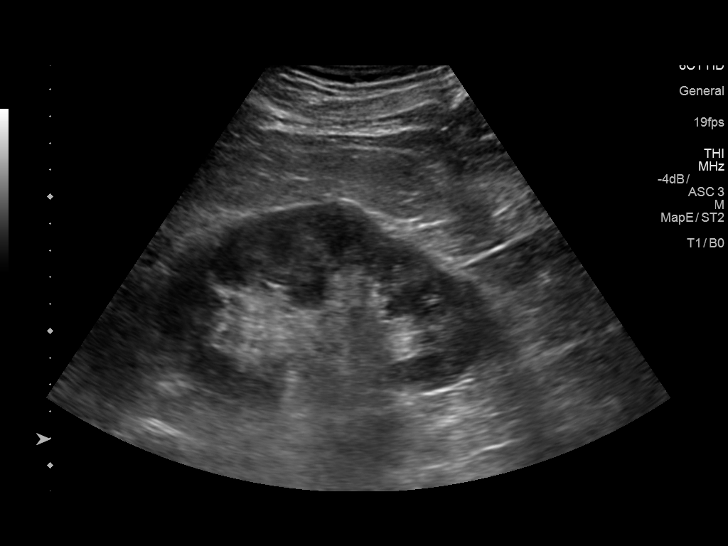
[im 78/94]
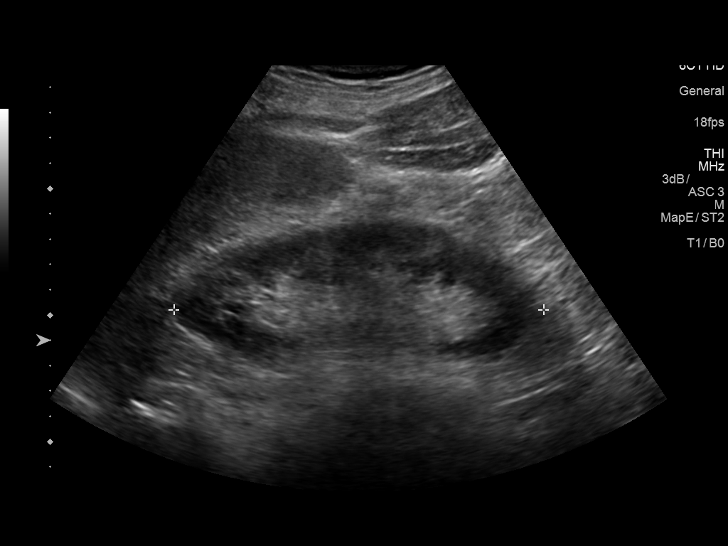
[im 86/94]
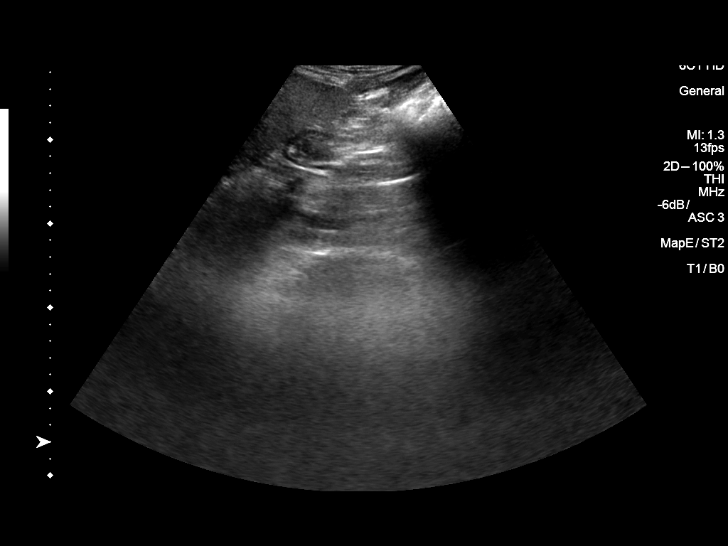
[im 94/94]
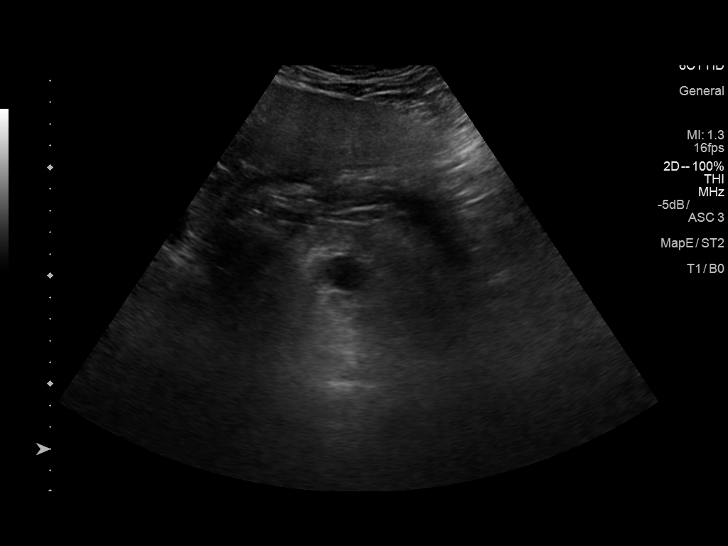

[13 of 25 positions shown; findings below may reference images not displayed]

FINDINGS: Gallbladder: No gallstones or wall thickening visualized. There is
no pericholecystic fluid. No sonographic Murphy sign noted by
sonographer.

Common bile duct: Diameter: 2 mm. No intrahepatic, common hepatic,
or common bile duct dilatation.

Liver: No focal lesion identified. Liver echogenicity is
inhomogeneous and increased. Echotexture is rather coarse. Portal
vein is patent on color Doppler imaging with normal direction of
blood flow towards the liver.

IVC: No abnormality visualized.

Pancreas: Visualized portion unremarkable. Portions of pancreas
obscured by gas.

Spleen: Spleen measures 18.1 x 18.3 x 8.8 cm with a measured splenic
volume of 1,531 cubic cm. No focal splenic lesions are evident.

Right Kidney: Length: 13.2 cm. Echogenicity within normal limits. No
mass or hydronephrosis visualized.

Left Kidney: Length: 15.1 cm. Echogenicity within normal limits. No
mass or hydronephrosis visualized.

Abdominal aorta: The patient has had aortic stent grafting with
shadowing from the stent graft noted. No periaortic fluid evident.
Maximum diameter of the aorta is difficult to ascertain with
certainty given the shadowing from the stent. There does not appear
to be enlargement of the aorta compared to recent CT examination.

Other findings: No demonstrable ascites.
IMPRESSION: 1. The echotexture of the liver is coarse and inhomogeneous with
overall increase consistent with known cirrhosis. There may be
superimposed hepatic steatosis is well. While no focal liver lesions
are evident on this study, it must be cautioned that the sensitivity
of ultrasound for detection of focal liver lesions is diminished
significantly in this circumstance.

2.  Splenomegaly without focal splenic lesion.

3. Stent graft in distal aorta. No periaortic fluid. No obvious
change in the appearance of the aorta compared to recent CT.

4. Portions of pancreas obscured by gas. Visualized portions of
pancreas normal in appearance.

Aortic aneurysm NOS ([id number]).

## 2018-07-10 DIAGNOSIS — J449 Chronic obstructive pulmonary disease, unspecified: Secondary | ICD-10-CM | POA: Diagnosis not present

## 2018-08-08 DIAGNOSIS — J449 Chronic obstructive pulmonary disease, unspecified: Secondary | ICD-10-CM | POA: Diagnosis not present

## 2018-08-26 DIAGNOSIS — M25512 Pain in left shoulder: Secondary | ICD-10-CM | POA: Diagnosis not present

## 2018-09-04 DIAGNOSIS — D519 Vitamin B12 deficiency anemia, unspecified: Secondary | ICD-10-CM | POA: Diagnosis not present

## 2018-09-04 DIAGNOSIS — Z9119 Patient's noncompliance with other medical treatment and regimen: Secondary | ICD-10-CM | POA: Diagnosis not present

## 2018-09-04 DIAGNOSIS — F1120 Opioid dependence, uncomplicated: Secondary | ICD-10-CM | POA: Diagnosis not present

## 2018-09-04 DIAGNOSIS — I119 Hypertensive heart disease without heart failure: Secondary | ICD-10-CM | POA: Diagnosis not present

## 2018-09-04 DIAGNOSIS — E1151 Type 2 diabetes mellitus with diabetic peripheral angiopathy without gangrene: Secondary | ICD-10-CM | POA: Diagnosis not present

## 2018-09-04 DIAGNOSIS — G894 Chronic pain syndrome: Secondary | ICD-10-CM | POA: Diagnosis not present

## 2018-09-04 DIAGNOSIS — R0902 Hypoxemia: Secondary | ICD-10-CM | POA: Diagnosis not present

## 2018-09-04 DIAGNOSIS — I878 Other specified disorders of veins: Secondary | ICD-10-CM | POA: Diagnosis not present

## 2018-09-04 DIAGNOSIS — F329 Major depressive disorder, single episode, unspecified: Secondary | ICD-10-CM | POA: Diagnosis not present

## 2018-09-08 DIAGNOSIS — J449 Chronic obstructive pulmonary disease, unspecified: Secondary | ICD-10-CM | POA: Diagnosis not present

## 2018-09-17 ENCOUNTER — Other Ambulatory Visit: Payer: Self-pay | Admitting: Gastroenterology

## 2018-09-17 DIAGNOSIS — R109 Unspecified abdominal pain: Secondary | ICD-10-CM | POA: Diagnosis not present

## 2018-09-17 DIAGNOSIS — K7460 Unspecified cirrhosis of liver: Secondary | ICD-10-CM | POA: Diagnosis not present

## 2018-09-21 ENCOUNTER — Other Ambulatory Visit: Payer: Self-pay

## 2018-09-21 ENCOUNTER — Ambulatory Visit
Admission: RE | Admit: 2018-09-21 | Discharge: 2018-09-21 | Disposition: A | Discharge status: Home / Self Care | Payer: Medicare HMO | Source: Ambulatory Visit | Attending: Gastroenterology | Admitting: Gastroenterology

## 2018-09-21 DIAGNOSIS — R109 Unspecified abdominal pain: Secondary | ICD-10-CM | POA: Diagnosis not present

## 2018-09-21 DIAGNOSIS — K7460 Unspecified cirrhosis of liver: Secondary | ICD-10-CM | POA: Diagnosis not present

## 2018-09-21 MED ORDER — IOPAMIDOL (ISOVUE-370) INJECTION 76%
75.0000 mL | Freq: Once | INTRAVENOUS | Status: AC | PRN
  Administered 2018-09-21: 75 mL via INTRAVENOUS

## 2018-09-28 ENCOUNTER — Other Ambulatory Visit: Payer: Self-pay | Admitting: Gastroenterology

## 2018-09-28 DIAGNOSIS — K7469 Other cirrhosis of liver: Secondary | ICD-10-CM

## 2018-10-02 ENCOUNTER — Telehealth: Payer: Self-pay | Admitting: Interventional Cardiology

## 2018-10-02 NOTE — Telephone Encounter (Signed)
Pt agrees to virtual visit. Pt has MyChart and laptop

## 2018-10-02 NOTE — Telephone Encounter (Signed)
Attempted to contact pt in regards to appt with Dr. Tamala Julian 4/30.  Left message to call back.  Pt needs to be changed to a virtual visit with Doximity if able, otherwise can do phone visit.  Can be left at 3pm on 4/30, just need to change to virtual if pt is agreeable.

## 2018-10-02 NOTE — Telephone Encounter (Signed)
Scheduler made me aware that wife called back and said video visit was fine.  Updated appt.

## 2018-10-06 ENCOUNTER — Telehealth: Payer: Self-pay

## 2018-10-06 NOTE — Telephone Encounter (Signed)
YOUR CARDIOLOGY TEAM HAS ARRANGED FOR AN E-VISIT FOR YOUR APPOINTMENT - PLEASE REVIEW IMPORTANT INFORMATION BELOW SEVERAL DAYS PRIOR TO YOUR APPOINTMENT  Due to the recent COVID-19 pandemic, we are transitioning in-person office visits to tele-medicine visits in an effort to decrease unnecessary exposure to our patients, their families, and staff. These visits are billed to your insurance just like a normal visit is. We also encourage you to sign up for MyChart if you have not already done so. You will need a smartphone if possible. For patients that do not have this, we can still complete the visit using a regular telephone but do prefer a smartphone to enable video when possible. You may have a family member that lives with you that can help. If possible, we also ask that you have a blood pressure cuff and scale at home to measure your blood pressure, heart rate and weight prior to your scheduled appointment. Patients with clinical needs that need an in-person evaluation and testing will still be able to come to the office if absolutely necessary. If you have any questions, feel free to call our office.     YOUR PROVIDER WILL BE USING THE FOLLOWING PLATFORM TO COMPLETE YOUR VISIT: Doximity  . IF USING MYCHART - How to Download the MyChart App to Your SmartPhone   - If Apple, go to App Store and type in MyChart in the search bar and download the app. If Android, ask patient to go to Google Play Store and type in MyChart in the search bar and download the app. The app is free but as with any other app downloads, your phone may require you to verify saved payment information or Apple/Android password.  - You will need to then log into the app with your MyChart username and password, and select [facility] as your healthcare provider to link the account.  - When it is time for your visit, go to the MyChart app, find appointments, and click Begin Video Visit. Be sure to Select Allow for your device to  access the Microphone and Camera for your visit. You will then be connected, and your provider will be with you shortly.  **If you have any issues connecting or need assistance, please contact MyChart service desk [phone] ([phone])**  **If using a computer, in order to ensure the best quality for your visit, you will need to use either of the following Internet Browsers: Google Chrome or Microsoft Edge**  . IF USING DOXIMITY or DOXY.ME - The staff will give you instructions on receiving your link to join the meeting the day of your visit.      2-3 DAYS BEFORE YOUR APPOINTMENT  You will receive a telephone call from one of our HeartCare team members - your caller ID may say "Unknown caller." If this is a video visit, we will walk you through how to get the video launched on your phone. We will remind you check your blood pressure, heart rate and weight prior to your scheduled appointment. If you have an Apple Watch or Kardia, please upload any pertinent ECG strips the day before or morning of your appointment to MyChart. Our staff will also make sure you have reviewed the consent and agree to move forward with your scheduled tele-health visit.     THE DAY OF YOUR APPOINTMENT  Approximately 15 minutes prior to your scheduled appointment, you will receive a telephone call from one of HeartCare team - your caller ID may say "Unknown caller."    Our staff will confirm medications, vital signs for the day and any symptoms you may be experiencing. Please have this information available prior to the time of visit start. It may also be helpful for you to have a pad of paper and pen handy for any instructions given during your visit. They will also walk you through joining the smartphone meeting if this is a video visit.    CONSENT FOR TELE-HEALTH VISIT - PLEASE REVIEW  I hereby voluntarily request, consent and authorize [facility] and its employed or contracted physicians, physician  assistants, nurse practitioners or other licensed health care professionals (the Practitioner), to provide me with telemedicine health care services (the "Services") as deemed necessary by the treating Practitioner. I acknowledge and consent to receive the Services by the Practitioner via telemedicine. I understand that the telemedicine visit will involve communicating with the Practitioner through live audiovisual communication technology and the disclosure of certain medical information by electronic transmission. I acknowledge that I have been given the opportunity to request an in-person assessment or other available alternative prior to the telemedicine visit and am voluntarily participating in the telemedicine visit.  I understand that I have the right to withhold or withdraw my consent to the use of telemedicine in the course of my care at any time, without affecting my right to future care or treatment, and that the Practitioner or I may terminate the telemedicine visit at any time. I understand that I have the right to inspect all information obtained and/or recorded in the course of the telemedicine visit and may receive copies of available information for a reasonable fee.  I understand that some of the potential risks of receiving the Services via telemedicine include:  . Delay or interruption in medical evaluation due to technological equipment failure or disruption; . Information transmitted may not be sufficient (e.g. poor resolution of images) to allow for appropriate medical decision making by the Practitioner; and/or  . In rare instances, security protocols could fail, causing a breach of personal health information.  Furthermore, I acknowledge that it is my responsibility to provide information about my medical history, conditions and care that is complete and accurate to the best of my ability. I acknowledge that Practitioner's advice, recommendations, and/or decision may be based on  factors not within their control, such as incomplete or inaccurate data provided by me or distortions of diagnostic images or specimens that may result from electronic transmissions. I understand that the practice of medicine is not an exact science and that Practitioner makes no warranties or guarantees regarding treatment outcomes. I acknowledge that I will receive a copy of this consent concurrently upon execution via email to the email address I last provided but may also request a printed copy by calling the office of [facility].    I understand that my insurance will be billed for this visit.   I have read or had this consent read to me. . I understand the contents of this consent, which adequately explains the benefits and risks of the Services being provided via telemedicine.  . I have been provided ample opportunity to ask questions regarding this consent and the Services and have had my questions answered to my satisfaction. . I give my informed consent for the services to be provided through the use of telemedicine in my medical care  By participating in this telemedicine visit I agree to the above.  

## 2018-10-07 NOTE — Progress Notes (Signed)
Virtual Visit via Video Note   This visit type was conducted due to national recommendations for restrictions regarding the COVID-19 Pandemic (e.g. social distancing) in an effort to limit this patient's exposure and mitigate transmission in our community.  Due to his co-morbid illnesses, this patient is at least at moderate risk for complications without adequate follow up.  This format is felt to be most appropriate for this patient at this time.  All issues noted in this document were discussed and addressed.  A limited physical exam was performed with this format.  Please refer to the patient's chart for his consent to telehealth for [facility].   Evaluation Performed:  Follow-up visit  Date:  10/08/2018   ID:  Christian Mccall, DOB [birth year], MRN [id number]  Patient Location: Home Provider Location: Office  PCP:  Shon Baton, MD  Cardiologist:  Sinclair Grooms, MD  Electrophysiologist:  None   Chief Complaint:  CAD  History of Present Illness:    Christian Mccall is a 63 y.o. male with Coronary artery disease, coronary bypass grafting 2002, abdominal aortic aneurysm with endovascular repair and known endoleak 2012, COPD, type 2 diabetes, prior myocardial infarction, history of CVA, obstructive sleep apnea, essential hypertension and hyperlipidemia   4-6 month H/O chest tigghtness occurring at rest shortly after sitting to rest. Has a long history of chest pain. Prior h/o CAD with CABG.Has not tried NTG because it insites chest pain.Has not been able to do much walking because of severe COPD. He has not had radiation to neck or arm. Wears oxygen at night during sleep. This is a recent addition.  Has endo-leak of AAA EVAR graft. He is to be followed up by Dr. Oneida Alar.  The patient does not have symptoms concerning for COVID-19 infection (fever, chills, cough, or new shortness of breath).    Past Medical History:  Diagnosis Date  . AAA (abdominal aortic aneurysm) ([place])   .  Anginal pain ([place])   . Bulging lumbar disc    "& some in my neck"  . C. difficile colitis    2008 OR 09  . CAD (coronary artery disease)   . Change in platelet count    pt states 'has been low since 2012; patient had to receive platelets in surgery in Nov 2016'  . Chronic back pain   . Cirrhosis ([place])   . COPD (chronic obstructive pulmonary disease) ([place])   . Diabetic peripheral neuropathy ([place])   . Emphysema lung ([place])   . Enlarged liver   . Enlarged prostate   . GERD (gastroesophageal reflux disease)   . Hardening of the arteries of the brain   . Headache    "~ weekly" (04/12/2015)  . Heart attack [facility]) Nov. 15,2016   TIA  . History of colon polyps   . Hyperlipidemia   . Hypertension   . Leg pain    with walking and pain in feet while lying flat  . Memory loss    Due to hardening of the arteries in the brain  . Myocardial infarction ([place])    "I've had probably 6-7" (04/12/2015)  . Restless leg syndrome   . Rheumatic fever    AS A CHILD  ?6 YRS OLD  . RLS (restless legs syndrome)   . Shortness of breath 12/06/10   while lying flat and with exertion  . Snoring   . Spleen enlarged   . Stroke [facility]) 2004   denies residual on 04/12/2015  . Thrombocytopenia (  [place])    since 2012; can be as low as 54K.   [name] TIA (transient ischemic attack) "several"   "since 2015" (04/12/2015)  . Type II diabetes mellitus ([place]) dx'd 2009   Past Surgical History:  Procedure Laterality Date  . ABDOMINAL AORTIC ANEURYSM REPAIR  01/16/2011   EVAR  . ABDOMINAL AORTIC ANEURYSM REPAIR  04/12/2015  . CARDIAC CATHETERIZATION  "several"  . COLONOSCOPY    . COLONOSCOPY W/ BIOPSIES    . COLONOSCOPY WITH PROPOFOL N/A 03/21/2016   Procedure: COLONOSCOPY WITH PROPOFOL;  Surgeon: Arta Silence, MD;  Location: [facility] ENDOSCOPY;  Service: Endoscopy;  Laterality: N/A;  . CORONARY ANGIOPLASTY WITH STENT PLACEMENT  "several"   > 4 sents  . CORONARY ARTERY BYPASS GRAFT  02/18/2001   "CABG x 4"  . Direct sac  puncture with liquid embolic repair of Type II endoleak  04/12/2015; 03/05/2017  . ESOPHAGOGASTRODUODENOSCOPY (EGD) WITH PROPOFOL N/A 03/21/2016   Procedure: ESOPHAGOGASTRODUODENOSCOPY (EGD) WITH PROPOFOL;  Surgeon: Arta Silence, MD;  Location: [facility] ENDOSCOPY;  Service: Endoscopy;  Laterality: N/A;  . IR AORTAGRAM ABDOMINAL SERIALOGRAM  03/05/2017  . IR CT SPINE LTD  03/05/2017  . IR EMBO ARTERIAL NOT HEMORR HEMANG INC GUIDE ROADMAPPING  03/05/2017  . IR RADIOLOGIST EVAL & MGMT  02/05/2017  . IR RADIOLOGIST EVAL & MGMT  04/01/2017  . IR RADIOLOGIST EVAL & MGMT  10/01/2017  . PERIPHERAL VASCULAR CATHETERIZATION N/A 03/10/2015   Procedure: Abdominal Aortogram;  Surgeon: Elam Dutch, MD;  Location: [facility] CV LAB;  Service: Cardiovascular;  Laterality: N/A;  . RADIOLOGY WITH ANESTHESIA N/A 04/12/2015   Procedure: embolization;  Surgeon: Jacqulynn Cadet, MD;  Location: [facility];  Service: Radiology;  Laterality: N/A;  . RADIOLOGY WITH ANESTHESIA N/A 03/05/2017   Procedure: Type II Endoleak Repair;  Surgeon: Jacqulynn Cadet, MD;  Location: [facility];  Service: Radiology;  Laterality: N/A;  . SHOULDER SURGERY Right 07-27-2015  . TONSILLECTOMY  ~ 1965     Current Meds  Medication Sig  . albuterol (PROVENTIL HFA;VENTOLIN HFA) 108 (90 BASE) MCG/ACT inhaler Inhale 2 puffs into the lungs daily as needed for wheezing or shortness of breath.  [name] aspirin EC 81 MG tablet Take 1 tablet (81 mg total) by mouth daily.  . Cholecalciferol (VITAMIN D) 2000 units tablet Take 2,000 Units by mouth 2 (two) times daily.   . clonazePAM (KLONOPIN) 1 MG tablet Take 1 mg by mouth 2 (two) times daily.   . clopidogrel (PLAVIX) 75 MG tablet Take 1 tablet (75 mg total) by mouth daily.  . cyanocobalamin (,VITAMIN B-12,) 1000 MCG/ML injection Inject 1,000 mcg into the muscle See admin instructions. Every 30 to 60 days  . empagliflozin (JARDIANCE) 10 MG TABS tablet Take 10 mg by mouth daily.  [name] escitalopram (LEXAPRO) 20 MG tablet  Take 20 mg by mouth at bedtime.  [name] ezetimibe-simvastatin (VYTORIN) 10-40 MG per tablet Take 1 tablet by mouth at bedtime.   . fenofibrate 160 MG tablet Take 160 mg by mouth daily.   . Fluticasone-Salmeterol (ADVAIR) 100-50 MCG/DOSE AEPB Inhale 1 puff into the lungs 2 (two) times daily.   . furosemide (LASIX) 40 MG tablet Take 40 mg by mouth every other day.   [name] glimepiride (AMARYL) 4 MG tablet Take 4 mg by mouth every evening.   . lansoprazole (PREVACID) 30 MG capsule Take 30 mg by mouth daily.   [name] LANTUS SOLOSTAR 100 UNIT/ML Solostar Pen INJECT 12 UNITS SQ HS  . losartan (COZAAR) 50 MG tablet  Take 50 mg by mouth daily.    . metFORMIN (GLUCOPHAGE) 1000 MG tablet Take 1,000 mg by mouth daily with breakfast.   . metoprolol (LOPRESSOR) 50 MG tablet Take 50 mg by mouth 2 (two) times daily.   . nitroGLYCERIN (NITROSTAT) 0.4 MG SL tablet Place 0.4 mg under the tongue every 5 (five) minutes as needed for chest pain.  [name] oxyCODONE-acetaminophen (PERCOCET) 5-325 MG tablet Take 1 tablet by mouth every 4 (four) hours as needed (max 6 q).  . OXYGEN Inhale into the lungs. 2L at night  . pramipexole (MIRAPEX) 0.25 MG tablet Take 0.25 mg by mouth at bedtime.   . pregabalin (LYRICA) 100 MG capsule Take 100-200 mg by mouth See admin instructions. Take 1 tablet (100 mg) by mouth every morning and 2 tablets (200 mg) at bedtime  . ranolazine (RANEXA) 1000 MG SR tablet Take 1,000 mg by mouth 2 (two) times daily.    [name] terazosin (HYTRIN) 1 MG capsule Take 1 mg by mouth at bedtime.   [name] tiotropium (SPIRIVA) 18 MCG inhalation capsule Place 18 mcg into inhaler and inhale daily.      Allergies:   Patient has no known allergies.   Social History   Tobacco Use  . Smoking status: Former Smoker    Packs/day: 0.00    Years: 30.00    Pack years: 0.00    Types: Cigarettes    Last attempt to quit: 02/17/2001    Years since quitting: 17.6  . Smokeless tobacco: Never Used  Substance Use Topics  . Alcohol use: Yes     Alcohol/week: 1.0 standard drinks    Types: 1 Cans of beer per week    Comment: occasional  . Drug use: No     Family Hx: The patient's family history includes Aneurysm in his maternal aunt, maternal uncle, and mother; Cancer in his sister; Heart attack in his brother, brother, and brother; Heart disease in his brother, brother, mother, and sister; Hyperlipidemia in his father and mother; Hypertension in his brother, father, and mother; Lupus in his sister; Other in his maternal aunt, maternal uncle, and mother.  ROS:   Please see the history of present illness.    Chronic nocturnal O2 therapy.  All other systems reviewed and are negative.   Prior CV studies:   The following studies were reviewed today:  No new cardiac imaging or functional data.  Labs/Other Tests and Data Reviewed:    EKG:  No ECG reviewed.  Recent Labs: 03/16/2018: ALT 40; BUN 8; Creatinine, Ser 0.76; Hemoglobin 15.9; Platelets 70; Potassium 4.4; Sodium 132   Recent Lipid Panel Lab Results  Component Value Date/Time   CHOL 129 05/02/2013 04:10 AM   TRIG 230 (H) 05/02/2013 04:10 AM   HDL 69 05/02/2013 04:10 AM   CHOLHDL 1.9 05/02/2013 04:10 AM   LDLCALC 14 05/02/2013 04:10 AM    Wt Readings from Last 3 Encounters:  10/08/18 202 lb (91.6 kg)  03/19/18 205 lb 0.4 oz (93 kg)  03/16/18 206 lb 6.4 oz (93.6 kg)     Objective:    Vital Signs:  BP (!) 142/73   Pulse 60   Ht 5\' 11"  (1.803 m)   Wt 202 lb (91.6 kg)   BMI 28.17 kg/m    VITAL SIGNS:  reviewed  ASSESSMENT & PLAN:    1. Coronary artery disease of bypass graft of native heart with stable angina pectoris ([place])   2. Essential hypertension, benign   3. Hyperlipidemia LDL goal <  70   4. Abdominal aortic aneurysm (AAA) without rupture ([place])   5. 2019 novel coronavirus disease (COVID-19)    PLAN:  1. NTG to be taken SL for chest pressure. Report response. OV 1-2 months. If pain NTG responsive, will need earlier appointment and may need cath.  2. Target 130/80 mmHg.  3. LDL < 70 is target. 4. Needs f/u with [facility]- Dr. Oneida Alar.  Overall education and awareness concerning primary/secondary risk prevention was discussed in detail: LDL less than 70, hemoglobin A1c less than 7, blood pressure target less than 130/80 mmHg, >150 minutes of moderate aerobic activity per week, avoidance of smoking, weight control (via diet and exercise), and continued surveillance/management of/for obstructive sleep apnea.   COVID-19 Education: The signs and symptoms of COVID-19 were discussed with the patient and how to seek care for testing (follow up with PCP or arrange E-visit).  The importance of social distancing was discussed today.  Time:   Today, I have spent 18  minutes with the patient with telehealth technology discussing the above problems.     Medication Adjustments/Labs and Tests Ordered: Current medicines are reviewed at length with the patient today.  Concerns regarding medicines are outlined above.   Tests Ordered: No orders of the defined types were placed in this encounter.   Medication Changes: No orders of the defined types were placed in this encounter.   Disposition:  Follow up in 6 week(s). Hopefully office will be open then.  Signed, Sinclair Grooms, MD  10/08/2018 3:54 PM    [place]

## 2018-10-08 ENCOUNTER — Telehealth (INDEPENDENT_AMBULATORY_CARE_PROVIDER_SITE_OTHER): Payer: Medicare HMO | Admitting: Interventional Cardiology

## 2018-10-08 ENCOUNTER — Encounter: Payer: Self-pay | Admitting: Interventional Cardiology

## 2018-10-08 ENCOUNTER — Other Ambulatory Visit: Payer: Self-pay

## 2018-10-08 VITALS — BP 142/73 | HR 60 | Ht 71.0 in | Wt 202.0 lb

## 2018-10-08 DIAGNOSIS — I25708 Atherosclerosis of coronary artery bypass graft(s), unspecified, with other forms of angina pectoris: Secondary | ICD-10-CM

## 2018-10-08 DIAGNOSIS — J449 Chronic obstructive pulmonary disease, unspecified: Secondary | ICD-10-CM | POA: Diagnosis not present

## 2018-10-08 DIAGNOSIS — E785 Hyperlipidemia, unspecified: Secondary | ICD-10-CM

## 2018-10-08 DIAGNOSIS — I714 Abdominal aortic aneurysm, without rupture, unspecified: Secondary | ICD-10-CM

## 2018-10-08 DIAGNOSIS — I10 Essential (primary) hypertension: Secondary | ICD-10-CM

## 2018-10-08 MED ORDER — NITROGLYCERIN 0.4 MG SL SUBL: 0.4000 mg | SUBLINGUAL_TABLET | SUBLINGUAL | 3 refills | Status: DC | PRN

## 2018-10-08 NOTE — Patient Instructions (Signed)
Medication Instructions:  Your physician recommends that you continue on your current medications as directed. Please refer to the Current Medication list given to you today.  If you need a refill on your cardiac medications before your next appointment, please call your pharmacy.   Lab work: None If you have labs (blood work) drawn today and your tests are completely normal, you will receive your results only by: . MyChart Message (if you have MyChart) OR . A paper copy in the mail If you have any lab test that is abnormal or we need to change your treatment, we will call you to review the results.  Testing/Procedures: None  Follow-Up: At [facility], you and your health needs are our priority.  As part of our continuing mission to provide you with exceptional heart care, we have created designated Provider Care Teams.  These Care Teams include your primary Cardiologist (physician) and Advanced Practice Providers (APPs -  Physician Assistants and Nurse Practitioners) who all work together to provide you with the care you need, when you need it. You will need a follow up appointment in 2 months.  Please call our office 2 months in advance to schedule this appointment.  You may see Henry W Degraff III, MD or one of the following Advanced Practice Providers on your designated Care Team:   Lori Gerhardt, NP Laura Ingold, NP . Jill McDaniel, NP  Any Other Special Instructions Will Be Listed Below (If Applicable).    

## 2018-10-13 ENCOUNTER — Other Ambulatory Visit: Payer: Self-pay

## 2018-10-13 ENCOUNTER — Ambulatory Visit
Admission: RE | Admit: 2018-10-13 | Discharge: 2018-10-13 | Disposition: A | Discharge status: Home / Self Care | Payer: Medicare HMO | Source: Ambulatory Visit | Attending: Gastroenterology | Admitting: Gastroenterology

## 2018-10-13 DIAGNOSIS — K7469 Other cirrhosis of liver: Secondary | ICD-10-CM

## 2018-10-19 DIAGNOSIS — J449 Chronic obstructive pulmonary disease, unspecified: Secondary | ICD-10-CM | POA: Diagnosis not present

## 2018-10-19 DIAGNOSIS — E669 Obesity, unspecified: Secondary | ICD-10-CM | POA: Diagnosis not present

## 2018-10-19 DIAGNOSIS — E1151 Type 2 diabetes mellitus with diabetic peripheral angiopathy without gangrene: Secondary | ICD-10-CM | POA: Diagnosis not present

## 2018-10-19 DIAGNOSIS — I714 Abdominal aortic aneurysm, without rupture: Secondary | ICD-10-CM | POA: Diagnosis not present

## 2018-10-19 DIAGNOSIS — I119 Hypertensive heart disease without heart failure: Secondary | ICD-10-CM | POA: Diagnosis not present

## 2018-10-19 DIAGNOSIS — Z9119 Patient's noncompliance with other medical treatment and regimen: Secondary | ICD-10-CM | POA: Diagnosis not present

## 2018-10-28 ENCOUNTER — Other Ambulatory Visit: Payer: Self-pay | Admitting: Gastroenterology

## 2018-10-28 DIAGNOSIS — K7469 Other cirrhosis of liver: Secondary | ICD-10-CM

## 2018-11-06 ENCOUNTER — Telehealth: Payer: Self-pay | Admitting: Interventional Cardiology

## 2018-11-06 NOTE — Telephone Encounter (Signed)
Attempted to contact pt about appt scheduled with Dr. Tamala Julian on 6/2.  Left message to call back.  Needs to be moved to 6/11 at 1:40pm for in person visit if possible, otherwise ok to change to virtual on 6/2.  Please send call to me to schedule.

## 2018-11-06 NOTE — Telephone Encounter (Signed)
Screened for COVID. Wife denies pt has any sx of COVID or that he has been around anyone tested or positive for COVID.      COVID-19 Pre-Screening Questions:  . In the past 7 to 10 days have you had a cough,  shortness of breath, headache, congestion, fever (100 or greater) body aches, chills, sore throat, or sudden loss of taste or sense of smell? . Have you been around anyone with known Covid 19. . Have you been around anyone who is awaiting Covid 19 test results in the past 7 to 10 days? . Have you been around anyone who has been exposed to Covid 19, or has mentioned symptoms of Covid 19 within the past 7 to 10 days?  If you have any concerns/questions about symptoms patients report during screening (either on the phone or at threshold). Contact the provider seeing the patient or DOD for further guidance.  If neither are available contact a member of the leadership team.

## 2018-11-06 NOTE — Telephone Encounter (Signed)
April, [place] spoke with Christian Mccall/wife and they mentioned that Christian Mccall has been taking multiple nitro for CP and having issues with SOB.  April made me aware and I reached out to Dr. Tamala Julian.  Dr. Tamala Julian said Christian Mccall needs to be put on APP schedule ASAP for eval and possible cath.  Spoke with wife and went over recommendations.  Scheduled Christian Mccall to see Pecolia Ades, NP on 6/2.  Advised to go to ED if any changes.  Wife verbalized understanding and was appreciative for call.

## 2018-11-08 DIAGNOSIS — J449 Chronic obstructive pulmonary disease, unspecified: Secondary | ICD-10-CM | POA: Diagnosis not present

## 2018-11-10 ENCOUNTER — Ambulatory Visit: Payer: Medicare HMO | Admitting: Interventional Cardiology

## 2018-11-10 ENCOUNTER — Other Ambulatory Visit: Payer: Self-pay

## 2018-11-10 ENCOUNTER — Ambulatory Visit: Payer: Medicare HMO | Admitting: Cardiology

## 2018-11-10 ENCOUNTER — Other Ambulatory Visit (HOSPITAL_COMMUNITY)
Admission: RE | Admit: 2018-11-10 | Discharge: 2018-11-10 | Disposition: A | Discharge status: Home / Self Care | Payer: Medicare HMO | Source: Ambulatory Visit | Attending: Interventional Cardiology | Admitting: Interventional Cardiology

## 2018-11-10 ENCOUNTER — Encounter: Payer: Self-pay | Admitting: Cardiology

## 2018-11-10 VITALS — BP 132/66 | HR 63 | Ht 71.0 in | Wt 211.0 lb

## 2018-11-10 DIAGNOSIS — I200 Unstable angina: Secondary | ICD-10-CM

## 2018-11-10 DIAGNOSIS — I714 Abdominal aortic aneurysm, without rupture, unspecified: Secondary | ICD-10-CM

## 2018-11-10 DIAGNOSIS — Z1159 Encounter for screening for other viral diseases: Secondary | ICD-10-CM | POA: Diagnosis not present

## 2018-11-10 DIAGNOSIS — E1151 Type 2 diabetes mellitus with diabetic peripheral angiopathy without gangrene: Secondary | ICD-10-CM | POA: Diagnosis not present

## 2018-11-10 DIAGNOSIS — I25110 Atherosclerotic heart disease of native coronary artery with unstable angina pectoris: Secondary | ICD-10-CM

## 2018-11-10 DIAGNOSIS — I10 Essential (primary) hypertension: Secondary | ICD-10-CM | POA: Diagnosis not present

## 2018-11-10 DIAGNOSIS — E118 Type 2 diabetes mellitus with unspecified complications: Secondary | ICD-10-CM | POA: Diagnosis not present

## 2018-11-10 DIAGNOSIS — E785 Hyperlipidemia, unspecified: Secondary | ICD-10-CM

## 2018-11-10 NOTE — H&P (View-Only) (Signed)
Cardiology Office Note:    Date:  11/10/2018   ID:  [name], DOB [birth year], MRN [id number]  PCP:  Shon Baton, MD  Cardiologist:  Sinclair Grooms, MD  Referring MD: Shon Baton, MD   Chief Complaint  Patient presents with  . Chest Pain    History of Present Illness:    Christian Mccall is a 63 y.o. male with a past medical history significant for CAD s/p CABG 2002, abdominal aortic aneurysm with endovascular repair and known endoleak 2012, severe COPD with use of nocturnal oxygen, type 2 diabetes, prior myocardial infarction, history of CVA, obstructive sleep apnea on oxygen at night, essential hypertension and hyperlipidemia.  He has had long standing chest pain.   Pt called on 5/29 with c/o CP requiring multiple uses of NTG and issues with shortness of breath. Dr. Tamala Julian arranged for in office visit and pt may need cath.   Christian Mccall is here today for further evaluation and his wife is present via phone. Christian Mccall has had long standing left lateral chest pain but is now having central substernal chest pain. Sometimes it lasts up to an hour. He has tried SL NTG, but it causes such an extreme headache that it makes it hard to tell if it helps. Early last week he did take NTG that helped after the second dose then the pain returned so he took a third tablet which then resolved his pain. Sometimes his chest pain occurs with activity or sometimes when sitting down watching TV in the evening. He does get short of breath with the pain. He has had lightheadedness for several years, no worse lately.   Recently on occasion his chest pain goes to his left elbow and down to his wrist. This has concerned him and his wife. He and his wife are concerned because his chest pain is different than it has been in the past.    Past Medical History:  Diagnosis Date  . AAA (abdominal aortic aneurysm) ([place])   . Anginal pain ([place])   . Bulging lumbar disc    "& some in my neck"  . C. difficile  colitis    2008 OR 09  . CAD (coronary artery disease)   . Change in platelet count    pt states 'has been low since 2012; patient had to receive platelets in surgery in Nov 2016'  . Chronic back pain   . Cirrhosis ([place])   . COPD (chronic obstructive pulmonary disease) ([place])   . Diabetic peripheral neuropathy ([place])   . Emphysema lung ([place])   . Enlarged liver   . Enlarged prostate   . GERD (gastroesophageal reflux disease)   . Hardening of the arteries of the brain   . Headache    "~ weekly" (04/12/2015)  . Heart attack [facility]) Nov. 15,2016   TIA  . History of colon polyps   . Hyperlipidemia   . Hypertension   . Leg pain    with walking and pain in feet while lying flat  . Memory loss    Due to hardening of the arteries in the brain  . Myocardial infarction ([place])    "I've had probably 6-7" (04/12/2015)  . Restless leg syndrome   . Rheumatic fever    AS A CHILD  ?63 YRS OLD  . RLS (restless legs syndrome)   . Shortness of breath 12/06/10   while lying flat and with exertion  . Snoring   . Spleen enlarged   .  Stroke [facility]) 2004   denies residual on 04/12/2015  . Thrombocytopenia ([place])    since 2012; can be as low as 54K.   [name] TIA (transient ischemic attack) "several"   "since 2015" (04/12/2015)  . Type II diabetes mellitus ([place]) dx'd 2009    Past Surgical History:  Procedure Laterality Date  . ABDOMINAL AORTIC ANEURYSM REPAIR  01/16/2011   EVAR  . ABDOMINAL AORTIC ANEURYSM REPAIR  04/12/2015  . CARDIAC CATHETERIZATION  "several"  . COLONOSCOPY    . COLONOSCOPY W/ BIOPSIES    . COLONOSCOPY WITH PROPOFOL N/A 03/21/2016   Procedure: COLONOSCOPY WITH PROPOFOL;  Surgeon: Arta Silence, MD;  Location: [facility] ENDOSCOPY;  Service: Endoscopy;  Laterality: N/A;  . CORONARY ANGIOPLASTY WITH STENT PLACEMENT  "several"   > 4 sents  . CORONARY ARTERY BYPASS GRAFT  02/18/2001   "CABG x 4"  . Direct sac puncture with liquid embolic repair of Type II endoleak  04/12/2015; 03/05/2017  .  ESOPHAGOGASTRODUODENOSCOPY (EGD) WITH PROPOFOL N/A 03/21/2016   Procedure: ESOPHAGOGASTRODUODENOSCOPY (EGD) WITH PROPOFOL;  Surgeon: Arta Silence, MD;  Location: [facility] ENDOSCOPY;  Service: Endoscopy;  Laterality: N/A;  . IR AORTAGRAM ABDOMINAL SERIALOGRAM  03/05/2017  . IR CT SPINE LTD  03/05/2017  . IR EMBO ARTERIAL NOT HEMORR HEMANG INC GUIDE ROADMAPPING  03/05/2017  . IR RADIOLOGIST EVAL & MGMT  02/05/2017  . IR RADIOLOGIST EVAL & MGMT  04/01/2017  . IR RADIOLOGIST EVAL & MGMT  10/01/2017  . PERIPHERAL VASCULAR CATHETERIZATION N/A 03/10/2015   Procedure: Abdominal Aortogram;  Surgeon: Elam Dutch, MD;  Location: [place] CV LAB;  Service: Cardiovascular;  Laterality: N/A;  . RADIOLOGY WITH ANESTHESIA N/A 04/12/2015   Procedure: embolization;  Surgeon: Jacqulynn Cadet, MD;  Location: [place];  Service: Radiology;  Laterality: N/A;  . RADIOLOGY WITH ANESTHESIA N/A 03/05/2017   Procedure: Type II Endoleak Repair;  Surgeon: Jacqulynn Cadet, MD;  Location: [place];  Service: Radiology;  Laterality: N/A;  . SHOULDER SURGERY Right 07-27-2015  . TONSILLECTOMY  ~ 1965    Current Medications: Current Meds  Medication Sig  . albuterol (PROVENTIL HFA;VENTOLIN HFA) 108 (90 BASE) MCG/ACT inhaler Inhale 2 puffs into the lungs every 6 (six) hours as needed for wheezing or shortness of breath.   [name] aspirin EC 81 MG tablet Take 1 tablet (81 mg total) by mouth daily.  . Cholecalciferol (VITAMIN D) 2000 units tablet Take 2,000 Units by mouth 2 (two) times daily.   . clonazePAM (KLONOPIN) 1 MG tablet Take 1 mg by mouth 2 (two) times daily.   . clopidogrel (PLAVIX) 75 MG tablet Take 1 tablet (75 mg total) by mouth daily.  . cyanocobalamin (,VITAMIN B-12,) 1000 MCG/ML injection Inject 1,000 mcg into the muscle See admin instructions. Every 30 to 60 days  . empagliflozin (JARDIANCE) 10 MG TABS tablet Take 10 mg by mouth daily.  [name] escitalopram (LEXAPRO) 20 MG tablet Take 20 mg by mouth at bedtime.  [name]  ezetimibe-simvastatin (VYTORIN) 10-40 MG per tablet Take 1 tablet by mouth at bedtime.   . fenofibrate 160 MG tablet Take 160 mg by mouth daily.   . Fluticasone-Salmeterol (ADVAIR) 100-50 MCG/DOSE AEPB Inhale 1 puff into the lungs 2 (two) times daily.   . furosemide (LASIX) 40 MG tablet Take 40 mg by mouth every other day.   [name] glimepiride (AMARYL) 4 MG tablet Take 4 mg by mouth every evening.   . lansoprazole (PREVACID) 30 MG capsule Take 30 mg by mouth daily.   [name]  LANTUS SOLOSTAR 100 UNIT/ML Solostar Pen Inject 17 Units into the skin at bedtime.   [name] losartan (COZAAR) 50 MG tablet Take 50 mg by mouth daily.    . metFORMIN (GLUCOPHAGE) 1000 MG tablet Take 1,000 mg by mouth daily with breakfast.   . metoprolol (LOPRESSOR) 50 MG tablet Take 50 mg by mouth 2 (two) times daily.   . nitroGLYCERIN (NITROSTAT) 0.4 MG SL tablet Place 1 tablet (0.4 mg total) under the tongue every 5 (five) minutes as needed for chest pain.  [name] oxyCODONE-acetaminophen (PERCOCET) 5-325 MG tablet Take 1 tablet by mouth every 4 (four) hours as needed (max 6 q).  . OXYGEN Inhale into the lungs. 2L at night  . pramipexole (MIRAPEX) 0.25 MG tablet Take 0.25 mg by mouth at bedtime.   . pregabalin (LYRICA) 100 MG capsule Take 100-200 mg by mouth See admin instructions. Take 1 tablet (100 mg) by mouth every morning and 2 tablets (200 mg) at bedtime  . ranolazine (RANEXA) 1000 MG SR tablet Take 1,000 mg by mouth 2 (two) times daily.    [name] terazosin (HYTRIN) 1 MG capsule Take 1 mg by mouth at bedtime.   [name] tiotropium (SPIRIVA) 18 MCG inhalation capsule Place 18 mcg into inhaler and inhale daily.      Allergies:   Patient has no known allergies.   Social History   Socioeconomic History  . Marital status: Married    Spouse name: reba  . Number of children: 2  . Years of education: 9  . Highest education level: Not on file  Occupational History  . Occupation: disability  Social Needs  . Financial resource strain: Not on file  .  Food insecurity:    Worry: Not on file    Inability: Not on file  . Transportation needs:    Medical: Not on file    Non-medical: Not on file  Tobacco Use  . Smoking status: Former Smoker    Packs/day: 0.00    Years: 30.00    Pack years: 0.00    Types: Cigarettes    Last attempt to quit: 02/17/2001    Years since quitting: 17.7  . Smokeless tobacco: Never Used  Substance and Sexual Activity  . Alcohol use: Yes    Alcohol/week: 1.0 standard drinks    Types: 1 Cans of beer per week    Comment: occasional  . Drug use: No  . Sexual activity: Not Currently  Lifestyle  . Physical activity:    Days per week: Not on file    Minutes per session: Not on file  . Stress: Not on file  Relationships  . Social connections:    Talks on phone: Not on file    Gets together: Not on file    Attends religious service: Not on file    Active member of club or organization: Not on file    Attends meetings of clubs or organizations: Not on file    Relationship status: Not on file  Other Topics Concern  . Not on file  Social History Narrative   Patient lives at home with his wife.      Family History: The patient's family history includes Aneurysm in his maternal aunt, maternal uncle, and mother; Cancer in his sister; Heart attack in his brother, brother, and brother; Heart disease in his brother, brother, mother, and sister; Hyperlipidemia in his father and mother; Hypertension in his brother, father, and mother; Lupus in his sister; Other in his maternal aunt, maternal uncle, and  mother. ROS:   Please see the history of present illness.     All other systems reviewed and are negative.  EKGs/Labs/Other Studies Reviewed:    The following studies were reviewed today:   EKG:  EKG is ordered today.  The ekg ordered today demonstrates NSR, 63 bpm, no ischemic changes.  Recent Labs: 03/16/2018: ALT 40; BUN 8; Creatinine, Ser 0.76; Hemoglobin 15.9; Platelets 70; Potassium 4.4; Sodium 132    Recent Lipid Panel    Component Value Date/Time   CHOL 129 05/02/2013 0410   TRIG 230 (H) 05/02/2013 0410   HDL 69 05/02/2013 0410   CHOLHDL 1.9 05/02/2013 0410   VLDL 46 (H) 05/02/2013 0410   LDLCALC 14 05/02/2013 0410    Physical Exam:    VS:  BP 132/66   Pulse 63   Ht 5\' 11"  (1.803 m)   Wt 211 lb (95.7 kg)   SpO2 95%   BMI 29.43 kg/m     Wt Readings from Last 3 Encounters:  11/10/18 211 lb (95.7 kg)  10/08/18 202 lb (91.6 kg)  03/19/18 205 lb 0.4 oz (93 kg)     Physical Exam  Constitutional: He is oriented to person, place, and time. He appears well-nourished.  Cardiovascular: Normal rate, regular rhythm, normal heart sounds and intact distal pulses. Exam reveals no gallop and no friction rub.  No murmur heard. Pulmonary/Chest: Effort normal and breath sounds normal. No respiratory distress. He has no wheezes. He has no rales.  Abdominal: Soft. Bowel sounds are normal.  Musculoskeletal: Normal range of motion.        General: No deformity or edema.  Neurological: He is alert and oriented to person, place, and time.  Skin: Skin is warm and dry.  Psychiatric: He has a normal mood and affect. His behavior is normal. Judgment and thought content normal.  Vitals reviewed.   ASSESSMENT:    1. Unstable angina ([place])   2. Essential (primary) hypertension   3. Hyperlipidemia, unspecified hyperlipidemia type   4. Coronary artery disease involving native coronary artery of native heart with unstable angina pectoris ([place])   5. Type 2 diabetes mellitus with complication, without long-term current use of insulin (H[facility])   6. Abdominal aortic aneurysm (AAA) without rupture ([place])    PLAN:    In order of problems listed above:  1. Unstable angina -Pt with intermittent chest pain and shortness of breath, reportedly different from his usual longstanding chest pain.  Becoming more frequent. Responsive to SL NTG.  -Conitnues on aspirin 81 mg, plavix, betablocker, ARB, ranolazine  -Will arrange for left heart cath to evaluate coronary arteries and grafts. To be done on Friday with Dr. Tamala Julian. -Will need COVID 19 testing prior to procedure. -Will check CBC, BMet. Pt has history of low platelet count, followed by Heme/Onc. Per Dr. Tamala Julian, cath can be done as long as platelet count > 50k. If lower, would need to defer cath until seen by Heme/onc and platelet count improved. Pt aware. -The patient understands that risks include but are not limited to stroke (1 in 1000), death (1 in 74), kidney failure [usually temporary] (1 in 500), bleeding (1 in 200), allergic reaction [possibly serious] (1 in 200).  He and his wife wish to proceed.   2. Hypertension -BP well controlled.   3. Hyperlipidemia with goal LDL<70 -On ezetimibe-simvastatin 10-40mg , fenofibrate -Last lipid panel in 04/2018 per [facility]: TC 121, LDL 25, HDL36.. Triglycerides up to 299- hopefully will improve with better blood sugar control.  -  Continue current therapy.   4. Diabetes type 2 on insulin -On lantus insulin, amaryl, jardiance, metformin. -Will hold metformin on day of procedure and for 48 hrs after.  -A1c was up to 11, insulin increased and pt reports that A1c is now down.   5. Abdominal aortic aneurysm s/p EVAR graft with known endo-leak -Needs follow up with Dr. Oneida Alar. I will request a follow up appt. (we will send a message and his wife will also call the office)   Medication Adjustments/Labs and Tests Ordered: Current medicines are reviewed at length with the patient today.  Concerns regarding medicines are outlined above. Labs and tests ordered and medication changes are outlined in the patient instructions below:  Patient Instructions  Medication Instructions:  Your physician recommends that you continue on your current medications as directed. Please refer to the Current Medication list given to you today.  If you need a refill on your cardiac medications before your next appointment, please  call your pharmacy.   Lab work: TODAY: BMET & CBC  Your Pre-procedure COVID-19 Testing will be done on 11/10/2018 at [facility] at [address] [place], [place] [address] After your swab you will be given a mask to wear and instructed to go home and quarantine/no visitors until after your procedure. If you test positive you will be notified and your procedure will be cancelled.   If you have labs (blood work) drawn today and your tests are completely normal, you will receive your results only by: [name] MyChart Message (if you have MyChart) OR . A paper copy in the mail If you have any lab test that is abnormal or we need to change your treatment, we will call you to review the results.  Testing/Procedures: Your physician has requested that you have a cardiac catheterization. Cardiac catheterization is used to diagnose and/or treat various heart conditions. Doctors may recommend this procedure for a number of different reasons. The most common reason is to evaluate chest pain. Chest pain can be a symptom of coronary artery disease (CAD), and cardiac catheterization can show whether plaque is narrowing or blocking your heart's arteries. This procedure is also used to evaluate the valves, as well as measure the blood flow and oxygen levels in different parts of your heart. For further information please visit [email or web address]. Please follow instruction sheet, as given.   Follow-Up: At [facility], you and your health needs are our priority.  As part of our continuing mission to provide you with exceptional heart care, we have created designated Provider Care Teams.  These Care Teams include your primary Cardiologist (physician) and Advanced Practice Providers (APPs -  Physician Assistants and Nurse Practitioners) who all work together to provide you with the care you need, when you need it. You will need a follow up appointment in 2 weeks.  You may  see Sinclair Grooms, MD or one of the following Advanced Practice Providers on your designated Care Team:   Truitt Merle, NP Cecilie Kicks, NP . Kathyrn Drown, NP  Any Other Special Instructions Will Be Listed Below (If Applicable).    Christian Mccall OFFICE [place], [place]  [address] Dept: [phone] Loc: [place]  11/10/2018  You are scheduled for a Cardiac Catheterization on Friday, June 5 with Dr. Daneen Schick.  1. Please arrive at the [facility] (Main Entrance A) at [facility]  [facility]: [address] [place], [place] [address] at 8:30 AM (This time is two hours before your procedure to ensure your preparation). Free valet parking service is available.   Special note: Every effort is made to have your procedure done on time. Please understand that emergencies sometimes delay scheduled procedures.  2. Diet: Do not eat solid foods after midnight.  The patient may have clear liquids until 5am upon the day of the procedure.   4. Medication instructions in preparation for your procedure:   Contrast Allergy: No  HOLD your lasix the morning of your procedure   Take half of your insulin the night before your test  Do not take Diabetes Med Glucophage (Metformin) on the day of the procedure and HOLD 48 HOURS AFTER THE PROCEDURE.   HOLD your Jardiance the morning of your test   HOLD your Glimepiride the morning of your test    On the morning of your procedure, take your Aspirin and your plavix and any morning medicines NOT listed above.  You may use sips of water.  5. Plan for one night stay--bring personal belongings. 6. Bring a current list of your medications and current insurance cards. 7. You MUST have a responsible person to drive you home. 8. Someone MUST be with you the first 24 hours after you arrive home or your discharge will be delayed. 9. Please  wear clothes that are easy to get on and off and wear slip-on shoes.  Thank you for allowing [place] to care for you!   -- [facility] Cardiovascular services       Signed, Daune Perch, NP  11/10/2018 [place]

## 2018-11-10 NOTE — Progress Notes (Signed)
Cardiology Office Note:    Date:  11/10/2018   ID:  Christian Mccall, DOB May 14, 1956, MRN [id number]  PCP:  Shon Baton, MD  Cardiologist:  Sinclair Grooms, MD  Referring MD: Shon Baton, MD   Chief Complaint  Patient presents with  . Chest Pain    History of Present Illness:    Christian Mccall is a 63 y.o. male with a past medical history significant for CAD s/p CABG 2002, abdominal aortic aneurysm with endovascular repair and known endoleak 2012, severe COPD with use of nocturnal oxygen, type 2 diabetes, prior myocardial infarction, history of CVA, obstructive sleep apnea on oxygen at night, essential hypertension and hyperlipidemia.  He has had long standing chest pain.   Pt called on 5/29 with c/o CP requiring multiple uses of NTG and issues with shortness of breath. Dr. Tamala Julian arranged for in office visit and pt may need cath.   Christian Mccall is here today for further evaluation and his wife is present via phone. Christian Mccall has had long standing left lateral chest pain but is now having central substernal chest pain. Sometimes it lasts up to an hour. He has tried SL NTG, but it causes such an extreme headache that it makes it hard to tell if it helps. Early last week he did take NTG that helped after the second dose then the pain returned so he took a third tablet which then resolved his pain. Sometimes his chest pain occurs with activity or sometimes when sitting down watching TV in the evening. He does get short of breath with the pain. He has had lightheadedness for several years, no worse lately.   Recently on occasion his chest pain goes to his left elbow and down to his wrist. This has concerned him and his wife. He and his wife are concerned because his chest pain is different than it has been in the past.    Past Medical History:  Diagnosis Date  . AAA (abdominal aortic aneurysm) ([place])   . Anginal pain ([place])   . Bulging lumbar disc    "& some in my neck"  . C. difficile  colitis    2008 OR 09  . CAD (coronary artery disease)   . Change in platelet count    pt states 'has been low since 2012; patient had to receive platelets in surgery in Nov 2016'  . Chronic back pain   . Cirrhosis ([place])   . COPD (chronic obstructive pulmonary disease) ([place])   . Diabetic peripheral neuropathy ([place])   . Emphysema lung ([place])   . Enlarged liver   . Enlarged prostate   . GERD (gastroesophageal reflux disease)   . Hardening of the arteries of the brain   . Headache    "~ weekly" (04/12/2015)  . Heart attack  [facility]) Nov. 15,2016   TIA  . History of colon polyps   . Hyperlipidemia   . Hypertension   . Leg pain    with walking and pain in feet while lying flat  . Memory loss    Due to hardening of the arteries in the brain  . Myocardial infarction ([place])    "I've had probably 6-7" (04/12/2015)  . Restless leg syndrome   . Rheumatic fever    AS A CHILD  ?6 YRS OLD  . RLS (restless legs syndrome)   . Shortness of breath 12/06/10   while lying flat and with exertion  . Snoring   . Spleen enlarged   .  Stroke [facility]) 2004   denies residual on 04/12/2015  . Thrombocytopenia ([place])    since 2012; can be as low as 54K.   [name] TIA (transient ischemic attack) "several"   "since 2015" (04/12/2015)  . Type II diabetes mellitus ([place]) dx'd 2009    Past Surgical History:  Procedure Laterality Date  . ABDOMINAL AORTIC ANEURYSM REPAIR  01/16/2011   EVAR  . ABDOMINAL AORTIC ANEURYSM REPAIR  04/12/2015  . CARDIAC CATHETERIZATION  "several"  . COLONOSCOPY    . COLONOSCOPY W/ BIOPSIES    . COLONOSCOPY WITH PROPOFOL N/A 03/21/2016   Procedure: COLONOSCOPY WITH PROPOFOL;  Surgeon: Arta Silence, MD;  Location: [facility] ENDOSCOPY;  Service: Endoscopy;  Laterality: N/A;  . CORONARY ANGIOPLASTY WITH STENT PLACEMENT  "several"   > 4 sents  . CORONARY ARTERY BYPASS GRAFT  02/18/2001   "CABG x 4"  . Direct sac puncture with liquid embolic repair of Type II endoleak  04/12/2015; 03/05/2017  .  ESOPHAGOGASTRODUODENOSCOPY (EGD) WITH PROPOFOL N/A 03/21/2016   Procedure: ESOPHAGOGASTRODUODENOSCOPY (EGD) WITH PROPOFOL;  Surgeon: Arta Silence, MD;  Location: [facility] ENDOSCOPY;  Service: Endoscopy;  Laterality: N/A;  . IR AORTAGRAM ABDOMINAL SERIALOGRAM  03/05/2017  . IR CT SPINE LTD  03/05/2017  . IR EMBO ARTERIAL NOT HEMORR HEMANG INC GUIDE ROADMAPPING  03/05/2017  . IR RADIOLOGIST EVAL & MGMT  02/05/2017  . IR RADIOLOGIST EVAL & MGMT  04/01/2017  . IR RADIOLOGIST EVAL & MGMT  10/01/2017  . PERIPHERAL VASCULAR CATHETERIZATION N/A 03/10/2015   Procedure: Abdominal Aortogram;  Surgeon: Elam Dutch, MD;  Location: [place] CV LAB;  Service: Cardiovascular;  Laterality: N/A;  . RADIOLOGY WITH ANESTHESIA N/A 04/12/2015   Procedure: embolization;  Surgeon: Jacqulynn Cadet, MD;  Location: [place];  Service: Radiology;  Laterality: N/A;  . RADIOLOGY WITH ANESTHESIA N/A 03/05/2017   Procedure: Type II Endoleak Repair;  Surgeon: Jacqulynn Cadet, MD;  Location: [place];  Service: Radiology;  Laterality: N/A;  . SHOULDER SURGERY Right 07-27-2015  . TONSILLECTOMY  ~ 1965    Current Medications: Current Meds  Medication Sig  . albuterol (PROVENTIL HFA;VENTOLIN HFA) 108 (90 BASE) MCG/ACT inhaler Inhale 2 puffs into the lungs every 6 (six) hours as needed for wheezing or shortness of breath.   [name] aspirin EC 81 MG tablet Take 1 tablet (81 mg total) by mouth daily.  . Cholecalciferol (VITAMIN D) 2000 units tablet Take 2,000 Units by mouth 2 (two) times daily.   . clonazePAM (KLONOPIN) 1 MG tablet Take 1 mg by mouth 2 (two) times daily.   . clopidogrel (PLAVIX) 75 MG tablet Take 1 tablet (75 mg total) by mouth daily.  . cyanocobalamin (,VITAMIN B-12,) 1000 MCG/ML injection Inject 1,000 mcg into the muscle See admin instructions. Every 30 to 60 days  . empagliflozin (JARDIANCE) 10 MG TABS tablet Take 10 mg by mouth daily.  [name] escitalopram (LEXAPRO) 20 MG tablet Take 20 mg by mouth at bedtime.  [name]  ezetimibe-simvastatin (VYTORIN) 10-40 MG per tablet Take 1 tablet by mouth at bedtime.   . fenofibrate 160 MG tablet Take 160 mg by mouth daily.   . Fluticasone-Salmeterol (ADVAIR) 100-50 MCG/DOSE AEPB Inhale 1 puff into the lungs 2 (two) times daily.   . furosemide (LASIX) 40 MG tablet Take 40 mg by mouth every other day.   [name] glimepiride (AMARYL) 4 MG tablet Take 4 mg by mouth every evening.   . lansoprazole (PREVACID) 30 MG capsule Take 30 mg by mouth daily.   [name]  LANTUS SOLOSTAR 100 UNIT/ML Solostar Pen Inject 17 Units into the skin at bedtime.   [name] losartan (COZAAR) 50 MG tablet Take 50 mg by mouth daily.    . metFORMIN (GLUCOPHAGE) 1000 MG tablet Take 1,000 mg by mouth daily with breakfast.   . metoprolol (LOPRESSOR) 50 MG tablet Take 50 mg by mouth 2 (two) times daily.   . nitroGLYCERIN (NITROSTAT) 0.4 MG SL tablet Place 1 tablet (0.4 mg total) under the tongue every 5 (five) minutes as needed for chest pain.  [name] oxyCODONE-acetaminophen (PERCOCET) 5-325 MG tablet Take 1 tablet by mouth every 4 (four) hours as needed (max 6 q).  . OXYGEN Inhale into the lungs. 2L at night  . pramipexole (MIRAPEX) 0.25 MG tablet Take 0.25 mg by mouth at bedtime.   . pregabalin (LYRICA) 100 MG capsule Take 100-200 mg by mouth See admin instructions. Take 1 tablet (100 mg) by mouth every morning and 2 tablets (200 mg) at bedtime  . ranolazine (RANEXA) 1000 MG SR tablet Take 1,000 mg by mouth 2 (two) times daily.    [name] terazosin (HYTRIN) 1 MG capsule Take 1 mg by mouth at bedtime.   [name] tiotropium (SPIRIVA) 18 MCG inhalation capsule Place 18 mcg into inhaler and inhale daily.      Allergies:   Patient has no known allergies.   Social History   Socioeconomic History  . Marital status: Married    Spouse name: reba  . Number of children: 2  . Years of education: 9  . Highest education level: Not on file  Occupational History  . Occupation: disability  Social Needs  . Financial resource strain: Not on file  .  Food insecurity:    Worry: Not on file    Inability: Not on file  . Transportation needs:    Medical: Not on file    Non-medical: Not on file  Tobacco Use  . Smoking status: Former Smoker    Packs/day: 0.00    Years: 30.00    Pack years: 0.00    Types: Cigarettes    Last attempt to quit: 02/17/2001    Years since quitting: 17.7  . Smokeless tobacco: Never Used  Substance and Sexual Activity  . Alcohol use: Yes    Alcohol/week: 1.0 standard drinks    Types: 1 Cans of beer per week    Comment: occasional  . Drug use: No  . Sexual activity: Not Currently  Lifestyle  . Physical activity:    Days per week: Not on file    Minutes per session: Not on file  . Stress: Not on file  Relationships  . Social connections:    Talks on phone: Not on file    Gets together: Not on file    Attends religious service: Not on file    Active member of club or organization: Not on file    Attends meetings of clubs or organizations: Not on file    Relationship status: Not on file  Other Topics Concern  . Not on file  Social History Narrative   Patient lives at home with his wife.      Family History: The patient's family history includes Aneurysm in his maternal aunt, maternal uncle, and mother; Cancer in his sister; Heart attack in his brother, brother, and brother; Heart disease in his brother, brother, mother, and sister; Hyperlipidemia in his father and mother; Hypertension in his brother, father, and mother; Lupus in his sister; Other in his maternal aunt, maternal uncle, and  mother. ROS:   Please see the history of present illness.     All other systems reviewed and are negative.  EKGs/Labs/Other Studies Reviewed:    The following studies were reviewed today:   EKG:  EKG is ordered today.  The ekg ordered today demonstrates NSR, 63 bpm, no ischemic changes.  Recent Labs: 03/16/2018: ALT 40; BUN 8; Creatinine, Ser 0.76; Hemoglobin 15.9; Platelets 70; Potassium 4.4; Sodium 132    Recent Lipid Panel    Component Value Date/Time   CHOL 129 05/02/2013 0410   TRIG 230 (H) 05/02/2013 0410   HDL 69 05/02/2013 0410   CHOLHDL 1.9 05/02/2013 0410   VLDL 46 (H) 05/02/2013 0410   LDLCALC 14 05/02/2013 0410    Physical Exam:    VS:  BP 132/66   Pulse 63   Ht 5\' 11"  (1.803 m)   Wt 211 lb (95.7 kg)   SpO2 95%   BMI 29.43 kg/m     Wt Readings from Last 3 Encounters:  11/10/18 211 lb (95.7 kg)  10/08/18 202 lb (91.6 kg)  03/19/18 205 lb 0.4 oz (93 kg)     Physical Exam  Constitutional: He is oriented to person, place, and time. He appears well-nourished.  Cardiovascular: Normal rate, regular rhythm, normal heart sounds and intact distal pulses. Exam reveals no gallop and no friction rub.  No murmur heard. Pulmonary/Chest: Effort normal and breath sounds normal. No respiratory distress. He has no wheezes. He has no rales.  Abdominal: Soft. Bowel sounds are normal.  Musculoskeletal: Normal range of motion.        General: No deformity or edema.  Neurological: He is alert and oriented to person, place, and time.  Skin: Skin is warm and dry.  Psychiatric: He has a normal mood and affect. His behavior is normal. Judgment and thought content normal.  Vitals reviewed.   ASSESSMENT:    1. Unstable angina ([place])   2. Essential (primary) hypertension   3. Hyperlipidemia, unspecified hyperlipidemia type   4. Coronary artery disease involving native coronary artery of native heart with unstable angina pectoris ([place])   5. Type 2 diabetes mellitus with complication, without long-term current use of insulin (HCC)   6. Abdominal aortic aneurysm (AAA) without rupture ([place])    PLAN:    In order of problems listed above:  1. Unstable angina -Pt with intermittent chest pain and shortness of breath, reportedly different from his usual longstanding chest pain.  Becoming more frequent. Responsive to SL NTG.  -Conitnues on aspirin 81 mg, plavix, betablocker, ARB, ranolazine  -Will arrange for left heart cath to evaluate coronary arteries and grafts. To be done on Friday with Dr. Tamala Julian. -Will need COVID 19 testing prior to procedure. -Will check CBC, BMet. Pt has history of low platelet count, followed by Heme/Onc. Per Dr. Tamala Julian, cath can be done as long as platelet count > 50k. If lower, would need to defer cath until seen by Heme/onc and platelet count improved. Pt aware. -The patient understands that risks include but are not limited to stroke (1 in 1000), death (1 in 55), kidney failure [usually temporary] (1 in 500), bleeding (1 in 200), allergic reaction [possibly serious] (1 in 200).  He and his wife wish to proceed.   2. Hypertension -BP well controlled.   3. Hyperlipidemia with goal LDL<70 -On ezetimibe-simvastatin 10-40mg , fenofibrate -Last lipid panel in 04/2018 per [facility]: TC 121, LDL 25, HDL36.. Triglycerides up to 299- hopefully will improve with better blood sugar control.  -  Continue current therapy.   4. Diabetes type 2 on insulin -On lantus insulin, amaryl, jardiance, metformin. -Will hold metformin on day of procedure and for 48 hrs after.  -A1c was up to 11, insulin increased and pt reports that A1c is now down.   5. Abdominal aortic aneurysm s/p EVAR graft with known endo-leak -Needs follow up with Dr. Oneida Alar. I will request a follow up appt. (we will send a message and his wife will also call the office)   Medication Adjustments/Labs and Tests Ordered: Current medicines are reviewed at length with the patient today.  Concerns regarding medicines are outlined above. Labs and tests ordered and medication changes are outlined in the patient instructions below:  Patient Instructions  Medication Instructions:  Your physician recommends that you continue on your current medications as directed. Please refer to the Current Medication list given to you today.  If you need a refill on your cardiac medications before your next appointment, please  call your pharmacy.   Lab work: TODAY: BMET & CBC  Your Pre-procedure COVID-19 Testing will be done on 11/10/2018 at [place] at [address] [place], [place] [address] After your swab you will be given a mask to wear and instructed to go home and quarantine/no visitors until after your procedure. If you test positive you will be notified and your procedure will be cancelled.   If you have labs (blood work) drawn today and your tests are completely normal, you will receive your results only by: [name] MyChart Message (if you have MyChart) OR . A paper copy in the mail If you have any lab test that is abnormal or we need to change your treatment, we will call you to review the results.  Testing/Procedures: Your physician has requested that you have a cardiac catheterization. Cardiac catheterization is used to diagnose and/or treat various heart conditions. Doctors may recommend this procedure for a number of different reasons. The most common reason is to evaluate chest pain. Chest pain can be a symptom of coronary artery disease (CAD), and cardiac catheterization can show whether plaque is narrowing or blocking your heart's arteries. This procedure is also used to evaluate the valves, as well as measure the blood flow and oxygen levels in different parts of your heart. For further information please visit HugeFiesta.[email or web address]. Please follow instruction sheet, as given.   Follow-Up: At [facility], you and your health needs are our priority.  As part of our continuing mission to provide you with exceptional heart care, we have created designated Provider Care Teams.  These Care Teams include your primary Cardiologist (physician) and Advanced Practice Providers (APPs -  Physician Assistants and Nurse Practitioners) who all work together to provide you with the care you need, when you need it. You will need a follow up appointment in 2 weeks.  You may  see Sinclair Grooms, MD or one of the following Advanced Practice Providers on your designated Care Team:   Truitt Merle, NP Cecilie Kicks, NP . Kathyrn Drown, NP  Any Other Special Instructions Will Be Listed Below (If Applicable).    [place] OFFICE [place], [place] [address] Dept: [phone] Loc: [place]  11/10/2018  You are scheduled for a Cardiac Catheterization on Friday, June 5 with Dr. Daneen Schick.  1. Please arrive at the [facility] (Main Entrance A) at [facility]  [facility]: [address] [place], [place] [address] at 8:30 AM (This time is two hours before your procedure to ensure your preparation). Free valet parking service is available.   Special note: Every effort is made to have your procedure done on time. Please understand that emergencies sometimes delay scheduled procedures.  2. Diet: Do not eat solid foods after midnight.  The patient may have clear liquids until 5am upon the day of the procedure.   4. Medication instructions in preparation for your procedure:   Contrast Allergy: No  HOLD your lasix the morning of your procedure   Take half of your insulin the night before your test  Do not take Diabetes Med Glucophage (Metformin) on the day of the procedure and HOLD 48 HOURS AFTER THE PROCEDURE.   HOLD your Jardiance the morning of your test   HOLD your Glimepiride the morning of your test    On the morning of your procedure, take your Aspirin and your plavix and any morning medicines NOT listed above.  You may use sips of water.  5. Plan for one night stay--bring personal belongings. 6. Bring a current list of your medications and current insurance cards. 7. You MUST have a responsible person to drive you home. 8. Someone MUST be with you the first 24 hours after you arrive home or your discharge will be delayed. 9. Please  wear clothes that are easy to get on and off and wear slip-on shoes.  Thank you for allowing [place] to care for you!   -- [facility] Invasive Cardiovascular services       Signed, Daune Perch, NP  11/10/2018 [place]

## 2018-11-10 NOTE — Patient Instructions (Addendum)
Medication Instructions:  Your physician recommends that you continue on your current medications as directed. Please refer to the Current Medication list given to you today.  If you need a refill on your cardiac medications before your next appointment, please call your pharmacy.   Lab work: TODAY: BMET & CBC  Your Pre-procedure COVID-19 Testing will be done on 11/10/2018 at [place] at [address] [place], [place] [address] After your swab you will be given a mask to wear and instructed to go home and quarantine/no visitors until after your procedure. If you test positive you will be notified and your procedure will be cancelled.   If you have labs (blood work) drawn today and your tests are completely normal, you will receive your results only by: [name] MyChart Message (if you have MyChart) OR . A paper copy in the mail If you have any lab test that is abnormal or we need to change your treatment, we will call you to review the results.  Testing/Procedures: Your physician has requested that you have a cardiac catheterization. Cardiac catheterization is used to diagnose and/or treat various heart conditions. Doctors may recommend this procedure for a number of different reasons. The most common reason is to evaluate chest pain. Chest pain can be a symptom of coronary artery disease (CAD), and cardiac catheterization can show whether plaque is narrowing or blocking your heart's arteries. This procedure is also used to evaluate the valves, as well as measure the blood flow and oxygen levels in different parts of your heart. For further information please visit HugeFiesta.[email or web address]. Please follow instruction sheet, as given.   Follow-Up: At [facility], you and your health needs are our priority.  As part of our continuing mission to provide you with exceptional heart care, we have created designated Provider Care Teams.  These Care Teams include  your primary Cardiologist (physician) and Advanced Practice Providers (APPs -  Physician Assistants and Nurse Practitioners) who all work together to provide you with the care you need, when you need it. You will need a follow up appointment in 2 weeks.  You may see Sinclair Grooms, MD or one of the following Advanced Practice Providers on your designated Care Team:   Truitt Merle, NP Cecilie Kicks, NP . Kathyrn Drown, NP  Any Other Special Instructions Will Be Listed Below (If Applicable).    [place] OFFICE [place], [place] [address] Dept: [phone] Loc: [place]  11/10/2018  You are scheduled for a Cardiac Catheterization on Friday, June 5 with Dr. Daneen Schick.  1. Please arrive at the [facility] (Main Entrance A) at [facility]: [address] [place], [place] [address] at 8:30 AM (This time is two hours before your procedure to ensure your preparation). Free valet parking service is available.   Special note: Every effort is made to have your procedure done on time. Please understand that emergencies sometimes delay scheduled procedures.  2. Diet: Do not eat solid foods after midnight.  The patient may have clear liquids until 5am upon the day of the procedure.   4. Medication instructions in preparation for your procedure:   Contrast Allergy: No  HOLD your lasix the morning of your procedure   Take half of your insulin the night before your test  Do not take Diabetes Med Glucophage (Metformin) on the day of  the procedure and HOLD 48 HOURS AFTER THE PROCEDURE.   HOLD your Jardiance the morning of your test   HOLD your Glimepiride the morning of your test    On the morning of your procedure, take your Aspirin and your plavix and any morning medicines NOT listed above.  You may use sips of water.  5. Plan for one night stay--bring  personal belongings. 6. Bring a current list of your medications and current insurance cards. 7. You MUST have a responsible person to drive you home. 8. Someone MUST be with you the first 24 hours after you arrive home or your discharge will be delayed. 9. Please wear clothes that are easy to get on and off and wear slip-on shoes.  Thank you for allowing [place] to care for you!   -- [place] Invasive Cardiovascular services

## 2018-11-11 LAB — BASIC METABOLIC PANEL
BUN/Creatinine Ratio: 17 (ref 10–24)
BUN: 18 mg/dL (ref 8–27)
CO2: 22 mmol/L (ref 20–29)
Calcium: 10.2 mg/dL (ref 8.6–10.2)
Chloride: 101 mmol/L (ref 96–106)
Creatinine, Ser: 1.09 mg/dL (ref 0.76–1.27)
GFR calc Af Amer: 84 mL/min/{1.73_m2} (ref >59)
GFR calc non Af Amer: 72 mL/min/{1.73_m2} (ref >59)
Glucose: 163 mg/dL — ABNORMAL HIGH (ref 65–99)
Potassium: 4.6 mmol/L (ref 3.5–5.2)
Sodium: 141 mmol/L (ref 134–144)

## 2018-11-11 LAB — CBC
Hematocrit: 44.6 % (ref 37.5–51.0)
Hemoglobin: 15.4 g/dL (ref 13.0–17.7)
MCH: 31.8 pg (ref 26.6–33.0)
MCHC: 34.5 g/dL (ref 31.5–35.7)
MCV: 92 fL (ref 79–97)
Platelets: 100 10*3/uL — CL (ref 150–450)
RBC: 4.85 x10E6/uL (ref 4.14–5.80)
RDW: 13 % (ref 11.6–15.4)
WBC: 7.6 10*3/uL (ref 3.4–10.8)

## 2018-11-11 LAB — NOVEL CORONAVIRUS, NAA (HOSP ORDER, SEND-OUT TO REF LAB; TAT 18-24 HRS): SARS-CoV-2, NAA: NOT DETECTED

## 2018-11-12 ENCOUNTER — Telehealth: Payer: Self-pay | Admitting: *Deleted

## 2018-11-12 NOTE — Telephone Encounter (Signed)
Pt contacted pre-catheterization scheduled at [facility] for: Friday June 5,2020 10:30 AM Verified arrival time and place: [facility] Entrance A at: 8:30 AM  Covid-19 test date:11/10/18 not detected  No solid food after midnight prior to cath, clear liquids until 5 AM day of procedure. Contrast allergy: no  Hold; Jardiance -AM of procedure. Glimepiride-AM of procedure. Metformin-day of procedure and 48 hours post procedure. Insulin-1/2 PM prior to  procedure. Furosemide-AM of procedure.  Except hold medications AM meds can be  taken pre-cath with sip of water including: ASA 81 mg Plavix 75 mg   Confirmed patient has responsible person to drive home post procedure and observe 24 hours after arriving home: yes  Due to Covid-19 pandemic no visitors are allowed in the hospital (unless cognitive impairment).  Their designated party will be called when their procedure is over for an update and to arrange pick up.  Patients are required to wear a mask when they enter the hospital.      COVID-19 Pre-Screening Questions:  . In the past 7 to 10 days have you had a cough,  shortness of breath, headache, congestion, fever (100 or greater) body aches, chills, sore throat, or sudden loss of taste or sense of smell? no . Have you been around anyone with known Covid 19? no . Have  you been around anyone who is awaiting Covid 19 test results in the past 7 to 10 days? no . Have you been around anyone who has been exposed to Covid 19, or has mentioned symptoms of Covid 19 within the past 7 to 10 days? no  I reviewed procedure/mask/visitor/Covid 19 screening questions with patient, he verbalized understanding.

## 2018-11-13 ENCOUNTER — Other Ambulatory Visit: Payer: Self-pay

## 2018-11-13 ENCOUNTER — Ambulatory Visit (HOSPITAL_COMMUNITY)
Admission: RE | Admit: 2018-11-13 | Discharge: 2018-11-13 | Disposition: A | Discharge status: Home / Self Care | Payer: Medicare HMO | Attending: Interventional Cardiology | Admitting: Interventional Cardiology

## 2018-11-13 ENCOUNTER — Encounter (HOSPITAL_COMMUNITY)
Admission: RE | Disposition: A | Discharge status: Home / Self Care | Payer: Self-pay | Source: Home / Self Care | Attending: Interventional Cardiology

## 2018-11-13 DIAGNOSIS — J449 Chronic obstructive pulmonary disease, unspecified: Secondary | ICD-10-CM | POA: Diagnosis not present

## 2018-11-13 DIAGNOSIS — I25110 Atherosclerotic heart disease of native coronary artery with unstable angina pectoris: Secondary | ICD-10-CM | POA: Diagnosis not present

## 2018-11-13 DIAGNOSIS — Z79899 Other long term (current) drug therapy: Secondary | ICD-10-CM | POA: Diagnosis not present

## 2018-11-13 DIAGNOSIS — E785 Hyperlipidemia, unspecified: Secondary | ICD-10-CM | POA: Insufficient documentation

## 2018-11-13 DIAGNOSIS — I25810 Atherosclerosis of coronary artery bypass graft(s) without angina pectoris: Secondary | ICD-10-CM | POA: Diagnosis not present

## 2018-11-13 DIAGNOSIS — Z7902 Long term (current) use of antithrombotics/antiplatelets: Secondary | ICD-10-CM | POA: Insufficient documentation

## 2018-11-13 DIAGNOSIS — E1142 Type 2 diabetes mellitus with diabetic polyneuropathy: Secondary | ICD-10-CM | POA: Diagnosis not present

## 2018-11-13 DIAGNOSIS — I25708 Atherosclerosis of coronary artery bypass graft(s), unspecified, with other forms of angina pectoris: Secondary | ICD-10-CM | POA: Diagnosis present

## 2018-11-13 DIAGNOSIS — I714 Abdominal aortic aneurysm, without rupture, unspecified: Secondary | ICD-10-CM | POA: Diagnosis present

## 2018-11-13 DIAGNOSIS — Z7982 Long term (current) use of aspirin: Secondary | ICD-10-CM | POA: Diagnosis not present

## 2018-11-13 DIAGNOSIS — K219 Gastro-esophageal reflux disease without esophagitis: Secondary | ICD-10-CM | POA: Insufficient documentation

## 2018-11-13 DIAGNOSIS — Z87891 Personal history of nicotine dependence: Secondary | ICD-10-CM | POA: Insufficient documentation

## 2018-11-13 DIAGNOSIS — K7460 Unspecified cirrhosis of liver: Secondary | ICD-10-CM | POA: Diagnosis not present

## 2018-11-13 DIAGNOSIS — I2582 Chronic total occlusion of coronary artery: Secondary | ICD-10-CM | POA: Insufficient documentation

## 2018-11-13 DIAGNOSIS — I10 Essential (primary) hypertension: Secondary | ICD-10-CM | POA: Diagnosis not present

## 2018-11-13 DIAGNOSIS — Z794 Long term (current) use of insulin: Secondary | ICD-10-CM | POA: Insufficient documentation

## 2018-11-13 DIAGNOSIS — R413 Other amnesia: Secondary | ICD-10-CM | POA: Diagnosis not present

## 2018-11-13 DIAGNOSIS — Z9981 Dependence on supplemental oxygen: Secondary | ICD-10-CM | POA: Insufficient documentation

## 2018-11-13 DIAGNOSIS — G4733 Obstructive sleep apnea (adult) (pediatric): Secondary | ICD-10-CM | POA: Diagnosis not present

## 2018-11-13 DIAGNOSIS — N400 Enlarged prostate without lower urinary tract symptoms: Secondary | ICD-10-CM | POA: Diagnosis not present

## 2018-11-13 DIAGNOSIS — I2584 Coronary atherosclerosis due to calcified coronary lesion: Secondary | ICD-10-CM | POA: Insufficient documentation

## 2018-11-13 DIAGNOSIS — I252 Old myocardial infarction: Secondary | ICD-10-CM | POA: Insufficient documentation

## 2018-11-13 DIAGNOSIS — Z8249 Family history of ischemic heart disease and other diseases of the circulatory system: Secondary | ICD-10-CM | POA: Insufficient documentation

## 2018-11-13 DIAGNOSIS — G2581 Restless legs syndrome: Secondary | ICD-10-CM | POA: Insufficient documentation

## 2018-11-13 DIAGNOSIS — Z951 Presence of aortocoronary bypass graft: Secondary | ICD-10-CM | POA: Insufficient documentation

## 2018-11-13 DIAGNOSIS — Z7951 Long term (current) use of inhaled steroids: Secondary | ICD-10-CM | POA: Insufficient documentation

## 2018-11-13 DIAGNOSIS — Z8619 Personal history of other infectious and parasitic diseases: Secondary | ICD-10-CM | POA: Insufficient documentation

## 2018-11-13 DIAGNOSIS — Z955 Presence of coronary angioplasty implant and graft: Secondary | ICD-10-CM | POA: Insufficient documentation

## 2018-11-13 DIAGNOSIS — Z8673 Personal history of transient ischemic attack (TIA), and cerebral infarction without residual deficits: Secondary | ICD-10-CM | POA: Insufficient documentation

## 2018-11-13 HISTORY — PX: LEFT HEART CATH AND CORS/GRAFTS ANGIOGRAPHY: CATH118250

## 2018-11-13 LAB — GLUCOSE, CAPILLARY
Glucose-Capillary: 198 mg/dL — ABNORMAL HIGH (ref 70–99)
Glucose-Capillary: 143 mg/dL — ABNORMAL HIGH (ref 70–99)

## 2018-11-13 SURGERY — LEFT HEART CATH AND CORS/GRAFTS ANGIOGRAPHY
Anesthesia: LOCAL

## 2018-11-13 MED ORDER — HYDRALAZINE HCL 20 MG/ML IJ SOLN: 10.0000 mg | INTRAMUSCULAR | Status: DC | PRN

## 2018-11-13 MED ORDER — SODIUM CHLORIDE 0.9% FLUSH: 3.0000 mL | INTRAVENOUS | Status: DC | PRN

## 2018-11-13 MED ORDER — SODIUM CHLORIDE 0.9 % WEIGHT BASED INFUSION: 1.0000 mL/kg/h | INTRAVENOUS | Status: DC

## 2018-11-13 MED ORDER — HEPARIN SODIUM (PORCINE) 1000 UNIT/ML IJ SOLN
INTRAMUSCULAR | Status: DC | PRN
  Administered 2018-11-13: 5000 [IU] via INTRAVENOUS

## 2018-11-13 MED ORDER — HEPARIN (PORCINE) IN NACL 1000-0.9 UT/500ML-% IV SOLN
INTRAVENOUS | Status: AC
  Filled 2018-11-13: qty 1500

## 2018-11-13 MED ORDER — SODIUM CHLORIDE 0.9 % WEIGHT BASED INFUSION
3.0000 mL/kg/h | INTRAVENOUS | Status: AC
  Administered 2018-11-13: 10:00:00 3 mL/kg/h via INTRAVENOUS

## 2018-11-13 MED ORDER — HEPARIN SODIUM (PORCINE) 1000 UNIT/ML IJ SOLN
INTRAMUSCULAR | Status: AC
  Filled 2018-11-13: qty 1

## 2018-11-13 MED ORDER — ONDANSETRON HCL 4 MG/2ML IJ SOLN: 4.0000 mg | Freq: Four times a day (QID) | INTRAMUSCULAR | Status: DC | PRN

## 2018-11-13 MED ORDER — VERAPAMIL HCL 2.5 MG/ML IV SOLN
INTRAVENOUS | Status: DC | PRN
  Administered 2018-11-13: 13:00:00 10 mL via INTRA_ARTERIAL

## 2018-11-13 MED ORDER — MIDAZOLAM HCL 2 MG/2ML IJ SOLN
INTRAMUSCULAR | Status: DC | PRN
  Administered 2018-11-13: 1 mg via INTRAVENOUS

## 2018-11-13 MED ORDER — SODIUM CHLORIDE 0.9 % IV SOLN: 250.0000 mL | INTRAVENOUS | Status: DC | PRN

## 2018-11-13 MED ORDER — HEPARIN (PORCINE) IN NACL 1000-0.9 UT/500ML-% IV SOLN
INTRAVENOUS | Status: DC | PRN
  Administered 2018-11-13: 500 mL

## 2018-11-13 MED ORDER — FENTANYL CITRATE (PF) 100 MCG/2ML IJ SOLN
INTRAMUSCULAR | Status: DC | PRN
  Administered 2018-11-13: 25 ug via INTRAVENOUS

## 2018-11-13 MED ORDER — IOHEXOL 350 MG/ML SOLN
INTRAVENOUS | Status: DC | PRN
  Administered 2018-11-13: 105 mL via INTRACARDIAC

## 2018-11-13 MED ORDER — FENTANYL CITRATE (PF) 100 MCG/2ML IJ SOLN
INTRAMUSCULAR | Status: AC
  Filled 2018-11-13: qty 2

## 2018-11-13 MED ORDER — SODIUM CHLORIDE 0.9% FLUSH: 3.0000 mL | Freq: Two times a day (BID) | INTRAVENOUS | Status: DC

## 2018-11-13 MED ORDER — LIDOCAINE HCL (PF) 1 % IJ SOLN
INTRAMUSCULAR | Status: DC | PRN
  Administered 2018-11-13: 2 mL

## 2018-11-13 MED ORDER — ACETAMINOPHEN 325 MG PO TABS: 650.0000 mg | ORAL_TABLET | ORAL | Status: DC | PRN

## 2018-11-13 MED ORDER — VERAPAMIL HCL 2.5 MG/ML IV SOLN
INTRAVENOUS | Status: AC
  Filled 2018-11-13: qty 2

## 2018-11-13 MED ORDER — LABETALOL HCL 5 MG/ML IV SOLN: 10.0000 mg | INTRAVENOUS | Status: DC | PRN

## 2018-11-13 MED ORDER — LIDOCAINE HCL (PF) 1 % IJ SOLN
INTRAMUSCULAR | Status: AC
  Filled 2018-11-13: qty 30

## 2018-11-13 MED ORDER — ASPIRIN 81 MG PO CHEW: 81.0000 mg | CHEWABLE_TABLET | ORAL | Status: DC

## 2018-11-13 MED ORDER — SODIUM CHLORIDE 0.9 % IV SOLN: INTRAVENOUS | Status: DC

## 2018-11-13 MED ORDER — MIDAZOLAM HCL 2 MG/2ML IJ SOLN
INTRAMUSCULAR | Status: AC
  Filled 2018-11-13: qty 2

## 2018-11-13 SURGICAL SUPPLY — 12 items
CATH 5FR JL3.5 JR4 ANG PIG MP (CATHETERS) ×1 IMPLANT
COVER DOME SNAP 22 D (MISCELLANEOUS) ×1 IMPLANT
DEVICE RAD COMP TR BAND LRG (VASCULAR PRODUCTS) ×1 IMPLANT
ELECT DEFIB PAD ADLT CADENCE (PAD) ×1 IMPLANT
GLIDESHEATH SLEND A-KIT 6F 22G (SHEATH) ×1 IMPLANT
GUIDEWIRE INQWIRE 1.5J.035X260 (WIRE) IMPLANT
INQWIRE 1.5J .035X260CM (WIRE) ×2
KIT HEART LEFT (KITS) ×2 IMPLANT
PACK CARDIAC CATHETERIZATION (CUSTOM PROCEDURE TRAY) ×2 IMPLANT
SHEATH PROBE COVER 6X72 (BAG) ×1 IMPLANT
TRANSDUCER W/STOPCOCK (MISCELLANEOUS) ×2 IMPLANT
TUBING CIL FLEX 10 FLL-RA (TUBING) ×2 IMPLANT

## 2018-11-13 NOTE — Interval H&P Note (Signed)
History and Physical Interval Note:  11/13/2018 Cath Lab Visit (complete for each Cath Lab visit)  Clinical Evaluation Leading to the Procedure:   ACS: Yes.    Non-ACS:    Anginal Classification: CCS III  Anti-ischemic medical therapy: Maximal Therapy (2 or more classes of medications)  Non-Invasive Test Results: No non-invasive testing performed  Prior CABG: Previous CABG       12:04 PM  Christian Mccall  has presented today for surgery, with the diagnosis of unstable angina.  The various methods of treatment have been discussed with the patient and family. After consideration of risks, benefits and other options for treatment, the patient has consented to  Procedure(s): LEFT HEART CATH AND CORS/GRAFTS ANGIOGRAPHY (N/A) as a surgical intervention.  The patient's history has been reviewed, patient examined, no change in status, stable for surgery.  I have reviewed the patient's chart and labs.  Questions were answered to the patient's satisfaction.     [name]

## 2018-11-13 NOTE — CV Procedure (Signed)
   Diagnostic coronary angiography, bypass graft angiography, and left ventriculography via right radial approach.  Real-time vascular ultrasound was used for access.  Diffuse proximal to distal native vessel coronary calcification consistent with Monckeberg sclerosis  Totally occluded RCA, totally occluded proximal LAD, patent circumflex with 70% mid vessel stenosis and 70% diffuse stenosis in the second obtuse marginal.  Patent bypass graft to the distal RCA  Patent bypass graft to the diagonal that retro-fills the mid to distal LAD.  The mid graft contains eccentric 40% narrowing  Patent saphenous vein graft to the first obtuse marginal  Atretic LIMA  Continue medical therapy as a recommendation.  Aggressive secondary risk factor modification.

## 2018-11-13 NOTE — Discharge Instructions (Signed)
Radial Site Care ° °This sheet gives you information about how to care for yourself after your procedure. Your health care provider may also give you more specific instructions. If you have problems or questions, contact your health care provider. °What can I expect after the procedure? °After the procedure, it is common to have: °· Bruising and tenderness at the catheter insertion area. °Follow these instructions at home: °Medicines °· Take over-the-counter and prescription medicines only as told by your health care provider. °Insertion site care °· Follow instructions from your health care provider about how to take care of your insertion site. Make sure you: °? Wash your hands with soap and water before you change your bandage (dressing). If soap and water are not available, use hand sanitizer. °? Change your dressing as told by your health care provider. °? Leave stitches (sutures), skin glue, or adhesive strips in place. These skin closures may need to stay in place for 2 weeks or longer. If adhesive strip edges start to loosen and curl up, you may trim the loose edges. Do not remove adhesive strips completely unless your health care provider tells you to do that. °· Check your insertion site every day for signs of infection. Check for: °? Redness, swelling, or pain. °? Fluid or blood. °? Pus or a bad smell. °? Warmth. °· Do not take baths, swim, or use a hot tub until your health care provider approves. °· You may shower 24-48 hours after the procedure, or as directed by your health care provider. °? Remove the dressing and gently wash the site with plain soap and water. °? Pat the area dry with a clean towel. °? Do not rub the site. That could cause bleeding. °· Do not apply powder or lotion to the site. °Activity ° °· For 24 hours after the procedure, or as directed by your health care provider: °? Do not flex or bend the affected arm. °? Do not push or pull heavy objects with the affected arm. °? Do not  drive yourself home from the hospital or clinic. You may drive 24 hours after the procedure unless your health care provider tells you not to. °? Do not operate machinery or power tools. °· Do not lift anything that is heavier than 10 lb (4.5 kg), or the limit that you are told, until your health care provider says that it is safe. °· Ask your health care provider when it is okay to: °? Return to work or school. °? Resume usual physical activities or sports. °? Resume sexual activity. °General instructions °· If the catheter site starts to bleed, raise your arm and put firm pressure on the site. If the bleeding does not stop, get help right away. This is a medical emergency. °· If you went home on the same day as your procedure, a responsible adult should be with you for the first 24 hours after you arrive home. °· Keep all follow-up visits as told by your health care provider. This is important. °Contact a health care provider if: °· You have a fever. °· You have redness, swelling, or yellow drainage around your insertion site. °Get help right away if: °· You have unusual pain at the radial site. °· The catheter insertion area swells very fast. °· The insertion area is bleeding, and the bleeding does not stop when you hold steady pressure on the area. °· Your arm or hand becomes pale, cool, tingly, or numb. °These symptoms may represent a serious problem   that is an emergency. Do not wait to see if the symptoms will go away. Get medical help right away. Call your local emergency services (911 in the [place] Do not drive yourself to the hospital. °Summary °· After the procedure, it is common to have bruising and tenderness at the site. °· Follow instructions from your health care provider about how to take care of your radial site wound. Check the wound every day for signs of infection. °· Do not lift anything that is heavier than 10 lb (4.5 kg), or the limit that you are told, until your health care provider says  that it is safe. °This information is not intended to replace advice given to you by your health care provider. Make sure you discuss any questions you have with your health care provider. °Document Released: 06/29/2010 Document Revised: 07/02/2017 Document Reviewed: 07/02/2017 °Elsevier Interactive Patient Education © 2019 [employer] ° °

## 2018-11-16 ENCOUNTER — Encounter (HOSPITAL_COMMUNITY): Payer: Self-pay | Admitting: Interventional Cardiology

## 2018-12-04 ENCOUNTER — Telehealth: Payer: Self-pay | Admitting: Cardiology

## 2018-12-04 NOTE — Telephone Encounter (Signed)
New Message     COVID SCREENING QUESTIONS FOR IN-OFFICE VISITS - PLEASE DOCUMENT PATIENT ANSWERS  1. Do you currently have a fever?  A NO  2. Have you recently traveled on a cruise, internationally, or to [place], [place], [place], [place], [place], or [place], [place] [employer])?  a. NO  3. Have you been in contact with someone that is currently pending confirmation of Covid19 testing or has been confirmed to have the [place] virus?  a. NO  4. Are you currently experiencing fatigue or cough?  a. NO

## 2018-12-06 NOTE — Progress Notes (Signed)
Cardiology Office Note   Date:  12/07/2018   ID:  Christian Mccall, DOB 1955-06-27, MRN [id number]  PCP:  Shon Baton, MD  Cardiologist:  Dr. Tamala Julian     Chief Complaint  Patient presents with  . Hospitalization Follow-up      History of Present Illness: Christian Mccall is a 63 y.o. male who presents for post hospitalization for cath.  His past medical history significant for CAD s/p CABG 2002, abdominal aortic aneurysm with endovascular repair and known endoleak2012, severe COPD with use of nocturnal oxygen, type 2 diabetes, prior myocardial infarction, history of CVA, obstructive sleep apnea on oxygen at night, essential hypertension and hyperlipidemia.  He has had long standing chest pain.   Pt called on 5/29 with c/o CP requiring multiple uses of NTG and issues with shortness of breath. Dr. Tamala Julian arranged for in office visit and pt may need cath.   On last visit Christian Mccall has had long standing left lateral chest pain but is now having central substernal chest pain. Sometimes it lasts up to an hour. He has tried SL NTG, but it causes such an extreme headache that it makes it hard to tell if it helps. Early last week he did take NTG that helped after the second dose then the pain returned so he took a third tablet which then resolved his pain. Sometimes his chest pain occurs with activity or sometimes when sitting down watching TV in the evening. He does get short of breath with the pain. He has had lightheadedness for several years, no worse lately.   Recently on occasion his chest pain goes to his left elbow and down to his wrist. This has concerned him and his wife. He and his wife are concerned because his chest pain is different than it has been in the past.  due to changes in angina cardiac cath was arranged.    Cardiac cath was done 11/13/18.  up titrated meds his vein grafts were open though LIMA with atresia or total occlusion diffuse heavy native vessel disease consistent  with Monckeberg's sclerosis.    Today still with chest pain, different than when he had cath.  He is on ranexa - in the past he had NTG patch and did well.  Has headaches with SL NTG.   Today he woke with pain and it has continued.  No increase in wheezing.  He has trouble wearing his mask due to COPD.  He stays in his home during busy part of day.  EKG without changes.  We reviewed his cath results and I printed drawing of cath he could use with mychart results.      Past Medical History:  Diagnosis Date  . AAA (abdominal aortic aneurysm) ([place])   . Anginal pain ([place])   . Bulging lumbar disc    "& some in my neck"  . C. difficile colitis    2008 OR 09  . CAD (coronary artery disease)   . Change in platelet count    pt states 'has been low since 2012; patient had to receive platelets in surgery in Nov 2016'  . Chronic back pain   . Cirrhosis ([place])   . COPD (chronic obstructive pulmonary disease) ([place])   . Diabetic peripheral neuropathy ([place])   . Emphysema lung ([place])   . Enlarged liver   . Enlarged prostate   . GERD (gastroesophageal reflux disease)   . Hardening of the arteries of the brain   . Headache    "~  weekly" (04/12/2015)  . Heart attack [facility]) Nov. 15,2016   TIA  . History of colon polyps   . Hyperlipidemia   . Hypertension   . Leg pain    with walking and pain in feet while lying flat  . Memory loss    Due to hardening of the arteries in the brain  . Myocardial infarction ([place])    "I've had probably 6-7" (04/12/2015)  . Restless leg syndrome   . Rheumatic fever    AS A CHILD  ?6 YRS OLD  . RLS (restless legs syndrome)   . Shortness of breath 12/06/10   while lying flat and with exertion  . Snoring   . Spleen enlarged   . Stroke [facility]) 2004   denies residual on 04/12/2015  . Thrombocytopenia ([place])    since 2012; can be as low as 54K.   [name] TIA (transient ischemic attack) "several"   "since 2015" (04/12/2015)  . Type II diabetes mellitus ([place]) dx'd 2009    Past  Surgical History:  Procedure Laterality Date  . ABDOMINAL AORTIC ANEURYSM REPAIR  01/16/2011   EVAR  . ABDOMINAL AORTIC ANEURYSM REPAIR  04/12/2015  . CARDIAC CATHETERIZATION  "several"  . COLONOSCOPY    . COLONOSCOPY W/ BIOPSIES    . COLONOSCOPY WITH PROPOFOL N/A 03/21/2016   Procedure: COLONOSCOPY WITH PROPOFOL;  Surgeon: Arta Silence, MD;  Location: [facility] ENDOSCOPY;  Service: Endoscopy;  Laterality: N/A;  . CORONARY ANGIOPLASTY WITH STENT PLACEMENT  "several"   > 4 sents  . CORONARY ARTERY BYPASS GRAFT  02/18/2001   "CABG x 4"  . Direct sac puncture with liquid embolic repair of Type II endoleak  04/12/2015; 03/05/2017  . ESOPHAGOGASTRODUODENOSCOPY (EGD) WITH PROPOFOL N/A 03/21/2016   Procedure: ESOPHAGOGASTRODUODENOSCOPY (EGD) WITH PROPOFOL;  Surgeon: Arta Silence, MD;  Location: [facility] ENDOSCOPY;  Service: Endoscopy;  Laterality: N/A;  . IR AORTAGRAM ABDOMINAL SERIALOGRAM  03/05/2017  . IR CT SPINE LTD  03/05/2017  . IR EMBO ARTERIAL NOT HEMORR HEMANG INC GUIDE ROADMAPPING  03/05/2017  . IR RADIOLOGIST EVAL & MGMT  02/05/2017  . IR RADIOLOGIST EVAL & MGMT  04/01/2017  . IR RADIOLOGIST EVAL & MGMT  10/01/2017  . LEFT HEART CATH AND CORS/GRAFTS ANGIOGRAPHY N/A 11/13/2018   Procedure: LEFT HEART CATH AND CORS/GRAFTS ANGIOGRAPHY;  Surgeon: Belva Crome, MD;  Location: [place] CV LAB;  Service: Cardiovascular;  Laterality: N/A;  . PERIPHERAL VASCULAR CATHETERIZATION N/A 03/10/2015   Procedure: Abdominal Aortogram;  Surgeon: Elam Dutch, MD;  Location: [place] CV LAB;  Service: Cardiovascular;  Laterality: N/A;  . RADIOLOGY WITH ANESTHESIA N/A 04/12/2015   Procedure: embolization;  Surgeon: Jacqulynn Cadet, MD;  Location: [place];  Service: Radiology;  Laterality: N/A;  . RADIOLOGY WITH ANESTHESIA N/A 03/05/2017   Procedure: Type II Endoleak Repair;  Surgeon: Jacqulynn Cadet, MD;  Location: [place];  Service: Radiology;  Laterality: N/A;  . SHOULDER SURGERY Right 07-27-2015  .  TONSILLECTOMY  ~ 1965     Current Outpatient Medications  Medication Sig Dispense Refill  . albuterol (PROVENTIL HFA;VENTOLIN HFA) 108 (90 BASE) MCG/ACT inhaler Inhale 2 puffs into the lungs every 6 (six) hours as needed for wheezing or shortness of breath.     [name] aspirin EC 81 MG tablet Take 1 tablet (81 mg total) by mouth daily.    . Cholecalciferol (VITAMIN D) 2000 units tablet Take 2,000 Units by mouth 2 (two) times daily.     . clonazePAM (KLONOPIN) 1 MG tablet Take  1 mg by mouth 2 (two) times daily.     . clopidogrel (PLAVIX) 75 MG tablet Take 1 tablet (75 mg total) by mouth daily.    . cyanocobalamin (,VITAMIN B-12,) 1000 MCG/ML injection Inject 1,000 mcg into the muscle See admin instructions. Every 30 to 60 days    . diphenhydrAMINE (BENADRYL) 25 MG tablet Take 25 mg by mouth daily as needed for allergies.    [name] empagliflozin (JARDIANCE) 10 MG TABS tablet Take 10 mg by mouth daily.    [name] escitalopram (LEXAPRO) 20 MG tablet Take 20 mg by mouth at bedtime.    [name] ezetimibe-simvastatin (VYTORIN) 10-40 MG per tablet Take 1 tablet by mouth at bedtime.     . fenofibrate 160 MG tablet Take 160 mg by mouth daily.     . Fluticasone-Salmeterol (ADVAIR DISKUS) 100-50 MCG/DOSE AEPB Advair Diskus 100 mcg-50 mcg/dose powder for inhalation    . furosemide (LASIX) 40 MG tablet Take 40 mg by mouth every other day.     [name] glimepiride (AMARYL) 4 MG tablet Take 4 mg by mouth every evening.     . lansoprazole (PREVACID) 30 MG capsule Take 30 mg by mouth daily.   3  . LANTUS SOLOSTAR 100 UNIT/ML Solostar Pen Inject 17 Units into the skin at bedtime.     [name] losartan (COZAAR) 50 MG tablet Take 50 mg by mouth daily.      . metFORMIN (GLUCOPHAGE) 1000 MG tablet Take 1,000 mg by mouth daily with breakfast.     . metoprolol (LOPRESSOR) 50 MG tablet Take 50 mg by mouth 2 (two) times daily.     . nitroGLYCERIN (NITROSTAT) 0.4 MG SL tablet Place 1 tablet (0.4 mg total) under the tongue every 5 (five) minutes as needed  for chest pain. 25 tablet 3  . oxyCODONE-acetaminophen (PERCOCET) 5-325 MG tablet Take 1 tablet by mouth every 4 (four) hours as needed (max 6 q). 30 tablet 0  . OXYGEN Inhale into the lungs. 2L at night    . pramipexole (MIRAPEX) 0.25 MG tablet Take 0.25 mg by mouth at bedtime.     . pregabalin (LYRICA) 100 MG capsule Take 100-200 mg by mouth See admin instructions. Take 1 tablet (100 mg) by mouth every morning and 2 tablets (200 mg) at bedtime    . ranolazine (RANEXA) 1000 MG SR tablet Take 1,000 mg by mouth 2 (two) times daily.      [name] terazosin (HYTRIN) 1 MG capsule Take 1 mg by mouth at bedtime.     [name] tiotropium (SPIRIVA) 18 MCG inhalation capsule Place 18 mcg into inhaler and inhale daily.      No current facility-administered medications for this visit.     Allergies:   Patient has no known allergies.    Social History:  The patient  reports that he quit smoking about 17 years ago. His smoking use included cigarettes. He smoked 0.00 packs per day for 30.00 years. He has never used smokeless tobacco. He reports current alcohol use of about 1.0 standard drinks of alcohol per week. He reports that he does not use drugs.   Family History:  The patient's family history includes Aneurysm in his maternal aunt, maternal uncle, and mother; Cancer in his sister; Heart attack in his brother, brother, and brother; Heart disease in his brother, brother, mother, and sister; Hyperlipidemia in his father and mother; Hypertension in his brother, father, and mother; Lupus in his sister; Other in his maternal aunt, maternal uncle, and mother.  ROS:  General:no colds or fevers, no weight changes Skin:no rashes or ulcers HEENT:no blurred vision, no congestion CV:see HPI PUL:see HPI GI:no diarrhea constipation or melena, no indigestion GU:no hematuria, no dysuria MS:no joint pain, no claudication Neuro:no syncope, no lightheadedness some decreased memory Endo:+ diabetes, no thyroid disease  Wt  Readings from Last 3 Encounters:  12/07/18 213 lb 1.9 oz (96.7 kg)  11/13/18 210 lb (95.3 kg)  11/10/18 211 lb (95.7 kg)     PHYSICAL EXAM: VS:  BP 128/72   Pulse 60   Ht 5\' 11"  (1.803 m)   Wt 213 lb 1.9 oz (96.7 kg)   SpO2 95%   BMI 29.72 kg/m  , BMI Body mass index is 29.72 kg/m. General:Pleasant affect, NAD Skin:Warm and dry, brisk capillary refill HEENT:normocephalic, sclera clear, mucus membranes moist Neck:supple, no JVD, no bruits  Heart:S1S2 RRR without murmur, gallup, rub or click Lungs:clear without rales, rhonchi, or wheezes ZOX:WRUE, non tender, + BS, do not palpate liver spleen or masses Ext:no lower ext edema, 2+ pedal pulses, 2+ radial pulses Neuro:alert and oriented X 3, MAE, follows commands, + facial symmetry    EKG:  EKG is ordered today. The ekg ordered today demonstrates SB at 54 Q wave in III no change from prior.  No acute ST changes.     Recent Labs: 03/16/2018: ALT 40 11/10/2018: BUN 18; Creatinine, Ser 1.09; Hemoglobin 15.4; Platelets 100; Potassium 4.6; Sodium 141    Lipid Panel    Component Value Date/Time   CHOL 129 05/02/2013 0410   TRIG 230 (H) 05/02/2013 0410   HDL 69 05/02/2013 0410   CHOLHDL 1.9 05/02/2013 0410   VLDL 46 (H) 05/02/2013 0410   LDLCALC 14 05/02/2013 0410       Other studies Reviewed: Additional studies/ records that were reviewed today include:  Cardiac cath 11/2018.  Diffuse heavy native vessel calcification consistent with Monckeberg's sclerosis.  Total occlusion of the proximal to mid LAD.  Total occlusion of the first obtuse marginal and diffuse disease in the mid circumflex and second obtuse marginal, up to 70 to 80% in spots.  Ostial occlusion of the right coronary  Patent left main  Patent bypass graft to the distal RCA  Patent bypass graft to the first obtuse marginal  Patent bypass graft to the first diagonal with retrofilling of the mid to distal LAD  Either atresia or total occlusion of the  LIMA.  RECOMMENDATIONS:   Continue aggressive risk factor modification.  Uptitrate anti-ischemic therapy as needed to control symptoms.  ASSESSMENT AND PLAN:  1.  Angina continues, unable to intervene with cath.  Will add nitro dur patch 0.4 mg apply in AM and remove at HS.   He is on Ranexa 100 mg BID.  We could increase to 1500 mg BID.  Will check with Dr. Tamala Julian  otherwise continue on ASA, plavix BB, ARB as well.  2.  CAD with CABG and graft dysfunction.  LIMA is occluded.  Treat medically   3.  HTN controlled.   4. HLD on statin ezetimibe, and fenofibrate.   5.  Diabetes per PCP.   6.  abd aortic aneurysm s/p EVAR graft needs follow up with Dr. Oneida Alar. Pt's wife is aware.   Current medicines are reviewed with the patient today.  The patient Has no concerns regarding medicines.  The following changes have been made:  See above Labs/ tests ordered today include:see above  Disposition:   FU:  see above  Signed, Cecilie Kicks,  NP  12/07/2018 10:46 AM    [facility] [place], [address] [place], [place] Phone: [phone]; Fax: [phone]

## 2018-12-07 ENCOUNTER — Encounter: Payer: Self-pay | Admitting: Cardiology

## 2018-12-07 ENCOUNTER — Ambulatory Visit
Admission: RE | Admit: 2018-12-07 | Discharge: 2018-12-07 | Disposition: A | Discharge status: Home / Self Care | Payer: Medicare HMO | Source: Ambulatory Visit | Attending: Cardiology | Admitting: Cardiology

## 2018-12-07 ENCOUNTER — Ambulatory Visit (INDEPENDENT_AMBULATORY_CARE_PROVIDER_SITE_OTHER): Payer: Medicare HMO | Admitting: Cardiology

## 2018-12-07 ENCOUNTER — Other Ambulatory Visit: Payer: Self-pay

## 2018-12-07 VITALS — BP 128/72 | HR 60 | Ht 71.0 in | Wt 213.1 lb

## 2018-12-07 DIAGNOSIS — I10 Essential (primary) hypertension: Secondary | ICD-10-CM | POA: Diagnosis not present

## 2018-12-07 DIAGNOSIS — R0789 Other chest pain: Secondary | ICD-10-CM

## 2018-12-07 DIAGNOSIS — R0602 Shortness of breath: Secondary | ICD-10-CM | POA: Diagnosis not present

## 2018-12-07 DIAGNOSIS — I714 Abdominal aortic aneurysm, without rupture, unspecified: Secondary | ICD-10-CM

## 2018-12-07 DIAGNOSIS — E785 Hyperlipidemia, unspecified: Secondary | ICD-10-CM | POA: Diagnosis not present

## 2018-12-07 DIAGNOSIS — R079 Chest pain, unspecified: Secondary | ICD-10-CM

## 2018-12-07 DIAGNOSIS — E118 Type 2 diabetes mellitus with unspecified complications: Secondary | ICD-10-CM | POA: Diagnosis not present

## 2018-12-07 DIAGNOSIS — I25110 Atherosclerotic heart disease of native coronary artery with unstable angina pectoris: Secondary | ICD-10-CM | POA: Diagnosis not present

## 2018-12-07 MED ORDER — NITROGLYCERIN 0.4 MG/HR TD PT24: MEDICATED_PATCH | TRANSDERMAL | 12 refills | Status: DC

## 2018-12-07 NOTE — Patient Instructions (Signed)
Medication Instructions:  Nitrodur Patch 0.4 (Apply in the morning, remove at bed time) If you need a refill on your cardiac medications before your next appointment, please call your pharmacy.   Lab work: none If you have labs (blood work) drawn today and your tests are completely normal, you will receive your results only by: [name] MyChart Message (if you have MyChart) OR . A paper copy in the mail If you have any lab test that is abnormal or we need to change your treatment, we will call you to review the results.  Testing/Procedures: Chest Xray [facility]) [address] A chest x-ray takes a picture of the organs and structures inside the chest, including the heart, lungs, and blood vessels. This test can show several things, including, whether the heart is enlarges; whether fluid is building up in the lungs; and whether pacemaker / defibrillator leads are still in place.   Follow-Up: 2 months with Dr Tamala Julian At [facility], you and your health needs are our priority.  As part of our continuing mission to provide you with exceptional heart care, we have created designated Provider Care Teams.  These Care Teams include your primary Cardiologist (physician) and Advanced Practice Providers (APPs -  Physician Assistants and Nurse Practitioners) who all work together to provide you with the care you need, when you need it. .   Any Other Special Instructions Will Be Listed Below (If Applicable).

## 2018-12-07 NOTE — Progress Notes (Signed)
Reached pt re: cxr results.  Voicemail hadn't been set up yet. Sent a message to [employer].

## 2018-12-08 DIAGNOSIS — J449 Chronic obstructive pulmonary disease, unspecified: Secondary | ICD-10-CM | POA: Diagnosis not present

## 2018-12-29 DIAGNOSIS — J449 Chronic obstructive pulmonary disease, unspecified: Secondary | ICD-10-CM | POA: Diagnosis not present

## 2018-12-29 DIAGNOSIS — Z1331 Encounter for screening for depression: Secondary | ICD-10-CM | POA: Diagnosis not present

## 2018-12-29 DIAGNOSIS — I209 Angina pectoris, unspecified: Secondary | ICD-10-CM | POA: Diagnosis not present

## 2018-12-29 DIAGNOSIS — I2510 Atherosclerotic heart disease of native coronary artery without angina pectoris: Secondary | ICD-10-CM | POA: Diagnosis not present

## 2018-12-29 DIAGNOSIS — I714 Abdominal aortic aneurysm, without rupture: Secondary | ICD-10-CM | POA: Diagnosis not present

## 2018-12-29 DIAGNOSIS — F1120 Opioid dependence, uncomplicated: Secondary | ICD-10-CM | POA: Diagnosis not present

## 2018-12-29 DIAGNOSIS — Z794 Long term (current) use of insulin: Secondary | ICD-10-CM | POA: Diagnosis not present

## 2018-12-29 DIAGNOSIS — E1151 Type 2 diabetes mellitus with diabetic peripheral angiopathy without gangrene: Secondary | ICD-10-CM | POA: Diagnosis not present

## 2018-12-29 DIAGNOSIS — I25708 Atherosclerosis of coronary artery bypass graft(s), unspecified, with other forms of angina pectoris: Secondary | ICD-10-CM | POA: Diagnosis not present

## 2018-12-29 DIAGNOSIS — Z9119 Patient's noncompliance with other medical treatment and regimen: Secondary | ICD-10-CM | POA: Diagnosis not present

## 2019-01-05 DIAGNOSIS — Z86010 Personal history of colonic polyps: Secondary | ICD-10-CM | POA: Diagnosis not present

## 2019-01-05 DIAGNOSIS — R109 Unspecified abdominal pain: Secondary | ICD-10-CM | POA: Diagnosis not present

## 2019-01-05 DIAGNOSIS — K7460 Unspecified cirrhosis of liver: Secondary | ICD-10-CM | POA: Diagnosis not present

## 2019-01-07 ENCOUNTER — Other Ambulatory Visit: Payer: Self-pay | Admitting: Gastroenterology

## 2019-01-07 ENCOUNTER — Telehealth: Payer: Self-pay | Admitting: *Deleted

## 2019-01-07 ENCOUNTER — Other Ambulatory Visit: Payer: Self-pay | Admitting: Interventional Radiology

## 2019-01-07 DIAGNOSIS — T82330A Leakage of aortic (bifurcation) graft (replacement), initial encounter: Secondary | ICD-10-CM

## 2019-01-07 DIAGNOSIS — IMO0002 Reserved for concepts with insufficient information to code with codable children: Secondary | ICD-10-CM

## 2019-01-07 DIAGNOSIS — K7469 Other cirrhosis of liver: Secondary | ICD-10-CM

## 2019-01-07 NOTE — Telephone Encounter (Signed)
I s/w Dr. Laurence Ferrari regarding [name]. Dr. Laurence Ferrari stated he will be more that happy to call patient and go over ct. I s/w Mr. [name] he states he would like a call the end of August after his Mri. He is scheduled.Cathren Harsh

## 2019-01-07 NOTE — Telephone Encounter (Signed)
Dr. Laurence Ferrari reviewed the Cta dated 09-21-2018. He states the next follow up will be April 2021 with another Cta Abdomen/Pel and office visit. The patient was informed and in agreement./vm

## 2019-01-08 DIAGNOSIS — J449 Chronic obstructive pulmonary disease, unspecified: Secondary | ICD-10-CM | POA: Diagnosis not present

## 2019-01-21 ENCOUNTER — Other Ambulatory Visit: Payer: Self-pay | Admitting: Interventional Cardiology

## 2019-01-21 MED ORDER — RANOLAZINE ER 1000 MG PO TB12: 1000.0000 mg | ORAL_TABLET | Freq: Two times a day (BID) | ORAL | 3 refills | Status: DC

## 2019-01-21 NOTE — Telephone Encounter (Signed)
If he is taking this medication, we should refill it and assume responsibility.

## 2019-01-21 NOTE — Telephone Encounter (Signed)
Spoke with wife, DPR on file and she states that pt has been taking this medication since his CABG.  Dr. Virgina Jock, PCP, filled it last but when she called their office for refills, they told her they had no record of pt ever being on the medication or that they had ever filled it.  Wife would like to know if Dr. Tamala Julian would be willing to take over filling this medication?  Pt is currently out of meds.

## 2019-01-21 NOTE — Telephone Encounter (Signed)
Pt's wife is calling requesting a refill on Ranexa. Dr. Tamala Julian did not prescribe this medication. Would Dr. Tamala Julian like to refill this medication? Pt's wife would like a call back concerning this matter. Please address

## 2019-01-29 DIAGNOSIS — G459 Transient cerebral ischemic attack, unspecified: Secondary | ICD-10-CM | POA: Diagnosis not present

## 2019-01-29 DIAGNOSIS — Z7951 Long term (current) use of inhaled steroids: Secondary | ICD-10-CM | POA: Diagnosis not present

## 2019-01-29 DIAGNOSIS — Z8673 Personal history of transient ischemic attack (TIA), and cerebral infarction without residual deficits: Secondary | ICD-10-CM | POA: Diagnosis not present

## 2019-01-29 DIAGNOSIS — R42 Dizziness and giddiness: Secondary | ICD-10-CM | POA: Diagnosis not present

## 2019-01-29 DIAGNOSIS — E119 Type 2 diabetes mellitus without complications: Secondary | ICD-10-CM | POA: Diagnosis not present

## 2019-01-29 DIAGNOSIS — R29810 Facial weakness: Secondary | ICD-10-CM | POA: Diagnosis not present

## 2019-01-29 DIAGNOSIS — J449 Chronic obstructive pulmonary disease, unspecified: Secondary | ICD-10-CM | POA: Diagnosis not present

## 2019-01-29 DIAGNOSIS — Z951 Presence of aortocoronary bypass graft: Secondary | ICD-10-CM | POA: Diagnosis not present

## 2019-01-29 DIAGNOSIS — I10 Essential (primary) hypertension: Secondary | ICD-10-CM | POA: Diagnosis not present

## 2019-01-29 DIAGNOSIS — H532 Diplopia: Secondary | ICD-10-CM | POA: Diagnosis not present

## 2019-01-29 DIAGNOSIS — I2510 Atherosclerotic heart disease of native coronary artery without angina pectoris: Secondary | ICD-10-CM | POA: Diagnosis not present

## 2019-01-30 ENCOUNTER — Inpatient Hospital Stay (HOSPITAL_COMMUNITY): Payer: Medicare HMO

## 2019-01-30 ENCOUNTER — Encounter (HOSPITAL_COMMUNITY): Payer: Self-pay | Admitting: Internal Medicine

## 2019-01-30 ENCOUNTER — Inpatient Hospital Stay (HOSPITAL_COMMUNITY)
Admission: AD | Admit: 2019-01-30 | Discharge: 2019-02-01 | DRG: 069 | Disposition: A | Discharge status: Home Health | Payer: Medicare HMO | Source: Other Acute Inpatient Hospital | Attending: Internal Medicine | Admitting: Internal Medicine

## 2019-01-30 ENCOUNTER — Other Ambulatory Visit: Payer: Self-pay

## 2019-01-30 DIAGNOSIS — Z794 Long term (current) use of insulin: Secondary | ICD-10-CM

## 2019-01-30 DIAGNOSIS — J439 Emphysema, unspecified: Secondary | ICD-10-CM | POA: Diagnosis present

## 2019-01-30 DIAGNOSIS — R200 Anesthesia of skin: Secondary | ICD-10-CM | POA: Diagnosis present

## 2019-01-30 DIAGNOSIS — R29701 NIHSS score 1: Secondary | ICD-10-CM | POA: Diagnosis present

## 2019-01-30 DIAGNOSIS — Z8249 Family history of ischemic heart disease and other diseases of the circulatory system: Secondary | ICD-10-CM

## 2019-01-30 DIAGNOSIS — K7460 Unspecified cirrhosis of liver: Secondary | ICD-10-CM | POA: Diagnosis present

## 2019-01-30 DIAGNOSIS — H532 Diplopia: Secondary | ICD-10-CM

## 2019-01-30 DIAGNOSIS — E1142 Type 2 diabetes mellitus with diabetic polyneuropathy: Secondary | ICD-10-CM | POA: Diagnosis present

## 2019-01-30 DIAGNOSIS — N400 Enlarged prostate without lower urinary tract symptoms: Secondary | ICD-10-CM | POA: Diagnosis present

## 2019-01-30 DIAGNOSIS — R29810 Facial weakness: Secondary | ICD-10-CM | POA: Diagnosis present

## 2019-01-30 DIAGNOSIS — K219 Gastro-esophageal reflux disease without esophagitis: Secondary | ICD-10-CM | POA: Diagnosis present

## 2019-01-30 DIAGNOSIS — I10 Essential (primary) hypertension: Secondary | ICD-10-CM | POA: Diagnosis present

## 2019-01-30 DIAGNOSIS — J9611 Chronic respiratory failure with hypoxia: Secondary | ICD-10-CM | POA: Diagnosis not present

## 2019-01-30 DIAGNOSIS — R400 Somnolence: Secondary | ICD-10-CM | POA: Diagnosis present

## 2019-01-30 DIAGNOSIS — E785 Hyperlipidemia, unspecified: Secondary | ICD-10-CM | POA: Diagnosis present

## 2019-01-30 DIAGNOSIS — G43409 Hemiplegic migraine, not intractable, without status migrainosus: Secondary | ICD-10-CM | POA: Diagnosis present

## 2019-01-30 DIAGNOSIS — Z87891 Personal history of nicotine dependence: Secondary | ICD-10-CM

## 2019-01-30 DIAGNOSIS — D696 Thrombocytopenia, unspecified: Secondary | ICD-10-CM | POA: Diagnosis present

## 2019-01-30 DIAGNOSIS — G459 Transient cerebral ischemic attack, unspecified: Secondary | ICD-10-CM | POA: Diagnosis present

## 2019-01-30 DIAGNOSIS — I2510 Atherosclerotic heart disease of native coronary artery without angina pectoris: Secondary | ICD-10-CM | POA: Diagnosis present

## 2019-01-30 DIAGNOSIS — I252 Old myocardial infarction: Secondary | ICD-10-CM

## 2019-01-30 DIAGNOSIS — Z9981 Dependence on supplemental oxygen: Secondary | ICD-10-CM | POA: Diagnosis not present

## 2019-01-30 DIAGNOSIS — Z20828 Contact with and (suspected) exposure to other viral communicable diseases: Secondary | ICD-10-CM | POA: Diagnosis present

## 2019-01-30 DIAGNOSIS — Z8673 Personal history of transient ischemic attack (TIA), and cerebral infarction without residual deficits: Secondary | ICD-10-CM

## 2019-01-30 DIAGNOSIS — E119 Type 2 diabetes mellitus without complications: Secondary | ICD-10-CM

## 2019-01-30 DIAGNOSIS — Z832 Family history of diseases of the blood and blood-forming organs and certain disorders involving the immune mechanism: Secondary | ICD-10-CM

## 2019-01-30 DIAGNOSIS — Z7982 Long term (current) use of aspirin: Secondary | ICD-10-CM | POA: Diagnosis not present

## 2019-01-30 DIAGNOSIS — Z8349 Family history of other endocrine, nutritional and metabolic diseases: Secondary | ICD-10-CM

## 2019-01-30 DIAGNOSIS — Z951 Presence of aortocoronary bypass graft: Secondary | ICD-10-CM | POA: Diagnosis not present

## 2019-01-30 DIAGNOSIS — G2581 Restless legs syndrome: Secondary | ICD-10-CM | POA: Diagnosis present

## 2019-01-30 DIAGNOSIS — G8190 Hemiplegia, unspecified affecting unspecified side: Secondary | ICD-10-CM

## 2019-01-30 DIAGNOSIS — Z8679 Personal history of other diseases of the circulatory system: Secondary | ICD-10-CM | POA: Diagnosis not present

## 2019-01-30 DIAGNOSIS — I25708 Atherosclerosis of coronary artery bypass graft(s), unspecified, with other forms of angina pectoris: Secondary | ICD-10-CM | POA: Diagnosis present

## 2019-01-30 DIAGNOSIS — Z8619 Personal history of other infectious and parasitic diseases: Secondary | ICD-10-CM

## 2019-01-30 DIAGNOSIS — Z79899 Other long term (current) drug therapy: Secondary | ICD-10-CM

## 2019-01-30 DIAGNOSIS — Z7902 Long term (current) use of antithrombotics/antiplatelets: Secondary | ICD-10-CM

## 2019-01-30 DIAGNOSIS — I6503 Occlusion and stenosis of bilateral vertebral arteries: Secondary | ICD-10-CM | POA: Diagnosis not present

## 2019-01-30 DIAGNOSIS — Z7984 Long term (current) use of oral hypoglycemic drugs: Secondary | ICD-10-CM

## 2019-01-30 DIAGNOSIS — I639 Cerebral infarction, unspecified: Secondary | ICD-10-CM | POA: Diagnosis not present

## 2019-01-30 DIAGNOSIS — I25810 Atherosclerosis of coronary artery bypass graft(s) without angina pectoris: Secondary | ICD-10-CM | POA: Diagnosis present

## 2019-01-30 HISTORY — DX: Diplopia: H53.2

## 2019-01-30 HISTORY — DX: Anesthesia of skin: R20.0

## 2019-01-30 LAB — GLUCOSE, CAPILLARY
Glucose-Capillary: 198 mg/dL — ABNORMAL HIGH (ref 70–99)
Glucose-Capillary: 136 mg/dL — ABNORMAL HIGH (ref 70–99)
Glucose-Capillary: 224 mg/dL — ABNORMAL HIGH (ref 70–99)
Glucose-Capillary: 213 mg/dL — ABNORMAL HIGH (ref 70–99)
Glucose-Capillary: 230 mg/dL — ABNORMAL HIGH (ref 70–99)

## 2019-01-30 LAB — BASIC METABOLIC PANEL
Anion gap: 10 (ref 5–15)
BUN: 10 mg/dL (ref 8–23)
CO2: 24 mmol/L (ref 22–32)
Calcium: 8.6 mg/dL — ABNORMAL LOW (ref 8.9–10.3)
Chloride: 102 mmol/L (ref 98–111)
Creatinine, Ser: 0.73 mg/dL (ref 0.61–1.24)
GFR calc Af Amer: >60 mL/min (ref >60)
GFR calc non Af Amer: >60 mL/min (ref >60)
Glucose, Bld: 166 mg/dL — ABNORMAL HIGH (ref 70–99)
Potassium: 3.5 mmol/L (ref 3.5–5.1)
Sodium: 136 mmol/L (ref 135–145)

## 2019-01-30 LAB — CBC
HCT: 41.6 % (ref 39.0–52.0)
Hemoglobin: 14.2 g/dL (ref 13.0–17.0)
MCH: 31.2 pg (ref 26.0–34.0)
MCHC: 34.1 g/dL (ref 30.0–36.0)
MCV: 91.4 fL (ref 80.0–100.0)
Platelets: 50 10*3/uL — ABNORMAL LOW (ref 150–400)
RBC: 4.55 MIL/uL (ref 4.22–5.81)
RDW: 12.9 % (ref 11.5–15.5)
WBC: 4.2 10*3/uL (ref 4.0–10.5)
nRBC: 0 % (ref 0.0–0.2)

## 2019-01-30 LAB — LIPID PANEL
Cholesterol: 103 mg/dL (ref 0–200)
HDL: 25 mg/dL — ABNORMAL LOW (ref >40)
LDL Cholesterol: 25 mg/dL (ref 0–99)
Total CHOL/HDL Ratio: 4.1 RATIO
Triglycerides: 267 mg/dL — ABNORMAL HIGH (ref <150)
VLDL: 53 mg/dL — ABNORMAL HIGH (ref 0–40)

## 2019-01-30 LAB — RAPID URINE DRUG SCREEN, HOSP PERFORMED
Amphetamines: NOT DETECTED
Barbiturates: NOT DETECTED
Benzodiazepines: NOT DETECTED
Cocaine: NOT DETECTED
Opiates: NOT DETECTED
Tetrahydrocannabinol: NOT DETECTED

## 2019-01-30 LAB — HEMOGLOBIN A1C
Hgb A1c MFr Bld: 8.8 % — ABNORMAL HIGH (ref 4.8–5.6)
Mean Plasma Glucose: 205.86 mg/dL

## 2019-01-30 LAB — HIV ANTIBODY (ROUTINE TESTING W REFLEX): HIV Screen 4th Generation wRfx: NONREACTIVE

## 2019-01-30 LAB — ECHOCARDIOGRAM COMPLETE
Height: 71 in
Weight: 3319.25 oz

## 2019-01-30 LAB — PROTIME-INR
INR: 1.2 (ref 0.8–1.2)
Prothrombin Time: 15 seconds (ref 11.4–15.2)

## 2019-01-30 MED ORDER — ACETAMINOPHEN 325 MG PO TABS: 650.0000 mg | ORAL_TABLET | ORAL | Status: DC | PRN

## 2019-01-30 MED ORDER — INSULIN ASPART 100 UNIT/ML ~~LOC~~ SOLN
0.0000 [IU] | Freq: Every day | SUBCUTANEOUS | Status: DC
  Administered 2019-01-30 – 2019-01-31 (×2): 2 [IU] via SUBCUTANEOUS

## 2019-01-30 MED ORDER — PANTOPRAZOLE SODIUM 20 MG PO TBEC
20.0000 mg | DELAYED_RELEASE_TABLET | Freq: Every day | ORAL | Status: DC
  Administered 2019-01-30 – 2019-02-01 (×3): 20 mg via ORAL
  Filled 2019-01-30 (×3): qty 1

## 2019-01-30 MED ORDER — STROKE: EARLY STAGES OF RECOVERY BOOK
Freq: Once | Status: DC
  Filled 2019-01-30: qty 1

## 2019-01-30 MED ORDER — OXYCODONE-ACETAMINOPHEN 5-325 MG PO TABS
1.0000 | ORAL_TABLET | ORAL | Status: DC | PRN
  Administered 2019-01-30 – 2019-02-01 (×4): 1 via ORAL
  Filled 2019-01-30 (×4): qty 1

## 2019-01-30 MED ORDER — TERAZOSIN HCL 1 MG PO CAPS
1.0000 mg | ORAL_CAPSULE | Freq: Every day | ORAL | Status: DC
  Administered 2019-01-30: 1 mg via ORAL
  Filled 2019-01-30 (×2): qty 1

## 2019-01-30 MED ORDER — CLOPIDOGREL BISULFATE 75 MG PO TABS: 75.0000 mg | ORAL_TABLET | Freq: Every day | ORAL | Status: DC

## 2019-01-30 MED ORDER — ACETAMINOPHEN 160 MG/5ML PO SOLN: 650.0000 mg | ORAL | Status: DC | PRN

## 2019-01-30 MED ORDER — LOSARTAN POTASSIUM 50 MG PO TABS
50.0000 mg | ORAL_TABLET | Freq: Every day | ORAL | Status: DC
  Administered 2019-01-30 – 2019-01-31 (×2): 50 mg via ORAL
  Filled 2019-01-30 (×2): qty 1

## 2019-01-30 MED ORDER — PRAMIPEXOLE DIHYDROCHLORIDE 0.25 MG PO TABS
0.2500 mg | ORAL_TABLET | Freq: Every day | ORAL | Status: DC
  Administered 2019-01-30 – 2019-01-31 (×2): 0.25 mg via ORAL
  Filled 2019-01-30 (×3): qty 1

## 2019-01-30 MED ORDER — RANOLAZINE ER 500 MG PO TB12
1000.0000 mg | ORAL_TABLET | Freq: Two times a day (BID) | ORAL | Status: DC
  Administered 2019-01-30 – 2019-02-01 (×5): 1000 mg via ORAL
  Filled 2019-01-30 (×5): qty 2

## 2019-01-30 MED ORDER — MOMETASONE FURO-FORMOTEROL FUM 100-5 MCG/ACT IN AERO
2.0000 | INHALATION_SPRAY | Freq: Two times a day (BID) | RESPIRATORY_TRACT | Status: DC
  Administered 2019-01-30 – 2019-02-01 (×4): 2 via RESPIRATORY_TRACT
  Filled 2019-01-30: qty 8.8

## 2019-01-30 MED ORDER — GLIMEPIRIDE 4 MG PO TABS
4.0000 mg | ORAL_TABLET | Freq: Every evening | ORAL | Status: DC
  Administered 2019-01-31: 4 mg via ORAL
  Filled 2019-01-30 (×3): qty 1

## 2019-01-30 MED ORDER — ESCITALOPRAM OXALATE 10 MG PO TABS
20.0000 mg | ORAL_TABLET | Freq: Every day | ORAL | Status: DC
  Administered 2019-01-30 – 2019-01-31 (×2): 20 mg via ORAL
  Filled 2019-01-30 (×2): qty 2

## 2019-01-30 MED ORDER — UMECLIDINIUM BROMIDE 62.5 MCG/INH IN AEPB
1.0000 | INHALATION_SPRAY | Freq: Every day | RESPIRATORY_TRACT | Status: DC
  Filled 2019-01-30 (×2): qty 7

## 2019-01-30 MED ORDER — VITAMIN D 25 MCG (1000 UNIT) PO TABS
2000.0000 [IU] | ORAL_TABLET | Freq: Two times a day (BID) | ORAL | Status: DC
  Administered 2019-01-30 – 2019-02-01 (×5): 2000 [IU] via ORAL
  Filled 2019-01-30 (×5): qty 2

## 2019-01-30 MED ORDER — VERAPAMIL HCL ER 120 MG PO TBCR
120.0000 mg | EXTENDED_RELEASE_TABLET | Freq: Every day | ORAL | Status: DC
  Administered 2019-01-31 – 2019-02-01 (×2): 120 mg via ORAL
  Filled 2019-01-30 (×3): qty 1

## 2019-01-30 MED ORDER — INSULIN GLARGINE 100 UNIT/ML ~~LOC~~ SOLN
17.0000 [IU] | Freq: Every day | SUBCUTANEOUS | Status: DC
  Administered 2019-01-30 – 2019-01-31 (×2): 17 [IU] via SUBCUTANEOUS
  Filled 2019-01-30 (×3): qty 0.17

## 2019-01-30 MED ORDER — FUROSEMIDE 40 MG PO TABS
40.0000 mg | ORAL_TABLET | ORAL | Status: DC
  Administered 2019-01-30 – 2019-02-01 (×2): 40 mg via ORAL
  Filled 2019-01-30 (×2): qty 1

## 2019-01-30 MED ORDER — GADOBUTROL 1 MMOL/ML IV SOLN
9.0000 mL | Freq: Once | INTRAVENOUS | Status: AC | PRN
  Administered 2019-01-30: 9 mL via INTRAVENOUS

## 2019-01-30 MED ORDER — CYANOCOBALAMIN 1000 MCG/ML IJ SOLN: 1000.0000 ug | INTRAMUSCULAR | Status: DC

## 2019-01-30 MED ORDER — INSULIN ASPART 100 UNIT/ML ~~LOC~~ SOLN
0.0000 [IU] | Freq: Three times a day (TID) | SUBCUTANEOUS | Status: DC
  Administered 2019-01-30: 3 [IU] via SUBCUTANEOUS
  Administered 2019-01-30: 1 [IU] via SUBCUTANEOUS
  Administered 2019-01-30: 3 [IU] via SUBCUTANEOUS
  Administered 2019-01-31: 13:00:00 9 [IU] via SUBCUTANEOUS
  Administered 2019-01-31: 17:00:00 7 [IU] via SUBCUTANEOUS
  Administered 2019-01-31 – 2019-02-01 (×3): 2 [IU] via SUBCUTANEOUS

## 2019-01-30 MED ORDER — ASPIRIN EC 81 MG PO TBEC
81.0000 mg | DELAYED_RELEASE_TABLET | Freq: Every day | ORAL | Status: DC
  Administered 2019-01-30 – 2019-02-01 (×3): 81 mg via ORAL
  Filled 2019-01-30 (×3): qty 1

## 2019-01-30 MED ORDER — ACETAMINOPHEN 650 MG RE SUPP: 650.0000 mg | RECTAL | Status: DC | PRN

## 2019-01-30 MED ORDER — NITROGLYCERIN 0.4 MG/HR TD PT24
0.4000 mg | MEDICATED_PATCH | Freq: Every day | TRANSDERMAL | Status: DC
  Administered 2019-01-30 – 2019-02-01 (×3): 0.4 mg via TRANSDERMAL
  Filled 2019-01-30 (×3): qty 1

## 2019-01-30 MED ORDER — CLONAZEPAM 0.5 MG PO TABS
1.0000 mg | ORAL_TABLET | Freq: Two times a day (BID) | ORAL | Status: DC
  Administered 2019-01-30 – 2019-01-31 (×3): 1 mg via ORAL
  Filled 2019-01-30 (×3): qty 2

## 2019-01-30 MED ORDER — METFORMIN HCL 500 MG PO TABS: 1000.0000 mg | ORAL_TABLET | Freq: Every day | ORAL | Status: DC

## 2019-01-30 MED ORDER — EZETIMIBE-SIMVASTATIN 10-40 MG PO TABS
1.0000 | ORAL_TABLET | Freq: Every day | ORAL | Status: DC
  Administered 2019-01-30 – 2019-01-31 (×2): 1 via ORAL
  Filled 2019-01-30 (×3): qty 1

## 2019-01-30 MED ORDER — PREGABALIN 50 MG PO CAPS
100.0000 mg | ORAL_CAPSULE | Freq: Every day | ORAL | Status: DC
  Administered 2019-01-30 – 2019-02-01 (×3): 100 mg via ORAL
  Filled 2019-01-30 (×3): qty 2

## 2019-01-30 MED ORDER — PREGABALIN 75 MG PO CAPS
200.0000 mg | ORAL_CAPSULE | Freq: Every day | ORAL | Status: DC
  Administered 2019-01-30 – 2019-01-31 (×2): 200 mg via ORAL
  Filled 2019-01-30 (×2): qty 2

## 2019-01-30 MED ORDER — FENOFIBRATE 160 MG PO TABS
160.0000 mg | ORAL_TABLET | Freq: Every day | ORAL | Status: DC
  Administered 2019-01-30 – 2019-02-01 (×3): 160 mg via ORAL
  Filled 2019-01-30 (×3): qty 1

## 2019-01-30 MED ORDER — CANAGLIFLOZIN 100 MG PO TABS
100.0000 mg | ORAL_TABLET | Freq: Every day | ORAL | Status: DC
  Administered 2019-01-30 – 2019-02-01 (×3): 100 mg via ORAL
  Filled 2019-01-30 (×4): qty 1

## 2019-01-30 MED ORDER — ALBUTEROL SULFATE (2.5 MG/3ML) 0.083% IN NEBU: 3.0000 mL | INHALATION_SOLUTION | Freq: Four times a day (QID) | RESPIRATORY_TRACT | Status: DC | PRN

## 2019-01-30 MED ORDER — IOHEXOL 350 MG/ML SOLN
50.0000 mL | Freq: Once | INTRAVENOUS | Status: AC | PRN
  Administered 2019-01-30: 50 mL via INTRAVENOUS

## 2019-01-30 MED ORDER — METOPROLOL TARTRATE 50 MG PO TABS
50.0000 mg | ORAL_TABLET | Freq: Two times a day (BID) | ORAL | Status: DC
  Administered 2019-01-30 – 2019-02-01 (×5): 50 mg via ORAL
  Filled 2019-01-30 (×5): qty 1

## 2019-01-30 NOTE — Evaluation (Signed)
Speech Language Pathology Evaluation Patient Details Name: Christian Mccall MRN: [id number] DOB: 05/10/1956 Today's Date: 01/30/2019 Time: [id number] SLP Time Calculation (min) (ACUTE ONLY): 18 min  Problem List:  Patient Active Problem List   Diagnosis Date Noted  . Diplopia 01/30/2019  . Left facial numbness 01/30/2019  . Diabetes ([place]) 01/30/2019  . TIA (transient ischemic attack) 01/30/2019  . CAD (coronary artery disease) of artery bypass graft 07/12/2015  . Abdominal aortic aneurysm (AAA) without rupture ([place])   . Endoleak of aortic graft ([place])   . Soreness-type pain-Abdomin 02/10/2014  . Genital herpes 12/03/2013  . Balanitis 11/30/2013  . Syphilis, late latent 11/24/2013  . Mild cognitive impairment 11/16/2013  . Other and unspecified hyperlipidemia 05/03/2013  . Essential hypertension, benign 05/03/2013  . Weakness of limb-Left >than right 02/04/2013  . Shortness of breath 02/04/2013  . Pain in limb-Bilat leg 02/04/2013  . Hemiplegia affecting nondominant side ([place]) 11/12/2012  . Restless legs syndrome (RLS) 11/12/2012  . Lumbar back pain 11/12/2012  . Transient left leg weakness 11/12/2012  . Back pain 11/12/2012  . Cervicalgia 11/12/2012  . Memory loss 11/12/2012   Past Medical History:  Past Medical History:  Diagnosis Date  . AAA (abdominal aortic aneurysm) ([place])   . Anginal pain ([place])   . Bulging lumbar disc    "& some in my neck"  . C. difficile colitis    2008 OR 09  . CAD (coronary artery disease)   . Change in platelet count    pt states 'has been low since 2012; patient had to receive platelets in surgery in Nov 2016'  . Chronic back pain   . Cirrhosis ([place])   . COPD (chronic obstructive pulmonary disease) ([place])   . Diabetic peripheral neuropathy ([place])   . Emphysema lung ([place])   . Enlarged liver   . Enlarged prostate   . GERD (gastroesophageal reflux disease)   . Hardening of the arteries of the brain   . Headache    "~ weekly" (04/12/2015)  .  Heart attack [facility]) Nov. 15,2016   TIA  . History of colon polyps   . Hyperlipidemia   . Hypertension   . Leg pain    with walking and pain in feet while lying flat  . Memory loss    Due to hardening of the arteries in the brain  . Myocardial infarction ([place])    "I've had probably 6-7" (04/12/2015)  . Restless leg syndrome   . Rheumatic fever    AS A CHILD  ?6 YRS OLD  . RLS (restless legs syndrome)   . Shortness of breath 12/06/10   while lying flat and with exertion  . Snoring   . Spleen enlarged   . Stroke [facility]) 2004   denies residual on 04/12/2015  . Thrombocytopenia ([place])    since 2012; can be as low as 54K.   [name] TIA (transient ischemic attack) "several"   "since 2015" (04/12/2015)  . Type II diabetes mellitus ([place]) dx'd 2009   Past Surgical History:  Past Surgical History:  Procedure Laterality Date  . ABDOMINAL AORTIC ANEURYSM REPAIR  01/16/2011   EVAR  . ABDOMINAL AORTIC ANEURYSM REPAIR  04/12/2015  . CARDIAC CATHETERIZATION  "several"  . COLONOSCOPY    . COLONOSCOPY W/ BIOPSIES    . COLONOSCOPY WITH PROPOFOL N/A 03/21/2016   Procedure: COLONOSCOPY WITH PROPOFOL;  Surgeon: Arta Silence, MD;  Location: [facility] ENDOSCOPY;  Service: Endoscopy;  Laterality: N/A;  . CORONARY ANGIOPLASTY WITH STENT PLACEMENT  "  several"   > 4 sents  . CORONARY ARTERY BYPASS GRAFT  02/18/2001   "CABG x 4"  . Direct sac puncture with liquid embolic repair of Type II endoleak  04/12/2015; 03/05/2017  . ESOPHAGOGASTRODUODENOSCOPY (EGD) WITH PROPOFOL N/A 03/21/2016   Procedure: ESOPHAGOGASTRODUODENOSCOPY (EGD) WITH PROPOFOL;  Surgeon: Arta Silence, MD;  Location: [facility] ENDOSCOPY;  Service: Endoscopy;  Laterality: N/A;  . IR AORTAGRAM ABDOMINAL SERIALOGRAM  03/05/2017  . IR CT SPINE LTD  03/05/2017  . IR EMBO ARTERIAL NOT HEMORR HEMANG INC GUIDE ROADMAPPING  03/05/2017  . IR RADIOLOGIST EVAL & MGMT  02/05/2017  . IR RADIOLOGIST EVAL & MGMT  04/01/2017  . IR RADIOLOGIST EVAL & MGMT  10/01/2017  . LEFT HEART  CATH AND CORS/GRAFTS ANGIOGRAPHY N/A 11/13/2018   Procedure: LEFT HEART CATH AND CORS/GRAFTS ANGIOGRAPHY;  Surgeon: Belva Crome, MD;  Location: [place] CV LAB;  Service: Cardiovascular;  Laterality: N/A;  . PERIPHERAL VASCULAR CATHETERIZATION N/A 03/10/2015   Procedure: Abdominal Aortogram;  Surgeon: Elam Dutch, MD;  Location: [place] CV LAB;  Service: Cardiovascular;  Laterality: N/A;  . RADIOLOGY WITH ANESTHESIA N/A 04/12/2015   Procedure: embolization;  Surgeon: Jacqulynn Cadet, MD;  Location: [place];  Service: Radiology;  Laterality: N/A;  . RADIOLOGY WITH ANESTHESIA N/A 03/05/2017   Procedure: Type II Endoleak Repair;  Surgeon: Jacqulynn Cadet, MD;  Location: [place];  Service: Radiology;  Laterality: N/A;  . SHOULDER SURGERY Right 07-27-2015  . TONSILLECTOMY  ~ 1965   HPI:  Mr Christian Mccall, 63 y/m, presented to [facility] with slight left sided facial numbness and droop, slurred speech, aphasia, numbness was "giggly". PMH significant for GERD, COPD, hypertension, hyperlimipidemia, T2DM w/neuropathy, CAD s/p CABG and cardiac catheterization, AAA s/p endovascular repair, H/O TIA/stroke.    Assessment / Plan / Recommendation Clinical Impression  Patient presents with baseline cognitive and language abilities. His wife was present and able to give history of patient's abilities. Pt initially presented to ED with slurred speech and left facial numbness that has resolved. He reports some fullness in his left jaw. No dysarthria or facial droop noted or oral motor sensory evaluation. His cognition and language abitilies were judged in conversation to be [facility]. His MOCA score was 27/30 (26 or greater being WNL). No further ST services recommended at this time as his abilities are conisistent with his baseline abilities.     SLP Assessment  SLP Recommendation/Assessment: Patient does not need any further Speech Lanaguage Pathology Services SLP Visit Diagnosis:  Cognitive communication deficit (R41.841)    Follow Up Recommendations  None               SLP Evaluation Cognition  Overall Cognitive Status: Within Functional Limits for tasks assessed Arousal/Alertness: Awake/alert Orientation Level: Oriented X4 Attention: Focused Focused Attention: Appears intact Memory: Appears intact Awareness: Appears intact Problem Solving: Appears intact Executive Function: Reasoning Reasoning: Appears intact Safety/Judgment: Appears intact       Comprehension  Auditory Comprehension Overall Auditory Comprehension: Appears within functional limits for tasks assessed Yes/No Questions: Within Functional Limits Commands: Within Functional Limits Conversation: Complex Visual Recognition/Discrimination Discrimination: Within Function Limits Reading Comprehension Reading Status: Within funtional limits    Expression Expression Primary Mode of Expression: Verbal Verbal Expression Overall Verbal Expression: Appears within functional limits for tasks assessed Initiation: No impairment Level of Generative/Spontaneous Verbalization: Conversation Repetition: No impairment Naming: No impairment Pragmatics: No impairment Written Expression Dominant Hand: Right Written Expression: Within Functional Limits  Oral / Motor  Oral Motor/Sensory Function Overall Oral Motor/Sensory Function: Within functional limits Motor Speech Overall Motor Speech: Appears within functional limits for tasks assessed Respiration: Within functional limits Phonation: Normal Resonance: Within functional limits Articulation: Within functional limitis Intelligibility: Intelligible Motor Planning: Witnin functional limits Motor Speech Errors: Not applicable   [place], MA, CCC-SLP 01/30/2019 11:33 AM

## 2019-01-30 NOTE — Progress Notes (Signed)
Patient seen and examined in the morning rounds.  Patient with extensive medical problems including type 2 diabetes, chronic thrombocytopenia thought to be from liver disease, chronic liver disease on surveillance for some liver cyst,Hyperlipidemia, history of TIAs and a stroke admitted with acute onset of persistent diplopia that has improved now.  Has a history of multiple strokes as per patient in the past.  MRI shows multiple old lacunar infarcts.  A 5 mm subacute looking infarction posterior right frontal region. Currently improving. Plan: Continue neurochecks vital signs as per stroke protocol.  Neurology recommendations.

## 2019-01-30 NOTE — Progress Notes (Signed)
STROKE TEAM PROGRESS NOTE   INTERVAL HISTORY His wife is at the bedside.  They recounted HPI with me. Pt stated that he had CABG 18 years ago.  2 to 3 months after the procedure, he started to have episode of mild headache, left ptosis, left face numbness, droopy, and left arm and leg weakness to the point not up to move at all.  This episodes happens sometimes every 2 to 3 months, or 2 or 3 times every months. The episode typically lasting 10 to 15 minutes. After each episode, he will be very tired and need to sleep for hours.  He denies any shaking jerking, loss of consciousness, confusion or aphasia.  He has been told to have TIAs over the years but MRI certainly did not show significant for strokes.    Yesterday he was driving, he had acute onset of double vision.  Double vision occurs on both sides of left or right gaze, as well as looking straight.  If he covers one eye or the other, he still able to see double.  This followed by again left proptosis and left side weakness.  However no significant headache yesterday.  Yesterday episode lasting about several hours before resolution.  This morning, patient initially at his normal baseline, able to talk to me without problem.  After my interview, he worked with PT in the hallway, on back to room sitting in bed he had another episode of left proptosis, left facial numbness droopy and left arm leg not able to move.  By the time I saw him, his symptoms started to resolve, but still has left proptosis, left arm and leg mild weakness, no aphasia or speech difficulty.  He felt drowsy sleepy and like to sleep.  He stated that his mom had similar episodes in her life but eventually she had a big stroke.  He denies any similar episode in his brothers or sisters.    OBJECTIVE Vitals:   01/30/19 0200 01/30/19 0400 01/30/19 0600 01/30/19 0800  BP: 136/79 138/75 (!) 120/57 130/72  Pulse: 63 62 64   Resp: 16 16 18    Temp: 98.1 F (36.7 C) 98 F (36.7 C) (!)  97.4 F (36.3 C) (!) 97.2 F (36.2 C)  TempSrc: Oral Oral Oral Oral  SpO2: 98% 98% 98% 97%  Weight:      Height:        CBC:  Recent Labs  Lab 01/30/19 0415  WBC 4.2  HGB 14.2  HCT 41.6  MCV 91.4  PLT 50*    Basic Metabolic Panel:  Recent Labs  Lab 01/30/19 0415  NA 136  K 3.5  CL 102  CO2 24  GLUCOSE 166*  BUN 10  CREATININE 0.73  CALCIUM 8.6*    Lipid Panel:     Component Value Date/Time   CHOL 103 01/30/2019 0412   TRIG 267 (H) 01/30/2019 0412   HDL 25 (L) 01/30/2019 0412   CHOLHDL 4.1 01/30/2019 0412   VLDL 53 (H) 01/30/2019 0412   LDLCALC 25 01/30/2019 0412   HgbA1c:  Lab Results  Component Value Date   HGBA1C 8.8 (H) 01/30/2019   Urine Drug Screen: No results found for: LABOPIA, COCAINSCRNUR, LABBENZ, AMPHETMU, THCU, LABBARB  Alcohol Level No results found for: ETH  IMAGING  Ct Angio Head W Or Wo Contrast Ct Angio Neck W Or Wo Contrast 01/30/2019 IMPRESSION:  1. Approximately 50% stenosis of the proximal right ICA secondary to prominent noncalcified plaque.  2. Atherosclerotic changes at  the aortic arch and within the common carotid arteries bilaterally without significant stenosis.  3. Atherosclerotic calcifications within the cavernous ICA bilaterally. A 50% stenosis is present on the left.  4. Mild distal disease without a significant proximal stenosis, aneurysm, or branch vessel occlusion within the circle-of-Willis.  5. 50% stenosis at the origin of the right vertebral artery.  6. Mild degenerative changes of the cervical spine.    Mr Brain Wo Contrast 01/30/2019 IMPRESSION:  1. No acute intracranial abnormality identified.  2. 5 mm focus of mildly increased diffusion signal within the subcortical white matter of the posterior right frontal region, which could reflect a small focus of artifact/T2 shine through or possibly late subacute ischemia. Finding is not felt to be acute in nature.  By my read, the described focus likely to be T2  shine through instead of subacute stroke. 3. Underlying moderate chronic microvascular ischemic disease with multiple remote lacunar infarcts involving the right caudate, progressed relative to 2015.    Transthoracic Echocardiogram  Pending  EEG - pending   PHYSICAL EXAM  Temp:  [97.2 F (36.2 C)-98.1 F (36.7 C)] 97.7 F (36.5 C) (08/22 1200) Pulse Rate:  [60-64] 64 (08/22 0600) Resp:  [16-59] 59 (08/22 1200) BP: (120-141)/(57-79) 133/73 (08/22 1200) SpO2:  [95 %-99 %] 97 % (08/22 1200) Weight:  [94.1 kg] 94.1 kg (08/22 0011)  General - Well nourished, well developed, in no apparent distress.  Ophthalmologic - fundi not visualized due to noncooperation.  Cardiovascular - Regular rate and rhythm.  Mental Status -  Level of arousal and orientation to time, place, and person were intact. Language including expression, naming, repetition, comprehension was assessed and found intact. Fund of Knowledge was assessed and was intact.  Cranial Nerves II - XII - II - Visual field intact OU. III, IV, VI - Extraocular movements intact. V - Facial sensation intact bilaterally. VII - Facial movement intact bilaterally. VIII - Hearing & vestibular intact bilaterally. X - Palate elevates symmetrically. XI - Chin turning & shoulder shrug intact bilaterally. XII - Tongue protrusion intact.  Motor Strength - The patient's strength was normal in all extremities and pronator drift was absent.  Bulk was normal and fasciculations were absent.   Motor Tone - Muscle tone was assessed at the neck and appendages and was normal.  Reflexes - The patient's reflexes were symmetrical in all extremities and he had no pathological reflexes.  Sensory - Light touch, temperature/pinprick were assessed and were symmetrical.    Coordination - The patient had normal movements in the hands with no ataxia or dysmetria.  Tremor was absent.  Gait and Station - deferred.   ASSESSMENT/PLAN [name] is a 63 y.o. male with history of hypertension, hyperlipidemia, cirrhosis, AAA, COPD, hx of thrombocytopenia, DM, CAD (MI / stents / CABG), hyperlipidemia, and multiple TIAs  presenting with transient double vision and unsteady gait.[name] He did not receive IV t-PA due to resolution of deficits.   Recurrent episode likely hemiplegic migraine, however, patient does have significant stroke risk factors.  Resultant episode resolved  MRI head - Underlying moderate chronic microvascular ischemic disease with multiple remote lacunar infarcts involving the right caudate head, progressed relative to 2015.   CTA H&N - 50% stenosis bilateral ICAs and 50% stenosis origin of right VA.  2D Echo - pending  EEG pending to rule out seizure  Novel Coronavirus - not detected   LDL - 25  HgbA1c - 8.8  UDS negative  VTE prophylaxis -  SCDs  aspirin 81 mg daily and clopidogrel 75 mg daily prior to admission, now on aspirin 81 mg daily.  Plavix discontinued for now due to thrombocytopenia with platelet 50.  Once platelet more than 75, Plavix can be resumed.  Patient counseled to be compliant with his antithrombotic medications  Ongoing aggressive stroke risk factor management  Therapy recommendations:  pending  Disposition:  Pending  ??  Hemiplegic migraine  Recurrent frequent episode of left proptosis, left facial numbness weakness, left arm and leg weakness, occurring in sequence, lasting 10 to 15 minutes with mild headache for the last 18 years.  Stereotypical presentation  Mom have similar episodes  MRI no significant infarcts  EEG pending to rule out seizure  Will start trial of verapamil.  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  On metoprolol, ARB, Lasix  Thrombocytopenia  Following with oncologist Dr.  Alen Blew  Platelet 70->100->50  Hold off Plavix for now.  Once platelet more than 75, may resume Plavix  Continue close follow-up with Dr. Alen Blew  Hypertension  Home BP meds: Cozaar 50 mg QD, Hytrin 1 mg QD, metoprolol 50 mg Bid  Current BP meds: Cozaar 50 mg QD, Hytrin 1 mg QD, metoprolol 50 mg Bid  Stable  On verapamil CR 120  Close BP monitoring . Long-term BP goal normotensive  Hyperlipidemia  Home Lipid lowering medication:  Vytorin (Zetia / Zocor) 10/40 daily and fenofibrate 160 mg daily  LDL 25, goal < 70  Current lipid lowering medication:  Vytorin (Zetia / Zocor) 10/40 daily and fenofibrate 160 mg daily  Continue above at discharge  Diabetes  HgbA1c 8.8, goal < 7.0  Uncontrolled  SSI  CBG monitoring  On Lantus  Close PCP follow-up  Other Stroke Risk Factors  Advanced age  Former cigarette smoker - quit  ETOH use, advised to drink no more than 1 alcoholic beverage per day.  AAA with endovascular repair and known endoleak2012  Other Active Problems  Thrombocytopenia hx - 50  Severe COPD with use of nocturnal oxygen  Hospital day # 0  I spent  35 minutes in total face-to-face time with the patient, more than 50% of which was spent in counseling and coordination of care, reviewing test results, images and medication, and discussing the diagnosis of frequent recurrent episodes, question for hemorrhagic migraine, severe thrombocytopenia, hyperglycemia, diabetes, CAD/MI treatment plan and potential prognosis. This patient's care requiresreview of multiple databases, neurological assessment, discussion with family, other specialists and medical decision making of high complexity. I had long discussion with patient and wife at bedside, updated pt current condition, treatment plan and potential prognosis. They expressed understanding and appreciation.   Rosalin Hawking, MD PhD Stroke Neurology 01/30/2019 2:39 PM  To contact Stroke Continuity provider,  please refer to [email or web address] After hours, contact General Neurology

## 2019-01-30 NOTE — Evaluation (Signed)
Physical Therapy Evaluation Patient Details Name: Christian Mccall MRN: [id number] DOB: 1955-11-08 Today's Date: 01/30/2019   History of Present Illness  Christian Mccall is a 63 y.o. male with a history of DM, hypertension, hyperlipidemia, CAD, COPD, emphysema, cirrhosis, thrombocytopenia, AAA, TIA who presents with transient double vision and unsteady gait. MRI showed multiple subacute infarcts but no acute intracranial abnormality   Clinical Impression  Pt admitted with above diagnosis. At the beginning of eval pt was not having symptoms. He has some mild LLE weakness at baseline but MMT 4+/5. Pt ambulated 250' and ascended 5 steps without difficulty and no diplopia or dizziness with horizontal or vertical head turns or changes in direction. However, after he finished coming down steps, his countenance changed. He ambulated back to room but he stopped conversing and by the time he sat down he had L facial droop. He had posterior imbalance in sitting and needed min A to achieve supine. At that time he had difficulty lifting L arm and LLE strength was <3/5. He was slowly responsive and did not speak unless spoken to. RN and MD called in. Difficult to make a recommendation at this point given the transient nature of his symptoms but will likely need some form of post acute therapy.  Pt currently with functional limitations due to the deficits listed below (see PT Problem List). Pt will benefit from skilled PT to increase their independence and safety with mobility to allow discharge to the venue listed below.       Follow Up Recommendations Home health PT    Equipment Recommendations  None recommended by PT    Recommendations for Other Services       Precautions / Restrictions Precautions Precautions: Fall Precaution Comments: had double vision yesterday and L weakness from multiple TIA's Restrictions Weight Bearing Restrictions: No      Mobility  Bed Mobility Overal bed mobility: Needs  Assistance Bed Mobility: Supine to Sit;Sit to Supine     Supine to sit: Modified independent (Device/Increase time) Sit to supine: Min assist   General bed mobility comments: pt came to EOB independently. However, after ambulation when he had cognitive and physical changes, he needed min A to guide him back to bed because was not following commands quickly and had posterior lean  Transfers Overall transfer level: Modified independent Equipment used: None                Ambulation/Gait Ambulation/Gait assistance: Supervision Gait Distance (Feet): 250 Feet Assistive device: None Gait Pattern/deviations: Decreased weight shift to left Gait velocity: WFL Gait velocity interpretation: >4.37 ft/sec, indicative of normal walking speed General Gait Details: noted mild LLE lag during swing through, no LOB, no diplopia with horizontal or vertical changes in gaze  Stairs Stairs: Yes Stairs assistance: Supervision Stair Management: One rail Right;Alternating pattern;Forwards Number of Stairs: 5 General stair comments: pt did not have difficulty with stairs with use of rail, however, after stairs, he was less talkative and had a "blank" look on his face, after he returned to room and sat down he began having more decreased responsiveness and increased L weakness. HR 65 bpm throughout  Wheelchair Mobility    Modified Rankin (Stroke Patients Only) Modified Rankin (Stroke Patients Only) Pre-Morbid Rankin Score: No significant disability Modified Rankin: Moderate disability     Balance Overall balance assessment: Needs assistance Sitting-balance support: No upper extremity supported Sitting balance-Leahy Scale: Poor Sitting balance - Comments: after walking had noted posterior lean and needed min guard A  for safety Postural control: Posterior lean Standing balance support: No upper extremity supported Standing balance-Leahy Scale: Poor Standing balance comment: steady before  episode but needed support after stairs                             Pertinent Vitals/Pain Pain Assessment: No/denies pain    Home Living Family/patient expects to be discharged to:: Private residence Living Arrangements: Spouse/significant other Available Help at Discharge: Family;Available 24 hours/day Type of Home: House Home Access: Stairs to enter Entrance Stairs-Rails: Right Entrance Stairs-Number of Steps: 2(5 for camper) Home Layout: One level   Additional Comments: pt and wife spend a lot of time in their RV    Prior Function Level of Independence: Independent               Hand Dominance   Dominant Hand: Right    Extremity/Trunk Assessment   Upper Extremity Assessment Upper Extremity Assessment: Defer to OT evaluation    Lower Extremity Assessment Lower Extremity Assessment: LLE deficits/detail LLE Deficits / Details: pt has mild LLE weakness at baseline, reports that he sometimes "drags" LLE when he's tired but MMT 4+/5. However, after ambulation, LLE strength <3/5 LLE Sensation: decreased proprioception;decreased light touch LLE Coordination: decreased gross motor    Cervical / Trunk Assessment Cervical / Trunk Assessment: Normal  Communication   Communication: No difficulties  Cognition Arousal/Alertness: Awake/alert;Lethargic(alert before episode, lethargic after) Behavior During Therapy: WFL for tasks assessed/performed Overall Cognitive Status: Impaired/Different from baseline Area of Impairment: Problem solving;Following commands                       Following Commands: Follows one step commands with increased time     Problem Solving: Slow processing;Decreased initiation General Comments: pt was [facility] prior to ambulation but afterwards experienced cognitive changes including slow speech, decreased attention, and physical deficits mentioned below      General Comments General comments (skin integrity, edema, etc.): RN  and MD present after session when pt began having physical and cognitive changes. Noted L facial droop and pt reported fatigue. He could answer questions appropriately but slowly and stopped speaking unless spoken to first. Significant change compared to before ambulation. Wife reports that these episodes occur with and without activity.     Exercises     Assessment/Plan    PT Assessment Patient needs continued PT services  PT Problem List Decreased strength;Decreased activity tolerance;Decreased balance;Decreased mobility;Decreased coordination;Decreased cognition;Decreased safety awareness       PT Treatment Interventions DME instruction;Gait training;Stair training;Functional mobility training;Therapeutic activities;Therapeutic exercise;Balance training;Patient/family education    PT Goals (Current goals can be found in the Care Plan section)  Acute Rehab PT Goals Patient Stated Goal: figure out what is causing these episodes PT Goal Formulation: With patient/family Time For Goal Achievement: 02/13/19 Potential to Achieve Goals: Good    Frequency Min 3X/week   Barriers to discharge        Co-evaluation               AM-PAC PT "6 Clicks" Mobility  Outcome Measure Help needed turning from your back to your side while in a flat bed without using bedrails?: None Help needed moving from lying on your back to sitting on the side of a flat bed without using bedrails?: None Help needed moving to and from a bed to a chair (including a wheelchair)?: A Little Help needed standing up from a chair using  your arms (e.g., wheelchair or bedside chair)?: A Little Help needed to walk in hospital room?: A Little Help needed climbing 3-5 steps with a railing? : A Little 6 Click Score: 20    End of Session Equipment Utilized During Treatment: Gait belt Activity Tolerance: Treatment limited secondary to medical complications (Comment) Patient left: in bed;with call bell/phone within  reach;with family/visitor present;with nursing/sitter in room Nurse Communication: Mobility status PT Visit Diagnosis: Unsteadiness on feet (R26.81);Hemiplegia and hemiparesis Hemiplegia - Right/Left: Left Hemiplegia - dominant/non-dominant: Non-dominant Hemiplegia - caused by: Unspecified    Time: [id number] PT Time Calculation (min) (ACUTE ONLY): 28 min   Charges:   PT Evaluation $PT Eval Moderate Complexity: 1 Mod PT Treatments $Gait Training: 8-22 mins        Leighton Roach, PT  Acute Rehab Services  Pager [phone] Office [place] 01/30/2019, 2:35 PM

## 2019-01-30 NOTE — Progress Notes (Signed)
  Echocardiogram 2D Echocardiogram has been performed.  Merrie Roof F 01/30/2019, 1:35 PM

## 2019-01-30 NOTE — Plan of Care (Signed)
  Problem: Activity: Goal: Risk for activity intolerance will decrease Outcome: Progressing   

## 2019-01-30 NOTE — H&P (Addendum)
TRH H&P    Patient Demographics:    Christian Mccall, is a 63 y.o. male  MRN: [id number]  DOB - 01-21-1956  Admit Date - 01/30/2019  Referring MD/NP/PA: [place] Primary MD for the patient is Shon Baton, MD  Patient coming from:    Home-> [facility] ED  Chief complaint-  diplopia   HPI:    Christian Mccall  is a 63 y.o. male, w Jerrye Bushy, Copd,  hypertension, hyperlipidemia, Dm2 w neuropathy, CAD s/p CABG,  S/p cardiac catheterization radial approach on 11/13/2018, AAA s/p endovascular repair, complicated by leaks,  h/o TIA/ Stroke, presents with c/o diplopia starting around noon 01/29/2019.  Pt states symptoms lasted until about 9-10:30pm this evening.  Pt also noted slight left sided facial numbness, and droop which started at the same time as double vision but resolved about 2 hours earlier. He was also was "giggly".  Pt denies etoh use or drug use.  Pt states his balance was off today .  Pt denies slurred speech, aphasia, numbness, tingling weakness of the arms or legs.  Pt denies headache.  Pt seen by teleneurology at [facility] which recommended evaluation for stroke.  Apparently no echo capability at [facility] over the weekend so patient transferred to [facility] for evaluation.   In ED,  T 96.2, P 60 Bp 145/68  RR 18 pox 95% Wt 213.18lbs  Cepheid test for covid -19 negative  PTT 25.9,  INR 1.1 Trop <0.017 Wbc 5.2, Hgb 15.2, Plt 75 Glucose 296 Bun 15, Creatinine 1.1 Na 136, K 4.6,  Alb 3.7, Ast 43, Alt 57, Alk phos 79, T. 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  Total occlusion of the proximal to mid LAD.  Total occlusion of the first obtuse marginal and diffuse disease in the mid circumflex and second obtuse marginal, up to 70 to 80% in spots.  Ostial occlusion of the right coronary  Patent left main  Patent bypass graft to the distal RCA  Patent bypass graft to the first obtuse marginal  Patent bypass graft to the first diagonal with retrofilling of the mid to distal LAD  Either atresia or total occlusion of the LIMA.  RECOMMENDATIONS:   Continue aggressive risk factor modification.  Uptitrate anti-ischemic therapy as needed to control symptoms.   Review of systems:    In addition to the HPI above,  No Fever-chills, No Headache, No changes with Vision or hearing, No problems swallowing food or Liquids, No Chest pain, Cough or Shortness of Breath, No Abdominal pain, No Nausea or Vomiting, bowel movements are regular, No Blood in stool  or Urine, No dysuria, No new skin rashes or bruises, No new joints pains-aches,  No new weakness, tingling, numbness in any extremity, No recent weight gain or loss, No polyuria, polydypsia or polyphagia, No significant Mental Stressors.  All other systems reviewed and are negative.    Past History of the following :    Past Medical History:  Diagnosis Date  . AAA (abdominal aortic aneurysm) ([place])   . Anginal pain ([place])   . Bulging lumbar disc    "& some in my neck"  . C. difficile colitis    2008 OR 09  . CAD (coronary artery disease)   . Change in platelet count    pt states 'has been low since 2012; patient had to receive platelets in surgery in Nov 2016'  . Chronic back pain   . Cirrhosis ([place])   . COPD (chronic obstructive pulmonary disease) ([place])   . Diabetic peripheral neuropathy ([place])   . Emphysema lung ([place])   . Enlarged liver   . Enlarged prostate   . GERD  (gastroesophageal reflux disease)   . Hardening of the arteries of the brain   . Headache    "~ weekly" (04/12/2015)  . Heart attack [facility]) Nov. 15,2016   TIA  . History of colon polyps   . Hyperlipidemia   . Hypertension   . Leg pain    with walking and pain in feet while lying flat  . Memory loss    Due to hardening of the arteries in the brain  . Myocardial infarction ([place])    "I've had probably 6-7" (04/12/2015)  . Restless leg syndrome   . Rheumatic fever    AS A CHILD  ?6 YRS OLD  . RLS (restless legs syndrome)   . Shortness of breath 12/06/10   while lying flat and with exertion  . Snoring   . Spleen enlarged   . Stroke [facility]) 2004   denies residual on 04/12/2015  . Thrombocytopenia ([place])    since 2012; can be as low as 54K.   [name] TIA (transient ischemic attack) "several"   "since 2015" (04/12/2015)  . Type II diabetes mellitus ([place]) dx'd 2009      Past Surgical History:  Procedure Laterality Date  . ABDOMINAL AORTIC ANEURYSM REPAIR  01/16/2011   EVAR  . ABDOMINAL AORTIC ANEURYSM REPAIR  04/12/2015  . CARDIAC CATHETERIZATION  "several"  . COLONOSCOPY    . COLONOSCOPY W/ BIOPSIES    . COLONOSCOPY WITH PROPOFOL N/A 03/21/2016   Procedure: COLONOSCOPY WITH PROPOFOL;  Surgeon: Arta Silence, MD;  Location: [facility] ENDOSCOPY;  Service: Endoscopy;  Laterality: N/A;  . CORONARY ANGIOPLASTY WITH STENT PLACEMENT  "several"   > 4 sents  . CORONARY ARTERY BYPASS GRAFT  02/18/2001   "CABG x 4"  . Direct sac puncture with liquid embolic repair of Type II endoleak  04/12/2015; 03/05/2017  . ESOPHAGOGASTRODUODENOSCOPY (EGD) WITH PROPOFOL N/A 03/21/2016   Procedure: ESOPHAGOGASTRODUODENOSCOPY (EGD) WITH PROPOFOL;  Surgeon: Arta Silence, MD;  Location: [facility] ENDOSCOPY;  Service: Endoscopy;  Laterality: N/A;  . IR AORTAGRAM ABDOMINAL SERIALOGRAM  03/05/2017  . IR CT SPINE LTD  03/05/2017  . IR EMBO ARTERIAL NOT HEMORR HEMANG INC GUIDE ROADMAPPING  03/05/2017  . IR RADIOLOGIST EVAL & MGMT   02/05/2017  . IR RADIOLOGIST EVAL & MGMT  04/01/2017  . IR RADIOLOGIST EVAL & MGMT  10/01/2017  . LEFT HEART CATH AND CORS/GRAFTS ANGIOGRAPHY N/A 11/13/2018   Procedure: LEFT HEART CATH AND CORS/GRAFTS ANGIOGRAPHY;  Surgeon: Belva Crome, MD;  Location: [place] CV LAB;  Service: Cardiovascular;  Laterality: N/A;  . PERIPHERAL VASCULAR CATHETERIZATION N/A 03/10/2015   Procedure: Abdominal Aortogram;  Surgeon: Elam Dutch, MD;  Location: [place] CV LAB;  Service: Cardiovascular;  Laterality: N/A;  . RADIOLOGY WITH ANESTHESIA N/A 04/12/2015   Procedure: embolization;  Surgeon: Jacqulynn Cadet, MD;  Location: [place];  Service: Radiology;  Laterality: N/A;  . RADIOLOGY WITH ANESTHESIA N/A 03/05/2017   Procedure: Type II Endoleak Repair;  Surgeon: Jacqulynn Cadet, MD;  Location: [place];  Service: Radiology;  Laterality: N/A;  . SHOULDER SURGERY Right 07-27-2015  . TONSILLECTOMY  ~ 71      Social History:      Social History   Tobacco Use  . Smoking status: Former Smoker    Packs/day: 0.00    Years: 30.00    Pack years: 0.00    Types: Cigarettes    Quit date: 02/17/2001    Years since quitting: 17.9  . Smokeless tobacco: Never Used  Substance Use Topics  . Alcohol use: Yes    Alcohol/week: 1.0 standard drinks    Types: 1 Cans of beer per week    Comment: occasional       Family History :     Family History  Problem Relation Age of Onset  . Aneurysm Mother        AAA  . Heart disease Mother        Heart Disease before age 88  . Other Mother        Carotid artery stenosis  . Hyperlipidemia Mother   . Hypertension Mother   . Lupus Sister   . Cancer Sister        Ovarian  . Heart disease Sister   . Heart disease Brother        Heart Disease before age 98  . Hypertension Brother   . Heart attack Brother   . Heart disease Brother        Before age 43  . Heart attack Brother   . Heart attack Brother   . Hyperlipidemia Father   . Hypertension Father   .  Aneurysm Maternal Aunt        AAA  . Other Maternal Aunt        AAA  . Aneurysm Maternal Uncle        AAA  . Other Maternal Uncle        AAA       Home Medications:   Prior to Admission medications   Medication Sig Start Date End Date Taking? Authorizing Provider  albuterol (PROVENTIL HFA;VENTOLIN HFA) 108 (90 BASE) MCG/ACT inhaler Inhale 2 puffs into the lungs every 6 (six) hours as needed for wheezing or shortness of breath.     [provider]  aspirin EC 81 MG tablet Take 1 tablet (81 mg total) by mouth daily. 03/22/16   Arta Silence, MD  Cholecalciferol (VITAMIN D) 2000 units tablet Take 2,000 Units by mouth 2 (two) times daily.     [provider]  clonazePAM (KLONOPIN) 1 MG tablet Take 1 mg by mouth 2 (two) times daily.     [provider]  clopidogrel (PLAVIX) 75 MG tablet Take 1 tablet (75 mg total) by mouth daily. 03/25/16   Arta Silence, MD  cyanocobalamin (,VITAMIN B-12,) 1000 MCG/ML injection Inject 1,000 mcg into the muscle See admin instructions. Every 30 to 60 days    [provider]  diphenhydrAMINE (  BENADRYL) 25 MG tablet Take 25 mg by mouth daily as needed for allergies.    [provider]  empagliflozin (JARDIANCE) 10 MG TABS tablet Take 10 mg by mouth daily.    [provider]  escitalopram (LEXAPRO) 20 MG tablet Take 20 mg by mouth at bedtime.    [provider]  ezetimibe-simvastatin (VYTORIN) 10-40 MG per tablet Take 1 tablet by mouth at bedtime.     [provider]  fenofibrate 160 MG tablet Take 160 mg by mouth daily.  11/03/10   [provider]  Fluticasone-Salmeterol (ADVAIR DISKUS) 100-50 MCG/DOSE AEPB Advair Diskus 100 mcg-50 mcg/dose powder for inhalation    [provider]  furosemide (LASIX) 40 MG tablet Take 40 mg by mouth every other day.     [provider]  glimepiride (AMARYL) 4 MG tablet Take 4 mg by mouth every evening.     [provider]  lansoprazole (PREVACID) 30 MG capsule Take 30 mg by mouth daily.  03/11/14   [provider]  LANTUS SOLOSTAR 100 UNIT/ML Solostar Pen Inject 17 Units into the skin at bedtime.  09/04/18   [provider]  losartan (COZAAR) 50 MG tablet Take 50 mg by mouth daily.      [provider]  metFORMIN (GLUCOPHAGE) 1000 MG tablet Take 1,000 mg by mouth daily with breakfast.     [provider]  metoprolol (LOPRESSOR) 50 MG tablet Take 50 mg by mouth 2 (two) times daily.     [provider]  nitroGLYCERIN (NITRODUR - DOSED IN MG/24 HR) 0.4 mg/hr patch Apply patch in the morning, remove at bedtime 12/07/18   Isaiah Serge, NP  nitroGLYCERIN (NITROSTAT) 0.4 MG SL tablet Place 1 tablet (0.4 mg total) under the tongue every 5 (five) minutes as needed for chest pain. 10/08/18   Belva Crome, MD  oxyCODONE-acetaminophen (PERCOCET) 5-325 MG tablet Take 1 tablet by mouth every 4 (four) hours as needed (max 6 q). 03/19/18   Shuford, Olivia Mackie, PA-C  OXYGEN Inhale into the lungs. 2L at night    [provider]  pramipexole (MIRAPEX) 0.25 MG tablet Take 0.25 mg by mouth at bedtime.  03/24/12   [provider]  pregabalin (LYRICA) 100 MG capsule Take 100-200 mg by mouth See admin instructions. Take 1 tablet (100 mg) by mouth every morning and 2 tablets (200 mg) at bedtime    [provider]  ranolazine (RANEXA) 1000 MG SR tablet Take 1 tablet (1,000 mg total) by mouth 2 (two) times daily. 01/21/19   Belva Crome, MD  terazosin (HYTRIN) 1 MG capsule Take 1 mg by mouth at bedtime.     [provider]  tiotropium (SPIRIVA) 18 MCG inhalation capsule Place 18 mcg into inhaler and inhale daily.     [provider]     Allergies:    No Known Allergies   Physical Exam:   Vitals  Blood pressure (!) 141/77, pulse 60, temperature (!) 97.5 F (36.4 C), temperature source Oral, resp. rate 18, height '5\' 11"'$  (1.803 m), weight 94.1 kg,  SpO2 95 %.  1.  General: aoxo3  2. Psychiatric: euthymic  3. Neurologic: Cn2-12 intact, reflexes 2+ symmetric, diffuse with no clonus, downgoing toes bialterally, motor 5/5 in all 4 ext  4. HEENMT:  Anicteric, pupils 1.23m symmetric, direct, consensual, near intact No facial droop, tongue midline Neck: no jvd, no bruit  5. Respiratory : CTAB  6. Cardiovascular : Midline scar, rrr  s1, s2, no m/g/r  7. Gastrointestinal:  Abd: soft, nt, nd, +bs  8. Skin:  Ext: no c/c/e,  No rash  9.Musculoskeletal:  Good ROM,  No adenopathy    Data Review:    CBC No results for input(s): WBC, HGB, HCT, PLT, MCV, MCH, MCHC, RDW, LYMPHSABS, MONOABS, EOSABS, BASOSABS, BANDABS in the last 168 hours.  Invalid input(s): NEUTRABS, BANDSABD ------------------------------------------------------------------------------------------------------------------  No results found for this or any previous visit (from the past 68 hour(s)).  Chemistries  No results for input(s): NA, K, CL, CO2, GLUCOSE, BUN, CREATININE, CALCIUM, MG, AST, ALT, ALKPHOS, BILITOT in the last 168 hours.  Invalid input(s): GFRCGP ------------------------------------------------------------------------------------------------------------------  ------------------------------------------------------------------------------------------------------------------ GFR: CrCl cannot be calculated (Patient's most recent lab result is older than the maximum 21 days allowed.). Liver Function Tests: No results for input(s): AST, ALT, ALKPHOS, BILITOT, PROT, ALBUMIN in the last 168 hours. No results for input(s): LIPASE, AMYLASE in the last 168 hours. No results for input(s): AMMONIA in the last 168 hours. Coagulation Profile: No results for input(s): INR, PROTIME in the last 168 hours. Cardiac Enzymes: No results for input(s): CKTOTAL, CKMB, CKMBINDEX, TROPONINI in the last 168 hours. BNP (last 3 results) No results for input(s):  PROBNP in the last 8760 hours. HbA1C: No results for input(s): HGBA1C in the last 72 hours. CBG: No results for input(s): GLUCAP in the last 168 hours. Lipid Profile: No results for input(s): CHOL, HDL, LDLCALC, TRIG, CHOLHDL, LDLDIRECT in the last 72 hours. Thyroid Function Tests: No results for input(s): TSH, T4TOTAL, FREET4, T3FREE, THYROIDAB in the last 72 hours. Anemia Panel: No results for input(s): VITAMINB12, FOLATE, FERRITIN, TIBC, IRON, RETICCTPCT in the last 72 hours.  --------------------------------------------------------------------------------------------------------------- Urine analysis:    Component Value Date/Time   COLORURINE YELLOW 01/06/2017 1402   APPEARANCEUR CLEAR 01/06/2017 1402   LABSPEC 1.029 01/06/2017 1402   PHURINE 6.0 01/06/2017 1402   GLUCOSEU >=500 (A) 01/06/2017 1402   HGBUR NEGATIVE 01/06/2017 1402   BILIRUBINUR NEGATIVE 01/06/2017 1402   KETONESUR 5 (A) 01/06/2017 1402   PROTEINUR NEGATIVE 01/06/2017 1402   UROBILINOGEN 0.2 01/14/2011 1351   NITRITE NEGATIVE 01/06/2017 1402   LEUKOCYTESUR NEGATIVE 01/06/2017 1402      Imaging Results:    No results found.     Assessment & Plan:    Principal Problem:   Diplopia Active Problems:   Essential hypertension, benign   CAD (coronary artery disease) of artery bypass graft   Left facial numbness   Diabetes (HCC)   TIA (transient ischemic attack)  Diplopia , L facial numbness secondary to TIA r/o CVA Tele MRI brain Carotid ultrasound Cardiac echo Hga1c, lipid Cont aspirin Cont Plavix,  Cont Vytorin, Cont Fenofibrate Cont PT/OT, speech consult Neurology consult  Dm2 with neuropathy Cont Metformin '1000mg'$  po bid Cont Amaryl '4mg'$  po qday Cont Jardiance '10mg'$  po qday Cont Lantus Cont Lyrica   Hypertension, CAD  Cont Losartan '50mg'$  po qday Cont Metoprolol '50mg'$  po bid Cont Lasix '40mg'$  po qday Cont Ranexa '1000mg'$  po qday  Anxiety Cont Lexapro '20mg'$  po qday Cont Clonazepam '1mg'$  po  bid  Gerd Cont PPI  Bph Cont Terazosin '1mg'$  po qhs  Copd  Cont Spiriva 1puff qday Cont Advair 1puff bid  RLS Cont mirapaex  H/o ? Liver lesion Check MR Abdomen w and without contrast   DVT Prophylaxis-   SCDs  AM Labs Ordered, also please review Full Orders  Family Communication: Admission, patients condition and plan of care including tests being ordered have been discussed with  the patient and wife who indicate understanding and agree with the plan and Code Status.  Code Status:  FULL CODE, wife present with patient  Admission status: Inpatient: Based on patients clinical presentation and evaluation of above clinical data, I have made determination that patient meets Inpatient criteria at this time.  Pt has high risk of clinical deterioration.  Pt has TIA symptoms and possibly CVA,  Pt has high risk of CVA.  (hypertension, prior CVA, prior TIA, CAD), , pt will require w/up of r/o stroke and this will likely be > 2 nites stay.   Time spent in minutes :  70   Jani Gravel M.D on 01/30/2019 at 1:06 AM

## 2019-01-30 NOTE — Consult Note (Signed)
Neurology Consultation Reason for Consult: Double vision Referring Physician: Georges Mouse  CC: Double vision  History is obtained from: Patient  HPI: Christian Mccall is a 63 y.o. male with a history of hypertension, hyperlipidemia, TIA who presents with transient double vision and unsteady gait.  He has a history of "TIAs" which consist of left facial weakness that is transient in nature lasting 5 to 10 minutes.  When I ask him how many he has had, his wife answers "thousands" he does not typically go to the hospital for this.  She states that his left eye will close in his left face will get weak in his left arm and leg will also get "floppy."  He sometimes has some head discomfort associated with it, but not really before or after it.  Today, however, he had something that he had never had before.  While he was driving, he noticed that he was seeing 2 lines that were side-by-side.  He noticed that if he looked at street signs, they were also side-by-side.  This started at noon.  They met friends for lunch, and the double vision persisted.  He then began to have his left facial weakness and was very unsteady.  The symptoms all lasted until about 10:00 tonight  He went to [facility].  However due to his complicated medical problems and lack of availability of echo on the weekend at that hospital, he was transferred, and given that the patient is from [place] and typically comes to [facility] he requested transfer here.  LKW: Noon 8/21 tpa given?: no, resolution of symptoms.    ROS: A 14 point ROS was performed and is negative except as noted in the HPI.   Past Medical History:  Diagnosis Date  . AAA (abdominal aortic aneurysm) ([place])   . Anginal pain ([place])   . Bulging lumbar disc    "& some in my neck"  . C. difficile colitis    2008 OR 09  . CAD (coronary artery disease)   . Change in platelet count    pt states 'has been low since 2012; patient had to receive platelets  in surgery in Nov 2016'  . Chronic back pain   . Cirrhosis ([place])   . COPD (chronic obstructive pulmonary disease) ([place])   . Diabetic peripheral neuropathy ([place])   . Emphysema lung ([place])   . Enlarged liver   . Enlarged prostate   . GERD (gastroesophageal reflux disease)   . Hardening of the arteries of the brain   . Headache    "~ weekly" (04/12/2015)  . Heart attack [facility]) Nov. 15,2016   TIA  . History of colon polyps   . Hyperlipidemia   . Hypertension   . Leg pain    with walking and pain in feet while lying flat  . Memory loss    Due to hardening of the arteries in the brain  . Myocardial infarction ([place])    "I've had probably 6-7" (04/12/2015)  . Restless leg syndrome   . Rheumatic fever    AS A CHILD  ?6 YRS OLD  . RLS (restless legs syndrome)   . Shortness of breath 12/06/10   while lying flat and with exertion  . Snoring   . Spleen enlarged   . Stroke [facility]) 2004   denies residual on 04/12/2015  . Thrombocytopenia ([place])    since 2012; can be as low as 54K.   [name] TIA (transient ischemic attack) "several"   "  since 2015" (04/12/2015)  . Type II diabetes mellitus ([place]) dx'd 2009     Family History  Problem Relation Age of Onset  . Aneurysm Mother        AAA  . Heart disease Mother        Heart Disease before age 20  . Other Mother        Carotid artery stenosis  . Hyperlipidemia Mother   . Hypertension Mother   . Lupus Sister   . Cancer Sister        Ovarian  . Heart disease Sister   . Heart disease Brother        Heart Disease before age 104  . Hypertension Brother   . Heart attack Brother   . Heart disease Brother        Before age 57  . Heart attack Brother   . Heart attack Brother   . Hyperlipidemia Father   . Hypertension Father   . Aneurysm Maternal Aunt        AAA  . Other Maternal Aunt        AAA  . Aneurysm Maternal Uncle        AAA  . Other Maternal Uncle        AAA     Social History:  reports that he quit smoking about 17 years ago. His  smoking use included cigarettes. He smoked 0.00 packs per day for 30.00 years. He has never used smokeless tobacco. He reports current alcohol use of about 1.0 standard drinks of alcohol per week. He reports that he does not use drugs.   Exam: Current vital signs: BP (!) 141/77 (BP Location: Left Arm)   Pulse 60   Temp (!) 97.5 F (36.4 C) (Oral)   Resp 18   Ht '5\' 11"'$  (1.803 m)   Wt 94.1 kg   SpO2 95%   BMI 28.93 kg/m  Vital signs in last 24 hours: Temp:  [97.5 F (36.4 C)] 97.5 F (36.4 C) (08/22 0011) Pulse Rate:  [60] 60 (08/22 0011) Resp:  [18] 18 (08/22 0011) BP: (141)/(77) 141/77 (08/22 0011) SpO2:  [95 %] 95 % (08/22 0011) Weight:  [94.1 kg] 94.1 kg (08/22 0011)   Physical Exam  Constitutional: Appears well-developed and well-nourished.  Psych: Affect appropriate to situation Eyes: No scleral injection HENT: No OP obstrucion Head: Normocephalic.  Cardiovascular: Normal rate and regular rhythm.  Respiratory: Effort normal, non-labored breathing GI: Soft.  No distension. There is no tenderness.  Skin: WDI  Neuro: Mental Status: Patient is awake, alert, oriented to person, place, month, year, and situation. Patient is able to give a clear and coherent history. No signs of aphasia or neglect Cranial Nerves: II: Visual Fields are full. Pupils are equal, round, and reactive to light.   III,IV, VI: EOMI without ptosis or diploplia.  V: Facial sensation is symmetric to temperature VII: Smile is symmetric, he has slight the lower outer canthus on the left compared to the right. VIII: hearing is intact to voice X: Uvula elevates symmetrically XI: Shoulder shrug is symmetric. XII: tongue is midline without atrophy or fasciculations.  Motor: Tone is normal. Bulk is normal. 5/5 strength was present in all four extremities.  Sensory: Sensation is symmetric to light touch and temperature in the arms and legs. Cerebellar: FNF and HKS are intact bilaterally   I have  reviewed labs in epic and the results pertinent to this consultation are: Glucose 198  Impression: 63 year old male with transient diplopia  and unsteady gait.  The intermittent episodes of left-sided facial droop is been recurring for quite some time would be unusual for TIA, given the number of times it has happened.  However the new symptoms of diplopia with unsteady gait are quite concerning.  I would favor treating this as TIA given his extensive risk factors.  Recommendations: - HgbA1c, fasting lipid panel - MRI  of the brain without contrast - Frequent neuro checks - Echocardiogram -CTA head and neck - Prophylactic therapy-continue home aspirin and Plavix - Risk factor modification - Telemetry monitoring - PT consult, OT consult, Speech consult - Stroke team to follow    Roland Rack, MD [facility] [phone]  If 7pm- 7am, please page neurology on call as listed in [place].

## 2019-01-31 ENCOUNTER — Inpatient Hospital Stay (HOSPITAL_COMMUNITY): Payer: Medicare HMO

## 2019-01-31 DIAGNOSIS — G459 Transient cerebral ischemic attack, unspecified: Principal | ICD-10-CM

## 2019-01-31 LAB — GLUCOSE, CAPILLARY
Glucose-Capillary: 155 mg/dL — ABNORMAL HIGH (ref 70–99)
Glucose-Capillary: 384 mg/dL — ABNORMAL HIGH (ref 70–99)
Glucose-Capillary: 319 mg/dL — ABNORMAL HIGH (ref 70–99)
Glucose-Capillary: 247 mg/dL — ABNORMAL HIGH (ref 70–99)

## 2019-01-31 LAB — CBC
HCT: 43.3 % (ref 39.0–52.0)
Hemoglobin: 14.7 g/dL (ref 13.0–17.0)
MCH: 30.9 pg (ref 26.0–34.0)
MCHC: 33.9 g/dL (ref 30.0–36.0)
MCV: 91.2 fL (ref 80.0–100.0)
Platelets: 63 10*3/uL — ABNORMAL LOW (ref 150–400)
RBC: 4.75 MIL/uL (ref 4.22–5.81)
RDW: 13.1 % (ref 11.5–15.5)
WBC: 4.5 10*3/uL (ref 4.0–10.5)
nRBC: 0 % (ref 0.0–0.2)

## 2019-01-31 MED ORDER — TERAZOSIN HCL 1 MG PO CAPS
1.0000 mg | ORAL_CAPSULE | Freq: Every day | ORAL | Status: DC
  Administered 2019-01-31: 22:00:00 1 mg via ORAL
  Filled 2019-01-31 (×2): qty 1

## 2019-01-31 MED ORDER — CLONAZEPAM 0.5 MG PO TABS: 1.0000 mg | ORAL_TABLET | Freq: Two times a day (BID) | ORAL | Status: DC | PRN

## 2019-01-31 NOTE — Progress Notes (Signed)
PROGRESS NOTE    Christian Mccall  [id number] DOB: [birth year] DOA: 01/30/2019 PCP: Shon Baton, MD    Brief Narrative:  63 year old gentleman with history of GERD, COPD, chronic hypoxic failure on 2 L oxygen, hypertension, hyperlipidemia, type 2 diabetes with neuropathy, CAD status post CABG, recent cardiac catheterization, AAA status post endovascular repair complicated by leak, history of TIA stroke and multiple episodes of left hemiparesis self controlled admitted to the hospital with acute onset of diplopia.  Symptoms resolving by the time of presentation.  Was not a candidate for TPA because of resolution of symptoms on presentation.   Assessment & Plan:   Principal Problem:   Diplopia Active Problems:   Essential hypertension, benign   CAD (coronary artery disease) of artery bypass graft   Left facial numbness   Diabetes (HCC)   TIA (transient ischemic attack)  Acute onset diplopia and unsteady gait: With history of intermittent episodes of left-sided facial droop, frequent self-limiting events at home.  Presumed TIA. Clinical findings, no neurological deficits now. CT head findings, normal on presentation. MRI of the brain, 5 mm focus of increased diffusion of the posterior right frontal region suspected artifact.  Neurology do not think it is an infarction. Carotid Doppler, normal.  EEG was normal. 2D echocardiogram, normal Antiplatelet therapy, chronic thrombocytopenia so patient is on aspirin. LDL 25, already on Vytorin and fenofibrate. Hemoglobin A1c, 8.8 on insulin, counseled about compliance. DVT prophylaxis, SCDs Therapy recommendations, no further recommendations Neurology recommended treatment as complicated migraine, started on verapamil.  Will need outpatient follow-up.  Essential hypertension: Blood pressure is normal, low normal.  Started on verapamil.  Will discontinue terazosin. Monitor in the hospital overnight.  Type 2 diabetes on insulin: A1c 8.8.  On  oral hypoglycemics at home.  Currently on insulin.  He will resume Jardiance, glimepiride and metformin that he takes at home.    Patient was mobilized by therapies.  He had episode of dizziness with no orthostatic changes.  Due to significant findings, he will need monitoring in the hospital before discharge.  Will monitor his blood pressures and titrate medications as needed.  DVT prophylaxis: SCDs Code Status: Full code Family Communication: Wife at bedside Disposition Plan: Home.  Anticipate tomorrow.   Consultants:   Neurology  Procedures:   EEG, normal.  Antimicrobials:   None   Subjective: Patient seen and examined.  No overnight events.  He has no more diplopia.  Later today, on ambulation he complained of feeling dizzy.  Objective: Vitals:   01/31/19 0748 01/31/19 0957 01/31/19 1127 01/31/19 1222  BP: 102/64 111/74  (!) 108/55  Pulse: 62 75  63  Resp: 16   20  Temp: 98.7 F (37.1 C)   97.9 F (36.6 C)  TempSrc: Oral   Oral  SpO2: 96% 96% 94% 94%  Weight:      Height:        Intake/Output Summary (Last 24 hours) at 01/31/2019 1420 Last data filed at 01/30/2019 1600 Gross per 24 hour  Intake 0 ml  Output --  Net 0 ml   Filed Weights   01/30/19 0011  Weight: 94.1 kg    Examination:  General exam: Appears calm and comfortable  Respiratory system: Clear to auscultation. Respiratory effort normal. Cardiovascular system: S1 & S2 heard, RRR. No JVD, murmurs, rubs, gallops or clicks. No pedal edema. Gastrointestinal system: Abdomen is nondistended, soft and nontender. No organomegaly or masses felt. Normal bowel sounds heard. Central nervous system: Alert and oriented. No focal  neurological deficits. Extremities: Symmetric 5 x 5 power. Skin: No rashes, lesions or ulcers Psychiatry: Judgement and insight appear normal. Mood & affect appropriate.     Data Reviewed: I have personally reviewed following labs and imaging studies  CBC: Recent Labs  Lab  01/30/19 0415 01/31/19 1032  WBC 4.2 4.5  HGB 14.2 14.7  HCT 41.6 43.3  MCV 91.4 91.2  PLT 50* 63*   Basic Metabolic Panel: Recent Labs  Lab 01/30/19 0415  NA 136  K 3.5  CL 102  CO2 24  GLUCOSE 166*  BUN 10  CREATININE 0.73  CALCIUM 8.6*   GFR: Estimated Creatinine Clearance: 112.1 mL/min (by C-G formula based on SCr of 0.73 mg/dL). Liver Function Tests: No results for input(s): AST, ALT, ALKPHOS, BILITOT, PROT, ALBUMIN in the last 168 hours. No results for input(s): LIPASE, AMYLASE in the last 168 hours. No results for input(s): AMMONIA in the last 168 hours. Coagulation Profile: Recent Labs  Lab 01/30/19 0913  INR 1.2   Cardiac Enzymes: No results for input(s): CKTOTAL, CKMB, CKMBINDEX, TROPONINI in the last 168 hours. BNP (last 3 results) No results for input(s): PROBNP in the last 8760 hours. HbA1C: Recent Labs    01/30/19 0412  HGBA1C 8.8*   CBG: Recent Labs  Lab 01/30/19 1112 01/30/19 1612 01/30/19 2235 01/31/19 0649 01/31/19 1218  GLUCAP 224* 213* 230* 155* 384*   Lipid Profile: Recent Labs    01/30/19 0412  CHOL 103  HDL 25*  LDLCALC 25  TRIG 267*  CHOLHDL 4.1   Thyroid Function Tests: No results for input(s): TSH, T4TOTAL, FREET4, T3FREE, THYROIDAB in the last 72 hours. Anemia Panel: No results for input(s): VITAMINB12, FOLATE, FERRITIN, TIBC, IRON, RETICCTPCT in the last 72 hours. Sepsis Labs: No results for input(s): PROCALCITON, LATICACIDVEN in the last 168 hours.  No results found for this or any previous visit (from the past 240 hour(s)).       Radiology Studies: Ct Angio Head W Or Wo Contrast  Result Date: 01/30/2019 CLINICAL DATA:  Stroke, follow up. Diplopia. Left facial numbness. TIA. EXAM: CT ANGIOGRAPHY HEAD AND NECK TECHNIQUE: Multidetector CT imaging of the head and neck was performed using the standard protocol during bolus administration of intravenous contrast. Multiplanar CT image reconstructions and MIPs were  obtained to evaluate the vascular anatomy. Carotid stenosis measurements (when applicable) are obtained utilizing NASCET criteria, using the distal internal carotid diameter as the denominator. CONTRAST:  75mL OMNIPAQUE IOHEXOL 350 MG/ML SOLN COMPARISON:  MRI brain 01/30/2019 FINDINGS: CT HEAD FINDINGS Brain: Mild atrophy and moderate diffuse white matter disease is again noted. Remote lacunar infarcts are present in the right caudate head. No acute hemorrhage or mass lesion is present. The ventricles are of normal size. No significant extraaxial fluid collection is present. The brainstem and cerebellum are within normal limits. Vascular: Atherosclerotic changes are present within the cavernous internal carotid arteries bilaterally. There is no hyperdense vessel. Skull: Calvarium is intact. No focal lytic or blastic lesions are present. No significant extracranial soft tissue injury is present. Sinuses: The paranasal sinuses and mastoid air cells are clear. Orbits: The globes and orbits are within normal limits. Review of the MIP images confirms the above findings CTA NECK FINDINGS Aortic arch: A 3 vessel arch configuration is present. Atherosclerotic changes are present without significant stenosis at the great vessel origins or aneurysm. Right carotid system: The right common carotid artery demonstrates dense atherosclerotic calcifications in the anterior wall without a significant stenosis relative to the  more distal vessel. The right carotid bifurcation demonstrate significant noncalcified plaque posteriorly. The lumen is narrowed to 1.8 mm. This compares with a more distal measurement of 3.8 mm. Additional stenoses are present. Cervical left ICA is otherwise normal. Left carotid system: The left common carotid artery demonstrates mild mural calcification without significant stenosis. Dense calcifications are present at the carotid bifurcation without a significant stenosis relative to the more distal vessel.  The cervical left ICA is otherwise normal. Vertebral arteries: The vertebral arteries originate from the subclavian arteries bilaterally. There is calcification at the origin of both vertebral arteries. A 50% stenosis is present on the right. No significant stenosis is present on the left. The left vertebral artery is slightly dominant to the right. No tandem stenosis is present in either vertebral artery in the neck. Skeleton: Mild endplate degenerative changes are present. There is straightening and slight reversal of the normal cervical lordosis. Degenerative anterolisthesis is present at C2-3. The patient is edentulous. No focal lytic or blastic lesions are present. Other neck: The soft tissues of the neck are otherwise unremarkable. There is mild heterogeneity of the thyroid without a dominant lesion. No significant adenopathy is present. Salivary glands are within normal limits. No focal mucosal or submucosal lesions are present. Upper chest: The lung apices are clear. No focal nodule, mass, or airspace disease is present. Review of the MIP images confirms the above findings CTA HEAD FINDINGS Anterior circulation: Atherosclerotic changes are present within the cavernous right ICA without a significant stenosis relative to the more distal vessel. Calcifications are also present in the cavernous left internal carotid artery. There is a 50% stenosis relative to the more distal vessel. ICA termini are within the normal limits bilaterally. The A1 and M1 segments are normal. The anterior communicating artery is patent. MCA bifurcations are intact. There is some attenuation of distal ACA and MCA branch vessels without a significant proximal stenosis or occlusion. Posterior circulation: The left vertebral artery is the dominant vessel. PICA origins are visualized and normal. The vertebrobasilar junction is normal. Basilar artery is normal. Both posterior cerebral arteries originate from basilar tip. There is some  attenuation of distal PCA branch vessels without a significant proximal stenosis or occlusion. Venous sinuses: The dural sinuses are patent. Straight sinus deep cerebral veins are intact. Cortical veins are unremarkable. Anatomic variants: None Review of the MIP images confirms the above findings IMPRESSION: 1. Approximately 50% stenosis of the proximal right ICA secondary to prominent noncalcified plaque. 2. Atherosclerotic changes at the aortic arch and within the common carotid arteries bilaterally without significant stenosis. 3. Atherosclerotic calcifications within the cavernous ICA bilaterally. A 50% stenosis is present on the left. 4. Mild distal disease without a significant proximal stenosis, aneurysm, or branch vessel occlusion within the circle-of-Willis. 5. 50% stenosis at the origin of the right vertebral artery. 6. Mild degenerative changes of the cervical spine. Electronically Signed   By: San Morelle M.D.   On: 01/30/2019 09:01   Ct Angio Neck W Or Wo Contrast  Result Date: 01/30/2019 CLINICAL DATA:  Stroke, follow up. Diplopia. Left facial numbness. TIA. EXAM: CT ANGIOGRAPHY HEAD AND NECK TECHNIQUE: Multidetector CT imaging of the head and neck was performed using the standard protocol during bolus administration of intravenous contrast. Multiplanar CT image reconstructions and MIPs were obtained to evaluate the vascular anatomy. Carotid stenosis measurements (when applicable) are obtained utilizing NASCET criteria, using the distal internal carotid diameter as the denominator. CONTRAST:  49mL OMNIPAQUE IOHEXOL 350 MG/ML SOLN COMPARISON:  MRI brain 01/30/2019 FINDINGS: CT HEAD FINDINGS Brain: Mild atrophy and moderate diffuse white matter disease is again noted. Remote lacunar infarcts are present in the right caudate head. No acute hemorrhage or mass lesion is present. The ventricles are of normal size. No significant extraaxial fluid collection is present. The brainstem and  cerebellum are within normal limits. Vascular: Atherosclerotic changes are present within the cavernous internal carotid arteries bilaterally. There is no hyperdense vessel. Skull: Calvarium is intact. No focal lytic or blastic lesions are present. No significant extracranial soft tissue injury is present. Sinuses: The paranasal sinuses and mastoid air cells are clear. Orbits: The globes and orbits are within normal limits. Review of the MIP images confirms the above findings CTA NECK FINDINGS Aortic arch: A 3 vessel arch configuration is present. Atherosclerotic changes are present without significant stenosis at the great vessel origins or aneurysm. Right carotid system: The right common carotid artery demonstrates dense atherosclerotic calcifications in the anterior wall without a significant stenosis relative to the more distal vessel. The right carotid bifurcation demonstrate significant noncalcified plaque posteriorly. The lumen is narrowed to 1.8 mm. This compares with a more distal measurement of 3.8 mm. Additional stenoses are present. Cervical left ICA is otherwise normal. Left carotid system: The left common carotid artery demonstrates mild mural calcification without significant stenosis. Dense calcifications are present at the carotid bifurcation without a significant stenosis relative to the more distal vessel. The cervical left ICA is otherwise normal. Vertebral arteries: The vertebral arteries originate from the subclavian arteries bilaterally. There is calcification at the origin of both vertebral arteries. A 50% stenosis is present on the right. No significant stenosis is present on the left. The left vertebral artery is slightly dominant to the right. No tandem stenosis is present in either vertebral artery in the neck. Skeleton: Mild endplate degenerative changes are present. There is straightening and slight reversal of the normal cervical lordosis. Degenerative anterolisthesis is present at  C2-3. The patient is edentulous. No focal lytic or blastic lesions are present. Other neck: The soft tissues of the neck are otherwise unremarkable. There is mild heterogeneity of the thyroid without a dominant lesion. No significant adenopathy is present. Salivary glands are within normal limits. No focal mucosal or submucosal lesions are present. Upper chest: The lung apices are clear. No focal nodule, mass, or airspace disease is present. Review of the MIP images confirms the above findings CTA HEAD FINDINGS Anterior circulation: Atherosclerotic changes are present within the cavernous right ICA without a significant stenosis relative to the more distal vessel. Calcifications are also present in the cavernous left internal carotid artery. There is a 50% stenosis relative to the more distal vessel. ICA termini are within the normal limits bilaterally. The A1 and M1 segments are normal. The anterior communicating artery is patent. MCA bifurcations are intact. There is some attenuation of distal ACA and MCA branch vessels without a significant proximal stenosis or occlusion. Posterior circulation: The left vertebral artery is the dominant vessel. PICA origins are visualized and normal. The vertebrobasilar junction is normal. Basilar artery is normal. Both posterior cerebral arteries originate from basilar tip. There is some attenuation of distal PCA branch vessels without a significant proximal stenosis or occlusion. Venous sinuses: The dural sinuses are patent. Straight sinus deep cerebral veins are intact. Cortical veins are unremarkable. Anatomic variants: None Review of the MIP images confirms the above findings IMPRESSION: 1. Approximately 50% stenosis of the proximal right ICA secondary to prominent noncalcified plaque. 2. Atherosclerotic changes  at the aortic arch and within the common carotid arteries bilaterally without significant stenosis. 3. Atherosclerotic calcifications within the cavernous ICA  bilaterally. A 50% stenosis is present on the left. 4. Mild distal disease without a significant proximal stenosis, aneurysm, or branch vessel occlusion within the circle-of-Willis. 5. 50% stenosis at the origin of the right vertebral artery. 6. Mild degenerative changes of the cervical spine. Electronically Signed   By: San Morelle M.D.   On: 01/30/2019 09:01   Mr Brain Wo Contrast  Result Date: 01/30/2019 CLINICAL DATA:  Initial evaluation for acute diplopia, slight left-sided facial numbness and droop. Symptoms now resolved. EXAM: MRI HEAD WITHOUT CONTRAST TECHNIQUE: Multiplanar, multiecho pulse sequences of the brain and surrounding structures were obtained without intravenous contrast. COMPARISON:  Prior brain MRI from 12/07/2013. FINDINGS: Brain: Cerebral volume within normal limits for age. Patchy and confluent T2/FLAIR hyperintensity within the periventricular and deep white matter both cerebral hemispheres most consistent with chronic microvascular ischemic disease, moderate nature, progressed from previous. Few small remote lacunar infarcts noted at the right caudate head. Single punctate 5 mm focus of minimally increased diffusion signal within the subcortical white matter of the posterior right frontal region (series 5, image 80). No discernible corresponding ADC correlate. While this finding could reflect artifact/T2 shine through, sequelae of late subacute small vessel ischemia would be difficult to exclude. No other diffusion abnormality to suggest acute or early subacute ischemia to explain patient's symptoms. Gray-white matter differentiation maintained. No encephalomalacia to suggest chronic cortical infarction. No foci of susceptibility artifact to suggest acute or chronic intracranial hemorrhage. No mass lesion, midline shift or mass effect. No hydrocephalus. No extra-axial fluid collection. Pituitary gland suprasellar region normal. Midline structures intact. Vascular: Major  intracranial vascular flow voids are maintained. Skull and upper cervical spine: Craniocervical junction within normal limits. Upper cervical spine normal. Bone marrow signal intensity within normal limits. Scalp soft tissues within normal limits. Sinuses/Orbits: Globes and orbital soft tissues within normal limits. Paranasal sinuses are clear. Small left mastoid effusion, of doubtful significance. Inner ear structures grossly normal. Other: None. IMPRESSION: 1. No acute intracranial abnormality identified. 2. 5 mm focus of mildly increased diffusion signal within the subcortical white matter of the posterior right frontal region, which could reflect a small focus of artifact/T2 shine through or possibly late subacute ischemia. Finding is not felt to be acute in nature. 3. Underlying moderate chronic microvascular ischemic disease with multiple remote lacunar infarcts involving the right caudate, progressed relative to 2015. Electronically Signed   By: Jeannine Boga M.D.   On: 01/30/2019 03:28   Mr Abdomen W Wo Contrast  Result Date: 01/30/2019 CLINICAL DATA:  Liver lesions seen on previous ultrasound exam. EXAM: MRI ABDOMEN WITHOUT AND WITH CONTRAST TECHNIQUE: Multiplanar multisequence MR imaging of the abdomen was performed both before and after the administration of intravenous contrast. CONTRAST:  9 cc Gadavist COMPARISON:  Ultrasound exam 10/13/2018 FINDINGS: Lower chest: Unremarkable. Hepatobiliary: Liver measures 22.8 cm craniocaudal length. Nodular liver contour consistent with cirrhosis. Tiny signal void in the subcapsular dome of liver is compatible with granuloma seen on previous CT. Arterial phase postcontrast imaging shows a subtle 10 mm focus of hyperenhancement in the inferior right liver (segment V on 73/13). This lesion shows no differential washout on later post-contrast imaging and has no enhancing rim. There is no evidence for gallstones, gallbladder wall thickening, or pericholecystic  fluid. No intrahepatic or extrahepatic biliary dilation. Pancreas: No focal mass lesion. No dilatation of the main duct. No intraparenchymal  cyst. No peripancreatic edema. Spleen:  Spleen measures 19.2 cm craniocaudal length, enlarged. Adrenals/Urinary Tract: No adrenal nodule or mass. Kidneys unremarkable. Stomach/Bowel: Stomach is unremarkable. No gastric wall thickening. No evidence of outlet obstruction. Duodenum is normally positioned as is the ligament of Treitz. No small bowel or colonic dilatation within the visualized abdomen. Vascular/Lymphatic: Abdominal aortic aneurysm noted, status post endograft. Portal vein, superior mesenteric vein, and splenic vein are patent. There is no gastrohepatic or hepatoduodenal ligament lymphadenopathy. No intraperitoneal or retroperitoneal lymphadenopathy. Other:  No intraperitoneal free fluid. Musculoskeletal: No abnormal marrow enhancement within the visualized bony anatomy. IMPRESSION: 1. Cirrhotic liver morphology. 2. 10 mm enhancing lesion identified inferior right liver without differential washout or rim enhancement. This is compatible with LI-RADS 3 category and follow-up MRI in 3-6 months recommended to re-evaluate. 3. Splenomegaly consistent with portal venous hypertension. Electronically Signed   By: Misty Stanley M.D.   On: 01/30/2019 20:43        Scheduled Meds:   stroke: mapping our early stages of recovery book   Does not apply Once   aspirin EC  81 mg Oral Daily   canagliflozin  100 mg Oral QAC breakfast   cholecalciferol  2,000 Units Oral BID   clonazePAM  1 mg Oral BID   escitalopram  20 mg Oral QHS   ezetimibe-simvastatin  1 tablet Oral QHS   fenofibrate  160 mg Oral Daily   furosemide  40 mg Oral QODAY   glimepiride  4 mg Oral QPM   insulin aspart  0-5 Units Subcutaneous QHS   insulin aspart  0-9 Units Subcutaneous TID WC   insulin glargine  17 Units Subcutaneous QHS   losartan  50 mg Oral Daily   metoprolol tartrate   50 mg Oral BID   mometasone-formoterol  2 puff Inhalation BID   nitroGLYCERIN  0.4 mg Transdermal Daily   pantoprazole  20 mg Oral Daily   pramipexole  0.25 mg Oral QHS   pregabalin  100 mg Oral Daily   pregabalin  200 mg Oral QHS   ranolazine  1,000 mg Oral BID   terazosin  1 mg Oral QHS   umeclidinium bromide  1 puff Inhalation Daily   verapamil  120 mg Oral Daily   Continuous Infusions:   LOS: 1 day    Time spent: 25 minutes    Barb Merino, MD [facility] Pager [phone]  If 7PM-7AM, please contact night-coverage [email or web address] Password [email or web address] 01/31/2019, 2:20 PM

## 2019-01-31 NOTE — Progress Notes (Signed)
EEG completed, results pending. 

## 2019-01-31 NOTE — Procedures (Signed)
Patient Name: Christian Mccall  MRN: [id number]  Epilepsy Attending: Lora Havens  Referring Physician/Provider: Dr Rosalin Hawking Date: 01/31/2019  Duration: 24.32 mins  Patient history: 63 yo M with transient episodes of left face, arm and leg weakness. EEG to evaluate for seizure.  Level of alertness: awake, asleep  AEDs during EEG study: clonazepam, pregabalin  Technical aspects: This EEG study was done with scalp electrodes positioned according to the 10-20 International system of electrode placement. Electrical activity was acquired at a sampling rate of 500Hz  and reviewed with a high frequency filter of 70Hz  and a low frequency filter of 1Hz . EEG data were recorded continuously and digitally stored.   DESCRIPTION:  The posterior dominant rhythm consists of 8-9 Hz activity of moderate voltage (25-35 uV) seen predominantly in posterior head regions, symmetric and reactive to eye opening and eye closing. Sleep was characterized by sleep spindles, vertex waves. Hyperventilation and photic stimulation were not performed.  IMPRESSION: This study is within normal limits. No seizures or epileptiform discharges were seen throughout the recording.  Ogden Handlin Barbra Sarks

## 2019-01-31 NOTE — Progress Notes (Signed)
STROKE TEAM PROGRESS NOTE   INTERVAL HISTORY Pt wife at bedside.  Patient today drowsy sleepy, hardly to participate with PT.  Also slept during EEG recording.  As per wife, patient had this drowsy sleepy episode 2-3 times per week, sometimes sleeps for several hours and sometimes sleeps for a day.  This not new event.  This sleepy episode are separate from those episode of left face arm leg weakness.  OBJECTIVE Vitals:   01/31/19 0957 01/31/19 1127 01/31/19 1222 01/31/19 1620  BP: 111/74  (!) 108/55 117/61  Pulse: 75  63 (!) 56  Resp:   20 20  Temp:   97.9 F (36.6 C) 98.2 F (36.8 C)  TempSrc:   Oral Oral  SpO2: 96% 94% 94% 94%  Weight:      Height:        CBC:  Recent Labs  Lab 01/30/19 0415 01/31/19 1032  WBC 4.2 4.5  HGB 14.2 14.7  HCT 41.6 43.3  MCV 91.4 91.2  PLT 50* 63*    Basic Metabolic Panel:  Recent Labs  Lab 01/30/19 0415  NA 136  K 3.5  CL 102  CO2 24  GLUCOSE 166*  BUN 10  CREATININE 0.73  CALCIUM 8.6*    Lipid Panel:     Component Value Date/Time   CHOL 103 01/30/2019 0412   TRIG 267 (H) 01/30/2019 0412   HDL 25 (L) 01/30/2019 0412   CHOLHDL 4.1 01/30/2019 0412   VLDL 53 (H) 01/30/2019 0412   LDLCALC 25 01/30/2019 0412   HgbA1c:  Lab Results  Component Value Date   HGBA1C 8.8 (H) 01/30/2019   Urine Drug Screen:     Component Value Date/Time   LABOPIA NONE DETECTED 01/30/2019 1240   COCAINSCRNUR NONE DETECTED 01/30/2019 1240   LABBENZ NONE DETECTED 01/30/2019 1240   AMPHETMU NONE DETECTED 01/30/2019 1240   THCU NONE DETECTED 01/30/2019 1240   LABBARB NONE DETECTED 01/30/2019 1240    Alcohol Level No results found for: ETH  IMAGING  Ct Angio Head W Or Wo Contrast Ct Angio Neck W Or Wo Contrast 01/30/2019 IMPRESSION:  1. Approximately 50% stenosis of the proximal right ICA secondary to prominent noncalcified plaque.  2. Atherosclerotic changes at the aortic arch and within the common carotid arteries bilaterally without  significant stenosis.  3. Atherosclerotic calcifications within the cavernous ICA bilaterally. A 50% stenosis is present on the left.  4. Mild distal disease without a significant proximal stenosis, aneurysm, or branch vessel occlusion within the circle-of-Willis.  5. 50% stenosis at the origin of the right vertebral artery.  6. Mild degenerative changes of the cervical spine.    Mr Brain Wo Contrast 01/30/2019 IMPRESSION:  1. No acute intracranial abnormality identified.  2. 5 mm focus of mildly increased diffusion signal within the subcortical white matter of the posterior right frontal region, which could reflect a small focus of artifact/T2 shine through or possibly late subacute ischemia. Finding is not felt to be acute in nature.  By my read, the described focus likely to be T2 shine through instead of subacute stroke. 3. Underlying moderate chronic microvascular ischemic disease with multiple remote lacunar infarcts involving the right caudate, progressed relative to 2015.    Transthoracic Echocardiogram  01/31/2019 IMPRESSIONS  1. The left ventricle has normal systolic function with an ejection fraction of 60-65%. The cavity size was normal. There is mildly increased left ventricular wall thickness. Left ventricular diastolic Doppler parameters are consistent with pseudonormalization.  2. The right ventricle  has normal systolic function. The cavity was normal. There is no increase in right ventricular wall thickness.  3. No evidence of mitral valve stenosis.  4. The tricuspid valve is grossly normal.  5. The aortic valve is grossly normal. No stenosis of the aortic valve.  6. The aorta is normal unless otherwise noted.  7. The aortic root and ascending aorta are normal in size and structure.  8. The atrial septum is grossly normal.  EEG  IMPRESSION: This study is within normal limits. No seizures or epileptiform discharges were seen throughout the recording.   PHYSICAL  EXAM  Temp:  [97.6 F (36.4 C)-98.7 F (37.1 C)] 98.2 F (36.8 C) (08/23 1620) Pulse Rate:  [50-75] 56 (08/23 1620) Resp:  [16-20] 20 (08/23 1620) BP: (97-127)/(48-74) 117/61 (08/23 1620) SpO2:  [93 %-98 %] 94 % (08/23 1620)  General - Well nourished, well developed, in no apparent distress, sleepy but arousable.  Ophthalmologic - fundi not visualized due to noncooperation.  Cardiovascular - Regular rate and rhythm.  Mental Status -  Sleepy but arousable and orientation to time, place, and person were intact. Language including expression, naming, repetition, comprehension was assessed and found intact.  Cranial Nerves II - XII - II - Visual field intact OU. III, IV, VI - Extraocular movements intact. V - Facial sensation intact bilaterally. VII - Facial movement intact bilaterally. VIII - Hearing & vestibular intact bilaterally. X - Palate elevates symmetrically. XI - Chin turning & shoulder shrug intact bilaterally. XII - Tongue protrusion intact.  Motor Strength - The patient's strength was normal in all extremities and pronator drift was absent.  Bulk was normal and fasciculations were absent.   Motor Tone - Muscle tone was assessed at the neck and appendages and was normal.  Reflexes - The patient's reflexes were symmetrical in all extremities and he had no pathological reflexes.  Sensory - Light touch, temperature/pinprick were assessed and were symmetrical.    Coordination - The patient had normal movements in the hands with no ataxia or dysmetria.  Tremor was absent.  Gait and Station - deferred.   ASSESSMENT/PLAN Christian Mccall is a 63 y.o. male with history of hypertension, hyperlipidemia, cirrhosis, AAA, COPD, hx of thrombocytopenia, DM, CAD (MI / stents / CABG), hyperlipidemia, and multiple TIAs  presenting with transient double vision and unsteady gait.[name] He did not receive IV t-PA due to resolution of deficits.   Recurrent episode with left hemiparesis  likely hemiplegic migraine, however, patient does have significant stroke risk factors.  Resultant episode resolved  MRI head - Underlying moderate chronic microvascular ischemic disease with multiple remote lacunar infarcts involving the right caudate head, progressed relative to 2015.   CTA H&N - 50% stenosis bilateral ICAs and 50% stenosis origin of right VA.  2D Echo - EF 60 - 65%. No cardiac source of emboli identified.   EEG - normal  - to rule out seizure  Novel Coronavirus - not detected   LDL - 25  HgbA1c - 8.8  UDS negative  VTE prophylaxis - SCDs  aspirin 81 mg daily and clopidogrel 75 mg daily prior to admission, now on aspirin 81 mg daily.  Plavix discontinued for now due to thrombocytopenia with platelet 50.  Once platelet more than 75, Plavix can be resumed.  Patient counseled to be compliant with his antithrombotic medications  Ongoing aggressive stroke risk factor management  Therapy recommendations:  HH PT  Disposition:  Pending  ??  Hemiplegic migraine  Recurrent frequent  episode of left proptosis, left facial numbness weakness, left arm and leg weakness, occurring in sequence, lasting 10 to 15 minutes with mild headache for the last 18 years.  Stereotypical presentation  Mom have similar episodes  MRI no significant infarcts  EEG pending to rule out seizure  Initiated trial of verapamil.  If not effective, Diamox or Lamictal can be considered  BP monitoring  Episodes of sleepiness  As per wife, patient has episode of sleepiness 2-3 times per week  Episode lasting hours to a day  Separate from the hemiparesis episodes  Unclear etiology  Follow-up with Dr. Leonie Man as outpt at [facility]  OK to hold off discharge toay  CAD/MI s/p stenting and CABG  Following with cardiology  CABG in 2002  Diffuse heavy native vessel calcification consistent with Monckeberg's sclerosis.  On metoprolol, ARB, Lasix  Thrombocytopenia  Following with  oncologist Dr. Alen Blew  Platelet 70->100->50->63  Hold off Plavix for now.  Once platelet more than 75, may resume Plavix  Continue close follow-up with Dr. Alen Blew  Hypertension  Home BP meds: Cozaar 50 mg QD, Hytrin 1 mg QD, metoprolol 50 mg Bid  Current BP meds: Cozaar 50 mg QD, Hytrin 1 mg QD, metoprolol 50 mg Bid  Stable  On verapamil CR 120  Close BP monitoring . Long-term BP goal normotensive  Hyperlipidemia  Home Lipid lowering medication:  Vytorin (Zetia / Zocor) 10/40 daily and fenofibrate 160 mg daily  LDL 25, goal < 70  Current lipid lowering medication:  Vytorin (Zetia / Zocor) 10/40 daily and fenofibrate 160 mg daily  Continue home meds at discharge  Diabetes  HgbA1c 8.8, goal < 7.0  Uncontrolled  SSI  CBG monitoring  On Lantus  Close PCP follow-up  Other Stroke Risk Factors  Advanced age  Former cigarette smoker - quit  ETOH use, advised to drink no more than 1 alcoholic beverage per day.  AAA with endovascular repair and known [id number]  Other Active Problems  Thrombocytopenia hx - 50 ->63  Severe COPD with use of nocturnal oxygen  Mild hypotension - 108/55  Hospital day # 1  Rosalin Hawking, MD PhD Stroke Neurology 01/31/2019 7:45 PM   To contact Stroke Continuity provider, please refer to [email or web address] After hours, contact General Neurology

## 2019-01-31 NOTE — Evaluation (Addendum)
Occupational Therapy Evaluation Patient Details Name: Christian Mccall MRN: [id number] DOB: 1956/02/04 Today's Date: 01/31/2019    History of Present Illness Christian Mccall is a 63 y.o. male with a history of DM, hypertension, hyperlipidemia, CAD, COPD, emphysema, cirrhosis, thrombocytopenia, AAA, TIA who presents with transient double vision and unsteady gait. MRI showed multiple subacute infarcts but no acute intracranial abnormality    Clinical Impression   Patient evaluated by Occupational Therapy with no further acute OT needs identified. All education has been completed and the patient has no further questions. Pt very lethargic this date, but will arouse to voice.  He moves well, cognition appears intact. Lt UE grossly 4-/5 and ptosis present.  Pt fell asleep mid sentence in supine, sitting, and in standing  With pt requiring mod A to recover as he lost his balance when he fell asleep standing.  He and wife reports he has been having similar episodes for "18 years".  Once episode passes he is back to normal.   Wife provides direct supervision to pt on a daily basis due to these episodes.  Pt See below for any follow-up Occupational Therapy or equipment needs. OT is signing off. Thank you for this referral.      Follow Up Recommendations  No OT follow up;Supervision/Assistance - 24 hour    Equipment Recommendations  None recommended by OT    Recommendations for Other Services       Precautions / Restrictions Precautions Precautions: Fall Restrictions Weight Bearing Restrictions: No      Mobility Bed Mobility Overal bed mobility: Modified Independent                Transfers Overall transfer level: Needs assistance Equipment used: None Transfers: Sit to/from Stand Sit to Stand: Min guard         General transfer comment: min guard assist for safety     Balance Overall balance assessment: Needs assistance Sitting-balance support: No upper extremity  supported Sitting balance-Leahy Scale: Fair Sitting balance - Comments: able to maintain static sitting with min guard assist    Standing balance support: No upper extremity supported Standing balance-Leahy Scale: Poor Standing balance comment: Pt fell asleep while standing causing him to loose his balance and requiring mod A to recover                            ADL either performed or assessed with clinical judgement   ADL Overall ADL's : Needs assistance/impaired Eating/Feeding: Independent   Grooming: Wash/dry hands;Wash/dry face;Oral care;Brushing hair;Set up;Sitting   Upper Body Bathing: Set up;Sitting   Lower Body Bathing: Supervison/ safety;Sit to/from stand   Upper Body Dressing : Set up;Sitting   Lower Body Dressing: Moderate assistance;Sit to/from stand   Toilet Transfer: Moderate assistance;Stand-pivot   Toileting- Clothing Manipulation and Hygiene: Moderate assistance;Sit to/from stand       Functional mobility during ADLs: Modified independent;Moderate assistance General ADL Comments: Pt required assist due to standing balance as he fell asleep while standing causing him to loose his balance      Vision Baseline Vision/History: Wears glasses Wears Glasses: At all times Patient Visual Report: No change from baseline Vision Assessment?: Yes Eye Alignment: Within Functional Limits Ocular Range of Motion: Within Functional Limits Alignment/Gaze Preference: Within Defined Limits Tracking/Visual Pursuits: Able to track stimulus in all quads without difficulty Visual Fields: No apparent deficits Additional Comments: Pt with ptosis noted, but vision appears [facility]  Perception Perception Perception Tested?: Yes   Praxis Praxis Praxis tested?: Within functional limits    Pertinent Vitals/Pain       Hand Dominance Right   Extremity/Trunk Assessment Upper Extremity Assessment Upper Extremity Assessment: LUE deficits/detail LUE Deficits /  Details: Grossly 4-/5    Lower Extremity Assessment Lower Extremity Assessment: Defer to PT evaluation   Cervical / Trunk Assessment Cervical / Trunk Assessment: Normal   Communication Communication Communication: No difficulties   Cognition Arousal/Alertness: Awake/alert;Lethargic(alert before episode, lethargic after) Behavior During Therapy: WFL for tasks assessed/performed Overall Cognitive Status: Within Functional Limits for tasks assessed Area of Impairment: Problem solving;Following commands                               General Comments: Cognition appears WFL when he is awake.  Pt fell asleep mid sentence, while sitting EOB, and while standing    General Comments  BP 110/62; HR 57.  RN and MD made aware of pt performance     Exercises     Shoulder Instructions      Home Living Family/patient expects to be discharged to:: Private residence Living Arrangements: Spouse/significant other Available Help at Discharge: Family;Available 24 hours/day Type of Home: House Home Access: Stairs to enter [employer] of Steps: 2(5 for camper) Entrance Stairs-Rails: Right Home Layout: One level     Bathroom Shower/Tub: Occupational psychologist: Standard         Additional Comments: pt and wife spend a lot of time in their RV  Lives With: Spouse    Prior Functioning/Environment Level of Independence: Independent        Comments: Pt and wife report he has good and bad days.  On his bad days, he is quite lethargic sleeping much of the time         OT Problem List: Decreased activity tolerance;Impaired balance (sitting and/or standing)      OT Treatment/Interventions:      OT Goals(Current goals can be found in the care plan section) Acute Rehab OT Goals Patient Stated Goal: To figure out what is wrong with pt  OT Goal Formulation: All assessment and education complete, DC therapy  OT Frequency:     Barriers to D/C:             Co-evaluation              AM-PAC OT "6 Clicks" Daily Activity     Outcome Measure Help from another person eating meals?: None Help from another person taking care of personal grooming?: A Little Help from another person toileting, which includes using toliet, bedpan, or urinal?: A Little Help from another person bathing (including washing, rinsing, drying)?: A Little Help from another person to put on and taking off regular upper body clothing?: None Help from another person to put on and taking off regular lower body clothing?: A Little 6 Click Score: 20   End of Session Nurse Communication: Mobility status  Activity Tolerance: Patient limited by lethargy Patient left: in bed;with call bell/phone within reach;with family/visitor present  OT Visit Diagnosis: Unsteadiness on feet (R26.81)                Time: [id number] OT Time Calculation (min): 17 min Charges:  OT General Charges $OT Visit: 1 Visit OT Evaluation $OT Eval Moderate Complexity: 1 Mod  Gyanna Jarema, OTR/L Acute Rehabilitation Services Pager [phone] Office [phone]    [place],  Ellard Artis M 01/31/2019, 3:36 PM

## 2019-02-01 LAB — BLOOD GAS, ARTERIAL
Acid-Base Excess: 1.6 mmol/L (ref 0.0–2.0)
Bicarbonate: 25.4 mmol/L (ref 20.0–28.0)
Drawn by: 27553
FIO2: 0.21
O2 Saturation: 95.2 %
Patient temperature: 98.6
pCO2 arterial: 38.9 mmHg (ref 32.0–48.0)
pH, Arterial: 7.431 (ref 7.350–7.450)
pO2, Arterial: 74.5 mmHg — ABNORMAL LOW (ref 83.0–108.0)

## 2019-02-01 LAB — COMPREHENSIVE METABOLIC PANEL
ALT: 35 U/L (ref 0–44)
AST: 31 U/L (ref 15–41)
Albumin: 3.4 g/dL — ABNORMAL LOW (ref 3.5–5.0)
Alkaline Phosphatase: 55 U/L (ref 38–126)
Anion gap: 10 (ref 5–15)
BUN: 14 mg/dL (ref 8–23)
CO2: 24 mmol/L (ref 22–32)
Calcium: 9 mg/dL (ref 8.9–10.3)
Chloride: 100 mmol/L (ref 98–111)
Creatinine, Ser: 0.91 mg/dL (ref 0.61–1.24)
GFR calc Af Amer: >60 mL/min (ref >60)
GFR calc non Af Amer: >60 mL/min (ref >60)
Glucose, Bld: 203 mg/dL — ABNORMAL HIGH (ref 70–99)
Potassium: 4 mmol/L (ref 3.5–5.1)
Sodium: 134 mmol/L — ABNORMAL LOW (ref 135–145)
Total Bilirubin: 1.2 mg/dL (ref 0.3–1.2)
Total Protein: 6.1 g/dL — ABNORMAL LOW (ref 6.5–8.1)

## 2019-02-01 LAB — GLUCOSE, CAPILLARY
Glucose-Capillary: 164 mg/dL — ABNORMAL HIGH (ref 70–99)
Glucose-Capillary: 175 mg/dL — ABNORMAL HIGH (ref 70–99)

## 2019-02-01 LAB — CBC WITH DIFFERENTIAL/PLATELET
Abs Immature Granulocytes: 0.01 10*3/uL (ref 0.00–0.07)
Basophils Absolute: 0 10*3/uL (ref 0.0–0.1)
Basophils Relative: 1 %
Eosinophils Absolute: 0.1 10*3/uL (ref 0.0–0.5)
Eosinophils Relative: 2 %
HCT: 42.1 % (ref 39.0–52.0)
Hemoglobin: 14.3 g/dL (ref 13.0–17.0)
Immature Granulocytes: 0 %
Lymphocytes Relative: 37 %
Lymphs Abs: 1.5 10*3/uL (ref 0.7–4.0)
MCH: 31.1 pg (ref 26.0–34.0)
MCHC: 34 g/dL (ref 30.0–36.0)
MCV: 91.5 fL (ref 80.0–100.0)
Monocytes Absolute: 0.4 10*3/uL (ref 0.1–1.0)
Monocytes Relative: 9 %
Neutro Abs: 2.1 10*3/uL (ref 1.7–7.7)
Neutrophils Relative %: 51 %
Platelets: 55 10*3/uL — ABNORMAL LOW (ref 150–400)
RBC: 4.6 MIL/uL (ref 4.22–5.81)
RDW: 12.9 % (ref 11.5–15.5)
WBC: 4.1 10*3/uL (ref 4.0–10.5)
nRBC: 0 % (ref 0.0–0.2)

## 2019-02-01 LAB — AMMONIA: Ammonia: 60 umol/L — ABNORMAL HIGH (ref 9–35)

## 2019-02-01 MED ORDER — VERAPAMIL HCL ER 120 MG PO TBCR: 120.0000 mg | EXTENDED_RELEASE_TABLET | Freq: Every day | ORAL | 0 refills | Status: DC

## 2019-02-01 NOTE — Plan of Care (Signed)
  Problem: Activity: Goal: Risk for activity intolerance will decrease Outcome: Progressing   Problem: Nutrition: Goal: Risk of aspiration will decrease Outcome: Progressing

## 2019-02-01 NOTE — Discharge Summary (Signed)
Physician Discharge Summary  Christian Mccall [id number] DOB: 11-14-1955 DOA: 01/30/2019  PCP: Shon Baton, MD  Admit date: 01/30/2019 Discharge date: 02/01/2019  Admitted From: home  Disposition:  Home   Recommendations for Outpatient Follow-up:  1. Follow up with PCP in 1-2 weeks 2. Please obtain platelets in one week.   Home Health:PT  Equipment/Devices:NA   Discharge Condition:stable   CODE STATUS:full code  Diet recommendation: low carb diet   Brief/Interim Summary: 63 year old gentleman with history of GERD, COPD, chronic hypoxic failure on 2 L oxygen, hypertension, hyperlipidemia, type 2 diabetes with neuropathy, CAD status post CABG, recent cardiac catheterization, AAA status post endovascular repair complicated by leak, history of TIA , stroke and multiple episodes of left hemiparesis self controlled admitted to the hospital with acute onset of diplopia.  Symptoms resolving by the time of presentation.  Was not a candidate for TPA because of resolution of symptoms on presentation.  Discharge Diagnoses:  Principal Problem:   Diplopia Active Problems:   Essential hypertension, benign   CAD (coronary artery disease) of artery bypass graft   Left facial numbness   Diabetes (HCC)   TIA (transient ischemic attack)  Acute onset diplopia and unsteady gait: With history of intermittent episodes of left-sided facial droop, frequent self-limiting events at home.  Presumed TIA. However complicated migraine also likely.  No neurological deficits now. CT head findings, normal on presentation. MRI of the brain, 5 mm focus of increased diffusion of the posterior right frontal region suspected artifact.  Neurology do not think it is an infarction. Carotid Doppler, normal.  EEG was normal. 2D echocardiogram, normal Antiplatelet therapy, chronic thrombocytopenia so patient is on aspirin. LDL 25, already on Vytorin and fenofibrate. Hemoglobin A1c, 8.8 on insulin, counseled about  compliance. Neurology recommended treatment as complicated migraine, started on verapamil.  Will need outpatient follow-up.  Will discontinue losartan as his blood pressures are low normal with restarting verapamil. Platelets are 55,000, neurology recommended only aspirin until platelets are 75,000 due to high risk of bleeding.  Needs recheck in about a week. Seen by physical therapy, recommended home health PT to improve balance and gait.  Prescribed.  Excessive somnolence: Patient demonstrated excessive somnolence ongoing intermittently for many many years. EEG was normal. No sleep apnea.  Blood gas analysis was normal. Ammonia 60, other liver function test normal. Believe this is most likely due to polypharmacy, he is on Klonopin, Lyrica, oxycodone and Lexapro.  However, patient and his wife do not believe this is due to polypharmacy.  Since we do not have other explanations, I counseled them to try to cut down on medications.  If he is also sleepy, he may not need so many sleeping medications.  I advised him not to take any of these medications in the morning, only take nighttime doses.  They will try.  He was advised strictly not to drive.  He also has developed some forgetfulness.  Patient agrees.  Discharge Instructions  Discharge Instructions    Diet - low sodium heart healthy   Complete by: As directed    Discharge instructions   Complete by: As directed    Only take Klonopin, Lyrica doses at night as able.  Avoid taking opiate medicine as much as possible. Do not drive until seen in follow-up and your somnolence problems improve.   Increase activity slowly   Complete by: As directed      Allergies as of 02/01/2019   No Known Allergies     Medication List  STOP taking these medications   clopidogrel 75 MG tablet Commonly known as: PLAVIX   losartan 50 MG tablet Commonly known as: COZAAR     TAKE these medications   Advair Diskus 100-50 MCG/DOSE Aepb Generic drug:  Fluticasone-Salmeterol Inhale 1 puff into the lungs 2 (two) times daily.   albuterol 108 (90 Base) MCG/ACT inhaler Commonly known as: VENTOLIN HFA Inhale 2 puffs into the lungs every 6 (six) hours as needed for wheezing or shortness of breath.   aspirin EC 81 MG tablet Take 1 tablet (81 mg total) by mouth daily.   clonazePAM 1 MG tablet Commonly known as: KLONOPIN Take 1 mg by mouth 2 (two) times daily.   cyanocobalamin 1000 MCG/ML injection Commonly known as: (VITAMIN B-12) Inject 1,000 mcg into the muscle See admin instructions. Inject 1000 mcg intramuscularly once every 3 months   escitalopram 20 MG tablet Commonly known as: LEXAPRO Take 20 mg by mouth at bedtime.   ezetimibe-simvastatin 10-40 MG tablet Commonly known as: VYTORIN Take 1 tablet by mouth at bedtime.   fenofibrate 160 MG tablet Take 160 mg by mouth every morning.   furosemide 40 MG tablet Commonly known as: LASIX Take 40 mg by mouth every other day.   glimepiride 4 MG tablet Commonly known as: AMARYL Take 4 mg by mouth at bedtime.   insulin glargine 100 unit/mL Sopn Commonly known as: LANTUS Inject 20 Units into the skin at bedtime.   Jardiance 10 MG Tabs tablet Generic drug: empagliflozin Take 10 mg by mouth every morning.   lansoprazole 30 MG capsule Commonly known as: PREVACID Take 30 mg by mouth every morning.   metFORMIN 1000 MG tablet Commonly known as: GLUCOPHAGE Take 1,000 mg by mouth daily with breakfast.   metoprolol tartrate 50 MG tablet Commonly known as: LOPRESSOR Take 50 mg by mouth 2 (two) times daily.   nitroGLYCERIN 0.4 MG SL tablet Commonly known as: NITROSTAT Place 1 tablet (0.4 mg total) under the tongue every 5 (five) minutes as needed for chest pain. What changed: Another medication with the same name was changed. Make sure you understand how and when to take each.   nitroGLYCERIN 0.4 mg/hr patch Commonly known as: NITRODUR - Dosed in mg/24 hr Apply patch in the  morning, remove at bedtime What changed:   how much to take  how to take this  when to take this  additional instructions   oxyCODONE-acetaminophen 5-325 MG tablet Commonly known as: Percocet Take 1 tablet by mouth every 4 (four) hours as needed (max 6 q). What changed:   when to take this  reasons to take this   OXYGEN Inhale into the lungs. 2L at night   pramipexole 0.25 MG tablet Commonly known as: MIRAPEX Take 0.25 mg by mouth at bedtime.   pregabalin 100 MG capsule Commonly known as: LYRICA Take 100-200 mg by mouth See admin instructions. Take 1 tablet (100 mg) by mouth every morning and 2 tablets (200 mg) at bedtime   ranolazine 1000 MG SR tablet Commonly known as: Ranexa Take 1 tablet (1,000 mg total) by mouth 2 (two) times daily.   terazosin 1 MG capsule Commonly known as: HYTRIN Take 1 mg by mouth at bedtime.   tiotropium 18 MCG inhalation capsule Commonly known as: SPIRIVA Place 18 mcg into inhaler and inhale every morning.   verapamil 120 MG CR tablet Commonly known as: CALAN-SR Take 1 tablet (120 mg total) by mouth daily. Start taking on: February 02, 2019   Vitamin  D 50 MCG (2000 UT) tablet Take 2,000 Units by mouth 2 (two) times daily.      Follow-up Information    Interim Follow up.   Why: They will contact you for the first home visit. Contact information: [phone]         No Known Allergies  Consultations:  Neurology    Procedures/Studies: Ct Angio Head W Or Wo Contrast  Result Date: 01/30/2019 CLINICAL DATA:  Stroke, follow up. Diplopia. Left facial numbness. TIA. EXAM: CT ANGIOGRAPHY HEAD AND NECK TECHNIQUE: Multidetector CT imaging of the head and neck was performed using the standard protocol during bolus administration of intravenous contrast. Multiplanar CT image reconstructions and MIPs were obtained to evaluate the vascular anatomy. Carotid stenosis measurements (when applicable) are obtained utilizing NASCET  criteria, using the distal internal carotid diameter as the denominator. CONTRAST:  13mL OMNIPAQUE IOHEXOL 350 MG/ML SOLN COMPARISON:  MRI brain 01/30/2019 FINDINGS: CT HEAD FINDINGS Brain: Mild atrophy and moderate diffuse white matter disease is again noted. Remote lacunar infarcts are present in the right caudate head. No acute hemorrhage or mass lesion is present. The ventricles are of normal size. No significant extraaxial fluid collection is present. The brainstem and cerebellum are within normal limits. Vascular: Atherosclerotic changes are present within the cavernous internal carotid arteries bilaterally. There is no hyperdense vessel. Skull: Calvarium is intact. No focal lytic or blastic lesions are present. No significant extracranial soft tissue injury is present. Sinuses: The paranasal sinuses and mastoid air cells are clear. Orbits: The globes and orbits are within normal limits. Review of the MIP images confirms the above findings CTA NECK FINDINGS Aortic arch: A 3 vessel arch configuration is present. Atherosclerotic changes are present without significant stenosis at the great vessel origins or aneurysm. Right carotid system: The right common carotid artery demonstrates dense atherosclerotic calcifications in the anterior wall without a significant stenosis relative to the more distal vessel. The right carotid bifurcation demonstrate significant noncalcified plaque posteriorly. The lumen is narrowed to 1.8 mm. This compares with a more distal measurement of 3.8 mm. Additional stenoses are present. Cervical left ICA is otherwise normal. Left carotid system: The left common carotid artery demonstrates mild mural calcification without significant stenosis. Dense calcifications are present at the carotid bifurcation without a significant stenosis relative to the more distal vessel. The cervical left ICA is otherwise normal. Vertebral arteries: The vertebral arteries originate from the subclavian  arteries bilaterally. There is calcification at the origin of both vertebral arteries. A 50% stenosis is present on the right. No significant stenosis is present on the left. The left vertebral artery is slightly dominant to the right. No tandem stenosis is present in either vertebral artery in the neck. Skeleton: Mild endplate degenerative changes are present. There is straightening and slight reversal of the normal cervical lordosis. Degenerative anterolisthesis is present at C2-3. The patient is edentulous. No focal lytic or blastic lesions are present. Other neck: The soft tissues of the neck are otherwise unremarkable. There is mild heterogeneity of the thyroid without a dominant lesion. No significant adenopathy is present. Salivary glands are within normal limits. No focal mucosal or submucosal lesions are present. Upper chest: The lung apices are clear. No focal nodule, mass, or airspace disease is present. Review of the MIP images confirms the above findings CTA HEAD FINDINGS Anterior circulation: Atherosclerotic changes are present within the cavernous right ICA without a significant stenosis relative to the more distal vessel. Calcifications are also present in the cavernous left  internal carotid artery. There is a 50% stenosis relative to the more distal vessel. ICA termini are within the normal limits bilaterally. The A1 and M1 segments are normal. The anterior communicating artery is patent. MCA bifurcations are intact. There is some attenuation of distal ACA and MCA branch vessels without a significant proximal stenosis or occlusion. Posterior circulation: The left vertebral artery is the dominant vessel. PICA origins are visualized and normal. The vertebrobasilar junction is normal. Basilar artery is normal. Both posterior cerebral arteries originate from basilar tip. There is some attenuation of distal PCA branch vessels without a significant proximal stenosis or occlusion. Venous sinuses: The dural  sinuses are patent. Straight sinus deep cerebral veins are intact. Cortical veins are unremarkable. Anatomic variants: None Review of the MIP images confirms the above findings IMPRESSION: 1. Approximately 50% stenosis of the proximal right ICA secondary to prominent noncalcified plaque. 2. Atherosclerotic changes at the aortic arch and within the common carotid arteries bilaterally without significant stenosis. 3. Atherosclerotic calcifications within the cavernous ICA bilaterally. A 50% stenosis is present on the left. 4. Mild distal disease without a significant proximal stenosis, aneurysm, or branch vessel occlusion within the circle-of-Willis. 5. 50% stenosis at the origin of the right vertebral artery. 6. Mild degenerative changes of the cervical spine. Electronically Signed   By: San Morelle M.D.   On: 01/30/2019 09:01   Ct Angio Neck W Or Wo Contrast  Result Date: 01/30/2019 CLINICAL DATA:  Stroke, follow up. Diplopia. Left facial numbness. TIA. EXAM: CT ANGIOGRAPHY HEAD AND NECK TECHNIQUE: Multidetector CT imaging of the head and neck was performed using the standard protocol during bolus administration of intravenous contrast. Multiplanar CT image reconstructions and MIPs were obtained to evaluate the vascular anatomy. Carotid stenosis measurements (when applicable) are obtained utilizing NASCET criteria, using the distal internal carotid diameter as the denominator. CONTRAST:  66mL OMNIPAQUE IOHEXOL 350 MG/ML SOLN COMPARISON:  MRI brain 01/30/2019 FINDINGS: CT HEAD FINDINGS Brain: Mild atrophy and moderate diffuse white matter disease is again noted. Remote lacunar infarcts are present in the right caudate head. No acute hemorrhage or mass lesion is present. The ventricles are of normal size. No significant extraaxial fluid collection is present. The brainstem and cerebellum are within normal limits. Vascular: Atherosclerotic changes are present within the cavernous internal carotid arteries  bilaterally. There is no hyperdense vessel. Skull: Calvarium is intact. No focal lytic or blastic lesions are present. No significant extracranial soft tissue injury is present. Sinuses: The paranasal sinuses and mastoid air cells are clear. Orbits: The globes and orbits are within normal limits. Review of the MIP images confirms the above findings CTA NECK FINDINGS Aortic arch: A 3 vessel arch configuration is present. Atherosclerotic changes are present without significant stenosis at the great vessel origins or aneurysm. Right carotid system: The right common carotid artery demonstrates dense atherosclerotic calcifications in the anterior wall without a significant stenosis relative to the more distal vessel. The right carotid bifurcation demonstrate significant noncalcified plaque posteriorly. The lumen is narrowed to 1.8 mm. This compares with a more distal measurement of 3.8 mm. Additional stenoses are present. Cervical left ICA is otherwise normal. Left carotid system: The left common carotid artery demonstrates mild mural calcification without significant stenosis. Dense calcifications are present at the carotid bifurcation without a significant stenosis relative to the more distal vessel. The cervical left ICA is otherwise normal. Vertebral arteries: The vertebral arteries originate from the subclavian arteries bilaterally. There is calcification at the origin of both vertebral  arteries. A 50% stenosis is present on the right. No significant stenosis is present on the left. The left vertebral artery is slightly dominant to the right. No tandem stenosis is present in either vertebral artery in the neck. Skeleton: Mild endplate degenerative changes are present. There is straightening and slight reversal of the normal cervical lordosis. Degenerative anterolisthesis is present at C2-3. The patient is edentulous. No focal lytic or blastic lesions are present. Other neck: The soft tissues of the neck are  otherwise unremarkable. There is mild heterogeneity of the thyroid without a dominant lesion. No significant adenopathy is present. Salivary glands are within normal limits. No focal mucosal or submucosal lesions are present. Upper chest: The lung apices are clear. No focal nodule, mass, or airspace disease is present. Review of the MIP images confirms the above findings CTA HEAD FINDINGS Anterior circulation: Atherosclerotic changes are present within the cavernous right ICA without a significant stenosis relative to the more distal vessel. Calcifications are also present in the cavernous left internal carotid artery. There is a 50% stenosis relative to the more distal vessel. ICA termini are within the normal limits bilaterally. The A1 and M1 segments are normal. The anterior communicating artery is patent. MCA bifurcations are intact. There is some attenuation of distal ACA and MCA branch vessels without a significant proximal stenosis or occlusion. Posterior circulation: The left vertebral artery is the dominant vessel. PICA origins are visualized and normal. The vertebrobasilar junction is normal. Basilar artery is normal. Both posterior cerebral arteries originate from basilar tip. There is some attenuation of distal PCA branch vessels without a significant proximal stenosis or occlusion. Venous sinuses: The dural sinuses are patent. Straight sinus deep cerebral veins are intact. Cortical veins are unremarkable. Anatomic variants: None Review of the MIP images confirms the above findings IMPRESSION: 1. Approximately 50% stenosis of the proximal right ICA secondary to prominent noncalcified plaque. 2. Atherosclerotic changes at the aortic arch and within the common carotid arteries bilaterally without significant stenosis. 3. Atherosclerotic calcifications within the cavernous ICA bilaterally. A 50% stenosis is present on the left. 4. Mild distal disease without a significant proximal stenosis, aneurysm, or  branch vessel occlusion within the circle-of-Willis. 5. 50% stenosis at the origin of the right vertebral artery. 6. Mild degenerative changes of the cervical spine. Electronically Signed   By: San Morelle M.D.   On: 01/30/2019 09:01   Mr Brain Wo Contrast  Result Date: 01/30/2019 CLINICAL DATA:  Initial evaluation for acute diplopia, slight left-sided facial numbness and droop. Symptoms now resolved. EXAM: MRI HEAD WITHOUT CONTRAST TECHNIQUE: Multiplanar, multiecho pulse sequences of the brain and surrounding structures were obtained without intravenous contrast. COMPARISON:  Prior brain MRI from 12/07/2013. FINDINGS: Brain: Cerebral volume within normal limits for age. Patchy and confluent T2/FLAIR hyperintensity within the periventricular and deep white matter both cerebral hemispheres most consistent with chronic microvascular ischemic disease, moderate nature, progressed from previous. Few small remote lacunar infarcts noted at the right caudate head. Single punctate 5 mm focus of minimally increased diffusion signal within the subcortical white matter of the posterior right frontal region (series 5, image 80). No discernible corresponding ADC correlate. While this finding could reflect artifact/T2 shine through, sequelae of late subacute small vessel ischemia would be difficult to exclude. No other diffusion abnormality to suggest acute or early subacute ischemia to explain patient's symptoms. Gray-white matter differentiation maintained. No encephalomalacia to suggest chronic cortical infarction. No foci of susceptibility artifact to suggest acute or chronic intracranial hemorrhage. No  mass lesion, midline shift or mass effect. No hydrocephalus. No extra-axial fluid collection. Pituitary gland suprasellar region normal. Midline structures intact. Vascular: Major intracranial vascular flow voids are maintained. Skull and upper cervical spine: Craniocervical junction within normal limits. Upper  cervical spine normal. Bone marrow signal intensity within normal limits. Scalp soft tissues within normal limits. Sinuses/Orbits: Globes and orbital soft tissues within normal limits. Paranasal sinuses are clear. Small left mastoid effusion, of doubtful significance. Inner ear structures grossly normal. Other: None. IMPRESSION: 1. No acute intracranial abnormality identified. 2. 5 mm focus of mildly increased diffusion signal within the subcortical white matter of the posterior right frontal region, which could reflect a small focus of artifact/T2 shine through or possibly late subacute ischemia. Finding is not felt to be acute in nature. 3. Underlying moderate chronic microvascular ischemic disease with multiple remote lacunar infarcts involving the right caudate, progressed relative to 2015. Electronically Signed   By: Jeannine Boga M.D.   On: 01/30/2019 03:28   Mr Abdomen W Wo Contrast  Result Date: 01/30/2019 CLINICAL DATA:  Liver lesions seen on previous ultrasound exam. EXAM: MRI ABDOMEN WITHOUT AND WITH CONTRAST TECHNIQUE: Multiplanar multisequence MR imaging of the abdomen was performed both before and after the administration of intravenous contrast. CONTRAST:  9 cc Gadavist COMPARISON:  Ultrasound exam 10/13/2018 FINDINGS: Lower chest: Unremarkable. Hepatobiliary: Liver measures 22.8 cm craniocaudal length. Nodular liver contour consistent with cirrhosis. Tiny signal void in the subcapsular dome of liver is compatible with granuloma seen on previous CT. Arterial phase postcontrast imaging shows a subtle 10 mm focus of hyperenhancement in the inferior right liver (segment V on 73/13). This lesion shows no differential washout on later post-contrast imaging and has no enhancing rim. There is no evidence for gallstones, gallbladder wall thickening, or pericholecystic fluid. No intrahepatic or extrahepatic biliary dilation. Pancreas: No focal mass lesion. No dilatation of the main duct. No  intraparenchymal cyst. No peripancreatic edema. Spleen:  Spleen measures 19.2 cm craniocaudal length, enlarged. Adrenals/Urinary Tract: No adrenal nodule or mass. Kidneys unremarkable. Stomach/Bowel: Stomach is unremarkable. No gastric wall thickening. No evidence of outlet obstruction. Duodenum is normally positioned as is the ligament of Treitz. No small bowel or colonic dilatation within the visualized abdomen. Vascular/Lymphatic: Abdominal aortic aneurysm noted, status post endograft. Portal vein, superior mesenteric vein, and splenic vein are patent. There is no gastrohepatic or hepatoduodenal ligament lymphadenopathy. No intraperitoneal or retroperitoneal lymphadenopathy. Other:  No intraperitoneal free fluid. Musculoskeletal: No abnormal marrow enhancement within the visualized bony anatomy. IMPRESSION: 1. Cirrhotic liver morphology. 2. 10 mm enhancing lesion identified inferior right liver without differential washout or rim enhancement. This is compatible with LI-RADS 3 category and follow-up MRI in 3-6 months recommended to re-evaluate. 3. Splenomegaly consistent with portal venous hypertension. Electronically Signed   By: Misty Stanley M.D.   On: 01/30/2019 20:43      Subjective: Patient seen and examined.  No overnight events.  He walked with physical therapy, then he felt sleepy even with standing.  No orthostatic changes.  No neurological deficits.   Discharge Exam: Vitals:   02/01/19 1000 02/01/19 1106  BP: 138/72 (!) 157/66  Pulse: 60 61  Resp:  18  Temp:  98.5 F (36.9 C)  SpO2:  96%   Vitals:   02/01/19 0420 02/01/19 0808 02/01/19 1000 02/01/19 1106  BP: 112/69 (!) 104/49 138/72 (!) 157/66  Pulse: (!) 57 (!) 56 60 61  Resp: 18 18  18   Temp: 98.4 F (36.9 C) 98.1 F (  36.7 C)  98.5 F (36.9 C)  TempSrc: Oral Oral  Oral  SpO2: 96% 100%  96%  Weight:      Height:        General: Pt is alert, awake, not in acute distress Cardiovascular: RRR, S1/S2 +, no rubs, no  gallops Respiratory: CTA bilaterally, no wheezing, no rhonchi Abdominal: Soft, NT, ND, bowel sounds + Extremities: no edema, no cyanosis    The results of significant diagnostics from this hospitalization (including imaging, microbiology, ancillary and laboratory) are listed below for reference.     Microbiology: No results found for this or any previous visit (from the past 240 hour(s)).   Labs: BNP (last 3 results) No results for input(s): BNP in the last 8760 hours. Basic Metabolic Panel: Recent Labs  Lab 01/30/19 0415 02/01/19 1132  NA 136 134*  K 3.5 4.0  CL 102 100  CO2 24 24  GLUCOSE 166* 203*  BUN 10 14  CREATININE 0.73 0.91  CALCIUM 8.6* 9.0   Liver Function Tests: Recent Labs  Lab 02/01/19 1132  AST 31  ALT 35  ALKPHOS 55  BILITOT 1.2  PROT 6.1*  ALBUMIN 3.4*   No results for input(s): LIPASE, AMYLASE in the last 168 hours. Recent Labs  Lab 02/01/19 1132  AMMONIA 60*   CBC: Recent Labs  Lab 01/30/19 0415 01/31/19 1032 02/01/19 1132  WBC 4.2 4.5 4.1  NEUTROABS  --   --  2.1  HGB 14.2 14.7 14.3  HCT 41.6 43.3 42.1  MCV 91.4 91.2 91.5  PLT 50* 63* 55*   Cardiac Enzymes: No results for input(s): CKTOTAL, CKMB, CKMBINDEX, TROPONINI in the last 168 hours. BNP: Invalid input(s): POCBNP CBG: Recent Labs  Lab 01/31/19 1218 01/31/19 1617 01/31/19 2122 02/01/19 0625 02/01/19 1213  GLUCAP 384* 319* 247* 164* 175*   D-Dimer No results for input(s): DDIMER in the last 72 hours. Hgb A1c Recent Labs    01/30/19 0412  HGBA1C 8.8*   Lipid Profile Recent Labs    01/30/19 0412  CHOL 103  HDL 25*  LDLCALC 25  TRIG 267*  CHOLHDL 4.1   Thyroid function studies No results for input(s): TSH, T4TOTAL, T3FREE, THYROIDAB in the last 72 hours.  Invalid input(s): FREET3 Anemia work up No results for input(s): VITAMINB12, FOLATE, FERRITIN, TIBC, IRON, RETICCTPCT in the last 72 hours. Urinalysis    Component Value Date/Time   COLORURINE  YELLOW 01/06/2017 1402   APPEARANCEUR CLEAR 01/06/2017 1402   LABSPEC 1.029 01/06/2017 1402   PHURINE 6.0 01/06/2017 1402   GLUCOSEU >=500 (A) 01/06/2017 1402   HGBUR NEGATIVE 01/06/2017 1402   BILIRUBINUR NEGATIVE 01/06/2017 1402   KETONESUR 5 (A) 01/06/2017 1402   PROTEINUR NEGATIVE 01/06/2017 1402   UROBILINOGEN 0.2 01/14/2011 1351   NITRITE NEGATIVE 01/06/2017 1402   LEUKOCYTESUR NEGATIVE 01/06/2017 1402   Sepsis Labs Invalid input(s): PROCALCITONIN,  WBC,  LACTICIDVEN Microbiology No results found for this or any previous visit (from the past 240 hour(s)).   Time coordinating discharge: 35 minutes  SIGNED:   Barb Merino, MD  [facility] 02/01/2019, 3:38 PM

## 2019-02-01 NOTE — TOC Transition Note (Signed)
Transition of Care [facility]) - CM/SW Discharge Note   Patient Details  Name: Christian Mccall MRN: [id number] Date of Birth: [birth year]  Transition of Care [facility]) CM/SW Contact:  Pollie Friar, RN Phone Number: 02/01/2019, 3:46 PM   Clinical Narrative:    Pt to [facility] back [place] with [facility] services. [facility] in [place] has accepted the referral.  Wife to provide supervision at home and transportation to home.    Final next level of care: Home w Home Health Services Barriers to Discharge: No Barriers Identified   Patient Goals and CMS Choice   CMS Medicare.gov Compare Post Acute Care list provided to:: Patient Represenative (must comment) Choice offered to / list presented to : Spouse  Discharge Placement                       Discharge Plan and Services                          HH Arranged: PT [facility] Agency: [facility] Date [place]: 02/01/19   Representative spoke with at [place]: [place] ([place]) Interventions     Readmission Risk Interventions No flowsheet data found.

## 2019-02-01 NOTE — Progress Notes (Addendum)
STROKE TEAM PROGRESS NOTE   INTERVAL HISTORY Pt wife at bedside. Pt is sitting in bed for lunch. Stated that he slept whole morning, now wake for lunch, he will be sleeping again after lunch. He still feel drowsy.    OBJECTIVE Vitals:   02/01/19 0420 02/01/19 0808 02/01/19 1000 02/01/19 1106  BP: 112/69 (!) 104/49 138/72 (!) 157/66  Pulse: (!) 57 (!) 56 60 61  Resp: 18 18  18   Temp: 98.4 F (36.9 C) 98.1 F (36.7 C)  98.5 F (36.9 C)  TempSrc: Oral Oral  Oral  SpO2: 96% 100%  96%  Weight:      Height:        CBC:  Recent Labs  Lab 01/31/19 1032 02/01/19 1132  WBC 4.5 4.1  NEUTROABS  --  2.1  HGB 14.7 14.3  HCT 43.3 42.1  MCV 91.2 91.5  PLT 63* 55*    Basic Metabolic Panel:  Recent Labs  Lab 01/30/19 0415 02/01/19 1132  NA 136 134*  K 3.5 4.0  CL 102 100  CO2 24 24  GLUCOSE 166* 203*  BUN 10 14  CREATININE 0.73 0.91  CALCIUM 8.6* 9.0    Lipid Panel:     Component Value Date/Time   CHOL 103 01/30/2019 0412   TRIG 267 (H) 01/30/2019 0412   HDL 25 (L) 01/30/2019 0412   CHOLHDL 4.1 01/30/2019 0412   VLDL 53 (H) 01/30/2019 0412   LDLCALC 25 01/30/2019 0412   HgbA1c:  Lab Results  Component Value Date   HGBA1C 8.8 (H) 01/30/2019   Urine Drug Screen:     Component Value Date/Time   LABOPIA NONE DETECTED 01/30/2019 1240   COCAINSCRNUR NONE DETECTED 01/30/2019 1240   LABBENZ NONE DETECTED 01/30/2019 1240   AMPHETMU NONE DETECTED 01/30/2019 1240   THCU NONE DETECTED 01/30/2019 1240   LABBARB NONE DETECTED 01/30/2019 1240    Alcohol Level No results found for: ETH  IMAGING  Ct Angio Head W Or Wo Contrast Ct Angio Neck W Or Wo Contrast 01/30/2019 IMPRESSION:  1. Approximately 50% stenosis of the proximal right ICA secondary to prominent noncalcified plaque.  2. Atherosclerotic changes at the aortic arch and within the common carotid arteries bilaterally without significant stenosis.  3. Atherosclerotic calcifications within the cavernous ICA  bilaterally. A 50% stenosis is present on the left.  4. Mild distal disease without a significant proximal stenosis, aneurysm, or branch vessel occlusion within the circle-of-Willis.  5. 50% stenosis at the origin of the right vertebral artery.  6. Mild degenerative changes of the cervical spine.   Mr Brain Wo Contrast 01/30/2019 IMPRESSION:  1. No acute intracranial abnormality identified.  2. 5 mm focus of mildly increased diffusion signal within the subcortical white matter of the posterior right frontal region, which could reflect a small focus of artifact/T2 shine through or possibly late subacute ischemia. Finding is not felt to be acute in nature.  By my read, the described focus likely to be T2 shine through instead of subacute stroke. 3. Underlying moderate chronic microvascular ischemic disease with multiple remote lacunar infarcts involving the right caudate, progressed relative to 2015.   Transthoracic Echocardiogram  01/31/2019 IMPRESSIONS  1. The left ventricle has normal systolic function with an ejection fraction of 60-65%. The cavity size was normal. There is mildly increased left ventricular wall thickness. Left ventricular diastolic Doppler parameters are consistent with pseudonormalization.  2. The right ventricle has normal systolic function. The cavity was normal. There is no increase  in right ventricular wall thickness.  3. No evidence of mitral valve stenosis.  4. The tricuspid valve is grossly normal.  5. The aortic valve is grossly normal. No stenosis of the aortic valve.  6. The aorta is normal unless otherwise noted.  7. The aortic root and ascending aorta are normal in size and structure.  8. The atrial septum is grossly normal.  EEG  IMPRESSION: This study is within normal limits. No seizures or epileptiform discharges were seen throughout the recording.   PHYSICAL EXAM    Temp:  [97.7 F (36.5 C)-98.5 F (36.9 C)] 98.5 F (36.9 C) (08/24 1106) Pulse  Rate:  [56-65] 61 (08/24 1106) Resp:  [18-20] 18 (08/24 1106) BP: (104-157)/(49-72) 157/66 (08/24 1106) SpO2:  [92 %-100 %] 96 % (08/24 1106)  General - Well nourished, well developed, in no apparent distress, sleepy but able to answer questions following commands.  Ophthalmologic - fundi not visualized due to noncooperation.  Cardiovascular - Regular rate and rhythm.  Mental Status -  Sleepy but orientation to time, place, and person were intact. Language including expression, naming, repetition, comprehension was assessed and found intact.  Cranial Nerves II - XII - II - Visual field intact OU. III, IV, VI - Extraocular movements intact. V - Facial sensation intact bilaterally. VII - Facial movement intact bilaterally. VIII - Hearing & vestibular intact bilaterally. X - Palate elevates symmetrically. XI - Chin turning & shoulder shrug intact bilaterally. XII - Tongue protrusion intact.  Motor Strength - The patient's strength was normal in all extremities and pronator drift was absent.  Bulk was normal and fasciculations were absent.   Motor Tone - Muscle tone was assessed at the neck and appendages and was normal.  Reflexes - The patient's reflexes were symmetrical in all extremities and he had no pathological reflexes.  Sensory - Light touch, temperature/pinprick were assessed and were symmetrical.    Coordination - The patient had normal movements in the hands with no ataxia or dysmetria.  Tremor was absent.  Gait and Station - deferred.   ASSESSMENT/PLAN Mr. KERR SOTTO is a 63 y.o. male with history of hypertension, hyperlipidemia, cirrhosis, AAA, COPD, hx of thrombocytopenia, DM, CAD (MI / stents / CABG), hyperlipidemia, and multiple TIAs  presenting with transient double vision and unsteady gait.[name] He did not receive IV t-PA due to resolution of deficits.   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  MRI head - Underlying moderate chronic microvascular ischemic disease with multiple remote lacunar infarcts involving the right caudate head, progressed relative to 2015.   CTA H&N - 50% stenosis bilateral ICAs and 50% stenosis origin of right VA.  2D Echo - EF 60 - 65%. No cardiac source of emboli identified.   EEG - normal  LDL - 25  HgbA1c - 8.8  UDS negative  VTE prophylaxis - SCDs  aspirin 81 mg daily and clopidogrel 75 mg daily prior to admission, now on aspirin 81 mg daily.  Plavix discontinued for now due to thrombocytopenia with platelet 50.  Once platelet more than 75, Plavix can be resumed.  Therapy recommendations:  HH PT, no OT  Disposition:  Pending  ??  Hemiplegic migraine  Recurrent frequent episode of left proptosis, left facial numbness weakness, left arm and leg weakness, occurring in sequence, lasting 10 to 15 minutes with mild headache for the last 18 years.  Stereotypical presentation  Mom have similar episodes  MRI  no significant infarcts  EEG no seizure  Initiated trial of verapamil.  If not effective, Diamox or Lamictal can be considered  BP monitoring  Episodes of sleepiness  As per wife, patient has episode of sleepiness 2-3 times per week  Episode lasting hours to a day  Separate from the hemiparesis episodes  Unclear etiology  ?? related to cirrhosis as his ammonia level elevated at 39  Follow-up with Dr. 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  On metoprolol, ARB, Lasix  Thrombocytopenia  Following with oncologist Dr. Alen Blew  Platelet 70->100->50->63->55  Hold off Plavix for now.  Once platelet more than 75, may resume Plavix  Continue close follow-up with Dr. Alen Blew  Hyperammonemia   Ammonia 60  Not sure if related to his drowsiness spells  May need to  monitor  Management as per primary team  Cirrhosis   Follows with Dr. Paulita Fujita  MRI abd w/wo - cirrhotic liver morphology, 37mm enhancing lesion needs follow up MRI in 3-6 months  Portal venous hypertension  Splenomegaly   Thrombocytopenia  Elevated ammonia  INR 1.2, normal LFTs  Hypertension  Home BP meds: Cozaar 50 mg QD, Hytrin 1 mg QD, metoprolol 50 mg Bid  Current BP meds: Cozaar 50 mg QD, Hytrin 1 mg QD, metoprolol 50 mg Bid  Stable  On verapamil CR 120  Close BP monitoring . Long-term BP goal normotensive  Hyperlipidemia  Home Lipid lowering medication:  Vytorin (Zetia / Zocor) 10/40 daily and fenofibrate 160 mg daily  LDL 25, goal < 70  Current lipid lowering medication:  Vytorin (Zetia / Zocor) 10/40 daily and fenofibrate 160 mg daily  Continue home meds at discharge  Diabetes  HgbA1c 8.8, goal < 7.0  Uncontrolled  SSI  CBG monitoring  On Lantus  Close PCP follow-up  Other Stroke Risk Factors  Advanced age  Former cigarette smoker - quit  ETOH use, advised to drink no more than 1 alcoholic beverage per day.  AAA with endovascular repair and known [id number]  Other Active Problems  Severe COPD with use of nocturnal oxygen  Mild hypotension - 104/49  Hospital day # 2  Neurology will sign off. Please call with questions. Pt will follow up with stroke clinic Dr. Leonie Man at [facility] in about 4 weeks. Thanks for the consult.   Rosalin Hawking, MD PhD Stroke Neurology 02/01/2019 2:19 PM   To contact Stroke Continuity provider, please refer to [email or web address] After hours, contact General Neurology

## 2019-02-01 NOTE — Progress Notes (Signed)
Physical Therapy Treatment Patient Details Name: Christian Mccall MRN: [id number] DOB: 23-Jun-1955 Today's Date: 02/01/2019    History of Present Illness Christian Mccall is a 63 y.o. male with a history of DM, hypertension, hyperlipidemia, CAD, COPD, emphysema, cirrhosis, thrombocytopenia, AAA, TIA who presents with transient double vision and unsteady gait. MRI showed multiple subacute infarcts but no acute intracranial abnormality     PT Comments    Patient progressing well towards PT goals. More alert and awake today initially. Pt became more sleepy towards end of session and ready to go to sleep. Reports this has been happening more frequently and these episodes have worsened since being in the hospital. Also noted to have left sided weakness.  Frustrated that no one can find out what is going on with him. Symptoms seems to be transient and in no particular pattern. Tolerated gait training and stair training with min guard assist/supervision for safety. Pt reports losing "train of thought" at times during conversation and this occurred today as well. Slow processing at times when having a tired episode. Will have support from wife. Will follow.   Follow Up Recommendations  Home health PT     Equipment Recommendations  None recommended by PT    Recommendations for Other Services       Precautions / Restrictions Precautions Precautions: Fall Precaution Comments: falls asleep without warning Restrictions Weight Bearing Restrictions: No    Mobility  Bed Mobility Overal bed mobility: Modified Independent Bed Mobility: Supine to Sit;Sit to Supine     Supine to sit: Modified independent (Device/Increase time);HOB elevated Sit to supine: Modified independent (Device/Increase time);HOB elevated   General bed mobility comments: No assist needed; HOB elevated.  Transfers Overall transfer level: Needs assistance Equipment used: None Transfers: Sit to/from Stand Sit to Stand: Min  guard         General transfer comment: min guard assist for safety.  Ambulation/Gait Ambulation/Gait assistance: Supervision Gait Distance (Feet): 300 Feet Assistive device: None Gait Pattern/deviations: Step-through pattern;Decreased stride length;Drifts right/left     General Gait Details: Slow, mostly steady gait with mild drifting noted; no LOB, no diploplia. Stumbles towards left with head turn but no overt LOB. More tired post walking/stairs.   Stairs Stairs: Yes Stairs assistance: Supervision Stair Management: One rail Right;Alternating pattern;Forwards Number of Stairs: 5 General stair comments: Cues for safety; no issues noted.   Wheelchair Mobility    Modified Rankin (Stroke Patients Only) Modified Rankin (Stroke Patients Only) Pre-Morbid Rankin Score: No significant disability Modified Rankin: Moderate disability     Balance Overall balance assessment: Needs assistance Sitting-balance support: Feet supported;No upper extremity supported Sitting balance-Leahy Scale: Good     Standing balance support: During functional activity Standing balance-Leahy Scale: Fair                              Cognition Arousal/Alertness: Lethargic;Awake/alert(awake upon arrival; lethargic towards end) Behavior During Therapy: WFL for tasks assessed/performed Overall Cognitive Status: No family/caregiver present to determine baseline cognitive functioning                                 General Comments: Cognition appears [facility] when he is awake.  Reports he falls asleep without warning. This problem has been going on for sometime but has worsened since being in the hospital. Towards end of session, eyes appeared more droopy and pt reporting "i  am ready to fall asleep now." Slow to respond at times and loses train of thought mid sentance on 2 occasions.      Exercises      General Comments General comments (skin integrity, edema, etc.): Sp02 >93%  on RA.      Pertinent Vitals/Pain Pain Assessment: No/denies pain    Home Living                      Prior Function            PT Goals (current goals can now be found in the care plan section) Progress towards PT goals: Progressing toward goals    Frequency    Min 3X/week      PT Plan Current plan remains appropriate    Co-evaluation              AM-PAC PT "6 Clicks" Mobility   Outcome Measure  Help needed turning from your back to your side while in a flat bed without using bedrails?: None Help needed moving from lying on your back to sitting on the side of a flat bed without using bedrails?: None Help needed moving to and from a bed to a chair (including a wheelchair)?: A Little Help needed standing up from a chair using your arms (e.g., wheelchair or bedside chair)?: A Little Help needed to walk in hospital room?: A Little Help needed climbing 3-5 steps with a railing? : A Little 6 Click Score: 20    End of Session Equipment Utilized During Treatment: Gait belt Activity Tolerance: Patient tolerated treatment well Patient left: in bed;with call bell/phone within reach;with bed alarm set Nurse Communication: Mobility status PT Visit Diagnosis: Unsteadiness on feet (R26.81);Hemiplegia and hemiparesis Hemiplegia - Right/Left: Left Hemiplegia - dominant/non-dominant: Non-dominant Hemiplegia - caused by: Unspecified     Time: [id number] PT Time Calculation (min) (ACUTE ONLY): 20 min  Charges:  $Gait Training: 8-22 mins                     Wray Kearns, PT, DPT Acute Rehabilitation Services Pager [phone] Office [phone]       [place] 02/01/2019, 11:47 AM

## 2019-02-02 ENCOUNTER — Other Ambulatory Visit: Payer: Medicare HMO

## 2019-02-04 ENCOUNTER — Ambulatory Visit
Admission: RE | Admit: 2019-02-04 | Discharge: 2019-02-04 | Disposition: A | Discharge status: Home / Self Care | Payer: Medicare HMO | Source: Ambulatory Visit | Attending: Interventional Radiology | Admitting: Interventional Radiology

## 2019-02-04 ENCOUNTER — Encounter: Payer: Self-pay | Admitting: *Deleted

## 2019-02-04 ENCOUNTER — Other Ambulatory Visit: Payer: Self-pay

## 2019-02-04 DIAGNOSIS — IMO0002 Reserved for concepts with insufficient information to code with codable children: Secondary | ICD-10-CM

## 2019-02-04 DIAGNOSIS — T82330A Leakage of aortic (bifurcation) graft (replacement), initial encounter: Secondary | ICD-10-CM

## 2019-02-04 DIAGNOSIS — I714 Abdominal aortic aneurysm, without rupture: Secondary | ICD-10-CM | POA: Diagnosis not present

## 2019-02-04 HISTORY — PX: IR RADIOLOGIST EVAL & MGMT: IMG5224

## 2019-02-04 NOTE — Progress Notes (Signed)
Chief Complaint: Patient was consulted remotely today (TeleHealth) for AAA with type 2 endoleak at the request of [facility].    Referring Physician(s): Ruta Hinds  History of Present Illness: Christian Mccall is a 63 y.o. male withhistory of cirrhosis, CAD, MI, CVA, COPD, HLD, HTN, DM, and AAA with type II endoleak and previous repair in November of 2016.    Surveillance imaging demonstrated recurrent type II endoleak caudal to the prior endovascular repair.Despite having a stable size in the aneurysm sac, there was concern for outpouching in the left posterolateral aspect of the sac in the region of the new endoleak.  He underwent a direct sac puncture with endoleak repair using liquid embolic on [id number].  He presents today for follow-up evaluation.    He was just recently admitted for work-up and evaluation of new onset diplopia and somnolence.  Unfortunately, they were unable to find a definitive cause for his diplopia.  His admitting physician feels that it may be secondary to chronic Plavix use.  He is no longer taking Plavix.  Other than this recent episode, he has been doing clinically well.  He denies back or flank pain, syncope, chest pain or shortness of breath.  Past Medical History:  Diagnosis Date   AAA (abdominal aortic aneurysm) ([place])    Anginal pain ([place])    Bulging lumbar disc    "& some in my neck"   C. difficile colitis    2008 OR 09   CAD (coronary artery disease)    Change in platelet count    pt states 'has been low since 2012; patient had to receive platelets in surgery in Nov 2016'   Chronic back pain    Cirrhosis ([place])    COPD (chronic obstructive pulmonary disease) ([place])    Diabetic peripheral neuropathy ([place])    Emphysema lung ([facility])    Enlarged liver    Enlarged prostate    GERD (gastroesophageal reflux disease)    Hardening of the arteries of the brain    Headache    "~ weekly" (04/12/2015)   Heart attack ([place])  Nov. 15,2016   TIA   History of colon polyps    Hyperlipidemia    Hypertension    Leg pain    with walking and pain in feet while lying flat   Memory loss    Due to hardening of the arteries in the brain   Myocardial infarction ([place])    "I've had probably 6-7" (04/12/2015)   Restless leg syndrome    Rheumatic fever    AS A CHILD  ?6 YRS OLD   RLS (restless legs syndrome)    Shortness of breath 12/06/10   while lying flat and with exertion   Snoring    Spleen enlarged    Stroke ([place]) 2004   denies residual on 04/12/2015   Thrombocytopenia ([place])    since 2012; can be as low as 54K.    TIA (transient ischemic attack) "several"   "since 2015" (04/12/2015)   Type II diabetes mellitus ([place]) dx'd 2009    Past Surgical History:  Procedure Laterality Date   ABDOMINAL AORTIC ANEURYSM REPAIR  01/16/2011   EVAR   ABDOMINAL AORTIC ANEURYSM REPAIR  04/12/2015   CARDIAC CATHETERIZATION  "several"   COLONOSCOPY     COLONOSCOPY W/ BIOPSIES     COLONOSCOPY WITH PROPOFOL N/A 03/21/2016   Procedure: COLONOSCOPY WITH PROPOFOL;  Surgeon: Arta Silence, MD;  Location: [facility] ENDOSCOPY;  Service: Endoscopy;  Laterality: N/A;  CORONARY ANGIOPLASTY WITH STENT PLACEMENT  "several"   > 4 sents   CORONARY ARTERY BYPASS GRAFT  02/18/2001   "CABG x 4"   Direct sac puncture with liquid embolic repair of Type II endoleak  04/12/2015; 03/05/2017   ESOPHAGOGASTRODUODENOSCOPY (EGD) WITH PROPOFOL N/A 03/21/2016   Procedure: ESOPHAGOGASTRODUODENOSCOPY (EGD) WITH PROPOFOL;  Surgeon: Arta Silence, MD;  Location: [facility];  Service: Endoscopy;  Laterality: N/A;   IR AORTAGRAM ABDOMINAL SERIALOGRAM  03/05/2017   IR CT SPINE LTD  03/05/2017   IR EMBO ARTERIAL NOT HEMORR HEMANG INC GUIDE ROADMAPPING  03/05/2017   IR RADIOLOGIST EVAL & MGMT  02/05/2017   IR RADIOLOGIST EVAL & MGMT  04/01/2017   IR RADIOLOGIST EVAL & MGMT  10/01/2017   LEFT HEART CATH AND CORS/GRAFTS ANGIOGRAPHY N/A  11/13/2018   Procedure: LEFT HEART CATH AND CORS/GRAFTS ANGIOGRAPHY;  Surgeon: Belva Crome, MD;  Location: [place];  Service: Cardiovascular;  Laterality: N/A;   PERIPHERAL VASCULAR CATHETERIZATION N/A 03/10/2015   Procedure: Abdominal Aortogram;  Surgeon: Elam Dutch, MD;  Location: [facility];  Service: Cardiovascular;  Laterality: N/A;   RADIOLOGY WITH ANESTHESIA N/A 04/12/2015   Procedure: embolization;  Surgeon: Jacqulynn Cadet, MD;  Location: [facility];  Service: Radiology;  Laterality: N/A;   RADIOLOGY WITH ANESTHESIA N/A 03/05/2017   Procedure: Type II Endoleak Repair;  Surgeon: Jacqulynn Cadet, MD;  Location: [facility] [place];  Service: Radiology;  Laterality: N/A;   SHOULDER SURGERY Right 07-27-2015   TONSILLECTOMY  ~ 1965    Allergies: Patient has no known allergies.  Medications: Prior to Admission medications   Medication Sig Start Date End Date Taking? Authorizing Provider  albuterol (PROVENTIL HFA;VENTOLIN HFA) 108 (90 BASE) MCG/ACT inhaler Inhale 2 puffs into the lungs every 6 (six) hours as needed for wheezing or shortness of breath.     [provider]  aspirin EC 81 MG tablet Take 1 tablet (81 mg total) by mouth daily. 03/22/16   Arta Silence, MD  Cholecalciferol (VITAMIN D) 2000 units tablet Take 2,000 Units by mouth 2 (two) times daily.     [provider]  clonazePAM (KLONOPIN) 1 MG tablet Take 1 mg by mouth 2 (two) times daily.     [provider]  cyanocobalamin (,VITAMIN B-12,) 1000 MCG/ML injection Inject 1,000 mcg into the muscle See admin instructions. Inject 1000 mcg intramuscularly once every 3 months    [provider]  empagliflozin (JARDIANCE) 10 MG TABS tablet Take 10 mg by mouth every morning.     [provider]  escitalopram (LEXAPRO) 20 MG tablet Take 20 mg by mouth at bedtime.    [provider]  ezetimibe-simvastatin (VYTORIN) 10-40 MG per tablet Take 1 tablet by mouth at bedtime.      [provider]  fenofibrate 160 MG tablet Take 160 mg by mouth every morning.  11/03/10   [provider]  Fluticasone-Salmeterol (ADVAIR DISKUS) 100-50 MCG/DOSE AEPB Inhale 1 puff into the lungs 2 (two) times daily.     [provider]  furosemide (LASIX) 40 MG tablet Take 40 mg by mouth every other day.     [provider]  glimepiride (AMARYL) 4 MG tablet Take 4 mg by mouth at bedtime.     [provider]  insulin glargine (LANTUS) 100 unit/mL SOPN Inject 20 Units into the skin at bedtime.    [provider]  lansoprazole (PREVACID) 30 MG capsule Take 30 mg by mouth every morning.  03/11/14   [provider]  metFORMIN (GLUCOPHAGE) 1000 MG tablet Take 1,000 mg by mouth daily with breakfast.     [provider]  metoprolol (LOPRESSOR) 50 MG tablet Take 50 mg by mouth 2 (two) times daily.     [provider]  nitroGLYCERIN (NITRODUR - DOSED IN MG/24 HR) 0.4 mg/hr patch Apply patch in the morning, remove at bedtime Patient taking differently: Place 0.4 mg onto the skin See admin instructions. Apply one patch (0.4 mg) to upper chest every  morning, remove at bedtime 12/07/18   Isaiah Serge, NP  nitroGLYCERIN (NITROSTAT) 0.4 MG SL tablet Place 1 tablet (0.4 mg total) under the tongue every 5 (five) minutes as needed for chest pain. 10/08/18   Belva Crome, MD  oxyCODONE-acetaminophen (PERCOCET) 5-325 MG tablet Take 1 tablet by mouth every 4 (four) hours as needed (max 6 q). Patient taking differently: Take 1 tablet by mouth at bedtime as needed (pain).  03/19/18   Shuford, Olivia Mackie, PA-C  OXYGEN Inhale into the lungs. 2L at night    [provider]  pramipexole (MIRAPEX) 0.25 MG tablet Take 0.25 mg by mouth at bedtime.  03/24/12   [provider]  pregabalin (LYRICA) 100 MG capsule Take 100-200 mg by mouth See admin instructions. Take 1 tablet (100 mg) by mouth every morning and 2 tablets (200 mg) at  bedtime    [provider]  ranolazine (RANEXA) 1000 MG SR tablet Take 1 tablet (1,000 mg total) by mouth 2 (two) times daily. 01/21/19   Belva Crome, MD  terazosin (HYTRIN) 1 MG capsule Take 1 mg by mouth at bedtime.     [provider]  tiotropium (SPIRIVA) 18 MCG inhalation capsule Place 18 mcg into inhaler and inhale every morning.     [provider]  verapamil (CALAN-SR) 120 MG CR tablet Take 1 tablet (120 mg total) by mouth daily. 02/02/19 03/04/19  Barb Merino, MD     Family History  Problem Relation Age of Onset   Aneurysm Mother        AAA   Heart disease Mother        Heart Disease before age 7   Other Mother        Carotid artery stenosis   Hyperlipidemia Mother    Hypertension Mother    Lupus Sister    Cancer Sister        Ovarian   Heart disease Sister    Heart disease Brother        Heart Disease before age 56   Hypertension Brother    Heart attack Brother    Heart disease Brother        Before age 13   Heart attack Brother    Heart attack Brother    Hyperlipidemia Father    Hypertension Father    Aneurysm Maternal Aunt        AAA   Other Maternal Aunt        AAA   Aneurysm Maternal Uncle        AAA   Other Maternal Uncle        AAA    Social History   Socioeconomic History   Marital status: Married    Spouse name: reba   Number of children: 2   Years of education: 9   Highest education level: Not on file  Occupational History   Occupation: disability  Social Designer, fashion/clothing strain: Not on file  Food insecurity    Worry: Not on file    Inability: Not on file   Transportation needs    Medical: Not on file    Non-medical: Not on file  Tobacco Use   Smoking status: Former Smoker    Packs/day: 0.00    Years: 30.00    Pack years: 0.00    Types: Cigarettes    Quit date: 02/17/2001    Years since quitting: 17.9   Smokeless tobacco: Never Used  Substance and Sexual  Activity   Alcohol use: Yes    Alcohol/week: 1.0 standard drinks    Types: 1 Cans of beer per week    Comment: occasional   Drug use: No   Sexual activity: Not Currently  Lifestyle   Physical activity    Days per week: Not on file    Minutes per session: Not on file   Stress: Not on file  Relationships   Social connections    Talks on phone: Not on file    Gets together: Not on file    Attends religious service: Not on file    Active member of club or organization: Not on file    Attends meetings of clubs or organizations: Not on file    Relationship status: Not on file  Other Topics Concern   Not on file  Social History Narrative   Patient lives at home with his wife.       Review of Systems  Review of Systems: A 12 point ROS discussed and pertinent positives are indicated in the HPI above.  All other systems are negative.  Physical Exam No direct physical exam was performed (except for noted visual exam findings with Video Visits).    Vital Signs: There were no vitals taken for this visit.  Imaging: Ct Angio Head W Or Mccall Contrast  Result Date: 01/30/2019 CLINICAL DATA:  Stroke, follow up. Diplopia. Left facial numbness. TIA. EXAM: CT ANGIOGRAPHY HEAD AND NECK TECHNIQUE: Multidetector CT imaging of the head and neck was performed using the standard protocol during bolus administration of intravenous contrast. Multiplanar CT image reconstructions and MIPs were obtained to evaluate the vascular anatomy. Carotid stenosis measurements (when applicable) are obtained utilizing NASCET criteria, using the distal internal carotid diameter as the denominator. CONTRAST:  22mL OMNIPAQUE IOHEXOL 350 MG/ML SOLN COMPARISON:  MRI brain 01/30/2019 FINDINGS: CT HEAD FINDINGS Brain: Mild atrophy and moderate diffuse white matter disease is again noted. Remote lacunar infarcts are present in the right caudate head. No acute hemorrhage or mass lesion is present. The ventricles are of  normal size. No significant extraaxial fluid collection is present. The brainstem and cerebellum are within normal limits. Vascular: Atherosclerotic changes are present within the cavernous internal carotid arteries bilaterally. There is no hyperdense vessel. Skull: Calvarium is intact. No focal lytic or blastic lesions are present. No significant extracranial soft tissue injury is present. Sinuses: The paranasal sinuses and mastoid air cells are clear. Orbits: The globes and orbits are within normal limits. Review of the MIP images confirms the above findings CTA NECK FINDINGS Aortic arch: A 3 vessel arch configuration is present. Atherosclerotic changes are present without significant stenosis at the great vessel origins or aneurysm. Right carotid system: The right common carotid artery demonstrates dense atherosclerotic calcifications in the anterior wall without a significant stenosis relative to the more distal vessel. The right carotid bifurcation demonstrate significant noncalcified plaque posteriorly. The lumen is narrowed to 1.8 mm. This compares with a more distal measurement of  3.8 mm. Additional stenoses are present. Cervical left ICA is otherwise normal. Left carotid system: The left common carotid artery demonstrates mild mural calcification without significant stenosis. Dense calcifications are present at the carotid bifurcation without a significant stenosis relative to the more distal vessel. The cervical left ICA is otherwise normal. Vertebral arteries: The vertebral arteries originate from the subclavian arteries bilaterally. There is calcification at the origin of both vertebral arteries. A 50% stenosis is present on the right. No significant stenosis is present on the left. The left vertebral artery is slightly dominant to the right. No tandem stenosis is present in either vertebral artery in the neck. Skeleton: Mild endplate degenerative changes are present. There is straightening and slight  reversal of the normal cervical lordosis. Degenerative anterolisthesis is present at C2-3. The patient is edentulous. No focal lytic or blastic lesions are present. Other neck: The soft tissues of the neck are otherwise unremarkable. There is mild heterogeneity of the thyroid without a dominant lesion. No significant adenopathy is present. Salivary glands are within normal limits. No focal mucosal or submucosal lesions are present. Upper chest: The lung apices are clear. No focal nodule, mass, or airspace disease is present. Review of the MIP images confirms the above findings CTA HEAD FINDINGS Anterior circulation: Atherosclerotic changes are present within the cavernous right ICA without a significant stenosis relative to the more distal vessel. Calcifications are also present in the cavernous left internal carotid artery. There is a 50% stenosis relative to the more distal vessel. ICA termini are within the normal limits bilaterally. The A1 and M1 segments are normal. The anterior communicating artery is patent. MCA bifurcations are intact. There is some attenuation of distal ACA and MCA branch vessels without a significant proximal stenosis or occlusion. Posterior circulation: The left vertebral artery is the dominant vessel. PICA origins are visualized and normal. The vertebrobasilar junction is normal. Basilar artery is normal. Both posterior cerebral arteries originate from basilar tip. There is some attenuation of distal PCA branch vessels without a significant proximal stenosis or occlusion. Venous sinuses: The dural sinuses are patent. Straight sinus deep cerebral veins are intact. Cortical veins are unremarkable. Anatomic variants: None Review of the MIP images confirms the above findings IMPRESSION: 1. Approximately 50% stenosis of the proximal right ICA secondary to prominent noncalcified plaque. 2. Atherosclerotic changes at the aortic arch and within the common carotid arteries bilaterally without  significant stenosis. 3. Atherosclerotic calcifications within the cavernous ICA bilaterally. A 50% stenosis is present on the left. 4. Mild distal disease without a significant proximal stenosis, aneurysm, or branch vessel occlusion within the circle-of-Willis. 5. 50% stenosis at the origin of the right vertebral artery. 6. Mild degenerative changes of the cervical spine. Electronically Signed   By: San Morelle M.D.   On: 01/30/2019 09:01   Ct Angio Neck W Or Mccall Contrast  Result Date: 01/30/2019 CLINICAL DATA:  Stroke, follow up. Diplopia. Left facial numbness. TIA. EXAM: CT ANGIOGRAPHY HEAD AND NECK TECHNIQUE: Multidetector CT imaging of the head and neck was performed using the standard protocol during bolus administration of intravenous contrast. Multiplanar CT image reconstructions and MIPs were obtained to evaluate the vascular anatomy. Carotid stenosis measurements (when applicable) are obtained utilizing NASCET criteria, using the distal internal carotid diameter as the denominator. CONTRAST:  26mL OMNIPAQUE IOHEXOL 350 MG/ML SOLN COMPARISON:  MRI brain 01/30/2019 FINDINGS: CT HEAD FINDINGS Brain: Mild atrophy and moderate diffuse white matter disease is again noted. Remote lacunar infarcts are present in the  right caudate head. No acute hemorrhage or mass lesion is present. The ventricles are of normal size. No significant extraaxial fluid collection is present. The brainstem and cerebellum are within normal limits. Vascular: Atherosclerotic changes are present within the cavernous internal carotid arteries bilaterally. There is no hyperdense vessel. Skull: Calvarium is intact. No focal lytic or blastic lesions are present. No significant extracranial soft tissue injury is present. Sinuses: The paranasal sinuses and mastoid air cells are clear. Orbits: The globes and orbits are within normal limits. Review of the MIP images confirms the above findings CTA NECK FINDINGS Aortic arch: A 3 vessel  arch configuration is present. Atherosclerotic changes are present without significant stenosis at the great vessel origins or aneurysm. Right carotid system: The right common carotid artery demonstrates dense atherosclerotic calcifications in the anterior wall without a significant stenosis relative to the more distal vessel. The right carotid bifurcation demonstrate significant noncalcified plaque posteriorly. The lumen is narrowed to 1.8 mm. This compares with a more distal measurement of 3.8 mm. Additional stenoses are present. Cervical left ICA is otherwise normal. Left carotid system: The left common carotid artery demonstrates mild mural calcification without significant stenosis. Dense calcifications are present at the carotid bifurcation without a significant stenosis relative to the more distal vessel. The cervical left ICA is otherwise normal. Vertebral arteries: The vertebral arteries originate from the subclavian arteries bilaterally. There is calcification at the origin of both vertebral arteries. A 50% stenosis is present on the right. No significant stenosis is present on the left. The left vertebral artery is slightly dominant to the right. No tandem stenosis is present in either vertebral artery in the neck. Skeleton: Mild endplate degenerative changes are present. There is straightening and slight reversal of the normal cervical lordosis. Degenerative anterolisthesis is present at C2-3. The patient is edentulous. No focal lytic or blastic lesions are present. Other neck: The soft tissues of the neck are otherwise unremarkable. There is mild heterogeneity of the thyroid without a dominant lesion. No significant adenopathy is present. Salivary glands are within normal limits. No focal mucosal or submucosal lesions are present. Upper chest: The lung apices are clear. No focal nodule, mass, or airspace disease is present. Review of the MIP images confirms the above findings CTA HEAD FINDINGS Anterior  circulation: Atherosclerotic changes are present within the cavernous right ICA without a significant stenosis relative to the more distal vessel. Calcifications are also present in the cavernous left internal carotid artery. There is a 50% stenosis relative to the more distal vessel. ICA termini are within the normal limits bilaterally. The A1 and M1 segments are normal. The anterior communicating artery is patent. MCA bifurcations are intact. There is some attenuation of distal ACA and MCA branch vessels without a significant proximal stenosis or occlusion. Posterior circulation: The left vertebral artery is the dominant vessel. PICA origins are visualized and normal. The vertebrobasilar junction is normal. Basilar artery is normal. Both posterior cerebral arteries originate from basilar tip. There is some attenuation of distal PCA branch vessels without a significant proximal stenosis or occlusion. Venous sinuses: The dural sinuses are patent. Straight sinus deep cerebral veins are intact. Cortical veins are unremarkable. Anatomic variants: None Review of the MIP images confirms the above findings IMPRESSION: 1. Approximately 50% stenosis of the proximal right ICA secondary to prominent noncalcified plaque. 2. Atherosclerotic changes at the aortic arch and within the common carotid arteries bilaterally without significant stenosis. 3. Atherosclerotic calcifications within the cavernous ICA bilaterally. A 50% stenosis is  present on the left. 4. Mild distal disease without a significant proximal stenosis, aneurysm, or branch vessel occlusion within the circle-of-Willis. 5. 50% stenosis at the origin of the right vertebral artery. 6. Mild degenerative changes of the cervical spine. Electronically Signed   By: San Morelle M.D.   On: 01/30/2019 09:01   Christian Mccall Contrast  Result Date: 01/30/2019 CLINICAL DATA:  Initial evaluation for acute diplopia, slight left-sided facial numbness and droop. Symptoms  now resolved. EXAM: MRI HEAD WITHOUT CONTRAST TECHNIQUE: Multiplanar, multiecho pulse sequences of the brain and surrounding structures were obtained without intravenous contrast. COMPARISON:  Prior brain MRI from 12/07/2013. FINDINGS: Brain: Cerebral volume within normal limits for age. Patchy and confluent T2/FLAIR hyperintensity within the periventricular and deep white matter both cerebral hemispheres most consistent with chronic microvascular ischemic disease, moderate nature, progressed from previous. Few small remote lacunar infarcts noted at the right caudate head. Single punctate 5 mm focus of minimally increased diffusion signal within the subcortical white matter of the posterior right frontal region (series 5, image 80). No discernible corresponding ADC correlate. While this finding could reflect artifact/T2 shine through, sequelae of late subacute small vessel ischemia would be difficult to exclude. No other diffusion abnormality to suggest acute or early subacute ischemia to explain patient's symptoms. Gray-white matter differentiation maintained. No encephalomalacia to suggest chronic cortical infarction. No foci of susceptibility artifact to suggest acute or chronic intracranial hemorrhage. No mass lesion, midline shift or mass effect. No hydrocephalus. No extra-axial fluid collection. Pituitary gland suprasellar region normal. Midline structures intact. Vascular: Major intracranial vascular flow voids are maintained. Skull and upper cervical spine: Craniocervical junction within normal limits. Upper cervical spine normal. Bone marrow signal intensity within normal limits. Scalp soft tissues within normal limits. Sinuses/Orbits: Globes and orbital soft tissues within normal limits. Paranasal sinuses are clear. Small left mastoid effusion, of doubtful significance. Inner ear structures grossly normal. Other: None. IMPRESSION: 1. No acute intracranial abnormality identified. 2. 5 mm focus of mildly  increased diffusion signal within the subcortical white matter of the posterior right frontal region, which could reflect a small focus of artifact/T2 shine through or possibly late subacute ischemia. Finding is not felt to be acute in nature. 3. Underlying moderate chronic microvascular ischemic disease with multiple remote lacunar infarcts involving the right caudate, progressed relative to 2015. Electronically Signed   By: Jeannine Boga M.D.   On: 01/30/2019 03:28   Christian Abdomen W Mccall Contrast  Result Date: 01/30/2019 CLINICAL DATA:  Liver lesions seen on previous ultrasound exam. EXAM: MRI ABDOMEN WITHOUT AND WITH CONTRAST TECHNIQUE: Multiplanar multisequence Christian imaging of the abdomen was performed both before and after the administration of intravenous contrast. CONTRAST:  9 cc Gadavist COMPARISON:  Ultrasound exam 10/13/2018 FINDINGS: Lower chest: Unremarkable. Hepatobiliary: Liver measures 22.8 cm craniocaudal length. Nodular liver contour consistent with cirrhosis. Tiny signal void in the subcapsular dome of liver is compatible with granuloma seen on previous CT. Arterial phase postcontrast imaging shows a subtle 10 mm focus of hyperenhancement in the inferior right liver (segment V on 73/13). This lesion shows no differential washout on later post-contrast imaging and has no enhancing rim. There is no evidence for gallstones, gallbladder wall thickening, or pericholecystic fluid. No intrahepatic or extrahepatic biliary dilation. Pancreas: No focal mass lesion. No dilatation of the main duct. No intraparenchymal cyst. No peripancreatic edema. Spleen:  Spleen measures 19.2 cm craniocaudal length, enlarged. Adrenals/Urinary Tract: No adrenal nodule or mass. Kidneys unremarkable. Stomach/Bowel: Stomach is unremarkable. No  gastric wall thickening. No evidence of outlet obstruction. Duodenum is normally positioned as is the ligament of Treitz. No small bowel or colonic dilatation within the visualized  abdomen. Vascular/Lymphatic: Abdominal aortic aneurysm noted, status post endograft. Portal vein, superior mesenteric vein, and splenic vein are patent. There is no gastrohepatic or hepatoduodenal ligament lymphadenopathy. No intraperitoneal or retroperitoneal lymphadenopathy. Other:  No intraperitoneal free fluid. Musculoskeletal: No abnormal marrow enhancement within the visualized bony anatomy. IMPRESSION: 1. Cirrhotic liver morphology. 2. 10 mm enhancing lesion identified inferior right liver without differential washout or rim enhancement. This is compatible with LI-RADS 3 category and follow-up MRI in 3-6 months recommended to re-evaluate. 3. Splenomegaly consistent with portal venous hypertension. Electronically Signed   By: Misty Stanley M.D.   On: 01/30/2019 20:43    Labs:  CBC: Recent Labs    11/10/18 1421 01/30/19 0415 01/31/19 1032 02/01/19 1132  WBC 7.6 4.2 4.5 4.1  HGB 15.4 14.2 14.7 14.3  HCT 44.6 41.6 43.3 42.1  PLT 100* 50* 63* 55*    COAGS: Recent Labs    01/30/19 0913  INR 1.2    BMP: Recent Labs    03/16/18 0930 11/10/18 1421 01/30/19 0415 02/01/19 1132  NA 132* 141 136 134*  K 4.4 4.6 3.5 4.0  CL 102 101 102 100  CO2 22 22 24 24   GLUCOSE 294* 163* 166* 203*  BUN 8 18 10 14   CALCIUM 9.2 10.2 8.6* 9.0  CREATININE 0.76 1.09 0.73 0.91  GFRNONAA >60 72 >60 >60  GFRAA >60 84 >60 >60    LIVER FUNCTION TESTS: Recent Labs    03/16/18 0930 02/01/19 1132  BILITOT 1.8* 1.2  AST 43* 31  ALT 40 35  ALKPHOS 61 55  PROT 7.0 6.1*  ALBUMIN 4.0 3.4*    TUMOR MARKERS: No results for input(s): AFPTM, CEA, CA199, CHROMGRNA in the last 8760 hours.  Assessment and Plan:  Overall, doing well with regard to his abdominal aortic aneurysm and type II endoleak.  He has undergone 2 prior endovascular endoleak repairs.  On his most recent prior imaging from April, and August 2020, he has a persistent visible type II endoleak at the caudal aspect of the aneurysm  sac.  However, the aneurysm sac size remains stable without significant change.  It appears that our prior repairs have reduced flow sufficiently to prevent wall tension and further growth, at least for the time being.  Continued close follow-up is recommended.  Additionally, his MRI detected a Li- RADS category 3 lesion in the liver which warrants follow-up.  Recommendation was already made for a follow-up MRI in 3-6 months.  I will be able to follow his aneurysm sac size on that examination.  1.)  We will have another teleconference after his follow-up MRI is completed in 3-6 months.  If he develops recurrent growth of the aneurysm sac, he may need a third endoleak repair.  I hope to avoid this.   Electronically Signed: Jacqulynn Cadet 02/04/2019, 12:40 PM   I spent a total of 15 Minutes in remote  clinical consultation, greater than 50% of which was counseling/coordinating care for AAA with type 2 endoleak.    Visit type: Audio only (telephone). Audio (no video) only due to patient request.. Alternative for in-person consultation at [facility], [address], [place], [place]. This visit type was conducted due to national recommendations for restrictions regarding the COVID-19 Pandemic (e.g. social distancing).  This format is felt to be most appropriate for this  patient at this time.  All issues noted in this document were discussed and addressed.

## 2019-02-08 DIAGNOSIS — I6990 Unspecified sequelae of unspecified cerebrovascular disease: Secondary | ICD-10-CM | POA: Diagnosis not present

## 2019-02-08 DIAGNOSIS — Z9119 Patient's noncompliance with other medical treatment and regimen: Secondary | ICD-10-CM | POA: Diagnosis not present

## 2019-02-08 DIAGNOSIS — K769 Liver disease, unspecified: Secondary | ICD-10-CM | POA: Diagnosis not present

## 2019-02-08 DIAGNOSIS — I739 Peripheral vascular disease, unspecified: Secondary | ICD-10-CM | POA: Diagnosis not present

## 2019-02-08 DIAGNOSIS — J449 Chronic obstructive pulmonary disease, unspecified: Secondary | ICD-10-CM | POA: Diagnosis not present

## 2019-02-08 DIAGNOSIS — I25708 Atherosclerosis of coronary artery bypass graft(s), unspecified, with other forms of angina pectoris: Secondary | ICD-10-CM | POA: Diagnosis not present

## 2019-02-08 DIAGNOSIS — I714 Abdominal aortic aneurysm, without rupture: Secondary | ICD-10-CM | POA: Diagnosis not present

## 2019-02-08 DIAGNOSIS — D696 Thrombocytopenia, unspecified: Secondary | ICD-10-CM | POA: Diagnosis not present

## 2019-02-08 DIAGNOSIS — I2510 Atherosclerotic heart disease of native coronary artery without angina pectoris: Secondary | ICD-10-CM | POA: Diagnosis not present

## 2019-02-08 DIAGNOSIS — Z23 Encounter for immunization: Secondary | ICD-10-CM | POA: Diagnosis not present

## 2019-02-08 DIAGNOSIS — Z794 Long term (current) use of insulin: Secondary | ICD-10-CM | POA: Diagnosis not present

## 2019-02-08 DIAGNOSIS — D519 Vitamin B12 deficiency anemia, unspecified: Secondary | ICD-10-CM | POA: Diagnosis not present

## 2019-02-08 DIAGNOSIS — E1151 Type 2 diabetes mellitus with diabetic peripheral angiopathy without gangrene: Secondary | ICD-10-CM | POA: Diagnosis not present

## 2019-02-08 DIAGNOSIS — K7460 Unspecified cirrhosis of liver: Secondary | ICD-10-CM | POA: Diagnosis not present

## 2019-02-08 NOTE — Progress Notes (Signed)
Virtual Visit via Video Note   This visit type was conducted due to national recommendations for restrictions regarding the COVID-19 Pandemic (e.g. social distancing) in an effort to limit this patient's exposure and mitigate transmission in our community.  Due to his co-morbid illnesses, this patient is at least at moderate risk for complications without adequate follow up.  This format is felt to be most appropriate for this patient at this time.  All issues noted in this document were discussed and addressed.  A limited physical exam was performed with this format.  Please refer to the patient's chart for his consent to telehealth for [facility].   Date:  02/09/2019   ID:  Christian Mccall, DOB January 15, 1956, MRN [id number]  Patient Location: Home Provider Location: Home  PCP:  Shon Baton, MD  Cardiologist:  Sinclair Grooms, MD  Electrophysiologist:  None   Evaluation Performed:  Follow-Up Visit  Chief Complaint:  CAD/CHF/Htn  History of Present Illness:    Christian Mccall is a 63 y.o. male with Coronary artery disease, coronary bypass grafting 2002, abdominal aortic aneurysm with endovascular repair and known endoleak 2012, COPD, type 2 diabetes, prior myocardial infarction, history of CVA, obstructive sleep apnea, essential hypertension, diplopia, and hyperlipidemia.  Was admitted with altered mental status. Unsure if stroke or complicated migraine. Event included speech abnormality, left eye would shut, and left jaw will droop. Prior to admission, he developed diplopia. Symptoms have not recurred since discharge, Diplopia was intermittent.  No recent chest pain or PND. No palpitations. No bleeding on anticoagulation.  The patient does not have symptoms concerning for COVID-19 infection (fever, chills, cough, or new shortness of breath).    Past Medical History:  Diagnosis Date   AAA (abdominal aortic aneurysm) ([place])    Anginal pain ([place])    Bulging lumbar disc    "& some  in my neck"   C. difficile colitis    2008 OR 09   CAD (coronary artery disease)    Change in platelet count    pt states 'has been low since 2012; patient had to receive platelets in surgery in Nov 2016'   Chronic back pain    Cirrhosis ([place])    COPD (chronic obstructive pulmonary disease) ([place])    Diabetic peripheral neuropathy ([place])    Emphysema lung ([facility])    Enlarged liver    Enlarged prostate    GERD (gastroesophageal reflux disease)    Hardening of the arteries of the brain    Headache    "~ weekly" (04/12/2015)   Heart attack ([place]) Nov. 15,2016   TIA   History of colon polyps    Hyperlipidemia    Hypertension    Leg pain    with walking and pain in feet while lying flat   Memory loss    Due to hardening of the arteries in the brain   Myocardial infarction ([place])    "I've had probably 6-7" (04/12/2015)   Restless leg syndrome    Rheumatic fever    AS A CHILD  ?6 YRS OLD   RLS (restless legs syndrome)    Shortness of breath 12/06/10   while lying flat and with exertion   Snoring    Spleen enlarged    Stroke ([place]) 2004   denies residual on 04/12/2015   Thrombocytopenia ([place])    since 2012; can be as low as 54K.    TIA (transient ischemic attack) "several"   "since 2015" (04/12/2015)  Type II diabetes mellitus ([place]) dx'd 2009   Past Surgical History:  Procedure Laterality Date   ABDOMINAL AORTIC ANEURYSM REPAIR  01/16/2011   EVAR   ABDOMINAL AORTIC ANEURYSM REPAIR  04/12/2015   CARDIAC CATHETERIZATION  "several"   COLONOSCOPY     COLONOSCOPY W/ BIOPSIES     COLONOSCOPY WITH PROPOFOL N/A 03/21/2016   Procedure: COLONOSCOPY WITH PROPOFOL;  Surgeon: Arta Silence, MD;  Location: [facility];  Service: Endoscopy;  Laterality: N/A;   CORONARY ANGIOPLASTY WITH STENT PLACEMENT  "several"   > 4 sents   CORONARY ARTERY BYPASS GRAFT  02/18/2001   "CABG x 4"   Direct sac puncture with liquid embolic repair of Type II endoleak   04/12/2015; 03/05/2017   ESOPHAGOGASTRODUODENOSCOPY (EGD) WITH PROPOFOL N/A 03/21/2016   Procedure: ESOPHAGOGASTRODUODENOSCOPY (EGD) WITH PROPOFOL;  Surgeon: Arta Silence, MD;  Location: [facility];  Service: Endoscopy;  Laterality: N/A;   IR AORTAGRAM ABDOMINAL SERIALOGRAM  03/05/2017   IR CT SPINE LTD  03/05/2017   IR EMBO ARTERIAL NOT HEMORR HEMANG INC GUIDE ROADMAPPING  03/05/2017   IR RADIOLOGIST EVAL & MGMT  02/05/2017   IR RADIOLOGIST EVAL & MGMT  04/01/2017   IR RADIOLOGIST EVAL & MGMT  10/01/2017   IR RADIOLOGIST EVAL & MGMT  02/04/2019   LEFT HEART CATH AND CORS/GRAFTS ANGIOGRAPHY N/A 11/13/2018   Procedure: LEFT HEART CATH AND CORS/GRAFTS ANGIOGRAPHY;  Surgeon: Belva Crome, MD;  Location: [facility];  Service: Cardiovascular;  Laterality: N/A;   PERIPHERAL VASCULAR CATHETERIZATION N/A 03/10/2015   Procedure: Abdominal Aortogram;  Surgeon: Elam Dutch, MD;  Location: [facility];  Service: Cardiovascular;  Laterality: N/A;   RADIOLOGY WITH ANESTHESIA N/A 04/12/2015   Procedure: embolization;  Surgeon: Jacqulynn Cadet, MD;  Location: [facility];  Service: Radiology;  Laterality: N/A;   RADIOLOGY WITH ANESTHESIA N/A 03/05/2017   Procedure: Type II Endoleak Repair;  Surgeon: Jacqulynn Cadet, MD;  Location: [facility];  Service: Radiology;  Laterality: N/A;   SHOULDER SURGERY Right 07-27-2015   TONSILLECTOMY  ~ 1965     Current Meds  Medication Sig   albuterol (PROVENTIL HFA;VENTOLIN HFA) 108 (90 BASE) MCG/ACT inhaler Inhale 2 puffs into the lungs every 6 (six) hours as needed for wheezing or shortness of breath.    aspirin EC 81 MG tablet Take 1 tablet (81 mg total) by mouth daily.   Cholecalciferol (VITAMIN D) 2000 units tablet Take 2,000 Units by mouth 2 (two) times daily.    clonazePAM (KLONOPIN) 1 MG tablet Take 1 mg by mouth 2 (two) times daily.    cyanocobalamin (,VITAMIN B-12,) 1000 MCG/ML injection Inject 1,000 mcg into the muscle See admin  instructions. Inject 1000 mcg intramuscularly once every 3 months   empagliflozin (JARDIANCE) 10 MG TABS tablet Take 10 mg by mouth every morning.    escitalopram (LEXAPRO) 20 MG tablet Take 20 mg by mouth at bedtime.   ezetimibe-simvastatin (VYTORIN) 10-40 MG per tablet Take 1 tablet by mouth at bedtime.    fenofibrate 160 MG tablet Take 160 mg by mouth every morning.    Fluticasone-Salmeterol (ADVAIR DISKUS) 100-50 MCG/DOSE AEPB Inhale 1 puff into the lungs 2 (two) times daily.    furosemide (LASIX) 40 MG tablet Take 40 mg by mouth every other day.    glimepiride (AMARYL) 4 MG tablet Take 4 mg by mouth at bedtime.    insulin glargine (LANTUS) 100 unit/mL SOPN Inject 20 Units into the skin at bedtime.   lansoprazole (PREVACID)  30 MG capsule Take 30 mg by mouth every morning.    losartan (COZAAR) 50 MG tablet Take 50 mg by mouth daily.   metFORMIN (GLUCOPHAGE) 1000 MG tablet Take 1,000 mg by mouth daily with breakfast.    metoprolol (LOPRESSOR) 50 MG tablet Take 50 mg by mouth 2 (two) times daily.    nitroGLYCERIN (NITRODUR - DOSED IN MG/24 HR) 0.4 mg/hr patch Apply patch in the morning, remove at bedtime (Patient taking differently: Place 0.4 mg onto the skin See admin instructions. Apply one patch (0.4 mg) to upper chest every  morning, remove at bedtime)   nitroGLYCERIN (NITROSTAT) 0.4 MG SL tablet Place 1 tablet (0.4 mg total) under the tongue every 5 (five) minutes as needed for chest pain.   oxyCODONE-acetaminophen (PERCOCET) 5-325 MG tablet Take 1 tablet by mouth every 4 (four) hours as needed (max 6 q). (Patient taking differently: Take 1 tablet by mouth at bedtime as needed (pain). )   OXYGEN Inhale into the lungs. 2L at night   pramipexole (MIRAPEX) 0.25 MG tablet Take 0.25 mg by mouth at bedtime.    pregabalin (LYRICA) 100 MG capsule Take 100-200 mg by mouth See admin instructions. Take 1 tablet (100 mg) by mouth every morning and 2 tablets (200 mg) at bedtime    ranolazine (RANEXA) 1000 MG SR tablet Take 1 tablet (1,000 mg total) by mouth 2 (two) times daily.   terazosin (HYTRIN) 1 MG capsule Take 1 mg by mouth at bedtime.    tiotropium (SPIRIVA) 18 MCG inhalation capsule Place 18 mcg into inhaler and inhale every morning.      Allergies:   Patient has no known allergies.   Social History   Tobacco Use   Smoking status: Former Smoker    Packs/day: 0.00    Years: 30.00    Pack years: 0.00    Types: Cigarettes    Quit date: 02/17/2001    Years since quitting: 17.9   Smokeless tobacco: Never Used  Substance Use Topics   Alcohol use: Yes    Alcohol/week: 1.0 standard drinks    Types: 1 Cans of beer per week    Comment: occasional   Drug use: No     Family Hx: The patient's family history includes Aneurysm in his maternal aunt, maternal uncle, and mother; Cancer in his sister; Heart attack in his brother, brother, and brother; Heart disease in his brother, brother, mother, and sister; Hyperlipidemia in his father and mother; Hypertension in his brother, father, and mother; Lupus in his sister; Other in his maternal aunt, maternal uncle, and mother.  ROS:   Please see the history of present illness.    The patient has had similar complaints to those present during the recent hospitalization off and on over the past 20 years.  The issue with left eye drooping and left face drooping is not new.  Diplopia was new.  Since discharge from the hospital, no recurrences have happened.  He is back to his baseline state.  They were instructed to stop losartan and start verapamil.  Verapamil was not a good idea given his other medication regimen. All other systems reviewed and are negative.   Prior CV studies:   The following studies were reviewed today:  ECHOCARDIOGRAM 01/30/2019: IMPRESSIONS    1. The left ventricle has normal systolic function with an ejection fraction of 60-65%. The cavity size was normal. There is mildly increased left  ventricular wall thickness. Left ventricular diastolic Doppler parameters are consistent with  pseudonormalization.  2. The right ventricle has normal systolic function. The cavity was normal. There is no increase in right ventricular wall thickness.  3. No evidence of mitral valve stenosis.  4. The tricuspid valve is grossly normal.  5. The aortic valve is grossly normal. No stenosis of the aortic valve.  6. The aorta is normal unless otherwise noted.  7. The aortic root and ascending aorta are normal in size and structure.  8. The atrial septum is grossly normal.  Labs/Other Tests and Data Reviewed:    EKG:  No ECG reviewed.  Recent Labs: 02/01/2019: ALT 35; BUN 14; Creatinine, Ser 0.91; Hemoglobin 14.3; Platelets 55; Potassium 4.0; Sodium 134   Recent Lipid Panel Lab Results  Component Value Date/Time   CHOL 103 01/30/2019 04:12 AM   TRIG 267 (H) 01/30/2019 04:12 AM   HDL 25 (L) 01/30/2019 04:12 AM   CHOLHDL 4.1 01/30/2019 04:12 AM   LDLCALC 25 01/30/2019 04:12 AM    Wt Readings from Last 3 Encounters:  02/09/19 214 lb (97.1 kg)  01/30/19 207 lb 7.3 oz (94.1 kg)  12/07/18 213 lb 1.9 oz (96.7 kg)     Objective:    Vital Signs:  BP (!) 148/84    Ht 5\' 11"  (1.803 m)    Wt 214 lb (97.1 kg)    BMI 29.85 kg/m    VITAL SIGNS:  reviewed  ASSESSMENT & PLAN:    1. Chest pain of uncertain etiology   2. Coronary artery disease involving native coronary artery of native heart with unstable angina pectoris ([place])   3. Essential (primary) hypertension   4. Abdominal aortic aneurysm (AAA) without rupture ([place])   5. Type 2 diabetes mellitus with complication, without long-term current use of insulin ([place])   6. Drug-induced low platelet count   7. Hyperlipidemia, unspecified hyperlipidemia type   8. Educated About Covid-19 Virus Infection    PLAN:  1. None. 2. Secondary prevention measures discussed as noted below. 3. Blood pressure is relatively high today.  They will continue  to monitor.  In hospital it was recommended that verapamil be added and that losartan be discontinued.  This is not a good idea given the patient's beta-blocker and the need for angiotensin receptor or angiotensin-converting enzyme inhibitor therapy in the setting of diabetes.  I have asked him not to start the verapamil. 4. Continue losartan and beta-blocker therapy to prevent recurrent aneurysm 5. Hemoglobin A1c should be less than 7 6. I agree with the recommendation to discontinue Plavix in the setting of low platelet count to decrease the risk of bleeding. 7. LDL target less than 70 8. Social distancing, handwashing, and masking is recommended.  Overall education and awareness concerning primary/secondary risk prevention was discussed in detail: LDL less than 70, hemoglobin A1c less than 7, blood pressure target less than 130/80 mmHg, >150 minutes of moderate aerobic activity per week, avoidance of smoking, weight control (via diet and exercise), and continued surveillance/management of/for obstructive sleep apnea.   COVID-19 Education: The signs and symptoms of COVID-19 were discussed with the patient and how to seek care for testing (follow up with PCP or arrange E-visit).  The importance of social distancing was discussed today.  Time:   Today, I have spent 18 minutes with the patient with telehealth technology discussing the above problems.     Medication Adjustments/Labs and Tests Ordered: Current medicines are reviewed at length with the patient today.  Concerns regarding medicines are outlined above.   Tests Ordered:  No orders of the defined types were placed in this encounter.   Medication Changes: No orders of the defined types were placed in this encounter.   Follow Up:  In Person in 6 month(s)  Signed, Sinclair Grooms, MD  02/09/2019 2:02 PM    [place]

## 2019-02-09 ENCOUNTER — Telehealth: Payer: Medicare HMO | Admitting: Interventional Cardiology

## 2019-02-09 ENCOUNTER — Other Ambulatory Visit: Payer: Self-pay

## 2019-02-09 ENCOUNTER — Encounter: Payer: Self-pay | Admitting: Interventional Cardiology

## 2019-02-09 ENCOUNTER — Telehealth (INDEPENDENT_AMBULATORY_CARE_PROVIDER_SITE_OTHER): Payer: Medicare HMO | Admitting: Interventional Cardiology

## 2019-02-09 VITALS — BP 148/84 | Ht 71.0 in | Wt 214.0 lb

## 2019-02-09 DIAGNOSIS — I10 Essential (primary) hypertension: Secondary | ICD-10-CM

## 2019-02-09 DIAGNOSIS — R079 Chest pain, unspecified: Secondary | ICD-10-CM

## 2019-02-09 DIAGNOSIS — R0789 Other chest pain: Secondary | ICD-10-CM

## 2019-02-09 DIAGNOSIS — I714 Abdominal aortic aneurysm, without rupture, unspecified: Secondary | ICD-10-CM

## 2019-02-09 DIAGNOSIS — D6959 Other secondary thrombocytopenia: Secondary | ICD-10-CM

## 2019-02-09 DIAGNOSIS — E785 Hyperlipidemia, unspecified: Secondary | ICD-10-CM

## 2019-02-09 DIAGNOSIS — I25110 Atherosclerotic heart disease of native coronary artery with unstable angina pectoris: Secondary | ICD-10-CM

## 2019-02-09 DIAGNOSIS — E118 Type 2 diabetes mellitus with unspecified complications: Secondary | ICD-10-CM

## 2019-02-09 DIAGNOSIS — Z7189 Other specified counseling: Secondary | ICD-10-CM

## 2019-02-09 NOTE — Patient Instructions (Signed)
Medication Instructions:  1) CONTINUE to stay OFF Plavix  If you need a refill on your cardiac medications before your next appointment, please call your pharmacy.   Lab work: None If you have labs (blood work) drawn today and your tests are completely normal, you will receive your results only by: [name] MyChart Message (if you have MyChart) OR . A paper copy in the mail If you have any lab test that is abnormal or we need to change your treatment, we will call you to review the results.  Testing/Procedures: None  Follow-Up: At [facility], you and your health needs are our priority.  As part of our continuing mission to provide you with exceptional heart care, we have created designated Provider Care Teams.  These Care Teams include your primary Cardiologist (physician) and Advanced Practice Providers (APPs -  Physician Assistants and Nurse Practitioners) who all work together to provide you with the care you need, when you need it. You will need a follow up appointment in 6 months.  Please call our office 2 months in advance to schedule this appointment.  You may see Sinclair Grooms, MD or one of the following Advanced Practice Providers on your designated Care Team:   Truitt Merle, NP Cecilie Kicks, NP . Kathyrn Drown, NP  Any Other Special Instructions Will Be Listed Below (If Applicable).

## 2019-02-18 ENCOUNTER — Ambulatory Visit: Payer: Medicare HMO | Admitting: Vascular Surgery

## 2019-03-03 ENCOUNTER — Other Ambulatory Visit: Payer: Self-pay

## 2019-03-03 ENCOUNTER — Ambulatory Visit (INDEPENDENT_AMBULATORY_CARE_PROVIDER_SITE_OTHER): Payer: Medicare HMO | Admitting: Neurology

## 2019-03-03 ENCOUNTER — Encounter: Payer: Self-pay | Admitting: Neurology

## 2019-03-03 VITALS — BP 136/79 | HR 64 | Temp 96.6°F | Wt 211.0 lb

## 2019-03-03 DIAGNOSIS — E1142 Type 2 diabetes mellitus with diabetic polyneuropathy: Secondary | ICD-10-CM | POA: Diagnosis not present

## 2019-03-03 DIAGNOSIS — G8194 Hemiplegia, unspecified affecting left nondominant side: Secondary | ICD-10-CM

## 2019-03-03 DIAGNOSIS — G43809 Other migraine, not intractable, without status migrainosus: Secondary | ICD-10-CM | POA: Diagnosis not present

## 2019-03-03 MED ORDER — DIVALPROEX SODIUM ER 500 MG PO TB24: 500.0000 mg | ORAL_TABLET | Freq: Every day | ORAL | 2 refills | Status: DC

## 2019-03-03 NOTE — Patient Instructions (Signed)
I had a long discussion with the patient and his wife regarding his recurrent stereotypic episodes of Left face and body weakness which have been occurring for the last 10 to 15 years in the differential includes hemiplegic migraine and simple partial seizures or TIA would be less likely.  I recommend a trial of Depakote ER 500 mg daily and if tolerated may consider increasing it further.  Continue aspirin 81 mg daily for stroke prevention and maintain aggressive risk factor modification with strict control of hypertension with blood pressure goal below 130/90, diabetes with hemoglobin A1c goal below 6.5% and lipids with LDL cholesterol goal below 70 mg percent.  He was also encouraged to eat a healthy diet and to be active and exercise regularly and not gain weight.  He will return for follow-up in the future in 2 months or call earlier if necessary.

## 2019-03-03 NOTE — Progress Notes (Signed)
[facility] [address] [place]. [place] [address] [phone]       OFFICE CONSULT NOTE  [name] Date of Birth:  1955/08/09 Medical Record Number:  [id number]   Referring MD: Rosalin Hawking Reason for Referral: TIA HPI: Christian Mccall is a pleasant 63 year old Caucasian male seen today for initial office consultation visit.  He is accompanied by his wife.  History is obtained from them and review of electronic medical records and I personally reviewed imaging films in PACS.  He has a past medical history of hypertension, hyperlipidemia and TIAs who presented to [facility] on 01/30/2019 with sudden onset of double vision and unsteady gait and left face and arm weakness which lasted 5 to 10 minutes.  He states his episodes are still typical and have been occurring off and on for the last 10 to 15 years.  He states that it begins with sensation of drooping of his left eye followed by left face and then left arm and leg the whole episode evolves over a minute or so and last for about 10 minutes usually but may have lasted up to an hour and a few occasions.  He feels tired after the episode and needs to rest.  No specific trigger for these episodes and they occur at variable random frequency.  He is able to sit down when the episode is beginning and not hurting himself.  He feels if he is not sitting he will not be able to walk.  He usually waits for the episode to finish.  He does complain of mild headache during the episode but it is not severe or bothersome.  He feels at times that his brain is twisting in his head when the episode is occurring.  Patient in fact was evaluated by me in 2013 for these episodes which are felt to be small vessel disease TIAs.  He was subsequently lost to follow-up.  Patient now informs me today that his mother has also had similar episodes which are recurrent and stereotypical.  Patient denies any prior known history of migraine headaches or  recurrent severe headaches which are disabling accompanying when light sound sensitivity.  During the last episode in August 2020 had MRI scan of the brain which showed no acute abnormality but did show a few remote his lacunar infarcts and moderate changes of chronic small vessel disease.  Transthoracic echo was unremarkable.  EEG was obtained which showed no epileptiform activity.  CT angiogram of the brain and neck showed 50% stenosis of bilateral internal carotid arteries and 50% stenosis at the origin of the right vertebral artery.  LDL cholesterol was low at 25 mg percent and hemoglobin A1c was elevated at 8.8.  Urine drug screen was negative.  Patient was started on dual antiplatelet therapy initially but because of thrombocytopenia and low platelet count of 50,000 advised to take aspirin alone and stop Plavix.  Dr. Erlinda Hong who evaluated the patient in August felt episodes could be recurrent hemiplegic migraine and suggested a trial of verapamil but the patient has not taken that.  ROS:   14 system review of systems is positive for  Weakness,headache, face and eye drooping and all other systems negative  PMH:  Past Medical History:  Diagnosis Date   AAA (abdominal aortic aneurysm) ([place])    Anginal pain ([place])    Bulging lumbar disc    "& some in my neck"   C. difficile colitis    2008 OR 09  CAD (coronary artery disease)    Change in platelet count    pt states 'has been low since 2012; patient had to receive platelets in surgery in Nov 2016'   Chronic back pain    Cirrhosis ([place])    COPD (chronic obstructive pulmonary disease) ([place])    Diabetic peripheral neuropathy ([place])    Emphysema lung ([facility])    Enlarged liver    Enlarged prostate    GERD (gastroesophageal reflux disease)    Hardening of the arteries of the brain    Headache    "~ weekly" (04/12/2015)   Heart attack ([place]) Nov. 15,2016   TIA   History of colon polyps    Hyperlipidemia    Hypertension    Leg  pain    with walking and pain in feet while lying flat   Memory loss    Due to hardening of the arteries in the brain   Myocardial infarction ([place])    "I've had probably 6-7" (04/12/2015)   Restless leg syndrome    Rheumatic fever    AS A CHILD  ?6 YRS OLD   RLS (restless legs syndrome)    Shortness of breath 12/06/10   while lying flat and with exertion   Snoring    Spleen enlarged    Stroke ([place]) 2004   denies residual on 04/12/2015   Thrombocytopenia ([place])    since 2012; can be as low as 54K.    TIA (transient ischemic attack) "several"   "since 2015" (04/12/2015)   Type II diabetes mellitus ([place]) dx'd 2009    Social History:  Social History   Socioeconomic History   Marital status: Married    Spouse name: reba   Number of children: 2   Years of education: 9   Highest education level: Not on file  Occupational History   Occupation: disability  Social Designer, fashion/clothing strain: Not on file   Food insecurity    Worry: Not on file    Inability: Not on file   Transportation needs    Medical: Not on file    Non-medical: Not on file  Tobacco Use   Smoking status: Former Smoker    Packs/day: 0.00    Years: 30.00    Pack years: 0.00    Types: Cigarettes    Quit date: 02/17/2001    Years since quitting: 18.0   Smokeless tobacco: Never Used  Substance and Sexual Activity   Alcohol use: Yes    Alcohol/week: 1.0 standard drinks    Types: 1 Cans of beer per week    Comment: occasional   Drug use: No   Sexual activity: Not Currently  Lifestyle   Physical activity    Days per week: Not on file    Minutes per session: Not on file   Stress: Not on file  Relationships   Social connections    Talks on phone: Not on file    Gets together: Not on file    Attends religious service: Not on file    Active member of club or organization: Not on file    Attends meetings of clubs or organizations: Not on file    Relationship status: Not on  file   Intimate partner violence    Fear of current or ex partner: Not on file    Emotionally abused: Not on file    Physically abused: Not on file    Forced sexual activity: Not on file  Other Topics Concern  Not on file  Social History Narrative   Patient lives at home with his wife.     Medications:   Current Outpatient Medications on File Prior to Visit  Medication Sig Dispense Refill   albuterol (PROVENTIL HFA;VENTOLIN HFA) 108 (90 BASE) MCG/ACT inhaler Inhale 2 puffs into the lungs every 6 (six) hours as needed for wheezing or shortness of breath.      aspirin EC 81 MG tablet Take 1 tablet (81 mg total) by mouth daily.     Cholecalciferol (VITAMIN D) 2000 units tablet Take 2,000 Units by mouth 2 (two) times daily.      clonazePAM (KLONOPIN) 1 MG tablet Take 1 mg by mouth 2 (two) times daily.      cyanocobalamin (,VITAMIN B-12,) 1000 MCG/ML injection Inject 1,000 mcg into the muscle See admin instructions. Inject 1000 mcg intramuscularly once every 3 months     empagliflozin (JARDIANCE) 10 MG TABS tablet Take 10 mg by mouth every morning.      escitalopram (LEXAPRO) 20 MG tablet Take 20 mg by mouth at bedtime.     ezetimibe-simvastatin (VYTORIN) 10-40 MG per tablet Take 1 tablet by mouth at bedtime.      fenofibrate 160 MG tablet Take 160 mg by mouth every morning.      Fluticasone-Salmeterol (ADVAIR DISKUS) 100-50 MCG/DOSE AEPB Inhale 1 puff into the lungs 2 (two) times daily.      furosemide (LASIX) 40 MG tablet Take 40 mg by mouth every other day.      glimepiride (AMARYL) 4 MG tablet Take 4 mg by mouth at bedtime.      insulin glargine (LANTUS) 100 unit/mL SOPN Inject 20 Units into the skin at bedtime.     lansoprazole (PREVACID) 30 MG capsule Take 30 mg by mouth every morning.   3   losartan (COZAAR) 50 MG tablet Take 50 mg by mouth daily.     metFORMIN (GLUCOPHAGE) 1000 MG tablet Take 1,000 mg by mouth daily with breakfast.      metoprolol (LOPRESSOR) 50  MG tablet Take 50 mg by mouth 2 (two) times daily.      nitroGLYCERIN (NITRODUR - DOSED IN MG/24 HR) 0.4 mg/hr patch Apply patch in the morning, remove at bedtime (Patient taking differently: Place 0.4 mg onto the skin See admin instructions. Apply one patch (0.4 mg) to upper chest every  morning, remove at bedtime) 30 patch 12   nitroGLYCERIN (NITROSTAT) 0.4 MG SL tablet Place 1 tablet (0.4 mg total) under the tongue every 5 (five) minutes as needed for chest pain. 25 tablet 3   oxyCODONE-acetaminophen (PERCOCET) 5-325 MG tablet Take 1 tablet by mouth every 4 (four) hours as needed (max 6 q). (Patient taking differently: Take 1 tablet by mouth at bedtime as needed (pain). ) 30 tablet 0   OXYGEN Inhale into the lungs. 2L at night     pramipexole (MIRAPEX) 0.25 MG tablet Take 0.25 mg by mouth at bedtime.      pregabalin (LYRICA) 100 MG capsule Take 100-200 mg by mouth See admin instructions. Take 1 tablet (100 mg) by mouth every morning and 2 tablets (200 mg) at bedtime     ranolazine (RANEXA) 1000 MG SR tablet Take 1 tablet (1,000 mg total) by mouth 2 (two) times daily. 180 tablet 3   terazosin (HYTRIN) 1 MG capsule Take 1 mg by mouth at bedtime.      tiotropium (SPIRIVA) 18 MCG inhalation capsule Place 18 mcg into inhaler and inhale every morning.  No current facility-administered medications on file prior to visit.     Allergies:  No Known Allergies  Physical Exam General: Mildly obese middle-aged Caucasian male, seated, in no evident distress Head: head normocephalic and atraumatic.   Neck: supple with no carotid or supraclavicular bruits Cardiovascular: regular rate and rhythm, no murmurs Musculoskeletal: no deformity Skin:  no rash/petichiae Vascular:  Normal pulses all extremities  Neurologic Exam Mental Status: Awake and fully alert. Oriented to place and time. Recent and remote memory intact. Attention span, concentration and fund of knowledge appropriate. Mood and  affect appropriate.  Cranial Nerves: Fundoscopic exam reveals sharp disc margins. Pupils equal, briskly reactive to light. Extraocular movements full without nystagmus. Visual fields full to confrontation. Hearing intact. Facial sensation intact. Face, tongue, palate moves normally and symmetrically.  Motor: Normal bulk and tone. Normal strength in all tested extremity muscles. Sensory.: intact to touch , pinprick , position   sensation.  Diminished vibration over toes bilaterally.  Positive Romberg sign. Coordination: Rapid alternating movements normal in all extremities. Finger-to-nose and heel-to-shin performed accurately bilaterally.  Unsteady while standing on either foot unsupported. Gait and Station: Arises from chair without difficulty. Stance is broad-based l. Gait demonstrates normal stride length and mild  imbalance . Able to heel, toe and tandem walk with great difficulty.  Reflexes: 1+ and symmetric. Toes downgoing.   NIHSS  0 Modified Rankin  1   ASSESSMENT: 63 year old Caucasian male with recurrent transient episodes of left eye, face and body weakness possibly episodes of complicated migraine.  Doubt small vessel disease TIAs given the recurrent and stroke typical nature.  However he does have significant vascular risk factors of coronary artery disease status post stenting and surgery, hypertension, hyperlipidemia and diabetes.  and silent infarcts on MRI.  He also has mild underlying diabetic sensory polyneuropathy     PLAN: I had a long discussion with the patient and his wife regarding his recurrent stereotypic episodes of Left face and body weakness which have been occurring for the last 10 to 15 years in the differential includes hemiplegic migraine and simple partial seizures or TIA would be less likely.  I recommend a trial of Depakote ER 500 mg daily and if tolerated may consider increasing it further.  Continue aspirin 81 mg daily for stroke prevention and maintain  aggressive risk factor modification with strict control of hypertension with blood pressure goal below 130/90, diabetes with hemoglobin A1c goal below 6.5% and lipids with LDL cholesterol goal below 70 mg percent.  He was also encouraged to eat a healthy diet and to be active and exercise regularly and not gain weight.  Greater than 50% time during this 45-minute consultation visit was spent on counseling and coordination of care about his recurrent stroke typical episodes and discussion about TIA versus complicated migraine and answering questions he will return for follow-up in the future in 2 months or call earlier if necessary. Antony Contras, MD  [facility] [address] [place], [place] [address]  Phone [phone] Fax [phone]  Note: This document was prepared with digital dictation and possible smart phrase technology. Any transcriptional errors that result from this process are unintentional.

## 2019-03-10 DIAGNOSIS — J449 Chronic obstructive pulmonary disease, unspecified: Secondary | ICD-10-CM | POA: Diagnosis not present

## 2019-03-17 ENCOUNTER — Ambulatory Visit: Payer: Medicare HMO | Admitting: Neurology

## 2019-03-17 ENCOUNTER — Other Ambulatory Visit: Payer: Self-pay

## 2019-03-17 DIAGNOSIS — R2990 Unspecified symptoms and signs involving the nervous system: Secondary | ICD-10-CM

## 2019-03-17 DIAGNOSIS — G43809 Other migraine, not intractable, without status migrainosus: Secondary | ICD-10-CM

## 2019-04-10 DIAGNOSIS — J449 Chronic obstructive pulmonary disease, unspecified: Secondary | ICD-10-CM | POA: Diagnosis not present

## 2019-04-15 DIAGNOSIS — Z7951 Long term (current) use of inhaled steroids: Secondary | ICD-10-CM | POA: Diagnosis not present

## 2019-04-15 DIAGNOSIS — I10 Essential (primary) hypertension: Secondary | ICD-10-CM | POA: Diagnosis not present

## 2019-04-15 DIAGNOSIS — W208XXA Other cause of strike by thrown, projected or falling object, initial encounter: Secondary | ICD-10-CM | POA: Diagnosis not present

## 2019-04-15 DIAGNOSIS — Z79899 Other long term (current) drug therapy: Secondary | ICD-10-CM | POA: Diagnosis not present

## 2019-04-15 DIAGNOSIS — S0181XA Laceration without foreign body of other part of head, initial encounter: Secondary | ICD-10-CM | POA: Diagnosis not present

## 2019-04-15 DIAGNOSIS — Z794 Long term (current) use of insulin: Secondary | ICD-10-CM | POA: Diagnosis not present

## 2019-04-15 DIAGNOSIS — S01112A Laceration without foreign body of left eyelid and periocular area, initial encounter: Secondary | ICD-10-CM | POA: Diagnosis not present

## 2019-04-15 DIAGNOSIS — E119 Type 2 diabetes mellitus without complications: Secondary | ICD-10-CM | POA: Diagnosis not present

## 2019-04-20 DIAGNOSIS — Z125 Encounter for screening for malignant neoplasm of prostate: Secondary | ICD-10-CM | POA: Diagnosis not present

## 2019-04-20 DIAGNOSIS — D519 Vitamin B12 deficiency anemia, unspecified: Secondary | ICD-10-CM | POA: Diagnosis not present

## 2019-04-20 DIAGNOSIS — E7849 Other hyperlipidemia: Secondary | ICD-10-CM | POA: Diagnosis not present

## 2019-04-20 DIAGNOSIS — E1151 Type 2 diabetes mellitus with diabetic peripheral angiopathy without gangrene: Secondary | ICD-10-CM | POA: Diagnosis not present

## 2019-04-27 DIAGNOSIS — E1151 Type 2 diabetes mellitus with diabetic peripheral angiopathy without gangrene: Secondary | ICD-10-CM | POA: Diagnosis not present

## 2019-04-27 DIAGNOSIS — J449 Chronic obstructive pulmonary disease, unspecified: Secondary | ICD-10-CM | POA: Diagnosis not present

## 2019-04-27 DIAGNOSIS — H532 Diplopia: Secondary | ICD-10-CM | POA: Diagnosis not present

## 2019-04-27 DIAGNOSIS — Z0000 Encounter for general adult medical examination without abnormal findings: Secondary | ICD-10-CM | POA: Diagnosis not present

## 2019-04-27 DIAGNOSIS — D692 Other nonthrombocytopenic purpura: Secondary | ICD-10-CM | POA: Diagnosis not present

## 2019-04-27 DIAGNOSIS — E1140 Type 2 diabetes mellitus with diabetic neuropathy, unspecified: Secondary | ICD-10-CM | POA: Diagnosis not present

## 2019-04-27 DIAGNOSIS — I209 Angina pectoris, unspecified: Secondary | ICD-10-CM | POA: Diagnosis not present

## 2019-04-27 DIAGNOSIS — K7469 Other cirrhosis of liver: Secondary | ICD-10-CM | POA: Diagnosis not present

## 2019-04-27 DIAGNOSIS — R400 Somnolence: Secondary | ICD-10-CM | POA: Diagnosis not present

## 2019-04-27 DIAGNOSIS — K769 Liver disease, unspecified: Secondary | ICD-10-CM | POA: Diagnosis not present

## 2019-04-27 DIAGNOSIS — D696 Thrombocytopenia, unspecified: Secondary | ICD-10-CM | POA: Diagnosis not present

## 2019-04-27 DIAGNOSIS — I739 Peripheral vascular disease, unspecified: Secondary | ICD-10-CM | POA: Diagnosis not present

## 2019-04-27 DIAGNOSIS — G43909 Migraine, unspecified, not intractable, without status migrainosus: Secondary | ICD-10-CM | POA: Diagnosis not present

## 2019-04-27 DIAGNOSIS — F1120 Opioid dependence, uncomplicated: Secondary | ICD-10-CM | POA: Diagnosis not present

## 2019-04-27 DIAGNOSIS — Z794 Long term (current) use of insulin: Secondary | ICD-10-CM | POA: Diagnosis not present

## 2019-04-27 DIAGNOSIS — M1990 Unspecified osteoarthritis, unspecified site: Secondary | ICD-10-CM | POA: Diagnosis not present

## 2019-04-27 DIAGNOSIS — E1139 Type 2 diabetes mellitus with other diabetic ophthalmic complication: Secondary | ICD-10-CM | POA: Diagnosis not present

## 2019-04-29 DIAGNOSIS — E1151 Type 2 diabetes mellitus with diabetic peripheral angiopathy without gangrene: Secondary | ICD-10-CM | POA: Diagnosis not present

## 2019-04-29 DIAGNOSIS — R82998 Other abnormal findings in urine: Secondary | ICD-10-CM | POA: Diagnosis not present

## 2019-05-10 ENCOUNTER — Encounter: Payer: Self-pay | Admitting: Neurology

## 2019-05-10 ENCOUNTER — Ambulatory Visit (INDEPENDENT_AMBULATORY_CARE_PROVIDER_SITE_OTHER): Payer: Medicare HMO | Admitting: Neurology

## 2019-05-10 ENCOUNTER — Other Ambulatory Visit: Payer: Self-pay

## 2019-05-10 VITALS — BP 122/71 | HR 54 | Temp 97.3°F | Wt 217.0 lb

## 2019-05-10 DIAGNOSIS — G43919 Migraine, unspecified, intractable, without status migrainosus: Secondary | ICD-10-CM

## 2019-05-10 DIAGNOSIS — J449 Chronic obstructive pulmonary disease, unspecified: Secondary | ICD-10-CM | POA: Diagnosis not present

## 2019-05-10 MED ORDER — BUTALBITAL-APAP-CAFFEINE 50-325-40 MG PO TABS: 1.0000 | ORAL_TABLET | Freq: Four times a day (QID) | ORAL | 0 refills | Status: DC | PRN

## 2019-05-10 MED ORDER — DIVALPROEX SODIUM ER 500 MG PO TB24: 500.0000 mg | ORAL_TABLET | Freq: Two times a day (BID) | ORAL | 2 refills | Status: DC

## 2019-05-10 NOTE — Patient Instructions (Signed)
I had a long discussion with the patient regarding his episodes of refractory migraine headaches and complicated migraine episodes and answered questions.  He seems to be tolerating Depakote well but is still having frequent headaches hence I recommend increasing the dose to Depakote ER 500 mg twice daily and to use Fioricet for symptomatic relief but to limit to not more than 2 doses per day and not more than 2 days/week.  He will return for follow-up in the future in 3 months with my nurse practitioner Janett Billow or call earlier if necessary.

## 2019-05-10 NOTE — Progress Notes (Signed)
[facility] [address] [place]. [place] [address] [phone]       OFFICE CONSULT NOTE  [name] Date of Birth:  Feb 27, 1956 Medical Record Number:  [id number]   Referring MD: Rosalin Hawking Reason for Referral: TIA [id number] visit 03/03/2019 ; [name] is a pleasant 63 year old Caucasian male seen today for initial office consultation visit.  He is accompanied by his wife.  History is obtained from them and review of electronic medical records and I personally reviewed imaging films in PACS.  He has a past medical history of hypertension, hyperlipidemia and TIAs who presented to [facility] on 01/30/2019 with sudden onset of double vision and unsteady gait and left face and arm weakness which lasted 5 to 10 minutes.  He states his episodes are still typical and have been occurring off and on for the last 10 to 15 years.  He states that it begins with sensation of drooping of his left eye followed by left face and then left arm and leg the whole episode evolves over a minute or so and last for about 10 minutes usually but may have lasted up to an hour and a few occasions.  He feels tired after the episode and needs to rest.  No specific trigger for these episodes and they occur at variable random frequency.  He is able to sit down when the episode is beginning and not hurting himself.  He feels if he is not sitting he will not be able to walk.  He usually waits for the episode to finish.  He does complain of mild headache during the episode but it is not severe or bothersome.  He feels at times that his brain is twisting in his head when the episode is occurring.  Patient in fact was evaluated by me in 2013 for these episodes which are felt to be small vessel disease TIAs.  He was subsequently lost to follow-up.  Patient now informs me today that his mother has also had similar episodes which are recurrent and stereotypical.  Patient denies any prior known history of  migraine headaches or recurrent severe headaches which are disabling accompanying when light sound sensitivity.  During the last episode in August 2020 had MRI scan of the brain which showed no acute abnormality but did show a few remote his lacunar infarcts and moderate changes of chronic small vessel disease.  Transthoracic echo was unremarkable.  EEG was obtained which showed no epileptiform activity.  CT angiogram of the brain and neck showed 50% stenosis of bilateral internal carotid arteries and 50% stenosis at the origin of the right vertebral artery.  LDL cholesterol was low at 25 mg percent and hemoglobin A1c was elevated at 8.8.  Urine drug screen was negative.  Patient was started on dual antiplatelet therapy initially but because of thrombocytopenia and low platelet count of 50,000 advised to take aspirin alone and stop Plavix.  Dr. Erlinda Hong who evaluated the patient in August felt episodes could be recurrent hemiplegic migraine and suggested a trial of verapamil but the patient has not taken that. Update 05/10/2019 ; he returns for follow-up after last visit 2 months ago.  He is accompanied by his wife.  He states he has not had any further episodes of complicated migraine in the form of vision difficulties, unsteady gait and left-sided weakness and numbness.  He however continues to have almost daily headaches.  The headaches can occur once or twice a day.  They are  moderate to severe and often disabling he has to lie down.  Is not taking any symptomatic medications.  Is taking Depakote ER 5 mg at night is tolerating it well without any side effects.  He is unable to need for specific triggers.  He has had no new health problems.  He has no other new complaints. ROS:   14 system review of systems is positive for  ,headache, face and eye drooping and all other systems negative  PMH:  Past Medical History:  Diagnosis Date   AAA (abdominal aortic aneurysm) (HCC)    Anginal pain ([place])    Bulging  lumbar disc    "& some in my neck"   C. difficile colitis    2008 OR 09   CAD (coronary artery disease)    Change in platelet count    pt states 'has been low since 2012; patient had to receive platelets in surgery in Nov 2016'   Chronic back pain    Cirrhosis ([place])    COPD (chronic obstructive pulmonary disease) ([place])    Diabetic peripheral neuropathy ([place])    Emphysema lung (HCC)    Enlarged liver    Enlarged prostate    GERD (gastroesophageal reflux disease)    Hardening of the arteries of the brain    Headache    "~ weekly" (04/12/2015)   Heart attack ([place]) Nov. 15,2016   TIA   History of colon polyps    Hyperlipidemia    Hypertension    Leg pain    with walking and pain in feet while lying flat   Memory loss    Due to hardening of the arteries in the brain   Myocardial infarction ([place])    "I've had probably 6-7" (04/12/2015)   Restless leg syndrome    Rheumatic fever    AS A CHILD  ?6 YRS OLD   RLS (restless legs syndrome)    Shortness of breath 12/06/10   while lying flat and with exertion   Snoring    Spleen enlarged    Stroke ([place]) 2004   denies residual on 04/12/2015   Thrombocytopenia ([place])    since 2012; can be as low as 54K.    TIA (transient ischemic attack) "several"   "since 2015" (04/12/2015)   Type II diabetes mellitus ([place]) dx'd 2009    Social History:  Social History   Socioeconomic History   Marital status: Married    Spouse name: Christian Mccall   Number of children: 2   Years of education: 9   Highest education level: Not on file  Occupational History   Occupation: disability  Social Designer, fashion/clothing strain: Not on file   Food insecurity    Worry: Not on file    Inability: Not on file   Transportation needs    Medical: Not on file    Non-medical: Not on file  Tobacco Use   Smoking status: Former Smoker    Packs/day: 0.00    Years: 30.00    Pack years: 0.00    Types: Cigarettes    Quit date:  02/17/2001    Years since quitting: 18.2   Smokeless tobacco: Never Used  Substance and Sexual Activity   Alcohol use: Yes    Alcohol/week: 1.0 standard drinks    Types: 1 Cans of beer per week    Comment: occasional   Drug use: No   Sexual activity: Not Currently  Lifestyle   Physical activity    Days per  week: Not on file    Minutes per session: Not on file   Stress: Not on file  Relationships   Social connections    Talks on phone: Not on file    Gets together: Not on file    Attends religious service: Not on file    Active member of club or organization: Not on file    Attends meetings of clubs or organizations: Not on file    Relationship status: Not on file   Intimate partner violence    Fear of current or ex partner: Not on file    Emotionally abused: Not on file    Physically abused: Not on file    Forced sexual activity: Not on file  Other Topics Concern   Not on file  Social History Narrative   Patient lives at home with his wife.     Medications:   Current Outpatient Medications on File Prior to Visit  Medication Sig Dispense Refill   albuterol (PROVENTIL HFA;VENTOLIN HFA) 108 (90 BASE) MCG/ACT inhaler Inhale 2 puffs into the lungs every 6 (six) hours as needed for wheezing or shortness of breath.      aspirin EC 81 MG tablet Take 1 tablet (81 mg total) by mouth daily.     Cholecalciferol (VITAMIN D) 2000 units tablet Take 2,000 Units by mouth 2 (two) times daily.      clonazePAM (KLONOPIN) 1 MG tablet Take 1 mg by mouth 2 (two) times daily.      cyanocobalamin (,VITAMIN B-12,) 1000 MCG/ML injection Inject 1,000 mcg into the muscle See admin instructions. Inject 1000 mcg intramuscularly once every 3 months     empagliflozin (JARDIANCE) 10 MG TABS tablet Take 10 mg by mouth every morning.      escitalopram (LEXAPRO) 20 MG tablet Take 20 mg by mouth at bedtime.     ezetimibe-simvastatin (VYTORIN) 10-40 MG per tablet Take 1 tablet by mouth at  bedtime.      fenofibrate 160 MG tablet Take 160 mg by mouth every morning.      Fluticasone-Salmeterol (ADVAIR DISKUS) 100-50 MCG/DOSE AEPB Inhale 1 puff into the lungs 2 (two) times daily.      furosemide (LASIX) 40 MG tablet Take 40 mg by mouth every other day.      glimepiride (AMARYL) 4 MG tablet Take 4 mg by mouth daily.      insulin glargine (LANTUS) 100 unit/mL SOPN Inject 24 Units into the skin at bedtime.      lansoprazole (PREVACID) 30 MG capsule Take 30 mg by mouth every morning.   3   losartan (COZAAR) 50 MG tablet Take 50 mg by mouth daily.     metFORMIN (GLUCOPHAGE) 1000 MG tablet Take 1,000 mg by mouth daily with breakfast.      metoprolol (LOPRESSOR) 50 MG tablet Take 50 mg by mouth 2 (two) times daily.      nitroGLYCERIN (NITRODUR - DOSED IN MG/24 HR) 0.4 mg/hr patch Apply patch in the morning, remove at bedtime (Patient taking differently: Place 0.4 mg onto the skin See admin instructions. Apply one patch (0.4 mg) to upper chest every  morning, remove at bedtime) 30 patch 12   nitroGLYCERIN (NITROSTAT) 0.4 MG SL tablet Place 1 tablet (0.4 mg total) under the tongue every 5 (five) minutes as needed for chest pain. 25 tablet 3   oxyCODONE-acetaminophen (PERCOCET) 5-325 MG tablet Take 1 tablet by mouth every 4 (four) hours as needed (max 6 q). (Patient taking differently: Take 1 tablet  by mouth at bedtime as needed (pain). ) 30 tablet 0   OXYGEN Inhale into the lungs. 2L at night     pramipexole (MIRAPEX) 0.25 MG tablet Take 0.25 mg by mouth at bedtime.      pregabalin (LYRICA) 100 MG capsule Take 100-200 mg by mouth See admin instructions. Take 1 tablet (100 mg) by mouth every morning and 2 tablets (200 mg) at bedtime     ranolazine (RANEXA) 1000 MG SR tablet Take 1 tablet (1,000 mg total) by mouth 2 (two) times daily. 180 tablet 3   terazosin (HYTRIN) 1 MG capsule Take 1 mg by mouth at bedtime.      tiotropium (SPIRIVA) 18 MCG inhalation capsule Place 18 mcg into  inhaler and inhale every morning.      No current facility-administered medications on file prior to visit.     Allergies:  No Known Allergies  Physical Exam General: Mildly obese middle-aged Caucasian male, seated, in no evident distress Head: head normocephalic and atraumatic.   Neck: supple with no carotid or supraclavicular bruits Cardiovascular: regular rate and rhythm, no murmurs Musculoskeletal: no deformity Skin:  no rash/petichiae Vascular:  Normal pulses all extremities  Neurologic Exam Mental Status: Awake and fully alert. Oriented to place and time. Recent and remote memory intact. Attention span, concentration and fund of knowledge appropriate. Mood and affect appropriate.  Cranial Nerves: Fundoscopic exam not done. Pupils equal, briskly reactive to light. Extraocular movements full without nystagmus. Visual fields full to confrontation. Hearing intact. Facial sensation intact. Face, tongue, palate moves normally and symmetrically.  Motor: Normal bulk and tone. Normal strength in all tested extremity muscles. Sensory.: intact to touch , pinprick , position   sensation.  Diminished vibration over toes bilaterally.  Positive Romberg sign. Coordination: Rapid alternating movements normal in all extremities. Finger-to-nose and heel-to-shin performed accurately bilaterally.  Unsteady while standing on either foot unsupported. Gait and Station: Arises from chair without difficulty. Stance is broad-based l. Gait demonstrates normal stride length and mild  imbalance . Able to heel, toe and tandem walk with great difficulty.  Reflexes: 1+ and symmetric. Toes downgoing.       ASSESSMENT: 63 year old Caucasian male with recurrent transient episodes of left eye, face and body weakness possibly episodes of complicated migraine.He does have significant vascular risk factors of coronary artery disease status post stenting and surgery, hypertension, hyperlipidemia and diabetes.  and silent  infarcts on MRI.  He also has mild underlying diabetic sensory polyneuropathy     PLAN:I had a long discussion with the patient regarding his episodes of refractory migraine headaches and complicated migraine episodes and answered questions.  He seems to be tolerating Depakote well but is still having frequent headaches hence I recommend increasing the dose to Depakote ER 500 mg twice daily and to use Fioricet for symptomatic relief but to limit to not more than 2 doses per day and not more than 2 days/week.  He will return for follow-up in the future in 3 months with my nurse practitioner Janett Billow or call earlier if necessary.  Continue aspirin 81 mg daily for stroke prevention and maintain aggressive risk factor modification with strict control of hypertension with blood pressure goal below 130/90, diabetes with hemoglobin A1c goal below 6.5% and lipids with LDL cholesterol goal below 70 mg percent.  He was also encouraged to eat a healthy diet and to be active and exercise regularly and not gain weight.  Greater than 50% time during this 25-minute   visit was  spent on counseling and coordination of care about his recurrent stroke typical episodes and discussion about TIA versus complicated migraine and answering questions   Antony Contras, MD  [facility] [address] [place], [place] [address]  Phone [phone] Fax [phone]  Note: This document was prepared with digital dictation and possible smart phrase technology. Any transcriptional errors that result from this process are unintentional.

## 2019-05-12 DIAGNOSIS — E113293 Type 2 diabetes mellitus with mild nonproliferative diabetic retinopathy without macular edema, bilateral: Secondary | ICD-10-CM | POA: Diagnosis not present

## 2019-05-12 DIAGNOSIS — H40013 Open angle with borderline findings, low risk, bilateral: Secondary | ICD-10-CM | POA: Diagnosis not present

## 2019-05-12 DIAGNOSIS — H35033 Hypertensive retinopathy, bilateral: Secondary | ICD-10-CM | POA: Diagnosis not present

## 2019-05-12 DIAGNOSIS — H2513 Age-related nuclear cataract, bilateral: Secondary | ICD-10-CM | POA: Diagnosis not present

## 2019-06-03 DIAGNOSIS — Z1212 Encounter for screening for malignant neoplasm of rectum: Secondary | ICD-10-CM | POA: Diagnosis not present

## 2019-06-10 DIAGNOSIS — J449 Chronic obstructive pulmonary disease, unspecified: Secondary | ICD-10-CM | POA: Diagnosis not present

## 2019-06-24 ENCOUNTER — Other Ambulatory Visit: Payer: Self-pay | Admitting: Interventional Radiology

## 2019-06-24 DIAGNOSIS — T82330A Leakage of aortic (bifurcation) graft (replacement), initial encounter: Secondary | ICD-10-CM

## 2019-06-24 DIAGNOSIS — IMO0002 Reserved for concepts with insufficient information to code with codable children: Secondary | ICD-10-CM

## 2019-07-05 DIAGNOSIS — Z86010 Personal history of colonic polyps: Secondary | ICD-10-CM | POA: Diagnosis not present

## 2019-07-05 DIAGNOSIS — K7460 Unspecified cirrhosis of liver: Secondary | ICD-10-CM | POA: Diagnosis not present

## 2019-07-05 DIAGNOSIS — R109 Unspecified abdominal pain: Secondary | ICD-10-CM | POA: Diagnosis not present

## 2019-07-05 DIAGNOSIS — K7689 Other specified diseases of liver: Secondary | ICD-10-CM | POA: Diagnosis not present

## 2019-07-10 ENCOUNTER — Other Ambulatory Visit: Payer: Self-pay | Admitting: Gastroenterology

## 2019-07-10 DIAGNOSIS — K7689 Other specified diseases of liver: Secondary | ICD-10-CM

## 2019-07-10 DIAGNOSIS — R109 Unspecified abdominal pain: Secondary | ICD-10-CM

## 2019-07-10 DIAGNOSIS — K7460 Unspecified cirrhosis of liver: Secondary | ICD-10-CM

## 2019-07-11 DIAGNOSIS — J449 Chronic obstructive pulmonary disease, unspecified: Secondary | ICD-10-CM | POA: Diagnosis not present

## 2019-08-07 ENCOUNTER — Other Ambulatory Visit: Payer: Self-pay | Admitting: Neurology

## 2019-08-08 DIAGNOSIS — J449 Chronic obstructive pulmonary disease, unspecified: Secondary | ICD-10-CM | POA: Diagnosis not present

## 2019-08-23 DIAGNOSIS — E113293 Type 2 diabetes mellitus with mild nonproliferative diabetic retinopathy without macular edema, bilateral: Secondary | ICD-10-CM | POA: Diagnosis not present

## 2019-08-23 DIAGNOSIS — H2513 Age-related nuclear cataract, bilateral: Secondary | ICD-10-CM | POA: Diagnosis not present

## 2019-08-23 DIAGNOSIS — H40013 Open angle with borderline findings, low risk, bilateral: Secondary | ICD-10-CM | POA: Diagnosis not present

## 2019-08-23 DIAGNOSIS — H25013 Cortical age-related cataract, bilateral: Secondary | ICD-10-CM | POA: Diagnosis not present

## 2019-08-23 DIAGNOSIS — H2512 Age-related nuclear cataract, left eye: Secondary | ICD-10-CM | POA: Diagnosis not present

## 2019-08-24 ENCOUNTER — Encounter: Payer: Self-pay | Admitting: Adult Health

## 2019-08-24 ENCOUNTER — Other Ambulatory Visit: Payer: Self-pay

## 2019-08-24 ENCOUNTER — Ambulatory Visit: Payer: Medicare HMO | Admitting: Adult Health

## 2019-08-24 VITALS — BP 122/52 | HR 50 | Temp 96.8°F | Ht 71.0 in | Wt 210.4 lb

## 2019-08-24 DIAGNOSIS — K7469 Other cirrhosis of liver: Secondary | ICD-10-CM | POA: Diagnosis not present

## 2019-08-24 DIAGNOSIS — D519 Vitamin B12 deficiency anemia, unspecified: Secondary | ICD-10-CM | POA: Diagnosis not present

## 2019-08-24 DIAGNOSIS — I2510 Atherosclerotic heart disease of native coronary artery without angina pectoris: Secondary | ICD-10-CM | POA: Diagnosis not present

## 2019-08-24 DIAGNOSIS — G43909 Migraine, unspecified, not intractable, without status migrainosus: Secondary | ICD-10-CM | POA: Diagnosis not present

## 2019-08-24 DIAGNOSIS — E11319 Type 2 diabetes mellitus with unspecified diabetic retinopathy without macular edema: Secondary | ICD-10-CM | POA: Diagnosis not present

## 2019-08-24 DIAGNOSIS — G44221 Chronic tension-type headache, intractable: Secondary | ICD-10-CM | POA: Diagnosis not present

## 2019-08-24 DIAGNOSIS — G43919 Migraine, unspecified, intractable, without status migrainosus: Secondary | ICD-10-CM

## 2019-08-24 DIAGNOSIS — I119 Hypertensive heart disease without heart failure: Secondary | ICD-10-CM | POA: Diagnosis not present

## 2019-08-24 DIAGNOSIS — Z794 Long term (current) use of insulin: Secondary | ICD-10-CM | POA: Diagnosis not present

## 2019-08-24 DIAGNOSIS — K7460 Unspecified cirrhosis of liver: Secondary | ICD-10-CM | POA: Diagnosis not present

## 2019-08-24 DIAGNOSIS — G43809 Other migraine, not intractable, without status migrainosus: Secondary | ICD-10-CM

## 2019-08-24 DIAGNOSIS — E1151 Type 2 diabetes mellitus with diabetic peripheral angiopathy without gangrene: Secondary | ICD-10-CM | POA: Diagnosis not present

## 2019-08-24 DIAGNOSIS — I6990 Unspecified sequelae of unspecified cerebrovascular disease: Secondary | ICD-10-CM | POA: Diagnosis not present

## 2019-08-24 MED ORDER — UBRELVY 50 MG PO TABS: 50.0000 mg | ORAL_TABLET | ORAL | 0 refills | Status: DC | PRN

## 2019-08-24 MED ORDER — DIVALPROEX SODIUM ER 500 MG PO TB24: 500.0000 mg | ORAL_TABLET | Freq: Every day | ORAL | 2 refills | Status: DC

## 2019-08-24 MED ORDER — AMITRIPTYLINE HCL 10 MG PO TABS: 10.0000 mg | ORAL_TABLET | Freq: Every day | ORAL | 3 refills | Status: DC

## 2019-08-24 NOTE — Patient Instructions (Signed)
Your Plan:  Recommend starting amitriptyline 10 mg nightly for hopeful benefit of daily headaches -continue for 1 week and call office for possible need of increase  Continue Depakote 500 mg twice daily for 1 week and then decrease to once daily  Stop use of Fioricet as no benefit.  Start Ubrelvy 50mg  as needed for migraine headache relief. Max of 2 tablets daily. If this works for you, please call office and an order will be placed.   Follow up in 3 months or call earlier if needed     Thank you for coming to see [place] at [facility]. I hope we have been able to provide you high quality care today.  You may receive a patient satisfaction survey over the next few weeks. We would appreciate your feedback and comments so that we may continue to improve ourselves and the health of our patients.

## 2019-08-24 NOTE — Progress Notes (Signed)
[facility] [address] [place]. [place] [address] [phone]       OFFICE FOLLOW UP NOTE  [name] Date of Birth:  10/15/1955 Medical Record Number:  [id number]   Referring MD: Rosalin Hawking Reason for Referral: TIA vs complicated migraine  Chief complaint: Chief Complaint  Patient presents with  . Follow-up    Rm 9, wife.    [name] TIA/migraines    pt still has daily headaches,  memory loss, interested in research      HPI:  [name] is a 64 year old male who is being seen today, 08/24/2019, for migraine follow-up.  At prior visit, Depakote dosage increased from 500 mg nightly to 500 mg twice daily but endorses ongoing daily headaches and experiences complicated migraines with left facial paralysis approximately once every 1 to 2 weeks.  Daily headache and complicated migraine typically last 5-30 minutes and then subside and can occur multiple times throughout the day.  Denies photophobia, phonophobia or N/V.  He endorses daily headaches 8/10 pain bilaterally with pressure sensation.  When he experiences a migraine, his headache will start as his typical daily headache and then reports sensation as if "my brain is twisting" and then will experience left facial weakness.  He has not experienced left arm or leg symptoms.  He does not believe he has seen any benefit with use of Depakote and does report increasing tremors.  He did trial use of Fioricet once but did not provide benefit.  He also reports slowly progressing short-term memory and cognition impairment.  Continues on aspirin 81 mg daily for stroke prevention.  Blood pressure today 122/52.  No further concerns at this time.   History provided for reference purposes only Initial visit 03/03/2019 PS ; [name] is a pleasant 64 year old Caucasian male seen today for initial office consultation visit.  He is accompanied by his wife.  History is obtained from them and review of electronic medical records and I  personally reviewed imaging films in PACS.  He has a past medical history of hypertension, hyperlipidemia and TIAs who presented to [facility] on 01/30/2019 with sudden onset of double vision and unsteady gait and left face and arm weakness which lasted 5 to 10 minutes.  He states his episodes are still typical and have been occurring off and on for the last 10 to 15 years.  He states that it begins with sensation of drooping of his left eye followed by left face and then left arm and leg the whole episode evolves over a minute or so and last for about 10 minutes usually but may have lasted up to an hour and a few occasions.  He feels tired after the episode and needs to rest.  No specific trigger for these episodes and they occur at variable random frequency.  He is able to sit down when the episode is beginning and not hurting himself.  He feels if he is not sitting he will not be able to walk.  He usually waits for the episode to finish.  He does complain of mild headache during the episode but it is not severe or bothersome.  He feels at times that his brain is twisting in his head when the episode is occurring.  Patient in fact was evaluated by me in 2013 for these episodes which are felt to be small vessel disease TIAs.  He was subsequently lost to follow-up.  Patient now informs me today that his mother has also  had similar episodes which are recurrent and stereotypical.  Patient denies any prior known history of migraine headaches or recurrent severe headaches which are disabling accompanying when light sound sensitivity.  During the last episode in August 2020 had MRI scan of the brain which showed no acute abnormality but did show a few remote his lacunar infarcts and moderate changes of chronic small vessel disease.  Transthoracic echo was unremarkable.  EEG was obtained which showed no epileptiform activity.  CT angiogram of the brain and neck showed 50% stenosis of bilateral internal carotid  arteries and 50% stenosis at the origin of the right vertebral artery.  LDL cholesterol was low at 25 mg percent and hemoglobin A1c was elevated at 8.8.  Urine drug screen was negative.  Patient was started on dual antiplatelet therapy initially but because of thrombocytopenia and low platelet count of 50,000 advised to take aspirin alone and stop Plavix.  Dr. Erlinda Hong who evaluated the patient in August felt episodes could be recurrent hemiplegic migraine and suggested a trial of verapamil but the patient has not taken that. Update 05/10/2019 PS ; he returns for follow-up after last visit 2 months ago.  He is accompanied by his wife.  He states he has not had any further episodes of complicated migraine in the form of vision difficulties, unsteady gait and left-sided weakness and numbness.  He however continues to have almost daily headaches.  The headaches can occur once or twice a day.  They are moderate to severe and often disabling he has to lie down.  Is not taking any symptomatic medications.  Is taking Depakote ER 5 mg at night is tolerating it well without any side effects.  He is unable to need for specific triggers.  He has had no new health problems.  He has no other new complaints.     ROS:   14 system review of systems is positive for complicated migraine and daily headache and all other systems negative  PMH:  Past Medical History:  Diagnosis Date  . AAA (abdominal aortic aneurysm) ([place])   . Anginal pain ([place])   . Bulging lumbar disc    "& some in my neck"  . C. difficile colitis    2008 OR 09  . CAD (coronary artery disease)   . Change in platelet count    pt states 'has been low since 2012; patient had to receive platelets in surgery in Nov 2016'  . Chronic back pain   . Cirrhosis ([place])   . COPD (chronic obstructive pulmonary disease) ([place])   . Diabetic peripheral neuropathy ([place])   . Emphysema lung ([place])   . Enlarged liver   . Enlarged prostate   . GERD (gastroesophageal  reflux disease)   . Hardening of the arteries of the brain   . Headache    "~ weekly" (04/12/2015)  . Heart attack [facility]) Nov. 15,2016   TIA  . History of colon polyps   . Hyperlipidemia   . Hypertension   . Leg pain    with walking and pain in feet while lying flat  . Memory loss    Due to hardening of the arteries in the brain  . Myocardial infarction ([place])    "I've had probably 6-7" (04/12/2015)  . Restless leg syndrome   . Rheumatic fever    AS A CHILD  ?6 YRS OLD  . RLS (restless legs syndrome)   . Shortness of breath 12/06/10   while lying flat and with exertion  .  Snoring   . Spleen enlarged   . Stroke [facility]) 2004   denies residual on 04/12/2015  . Thrombocytopenia ([place])    since 2012; can be as low as 54K.   [name] TIA (transient ischemic attack) "several"   "since 2015" (04/12/2015)  . Type II diabetes mellitus ([place]) dx'd 2009    Social History:  Social History   Socioeconomic History  . Marital status: Married    Spouse name: reba  . Number of children: 2  . Years of education: 9  . Highest education level: Not on file  Occupational History  . Occupation: disability  Tobacco Use  . Smoking status: Former Smoker    Packs/day: 0.00    Years: 30.00    Pack years: 0.00    Types: Cigarettes    Quit date: 02/17/2001    Years since quitting: 18.5  . Smokeless tobacco: Never Used  Substance and Sexual Activity  . Alcohol use: Yes    Alcohol/week: 1.0 standard drinks    Types: 1 Cans of beer per week    Comment: occasional  . Drug use: No  . Sexual activity: Not Currently  Other Topics Concern  . Not on file  Social History Narrative   Patient lives at home with his wife.    Social Determinants of Health   Financial Resource Strain:   . Difficulty of Paying Living Expenses:   Food Insecurity:   . Worried About Charity fundraiser in the Last Year:   . Arboriculturist in the Last Year:   Transportation Needs:   . Film/video editor (Medical):   [name] Lack  of Transportation (Non-Medical):   Physical Activity:   . Days of Exercise per Week:   . Minutes of Exercise per Session:   Stress:   . Feeling of Stress :   Social Connections:   . Frequency of Communication with Friends and Family:   . Frequency of Social Gatherings with Friends and Family:   . Attends Religious Services:   . Active Member of Clubs or Organizations:   . Attends Archivist Meetings:   [name] Marital Status:   Intimate Partner Violence:   . Fear of Current or Ex-Partner:   . Emotionally Abused:   [name] Physically Abused:   . Sexually Abused:     Medications:   Current Outpatient Medications on File Prior to Visit  Medication Sig Dispense Refill  . albuterol (PROVENTIL HFA;VENTOLIN HFA) 108 (90 BASE) MCG/ACT inhaler Inhale 2 puffs into the lungs every 6 (six) hours as needed for wheezing or shortness of breath.     [name] aspirin EC 81 MG tablet Take 1 tablet (81 mg total) by mouth daily.    . Cholecalciferol (VITAMIN D) 2000 units tablet Take 2,000 Units by mouth 2 (two) times daily.     . clonazePAM (KLONOPIN) 1 MG tablet Take 1 mg by mouth 2 (two) times daily.     . cyanocobalamin (,VITAMIN B-12,) 1000 MCG/ML injection Inject 1,000 mcg into the muscle See admin instructions. Inject 1000 mcg intramuscularly once every 3 months    . empagliflozin (JARDIANCE) 10 MG TABS tablet Take 10 mg by mouth every morning.     . escitalopram (LEXAPRO) 20 MG tablet Take 20 mg by mouth at bedtime.    [name] ezetimibe-simvastatin (VYTORIN) 10-40 MG per tablet Take 1 tablet by mouth at bedtime.     . fenofibrate 160 MG tablet Take 160 mg by mouth every morning.     [name]  Fluticasone-Salmeterol (ADVAIR DISKUS) 100-50 MCG/DOSE AEPB Inhale 1 puff into the lungs 2 (two) times daily.     . furosemide (LASIX) 40 MG tablet Take 40 mg by mouth every other day.     [name] glimepiride (AMARYL) 4 MG tablet Take 4 mg by mouth daily.     . insulin glargine (LANTUS) 100 unit/mL SOPN Inject 24 Units into the skin  at bedtime.     . lansoprazole (PREVACID) 30 MG capsule Take 30 mg by mouth every morning.   3  . losartan (COZAAR) 50 MG tablet Take 50 mg by mouth daily.    . metFORMIN (GLUCOPHAGE) 1000 MG tablet Take 1,000 mg by mouth daily with breakfast.     . metoprolol (LOPRESSOR) 50 MG tablet Take 50 mg by mouth 2 (two) times daily.     . nitroGLYCERIN (NITRODUR - DOSED IN MG/24 HR) 0.4 mg/hr patch Apply patch in the morning, remove at bedtime (Patient taking differently: Place 0.4 mg onto the skin See admin instructions. Apply one patch (0.4 mg) to upper chest every  morning, remove at bedtime) 30 patch 12  . nitroGLYCERIN (NITROSTAT) 0.4 MG SL tablet Place 1 tablet (0.4 mg total) under the tongue every 5 (five) minutes as needed for chest pain. 25 tablet 3  . oxyCODONE-acetaminophen (PERCOCET) 5-325 MG tablet Take 1 tablet by mouth every 4 (four) hours as needed (max 6 q). (Patient taking differently: Take 1 tablet by mouth at bedtime as needed (pain). ) 30 tablet 0  . OXYGEN Inhale into the lungs. 2L at night    . pramipexole (MIRAPEX) 0.25 MG tablet Take 0.25 mg by mouth at bedtime.     . pregabalin (LYRICA) 100 MG capsule Take 100-200 mg by mouth See admin instructions. Take 1 tablet (100 mg) by mouth every morning and 2 tablets (200 mg) at bedtime    . ranolazine (RANEXA) 1000 MG SR tablet Take 1 tablet (1,000 mg total) by mouth 2 (two) times daily. 180 tablet 3  . terazosin (HYTRIN) 1 MG capsule Take 1 mg by mouth at bedtime.     [name] tiotropium (SPIRIVA) 18 MCG inhalation capsule Place 18 mcg into inhaler and inhale every morning.      No current facility-administered medications on file prior to visit.    Allergies:  No Known Allergies  Physical Exam  Today's Vitals   08/24/19 1239  BP: (!) 122/52  Pulse: (!) 50  Temp: (!) 96.8 F (36 C)  SpO2: 96%  Weight: 210 lb 6.4 oz (95.4 kg)  Height: 5\' 11"  (1.803 m)   Body mass index is 29.34 kg/m.  General: Mildly obese middle-aged Caucasian  male, seated, in no evident distress Head: head normocephalic and atraumatic.   Neck: supple with no carotid or supraclavicular bruits Cardiovascular: regular rate and rhythm, no murmurs Musculoskeletal: no deformity Skin:  no rash/petichiae Vascular:  Normal pulses all extremities  Neurologic Exam Mental Status: Awake and fully alert.  Normal speech and language.  Oriented to place and time. Recent memory diminished and remote memory intact. Attention span, concentration and fund of knowledge mostly appropriate but would rely on his wife to answer certain questions. Mood and affect appropriate.  Cranial Nerves: Pupils equal, briskly reactive to light. Extraocular movements full without nystagmus. Visual fields full to confrontation. Hearing intact. Facial sensation intact. Face, tongue, palate moves normally and symmetrically.  Motor: Normal bulk and tone. Normal strength in all tested extremity muscles. Sensory.: intact to touch , pinprick , position  sensation.  Diminished vibration over toes bilaterally.   Coordination: Rapid alternating movements normal in all extremities. Finger-to-nose and heel-to-shin performed accurately bilaterally.  Unsteady while standing on either foot unsupported. Gait and Station: Arises from chair without difficulty. Stance is broad-based. Gait demonstrates normal stride length and mild  imbalance  Reflexes: 1+ and symmetric. Toes downgoing.       ASSESSMENT: 63 year old Caucasian male with recurrent transient episodes of left eye, face and body weakness possibly episodes of complicated migraine. He does have significant vascular risk factors of coronary artery disease status post stenting and surgery, hypertension, hyperlipidemia and diabetes and silent infarcts on MRI.  He also has mild underlying diabetic sensory polyneuropathy.  He continues to experience daily headaches and complicated migraines approximately once every 1 to 2 weeks consisting of left-sided  facial weakness but has not experienced left arm or leg weakness.     PLAN: -Abnormal migraine presentation and symptoms are bilateral, pressure sensation and occur for short duration occurring multiple times per day.  Potentially tension related daily headaches but questionable duration.  Recommend initiating amitriptyline 10 mg nightly for tension versus migraine management.  Advised to call after 1 week for possible need of increase.  Recommend decreasing Depakote dosage to 500 mg daily as declines benefit with increased dosage and possible tremor side effect.  Provided samples for Ubrevly 50 mg as needed for emergent migraine relief -advised to only use with migraine accompanied by facial weakness.  Discontinue Fioricet as no benefit.  Advised to call office if benefit with Ubrevly and order will be placed.  May need to be referred to Dr. Jaynee Eagles in the future if indicated -Short-term memory and cognitive concerns likely vascular cognitive impairment from prior strokes.  Discussion regarding importance of managing stroke risk factors, increasing daily activity and healthy diet.  Also discussed use of memory exercises and compensation strategies. -maintain aggressive risk factor modification with strict control of hypertension with blood pressure goal below 130/90, diabetes with hemoglobin A1c goal below 6.5% and lipids with LDL cholesterol goal below 70 mg percent.  He was also encouraged to eat a healthy diet and to be active and exercise regularly and not gain weight.    Follow-up in 3 months or call earlier if needed  I spent 35 minutes of face-to-face and non-face-to-face time with patient.  This included previsit chart review, lab review, study review, order entry, electronic health record documentation, patient education   Frann Rider, [facility]  [facility] [address] [place], [place] [address]  Phone [phone] Fax [phone] Note: This document  was prepared with digital dictation and possible smart phrase technology. Any transcriptional errors that result from this process are unintentional.

## 2019-08-26 ENCOUNTER — Other Ambulatory Visit: Payer: Self-pay | Admitting: Interventional Cardiology

## 2019-08-26 DIAGNOSIS — I25708 Atherosclerosis of coronary artery bypass graft(s), unspecified, with other forms of angina pectoris: Secondary | ICD-10-CM

## 2019-09-02 ENCOUNTER — Other Ambulatory Visit: Payer: Self-pay

## 2019-09-02 ENCOUNTER — Ambulatory Visit
Admission: RE | Admit: 2019-09-02 | Discharge: 2019-09-02 | Disposition: A | Discharge status: Home / Self Care | Payer: Medicare HMO | Source: Ambulatory Visit | Attending: Gastroenterology | Admitting: Gastroenterology

## 2019-09-02 DIAGNOSIS — K7460 Unspecified cirrhosis of liver: Secondary | ICD-10-CM | POA: Diagnosis not present

## 2019-09-02 DIAGNOSIS — R109 Unspecified abdominal pain: Secondary | ICD-10-CM

## 2019-09-02 DIAGNOSIS — K7689 Other specified diseases of liver: Secondary | ICD-10-CM

## 2019-09-02 MED ORDER — GADOBENATE DIMEGLUMINE 529 MG/ML IV SOLN
20.0000 mL | Freq: Once | INTRAVENOUS | Status: AC | PRN
  Administered 2019-09-02: 11:00:00 20 mL via INTRAVENOUS

## 2019-09-08 DIAGNOSIS — J449 Chronic obstructive pulmonary disease, unspecified: Secondary | ICD-10-CM | POA: Diagnosis not present

## 2019-09-10 NOTE — Progress Notes (Signed)
I agree with the above plan 

## 2019-09-15 ENCOUNTER — Ambulatory Visit
Admission: RE | Admit: 2019-09-15 | Discharge: 2019-09-15 | Disposition: A | Discharge status: Home / Self Care | Payer: Medicare HMO | Source: Ambulatory Visit | Attending: Interventional Radiology | Admitting: Interventional Radiology

## 2019-09-15 ENCOUNTER — Other Ambulatory Visit: Payer: Self-pay

## 2019-09-15 ENCOUNTER — Encounter: Payer: Self-pay | Admitting: *Deleted

## 2019-09-15 DIAGNOSIS — H25812 Combined forms of age-related cataract, left eye: Secondary | ICD-10-CM | POA: Diagnosis not present

## 2019-09-15 DIAGNOSIS — Z9889 Other specified postprocedural states: Secondary | ICD-10-CM | POA: Diagnosis not present

## 2019-09-15 DIAGNOSIS — T82330A Leakage of aortic (bifurcation) graft (replacement), initial encounter: Secondary | ICD-10-CM

## 2019-09-15 DIAGNOSIS — H2512 Age-related nuclear cataract, left eye: Secondary | ICD-10-CM | POA: Diagnosis not present

## 2019-09-15 DIAGNOSIS — T82330D Leakage of aortic (bifurcation) graft (replacement), subsequent encounter: Secondary | ICD-10-CM | POA: Diagnosis not present

## 2019-09-15 DIAGNOSIS — IMO0002 Reserved for concepts with insufficient information to code with codable children: Secondary | ICD-10-CM

## 2019-09-15 HISTORY — PX: IR RADIOLOGIST EVAL & MGMT: IMG5224

## 2019-09-15 NOTE — Progress Notes (Signed)
Chief Complaint: Patient was seen remotely remotely today (TeleHealth) for endoleak surveillance at the request of [place].    Referring Physician(s): Dr. Ruta Hinds  History of Present Illness: Christian Mccall is a 64 y.o. male withhistory of cirrhosis, CAD, MI, CVA, COPD, HLD, HTN, DM, and AAA with type II endoleak and previous repair in November of 2016.    Surveillance imaging demonstrated recurrent type II endoleak caudal to the prior endovascular repair.Despite having a stable size in the aneurysm sac, there was concern for outpouching in the left posterolateral aspect of the sacin the region of the new endoleak.  He underwent a direct sac puncture with endoleak repair using liquid embolic on [id number].Surveillance imaging since that time has demonstrated a small residual persistent endoleak but no further enlargement of the Endosac.  He presents today for follow-up evaluation.   Mr. Scimeca is currently feeling well.  He denies symptoms of abdominal or flank pain.  He has no chest pain or shortness of breath.  He reports that he had cataract surgery earlier this morning and is doing well from that procedure.   Past Medical History:  Diagnosis Date  . AAA (abdominal aortic aneurysm) ([place])   . Anginal pain ([place])   . Bulging lumbar disc    "& some in my neck"  . C. difficile colitis    2008 OR 09  . CAD (coronary artery disease)   . Change in platelet count    pt states 'has been low since 2012; patient had to receive platelets in surgery in Nov 2016'  . Chronic back pain   . Cirrhosis ([place])   . COPD (chronic obstructive pulmonary disease) ([place])   . Diabetic peripheral neuropathy ([place])   . Emphysema lung ([place])   . Enlarged liver   . Enlarged prostate   . GERD (gastroesophageal reflux disease)   . Hardening of the arteries of the brain   . Headache    "~ weekly" (04/12/2015)  . Heart attack [facility]) Nov. 15,2016   TIA  . History of colon polyps   .  Hyperlipidemia   . Hypertension   . Leg pain    with walking and pain in feet while lying flat  . Memory loss    Due to hardening of the arteries in the brain  . Myocardial infarction ([place])    "I've had probably 6-7" (04/12/2015)  . Restless leg syndrome   . Rheumatic fever    AS A CHILD  ?6 YRS OLD  . RLS (restless legs syndrome)   . Shortness of breath 12/06/10   while lying flat and with exertion  . Snoring   . Spleen enlarged   . Stroke [facility]) 2004   denies residual on 04/12/2015  . Thrombocytopenia ([place])    since 2012; can be as low as 54K.   [name] TIA (transient ischemic attack) "several"   "since 2015" (04/12/2015)  . Type II diabetes mellitus ([place]) dx'd 2009    Past Surgical History:  Procedure Laterality Date  . ABDOMINAL AORTIC ANEURYSM REPAIR  01/16/2011   EVAR  . ABDOMINAL AORTIC ANEURYSM REPAIR  04/12/2015  . CARDIAC CATHETERIZATION  "several"  . COLONOSCOPY    . COLONOSCOPY W/ BIOPSIES    . COLONOSCOPY WITH PROPOFOL N/A 03/21/2016   Procedure: COLONOSCOPY WITH PROPOFOL;  Surgeon: Arta Silence, MD;  Location: [facility] ENDOSCOPY;  Service: Endoscopy;  Laterality: N/A;  . CORONARY ANGIOPLASTY WITH STENT PLACEMENT  "several"   > 4 sents  . CORONARY  ARTERY BYPASS GRAFT  02/18/2001   "CABG x 4"  . Direct sac puncture with liquid embolic repair of Type II endoleak  04/12/2015; 03/05/2017  . ESOPHAGOGASTRODUODENOSCOPY (EGD) WITH PROPOFOL N/A 03/21/2016   Procedure: ESOPHAGOGASTRODUODENOSCOPY (EGD) WITH PROPOFOL;  Surgeon: Arta Silence, MD;  Location: [facility] ENDOSCOPY;  Service: Endoscopy;  Laterality: N/A;  . IR AORTAGRAM ABDOMINAL SERIALOGRAM  03/05/2017  . IR CT SPINE LTD  03/05/2017  . IR EMBO ARTERIAL NOT HEMORR HEMANG INC GUIDE ROADMAPPING  03/05/2017  . IR RADIOLOGIST EVAL & MGMT  02/05/2017  . IR RADIOLOGIST EVAL & MGMT  04/01/2017  . IR RADIOLOGIST EVAL & MGMT  10/01/2017  . IR RADIOLOGIST EVAL & MGMT  02/04/2019  . LEFT HEART CATH AND CORS/GRAFTS ANGIOGRAPHY N/A 11/13/2018    Procedure: LEFT HEART CATH AND CORS/GRAFTS ANGIOGRAPHY;  Surgeon: Belva Crome, MD;  Location: [place] CV LAB;  Service: Cardiovascular;  Laterality: N/A;  . PERIPHERAL VASCULAR CATHETERIZATION N/A 03/10/2015   Procedure: Abdominal Aortogram;  Surgeon: Elam Dutch, MD;  Location: [place] CV LAB;  Service: Cardiovascular;  Laterality: N/A;  . RADIOLOGY WITH ANESTHESIA N/A 04/12/2015   Procedure: embolization;  Surgeon: Jacqulynn Cadet, MD;  Location: [place];  Service: Radiology;  Laterality: N/A;  . RADIOLOGY WITH ANESTHESIA N/A 03/05/2017   Procedure: Type II Endoleak Repair;  Surgeon: Jacqulynn Cadet, MD;  Location: [place];  Service: Radiology;  Laterality: N/A;  . SHOULDER SURGERY Right 07-27-2015  . TONSILLECTOMY  ~ 1965    Allergies: Patient has no known allergies.  Medications: Prior to Admission medications   Medication Sig Start Date End Date Taking? Authorizing Provider  albuterol (PROVENTIL HFA;VENTOLIN HFA) 108 (90 BASE) MCG/ACT inhaler Inhale 2 puffs into the lungs every 6 (six) hours as needed for wheezing or shortness of breath.     [provider]  amitriptyline (ELAVIL) 10 MG tablet Take 1 tablet (10 mg total) by mouth at bedtime. 08/24/19   Frann Rider, NP  aspirin EC 81 MG tablet Take 1 tablet (81 mg total) by mouth daily. 03/22/16   Arta Silence, MD  Cholecalciferol (VITAMIN D) 2000 units tablet Take 2,000 Units by mouth 2 (two) times daily.     [provider]  clonazePAM (KLONOPIN) 1 MG tablet Take 1 mg by mouth 2 (two) times daily.     [provider]  cyanocobalamin (,VITAMIN B-12,) 1000 MCG/ML injection Inject 1,000 mcg into the muscle See admin instructions. Inject 1000 mcg intramuscularly once every 3 months    [provider]  divalproex (DEPAKOTE ER) 500 MG 24 hr tablet Take 1 tablet (500 mg total) by mouth daily. 08/24/19   Frann Rider, NP  empagliflozin (JARDIANCE) 10 MG TABS tablet Take 10 mg by mouth every  morning.     [provider]  escitalopram (LEXAPRO) 20 MG tablet Take 20 mg by mouth at bedtime.    [provider]  ezetimibe-simvastatin (VYTORIN) 10-40 MG per tablet Take 1 tablet by mouth at bedtime.     [provider]  fenofibrate 160 MG tablet Take 160 mg by mouth every morning.  11/03/10   [provider]  Fluticasone-Salmeterol (ADVAIR DISKUS) 100-50 MCG/DOSE AEPB Inhale 1 puff into the lungs 2 (two) times daily.     [provider]  furosemide (LASIX) 40 MG tablet Take 40 mg by mouth every other day.     [provider]  glimepiride (AMARYL) 4 MG tablet Take 4 mg by mouth daily.  [provider]  insulin glargine (LANTUS) 100 unit/mL SOPN Inject 24 Units into the skin at bedtime.     [provider]  lansoprazole (PREVACID) 30 MG capsule Take 30 mg by mouth every morning.  03/11/14   [provider]  losartan (COZAAR) 50 MG tablet Take 50 mg by mouth daily.    [provider]  metFORMIN (GLUCOPHAGE) 1000 MG tablet Take 1,000 mg by mouth daily with breakfast.     [provider]  metoprolol (LOPRESSOR) 50 MG tablet Take 50 mg by mouth 2 (two) times daily.     [provider]  nitroGLYCERIN (NITRODUR - DOSED IN MG/24 HR) 0.4 mg/hr patch Apply patch in the morning, remove at bedtime Patient taking differently: Place 0.4 mg onto the skin See admin instructions. Apply one patch (0.4 mg) to upper chest every  morning, remove at bedtime 12/07/18   Isaiah Serge, NP  nitroGLYCERIN (NITROSTAT) 0.4 MG SL tablet PLACE 1 TABLET UNDER THE TONGUE EVERY 5 MINUTES AS NEEDED FOR CHEST PAIN 08/27/19   Belva Crome, MD  oxyCODONE-acetaminophen (PERCOCET) 5-325 MG tablet Take 1 tablet by mouth every 4 (four) hours as needed (max 6 q). Patient taking differently: Take 1 tablet by mouth at bedtime as needed (pain).  03/19/18   Shuford, Olivia Mackie, PA-C  OXYGEN Inhale into the lungs. 2L at night     [provider]  pramipexole (MIRAPEX) 0.25 MG tablet Take 0.25 mg by mouth at bedtime.  03/24/12   [provider]  pregabalin (LYRICA) 100 MG capsule Take 100-200 mg by mouth See admin instructions. Take 1 tablet (100 mg) by mouth every morning and 2 tablets (200 mg) at bedtime    [provider]  ranolazine (RANEXA) 1000 MG SR tablet Take 1 tablet (1,000 mg total) by mouth 2 (two) times daily. 01/21/19   Belva Crome, MD  terazosin (HYTRIN) 1 MG capsule Take 1 mg by mouth at bedtime.     [provider]  tiotropium (SPIRIVA) 18 MCG inhalation capsule Place 18 mcg into inhaler and inhale every morning.     [provider]  Ubrogepant (UBRELVY) 50 MG TABS Take 50 mg by mouth as needed. may repeat once based on response and tolerability after =2 hours. Maximum dose: 200 mg per 24 hours. 08/24/19   Frann Rider, NP     Family History  Problem Relation Age of Onset  . Aneurysm Mother        AAA  . Heart disease Mother        Heart Disease before age 39  . Other Mother        Carotid artery stenosis  . Hyperlipidemia Mother   . Hypertension Mother   . Lupus Sister   . Cancer Sister        Ovarian  . Heart disease Sister   . Heart disease Brother        Heart Disease before age 72  . Hypertension Brother   . Heart attack Brother   . Heart disease Brother        Before age 46  . Heart attack Brother   . Heart attack Brother   . Hyperlipidemia Father   . Hypertension Father   . Aneurysm Maternal Aunt        AAA  . Other Maternal Aunt        AAA  . Aneurysm Maternal Uncle        AAA  . Other Maternal  Uncle        AAA    Social History   Socioeconomic History  . Marital status: Married    Spouse name: reba  . Number of children: 2  . Years of education: 9  . Highest education level: Not on file  Occupational History  . Occupation: disability  Tobacco Use  . Smoking status: Former Smoker    Packs/day: 0.00    Years: 30.00     Pack years: 0.00    Types: Cigarettes    Quit date: 02/17/2001    Years since quitting: 18.5  . Smokeless tobacco: Never Used  Substance and Sexual Activity  . Alcohol use: Yes    Alcohol/week: 1.0 standard drinks    Types: 1 Cans of beer per week    Comment: occasional  . Drug use: No  . Sexual activity: Not Currently  Other Topics Concern  . Not on file  Social History Narrative   Patient lives at home with his wife.    Social Determinants of Health   Financial Resource Strain:   . Difficulty of Paying Living Expenses:   Food Insecurity:   . Worried About Charity fundraiser in the Last Year:   . Arboriculturist in the Last Year:   Transportation Needs:   . Film/video editor (Medical):   [name] Lack of Transportation (Non-Medical):   Physical Activity:   . Days of Exercise per Week:   . Minutes of Exercise per Session:   Stress:   . Feeling of Stress :   Social Connections:   . Frequency of Communication with Friends and Family:   . Frequency of Social Gatherings with Friends and Family:   . Attends Religious Services:   . Active Member of Clubs or Organizations:   . Attends Archivist Meetings:   [name] Marital Status:     Review of Systems  Review of Systems: A 12 point ROS discussed and pertinent positives are indicated in the HPI above.  All other systems are negative.  Physical Exam No direct physical exam was performed (except for noted visual exam findings with Video Visits).   Vital Signs: There were no vitals taken for this visit.  Imaging: MR ABDOMEN WWO CONTRAST  Result Date: 09/02/2019 CLINICAL DATA:  MRI of the abdomen cyst on the liver, history of cirrhosis. EXAM: MRI ABDOMEN WITHOUT AND WITH CONTRAST TECHNIQUE: Multiplanar multisequence MR imaging of the abdomen was performed both before and after the administration of intravenous contrast. CONTRAST:  69mL MULTIHANCE GADOBENATE DIMEGLUMINE 529 MG/ML IV SOLN COMPARISON:  MRI of 01/30/2019  FINDINGS: Lower chest: Median sternotomy with resultant susceptibility artifact in the midline lower chest. No consolidation or pleural effusion with limited assessment. Hepatobiliary: Liver displays a cirrhotic morphology. No biliary ductal dilation. Spleen is markedly enlarged as before. Accessory left hepatic artery arises from left gastric artery. Liver otherwise with classic hepatic arterial anatomy arising from celiac axis. No focal, suspicious hepatic lesion. Portal vein is patent. SMV and splenic veins are patent. Pancreas: Pancreas is normal without focal lesion or ductal dilation. Spleen:  Splenomegaly as above without focal lesion. Adrenals/Urinary Tract: Adrenal glands are normal. Symmetric renal enhancement. No evidence of hydronephrosis or suspicious renal lesion. Stomach/Bowel: No acute findings related to the gastrointestinal tract to the extent visualized. Pelvic bowel loops are not imaged. Vascular/Lymphatic: Aorto bi-iliac endograft placement with endo sac showing persistent but less enhancement related to known type 2 leak. This measures approximately 6.2 x 5.7 cm  as compared to 6.1 x 5.6 cm. Internal enhancement is best assessed on the subtraction images. Area of greatest enhancement approximately 2.1 cm (image 19 of series 22) compared to 2.6 cm (image 14 of series 19 on the prior study appearing less confluent in terms of the density associated with the contrast that fills the left posterolateral aspect of the aneurysm sac. Small amount of contrast tracking superiorly into the right from this area. Other:  No ascites. Musculoskeletal: No focal, suspicious bone lesion. IMPRESSION: 1. Aorto bi-iliac endograft placement with persistent but but diminished enhancement related to type 2 endoleak. 2. Stable caliber of the endo sac. 3. Cirrhosis with stigmata of portal venous hypertension including splenomegaly. No focal, suspicious hepatic lesion. Electronically Signed   By: Zetta Bills M.D.   On:  09/02/2019 14:56    Labs:  CBC: Recent Labs    11/10/18 1421 01/30/19 0415 01/31/19 1032 02/01/19 1132  WBC 7.6 4.2 4.5 4.1  HGB 15.4 14.2 14.7 14.3  HCT 44.6 41.6 43.3 42.1  PLT 100* 50* 63* 55*    COAGS: Recent Labs    01/30/19 0913  INR 1.2    BMP: Recent Labs    11/10/18 1421 01/30/19 0415 02/01/19 1132  NA 141 136 134*  K 4.6 3.5 4.0  CL 101 102 100  CO2 22 24 24   GLUCOSE 163* 166* 203*  BUN 18 10 14   CALCIUM 10.2 8.6* 9.0  CREATININE 1.09 0.73 0.91  GFRNONAA 72 >60 >60  GFRAA 84 >60 >60    LIVER FUNCTION TESTS: Recent Labs    02/01/19 1132  BILITOT 1.2  AST 31  ALT 35  ALKPHOS 55  PROT 6.1*  ALBUMIN 3.4*    TUMOR MARKERS: No results for input(s): AFPTM, CEA, CA199, CHROMGRNA in the last 8760 hours.  Assessment and Plan:  Mr. Brisco continues to do well 2-1/2 years status post direct sac puncture and liquid embolic endoleak repair.  His surveillance MRI which was performed on 09/02/2019 demonstrates a persistent small endoleak but a stable Endosac diameter.  No evidence of enlargement.  He is asymptomatic in regard to his aneurysm.  He denies back or flank pain.  He did undergo cataract surgery earlier this morning.  He is having no complications from that procedure.  We will continue active surveillance.  1.)  Repeat CT arteriogram of the abdomen and pelvis with accompanying clinic visit in 1 year.   Electronically Signed: Jacqulynn Cadet 09/15/2019, 11:21 AM   I spent a total of 10 Minutes in remote  clinical consultation, greater than 50% of which was counseling/coordinating care for endoleak s/p endovascular repair.    Visit type: Audio only (telephone). Audio (no video) only due to patient request. Alternative for in-person consultation at [facility], [address], [place], [place]. This visit type was conducted due to national recommendations for restrictions regarding the COVID-19 Pandemic (e.g. social distancing).   This format is felt to be most appropriate for this patient at this time.  All issues noted in this document were discussed and addressed.

## 2019-09-20 NOTE — Progress Notes (Signed)
Cardiology Office Note:    Date:  09/20/2019   ID:  Christian Mccall, DOB May 24, 1956, MRN [id number]  PCP:  Shon Baton, MD  Cardiologist:  Sinclair Grooms, MD   Referring MD: Shon Baton, MD   No chief complaint on file.   History of Present Illness:    Christian Mccall is a 64 y.o. male with a hx of  Coronary artery disease, coronary bypass grafting 2002, abdominal aortic aneurysm with endovascular repair and known endoleak2012, COPD, type 2 diabetes, prior myocardial infarction, history of CVA, obstructive sleep apnea, essential hypertension, diplopia, and hyperlipidemia.  [place] has angina from time to time.  Is been significantly better since adding long-acting nitrates.  He still overlay sublingual nitroglycerin.  He has not had syncope, palpitations, edema, or orthopnea.  He is hesitant to take the vaccine because "people are getting blood clots and dying".  No claudication.  No transient neurological symptoms.  Decrease in memory.  Past Medical History:  Diagnosis Date  . AAA (abdominal aortic aneurysm) ([place])   . Anginal pain ([place])   . Bulging lumbar disc    "& some in my neck"  . C. difficile colitis    2008 OR 09  . CAD (coronary artery disease)   . Change in platelet count    pt states 'has been low since 2012; patient had to receive platelets in surgery in Nov 2016'  . Chronic back pain   . Cirrhosis ([place] M[place])   . COPD (chronic obstructive pulmonary disease) ([place])   . Diabetic peripheral neuropathy (Christian Mccall)   . Emphysema lung (Christian Mccall)   . Enlarged liver   . Enlarged prostate   . GERD (gastroesophageal reflux disease)   . Hardening of the arteries of the brain   . Headache    "~ weekly" (04/12/2015)  . Heart attack [facility]) Nov. 15,2016   TIA  . History of colon polyps   . Hyperlipidemia   . Hypertension   . Leg pain    with walking and pain in feet while lying flat  . Memory loss    Due to hardening of the arteries in the brain  . Myocardial infarction (Christian Mccall)    "I've had probably 6-7" (04/12/2015)  . Restless leg syndrome   . Rheumatic fever    AS A CHILD  ?6 YRS OLD  . RLS (restless legs syndrome)   . Shortness of breath 12/06/10   while lying flat and with exertion  . Snoring   . Spleen enlarged   . Stroke [facility]) 2004   denies residual on 04/12/2015  . Thrombocytopenia (Christian Mccall)    since 2012; can be as low as 54K.   Christian Mccall TIA (transient ischemic attack) "several"   "since 2015" (04/12/2015)  . Type II diabetes mellitus ([place]) dx'd 2009    Past Surgical History:  Procedure Laterality Date  . ABDOMINAL AORTIC ANEURYSM REPAIR  01/16/2011   EVAR  . ABDOMINAL AORTIC ANEURYSM REPAIR  04/12/2015  . CARDIAC CATHETERIZATION  "several"  . COLONOSCOPY    . COLONOSCOPY W/ BIOPSIES    . COLONOSCOPY WITH PROPOFOL N/A 03/21/2016   Procedure: COLONOSCOPY WITH PROPOFOL;  Surgeon: Arta Silence, MD;  Location: [facility] ENDOSCOPY;  Service: Endoscopy;  Laterality: N/A;  . CORONARY ANGIOPLASTY WITH STENT PLACEMENT  "several"   > 4 sents  . CORONARY ARTERY BYPASS GRAFT  02/18/2001   "CABG x 4"  . Direct sac puncture with liquid embolic repair of Type II endoleak  04/12/2015; 03/05/2017  .  ESOPHAGOGASTRODUODENOSCOPY (EGD) WITH PROPOFOL N/A 03/21/2016   Procedure: ESOPHAGOGASTRODUODENOSCOPY (EGD) WITH PROPOFOL;  Surgeon: Arta Silence, MD;  Location[facility] ENDOSCOPY;  Service: Endoscopy;  Laterality: N/A;  . IR AORTAGRAM ABDOMINAL SERIALOGRAM  03/05/2017  . IR CT SPINE LTD  03/05/2017  . IR EMBO ARTERIAL NOT HEMORR HEMANG INC GUIDE ROADMAPPING  03/05/2017  . IR RADIOLOGIST EVAL & MGMT  02/05/2017  . IR RADIOLOGIST EVAL & MGMT  04/01/2017  . IR RADIOLOGIST EVAL & MGMT  10/01/2017  . IR RADIOLOGIST EVAL & MGMT  02/04/2019  . IR RADIOLOGIST EVAL & MGMT  09/15/2019  . LEFT HEART CATH AND CORS/GRAFTS ANGIOGRAPHY N/A 11/13/2018   Procedure: LEFT HEART CATH AND CORS/GRAFTS ANGIOGRAPHY;  Surgeon: Belva Crome, MD;  Location: [place] CV LAB;  Service: Cardiovascular;  Laterality: N/A;    . PERIPHERAL VASCULAR CATHETERIZATION N/A 03/10/2015   Procedure: Abdominal Aortogram;  Surgeon: Elam Dutch, MD;  Location: [place] CV LAB;  Service: Cardiovascular;  Laterality: N/A;  . RADIOLOGY WITH ANESTHESIA N/A 04/12/2015   Procedure: embolization;  Surgeon: Jacqulynn Cadet, MD;  Location: [place];  Service: Radiology;  Laterality: N/A;  . RADIOLOGY WITH ANESTHESIA N/A 03/05/2017   Procedure: Type II Endoleak Repair;  Surgeon: Jacqulynn Cadet, MD;  Location: [place];  Service: Radiology;  Laterality: N/A;  . SHOULDER SURGERY Right 07-27-2015  . TONSILLECTOMY  ~ 1965    Current Medications: No outpatient medications have been marked as taking for the 09/21/19 encounter (Appointment) with Belva Crome, MD.     Allergies:   Patient has no known allergies.   Social History   Socioeconomic History  . Marital status: Married    Spouse name: reba  . Number of children: 2  . Years of education: 9  . Highest education level: Not on file  Occupational History  . Occupation: disability  Tobacco Use  . Smoking status: Former Smoker    Packs/day: 0.00    Years: 30.00    Pack years: 0.00    Types: Cigarettes    Quit date: 02/17/2001    Years since quitting: 18.6  . Smokeless tobacco: Never Used  Substance and Sexual Activity  . Alcohol use: Yes    Alcohol/week: 1.0 standard drinks    Types: 1 Cans of beer per week    Comment: occasional  . Drug use: No  . Sexual activity: Not Currently  Other Topics Concern  . Not on file  Social History Narrative   Patient lives at home with his wife.    Social Determinants of Health   Financial Resource Strain:   . Difficulty of Paying Living Expenses:   Food Insecurity:   . Worried About Charity fundraiser in the Last Year:   . Arboriculturist in the Last Year:   Transportation Needs:   . Film/video editor (Medical):   Christian Mccall Lack of Transportation (Non-Medical):   Physical Activity:   . Days of Exercise per Week:   .  Minutes of Exercise per Session:   Stress:   . Feeling of Stress :   Social Connections:   . Frequency of Communication with Friends and Family:   . Frequency of Social Gatherings with Friends and Family:   . Attends Religious Services:   . Active Member of Clubs or Organizations:   . Attends Archivist Meetings:   Christian Mccall Marital Status:      Family History: The patient's family history includes Aneurysm in his maternal aunt,  maternal uncle, and mother; Cancer in his sister; Heart attack in his brother, brother, and brother; Heart disease in his brother, brother, mother, and sister; Hyperlipidemia in his father and mother; Hypertension in his brother, father, and mother; Lupus in his sister; Other in his maternal aunt, maternal uncle, and mother.  ROS:   Please see the history of present illness.    Decreased memory concerns him.  Physical deconditioning and sedentary lifestyle.  Does not believe he will get the COVID-19 vaccine.  Feels he had COVID-19 in January 2020.  He has an oximeter and notices that O2 saturation drops into the mid 80s with ambulation and heart rate increases above 110.  We discussed the connection between the 2.  O2 saturations decreased because of COPD plus minus combination with diastolic/systolic heart failure.  He denies orthopnea.  All other systems reviewed and are negative.  EKGs/Labs/Other Studies Reviewed:    The following studies were reviewed today: No new imaging data  EKG:  EKG demonstrates sinus rhythm, low voltage, poor R wave progression, and nonspecific T wave abnormality.  Compared to June 2020 low voltage is new.  Recent Labs: 02/01/2019: ALT 35; BUN 14; Creatinine, Ser 0.91; Hemoglobin 14.3; Platelets 55; Potassium 4.0; Sodium 134  Recent Lipid Panel    Component Value Date/Time   CHOL 103 01/30/2019 0412   TRIG 267 (H) 01/30/2019 0412   HDL 25 (L) 01/30/2019 0412   CHOLHDL 4.1 01/30/2019 0412   VLDL 53 (H) 01/30/2019 0412   LDLCALC  25 01/30/2019 0412    Physical Exam:    VS:  There were no vitals taken for this visit.    Wt Readings from Last 3 Encounters:  08/24/19 210 lb 6.4 oz (95.4 kg)  05/10/19 217 lb (98.4 kg)  03/03/19 211 lb (95.7 kg)     GEN: Moderate obesity without evidence of cyanosis. No acute distress HEENT: Normal NECK: No JVD. LYMPHATICS: No lymphadenopathy CARDIAC:  RRR without murmur, gallop, or edema. VASCULAR:  Normal Pulses. No bruits. RESPIRATORY:  Clear to auscultation without rales, wheezing or rhonchi  ABDOMEN: Soft, non-tender, non-distended, No pulsatile mass, MUSCULOSKELETAL: No deformity  SKIN: Warm and dry NEUROLOGIC:  Alert and oriented x 3 PSYCHIATRIC:  Normal affect   ASSESSMENT:    1. Chest pain of uncertain etiology   2. Coronary artery disease of bypass graft of native heart with stable angina pectoris ([place])   3. Essential (primary) hypertension   4. Type 2 diabetes mellitus with complication, without long-term current use of insulin ([place])   5. Hyperlipidemia, unspecified hyperlipidemia type   6. Abdominal aortic aneurysm (AAA) without rupture ([place])   7. TIA (transient ischemic attack)   8. Educated about COVID-19 virus infection    PLAN:    In order of problems listed above:  1. Improved with long-acting nitroglycerin 2. Secondary prevention discussed.  Diabetes is not well controlled.  Continue metoprolol, losartan, nitroglycerin patch, Ranexa rosuvastatin and as needed sublingual nitroglycerin. 3. Excellent blood pressure.  Continue current therapy which includes furosemide, losartan, and metoprolol. 4. Last hemoglobin A1c was 8.2 in March.  He benefits immensely from SGLT2 therapy. 5. Continue high intensity statin therapy.  Most recent LDL was 38 in November (rosuvastatin 20 mg/day 6. Followed by vascular medicine. 7. No new neurological events. 8. Vaccine hesitancy.  Continue social distancing and mask wearing.   Overall education and awareness  concerning primary/secondary risk prevention was discussed in detail: LDL less than 70, hemoglobin A1c less than 7, blood pressure  target less than 130/80 mmHg, >150 minutes of moderate aerobic activity per week, avoidance of smoking, weight control (via diet and exercise), and continued surveillance/management of/for obstructive sleep apnea. .   Medication Adjustments/Labs and Tests Ordered: Current medicines are reviewed at length with the patient today.  Concerns regarding medicines are outlined above.  No orders of the defined types were placed in this encounter.  No orders of the defined types were placed in this encounter.   There are no Patient Instructions on file for this visit.   Signed, Sinclair Grooms, MD  09/20/2019 11:03 PM    Lashawndra Lampkins

## 2019-09-21 ENCOUNTER — Other Ambulatory Visit: Payer: Self-pay

## 2019-09-21 ENCOUNTER — Ambulatory Visit: Payer: Medicare HMO | Admitting: Interventional Cardiology

## 2019-09-21 ENCOUNTER — Encounter: Payer: Self-pay | Admitting: Interventional Cardiology

## 2019-09-21 VITALS — BP 118/66 | HR 62 | Ht 71.0 in | Wt 212.1 lb

## 2019-09-21 DIAGNOSIS — E1151 Type 2 diabetes mellitus with diabetic peripheral angiopathy without gangrene: Secondary | ICD-10-CM | POA: Diagnosis not present

## 2019-09-21 DIAGNOSIS — I714 Abdominal aortic aneurysm, without rupture, unspecified: Secondary | ICD-10-CM

## 2019-09-21 DIAGNOSIS — R9431 Abnormal electrocardiogram [ECG] [EKG]: Secondary | ICD-10-CM

## 2019-09-21 DIAGNOSIS — E785 Hyperlipidemia, unspecified: Secondary | ICD-10-CM | POA: Diagnosis not present

## 2019-09-21 DIAGNOSIS — J432 Centrilobular emphysema: Secondary | ICD-10-CM | POA: Diagnosis not present

## 2019-09-21 DIAGNOSIS — R079 Chest pain, unspecified: Secondary | ICD-10-CM

## 2019-09-21 DIAGNOSIS — H2511 Age-related nuclear cataract, right eye: Secondary | ICD-10-CM | POA: Diagnosis not present

## 2019-09-21 DIAGNOSIS — E118 Type 2 diabetes mellitus with unspecified complications: Secondary | ICD-10-CM

## 2019-09-21 DIAGNOSIS — I25708 Atherosclerosis of coronary artery bypass graft(s), unspecified, with other forms of angina pectoris: Secondary | ICD-10-CM

## 2019-09-21 DIAGNOSIS — Z7189 Other specified counseling: Secondary | ICD-10-CM

## 2019-09-21 DIAGNOSIS — H25011 Cortical age-related cataract, right eye: Secondary | ICD-10-CM | POA: Diagnosis not present

## 2019-09-21 DIAGNOSIS — G459 Transient cerebral ischemic attack, unspecified: Secondary | ICD-10-CM | POA: Diagnosis not present

## 2019-09-21 DIAGNOSIS — I10 Essential (primary) hypertension: Secondary | ICD-10-CM

## 2019-09-21 NOTE — Patient Instructions (Signed)
Medication Instructions:  Your physician recommends that you continue on your current medications as directed. Please refer to the Current Medication list given to you today.  *If you need a refill on your cardiac medications before your next appointment, please call your pharmacy*   Lab Work: None If you have labs (blood work) drawn today and your tests are completely normal, you will receive your results only by: . MyChart Message (if you have MyChart) OR . A paper copy in the mail If you have any lab test that is abnormal or we need to change your treatment, we will call you to review the results.   Testing/Procedures: None   Follow-Up: At [facility], you and your health needs are our priority.  As part of our continuing mission to provide you with exceptional heart care, we have created designated Provider Care Teams.  These Care Teams include your primary Cardiologist (physician) and Advanced Practice Providers (APPs -  Physician Assistants and Nurse Practitioners) who all work together to provide you with the care you need, when you need it.  We recommend signing up for the patient portal called "MyChart".  Sign up information is provided on this After Visit Summary.  MyChart is used to connect with patients for Virtual Visits (Telemedicine).  Patients are able to view lab/test results, encounter notes, upcoming appointments, etc.  Non-urgent messages can be sent to your provider as well.   To learn more about what you can do with MyChart, go to [email or web address].    Your next appointment:   12 month(s)  The format for your next appointment:   In Person  Provider:   You may see Henry W Lloyd III, MD or one of the following Advanced Practice Providers on your designated Care Team:    Lori Gerhardt, NP  Laura Ingold, NP  Jill McDaniel, NP    Other Instructions   

## 2019-09-29 DIAGNOSIS — H25011 Cortical age-related cataract, right eye: Secondary | ICD-10-CM | POA: Diagnosis not present

## 2019-09-29 DIAGNOSIS — H25811 Combined forms of age-related cataract, right eye: Secondary | ICD-10-CM | POA: Diagnosis not present

## 2019-09-29 DIAGNOSIS — H2511 Age-related nuclear cataract, right eye: Secondary | ICD-10-CM | POA: Diagnosis not present

## 2019-10-08 DIAGNOSIS — J449 Chronic obstructive pulmonary disease, unspecified: Secondary | ICD-10-CM | POA: Diagnosis not present

## 2019-11-08 DIAGNOSIS — J449 Chronic obstructive pulmonary disease, unspecified: Secondary | ICD-10-CM | POA: Diagnosis not present

## 2019-11-11 DIAGNOSIS — Z03818 Encounter for observation for suspected exposure to other biological agents ruled out: Secondary | ICD-10-CM | POA: Diagnosis not present

## 2019-11-15 DIAGNOSIS — K7460 Unspecified cirrhosis of liver: Secondary | ICD-10-CM | POA: Diagnosis not present

## 2019-11-16 DIAGNOSIS — I8500 Esophageal varices without bleeding: Secondary | ICD-10-CM | POA: Diagnosis not present

## 2019-11-16 DIAGNOSIS — Z86010 Personal history of colonic polyps: Secondary | ICD-10-CM | POA: Diagnosis not present

## 2019-11-16 DIAGNOSIS — K648 Other hemorrhoids: Secondary | ICD-10-CM | POA: Diagnosis not present

## 2019-11-16 DIAGNOSIS — K766 Portal hypertension: Secondary | ICD-10-CM | POA: Diagnosis not present

## 2019-11-16 DIAGNOSIS — K3189 Other diseases of stomach and duodenum: Secondary | ICD-10-CM | POA: Diagnosis not present

## 2019-11-16 DIAGNOSIS — D123 Benign neoplasm of transverse colon: Secondary | ICD-10-CM | POA: Diagnosis not present

## 2019-11-16 DIAGNOSIS — K7460 Unspecified cirrhosis of liver: Secondary | ICD-10-CM | POA: Diagnosis not present

## 2019-11-16 DIAGNOSIS — R109 Unspecified abdominal pain: Secondary | ICD-10-CM | POA: Diagnosis not present

## 2019-11-16 DIAGNOSIS — D124 Benign neoplasm of descending colon: Secondary | ICD-10-CM | POA: Diagnosis not present

## 2019-11-17 DIAGNOSIS — H9312 Tinnitus, left ear: Secondary | ICD-10-CM | POA: Diagnosis not present

## 2019-11-17 DIAGNOSIS — H905 Unspecified sensorineural hearing loss: Secondary | ICD-10-CM | POA: Diagnosis not present

## 2019-11-17 DIAGNOSIS — H903 Sensorineural hearing loss, bilateral: Secondary | ICD-10-CM

## 2019-11-17 HISTORY — DX: Tinnitus, left ear: H93.12

## 2019-11-17 HISTORY — DX: Sensorineural hearing loss, bilateral: H90.3

## 2019-11-19 DIAGNOSIS — D124 Benign neoplasm of descending colon: Secondary | ICD-10-CM | POA: Diagnosis not present

## 2019-11-19 DIAGNOSIS — D123 Benign neoplasm of transverse colon: Secondary | ICD-10-CM | POA: Diagnosis not present

## 2019-12-06 ENCOUNTER — Ambulatory Visit: Payer: Medicare HMO | Admitting: Adult Health

## 2019-12-06 ENCOUNTER — Encounter: Payer: Self-pay | Admitting: Adult Health

## 2019-12-06 VITALS — BP 138/68 | HR 74 | Ht 71.0 in | Wt 209.0 lb

## 2019-12-06 DIAGNOSIS — Z8673 Personal history of transient ischemic attack (TIA), and cerebral infarction without residual deficits: Secondary | ICD-10-CM | POA: Diagnosis not present

## 2019-12-06 DIAGNOSIS — G43809 Other migraine, not intractable, without status migrainosus: Secondary | ICD-10-CM | POA: Diagnosis not present

## 2019-12-06 NOTE — Patient Instructions (Signed)
Will follow up in regards to further recommendations of your continued symptoms such as needed additional testing or be seen by our headache provider Dr. Jaynee Eagles - I will send a message through our [place] after discussion with Dr. Leonie Man        Thank you for coming to see [place] at [facility]. I hope we have been able to provide you high quality care today.  You may receive a patient satisfaction survey over the next few weeks. We would appreciate your feedback and comments so that we may continue to improve ourselves and the health of our patients.

## 2019-12-06 NOTE — Progress Notes (Signed)
[facility] [address] [place]. [place] [address] [phone]       OFFICE FOLLOW UP NOTE  [name] Date of Birth:  [birth year] Medical Record Number:  [id number]   Referring MD: Rosalin Hawking Reason for Referral: TIA vs complicated migraine  Chief complaint: Chief Complaint  Patient presents with  . Follow-up    migraine, treatment rm, with wife, pt states meds did not improve migraines       HPI:  Today, 12/06/2019, Christian Mccall returns for 9-month follow-up accompanied by his wife for complicated migraine versus hemiplegic migraine.  Trialed use of amitriptyline but self discontinued due to increased lethargy.  Has self discontinued Depakote as he felt as though medication with worsening headaches and since discontinued, headaches now occur 2-3 times monthly (on Depakote, reported 1 headache every 1 to 2 weeks).  He has experienced only 1 additional episode of left arm and leg weakness but headaches are consistently associated with left-sided facial weakness.  He questions migraine diagnosis as he only has mild headache and only lasts for 1 to 2 minutes prior to facial weakness onset.  Also trial use of Ubrevely without benefit.  Has also trialed use of Fioricet in the past without benefit.  Reports occasional imbalance which has been ongoing without worsening.  Remains on aspirin 81 mg daily and Crestor 20 mg daily for secondary stroke prevention.  Blood pressure today 138/68.  Glucose levels stable.  No further concerns.      History provided for reference purposes only Update 08/24/2019 JM: Christian Mccall is a 64 year old male who is being seen today, 08/24/2019, for migraine follow-up.  At prior visit, Depakote dosage increased from 500 mg nightly to 500 mg twice daily but endorses ongoing daily headaches and experiences complicated migraines with left facial paralysis approximately once every 1 to 2 weeks.  Daily headache and complicated migraine typically last  5-30 minutes and then subside and can occur multiple times throughout the day.  Denies photophobia, phonophobia or N/V.  He endorses daily headaches 8/10 pain bilaterally with pressure sensation.  When he experiences a migraine, his headache will start as his typical daily headache and then reports sensation as if "my brain is twisting" and then will experience left facial weakness.  He has not experienced left arm or leg symptoms.  He does not believe he has seen any benefit with use of Depakote and does report increasing tremors.  He did trial use of Fioricet once but did not provide benefit.  He also reports slowly progressing short-term memory and cognition impairment.  Continues on aspirin 81 mg daily for stroke prevention.  Blood pressure today 122/52.  No further concerns at this time.  Update 05/10/2019 PS ; he returns for follow-up after last visit 2 months ago.  He is accompanied by his wife.  He states he has not had any further episodes of complicated migraine in the form of vision difficulties, unsteady gait and left-sided weakness and numbness.  He however continues to have almost daily headaches.  The headaches can occur once or twice a day.  They are moderate to severe and often disabling he has to lie down.  Is not taking any symptomatic medications.  Is taking Depakote ER 5 mg at night is tolerating it well without any side effects.  He is unable to need for specific triggers.  He has had no new health problems.  He has no other new complaints.  Initial visit 03/03/2019 PS ; Christian Mccall is a pleasant 64 year old Caucasian male seen today for initial office consultation visit.  He is accompanied by his wife.  History is obtained from them and review of electronic medical records and I personally reviewed imaging films in PACS.  He has a past medical history of hypertension, hyperlipidemia and TIAs who presented to [facility] on 01/30/2019 with sudden onset of double vision and unsteady gait  and left face and arm weakness which lasted 5 to 10 minutes.  He states his episodes are still typical and have been occurring off and on for the last 10 to 15 years.  He states that it begins with sensation of drooping of his left eye followed by left face and then left arm and leg the whole episode evolves over a minute or so and last for about 10 minutes usually but may have lasted up to an hour and a few occasions.  He feels tired after the episode and needs to rest.  No specific trigger for these episodes and they occur at variable random frequency.  He is able to sit down when the episode is beginning and not hurting himself.  He feels if he is not sitting he will not be able to walk.  He usually waits for the episode to finish.  He does complain of mild headache during the episode but it is not severe or bothersome.  He feels at times that his brain is twisting in his head when the episode is occurring.  Patient in fact was evaluated by me in 2013 for these episodes which are felt to be small vessel disease TIAs.  He was subsequently lost to follow-up.  Patient now informs me today that his mother has also had similar episodes which are recurrent and stereotypical.  Patient denies any prior known history of migraine headaches or recurrent severe headaches which are disabling accompanying when light sound sensitivity.  During the last episode in August 2020 had MRI scan of the brain which showed no acute abnormality but did show a few remote his lacunar infarcts and moderate changes of chronic small vessel disease.  Transthoracic echo was unremarkable.  EEG was obtained which showed no epileptiform activity.  CT angiogram of the brain and neck showed 50% stenosis of bilateral internal carotid arteries and 50% stenosis at the origin of the right vertebral artery.  LDL cholesterol was low at 25 mg percent and hemoglobin A1c was elevated at 8.8.  Urine drug screen was negative.  Patient was started on dual  antiplatelet therapy initially but because of thrombocytopenia and low platelet count of 50,000 advised to take aspirin alone and stop Plavix.  Dr. Erlinda Hong who evaluated the patient in August felt episodes could be recurrent hemiplegic migraine and suggested a trial of verapamil but the patient has not taken that.      ROS:   14 system review of systems is positive for migraine, imbalance and all other systems negative  PMH:  Past Medical History:  Diagnosis Date  . AAA (abdominal aortic aneurysm) ([place])   . Anginal pain ([place])   . Bulging lumbar disc    "& some in my neck"  . C. difficile colitis    2008 OR 09  . CAD (coronary artery disease)   . Change in platelet count    pt states 'has been low since 2012; patient had to receive platelets in surgery in Nov 2016'  . Chronic back pain   . Cirrhosis ([place])   . COPD (  chronic obstructive pulmonary disease) ([place])   . Diabetic peripheral neuropathy ([place])   . Emphysema lung ([place])   . Enlarged liver   . Enlarged prostate   . GERD (gastroesophageal reflux disease)   . Hardening of the arteries of the brain   . Headache    "~ weekly" (04/12/2015)  . Heart attack [facility]) Nov. 15,2016   TIA  . History of colon polyps   . Hyperlipidemia   . Hypertension   . Leg pain    with walking and pain in feet while lying flat  . Memory loss    Due to hardening of the arteries in the brain  . Myocardial infarction ([place])    "I've had probably 6-7" (04/12/2015)  . Restless leg syndrome   . Rheumatic fever    AS A CHILD  ?5 YRS OLD  . RLS (restless legs syndrome)   . Shortness of breath 12/06/10   while lying flat and with exertion  . Snoring   . Spleen enlarged   . Stroke [facility]) 2004   denies residual on 04/12/2015  . Thrombocytopenia ([place])    since 2012; can be as low as 54K.   [name] TIA (transient ischemic attack) "several"   "since 2015" (04/12/2015)  . Type II diabetes mellitus ([place]) dx'd 2009    Social History:  Social History   Socioeconomic  History  . Marital status: Married    Spouse name: reba  . Number of children: 2  . Years of education: 9  . Highest education level: Not on file  Occupational History  . Occupation: disability  Tobacco Use  . Smoking status: Former Smoker    Packs/day: 0.00    Years: 30.00    Pack years: 0.00    Types: Cigarettes    Quit date: 02/17/2001    Years since quitting: 18.8  . Smokeless tobacco: Never Used  Vaping Use  . Vaping Use: Never used  Substance and Sexual Activity  . Alcohol use: Yes    Alcohol/week: 1.0 standard drink    Types: 1 Cans of beer per week    Comment: occasional  . Drug use: No  . Sexual activity: Not Currently  Other Topics Concern  . Not on file  Social History Narrative   Patient lives at home with his wife.    Social Determinants of Health   Financial Resource Strain:   . Difficulty of Paying Living Expenses:   Food Insecurity:   . Worried About Charity fundraiser in the Last Year:   . Arboriculturist in the Last Year:   Transportation Needs:   . Film/video editor (Medical):   [name] Lack of Transportation (Non-Medical):   Physical Activity:   . Days of Exercise per Week:   . Minutes of Exercise per Session:   Stress:   . Feeling of Stress :   Social Connections:   . Frequency of Communication with Friends and Family:   . Frequency of Social Gatherings with Friends and Family:   . Attends Religious Services:   . Active Member of Clubs or Organizations:   . Attends Archivist Meetings:   [name] Marital Status:   Intimate Partner Violence:   . Fear of Current or Ex-Partner:   . Emotionally Abused:   [name] Physically Abused:   . Sexually Abused:     Medications:   Current Outpatient Medications on File Prior to Visit  Medication Sig Dispense Refill  . albuterol (PROVENTIL HFA;VENTOLIN HFA)  108 (90 BASE) MCG/ACT inhaler Inhale 2 puffs into the lungs every 6 (six) hours as needed for wheezing or shortness of breath.     [name] aspirin EC 81  MG tablet Take 1 tablet (81 mg total) by mouth daily.    . Cholecalciferol (VITAMIN D) 2000 units tablet Take 2,000 Units by mouth 2 (two) times daily.     . clonazePAM (KLONOPIN) 1 MG tablet Take 1 mg by mouth 2 (two) times daily.     . cyanocobalamin (,VITAMIN B-12,) 1000 MCG/ML injection Inject 1,000 mcg into the muscle See admin instructions. Inject 1000 mcg intramuscularly once every 3 months    . empagliflozin (JARDIANCE) 10 MG TABS tablet Take 10 mg by mouth every morning.     . escitalopram (LEXAPRO) 20 MG tablet Take 20 mg by mouth at bedtime.    . fenofibrate 160 MG tablet Take 160 mg by mouth every morning.     . Fluticasone-Salmeterol (ADVAIR DISKUS) 100-50 MCG/DOSE AEPB Inhale 1 puff into the lungs 2 (two) times daily.     . furosemide (LASIX) 40 MG tablet Take 40 mg by mouth every other day.     [name] glimepiride (AMARYL) 4 MG tablet Take 4 mg by mouth daily.     . insulin glargine (LANTUS) 100 unit/mL SOPN Inject 24 Units into the skin at bedtime.     . lansoprazole (PREVACID) 30 MG capsule Take 30 mg by mouth every morning.   3  . losartan (COZAAR) 50 MG tablet Take 50 mg by mouth daily.    . metFORMIN (GLUCOPHAGE) 1000 MG tablet Take 1,000 mg by mouth daily with breakfast.     . metoprolol (LOPRESSOR) 50 MG tablet Take 50 mg by mouth 2 (two) times daily.     . nitroGLYCERIN (NITRODUR - DOSED IN MG/24 HR) 0.4 mg/hr patch Apply patch in the morning, remove at bedtime 30 patch 12  . nitroGLYCERIN (NITROSTAT) 0.4 MG SL tablet PLACE 1 TABLET UNDER THE TONGUE EVERY 5 MINUTES AS NEEDED FOR CHEST PAIN 25 tablet 4  . oxyCODONE-acetaminophen (PERCOCET) 5-325 MG tablet Take 1 tablet by mouth every 4 (four) hours as needed (max 6 q). 30 tablet 0  . OXYGEN Inhale into the lungs. 2L at night    . pramipexole (MIRAPEX) 0.25 MG tablet Take 0.25 mg by mouth at bedtime.     . pregabalin (LYRICA) 100 MG capsule Take 100-200 mg by mouth See admin instructions. Take 1 tablet (100 mg) by mouth every  morning and 2 tablets (200 mg) at bedtime    . ranolazine (RANEXA) 1000 MG SR tablet Take 1 tablet (1,000 mg total) by mouth 2 (two) times daily. 180 tablet 3  . rosuvastatin (CRESTOR) 20 MG tablet Take 20 mg by mouth daily.    [name] terazosin (HYTRIN) 1 MG capsule Take 1 mg by mouth at bedtime.     [name] tiotropium (SPIRIVA) 18 MCG inhalation capsule Place 18 mcg into inhaler and inhale every morning.      No current facility-administered medications on file prior to visit.    Allergies:  No Known Allergies  Physical Exam  Today's Vitals   12/06/19 0954  BP: 138/68  Pulse: 74  Weight: 209 lb (94.8 kg)  Height: 5\' 11"  (1.803 m)   Body mass index is 29.15 kg/m.  General: Mildly obese pleasant middle-aged Caucasian male, seated, in no evident distress Head: head normocephalic and atraumatic.   Neck: supple with no carotid or supraclavicular bruits Cardiovascular: regular rate  and rhythm, no murmurs Musculoskeletal: no deformity Skin:  no rash/petichiae Vascular:  Normal pulses all extremities  Neurologic Exam Mental Status: Awake and fully alert.  Fluent speech and language.  Oriented to place and time. Recent memory diminished and remote memory intact. Attention span, concentration and fund of knowledge mostly appropriate but would rely on his wife to answer certain questions. Mood and affect appropriate.  Cranial Nerves: Pupils equal, briskly reactive to light. Extraocular movements full without nystagmus. Visual fields full to confrontation. Hearing intact. Facial sensation intact. Face, tongue, palate moves normally and symmetrically.  Motor: Normal bulk and tone. Normal strength in all tested extremity muscles. Sensory.: intact to touch , pinprick , position   sensation.  Diminished vibration over toes bilaterally.   Coordination: Rapid alternating movements normal in all extremities. Finger-to-nose and heel-to-shin performed accurately bilaterally.   Gait and Station: Arises from  chair without difficulty. Stance is broad-based. Gait demonstrates normal stride length and mild imbalance.  Able to tandem walk without difficulty.  Able to stand on either foot unsupported without difficulty.  [place] mild swaying with eyes closed Reflexes: 1+ and symmetric. Toes downgoing.       ASSESSMENT/PLAN: 64 year old Caucasian male with recurrent transient episodes of left eye, face and body weakness possibly episodes of complicated migraine. He does have significant vascular risk factors of coronary artery disease status post stenting and surgery, AAA with endovascular repair and known endoleak 2012, MI, OSA hypertension, hyperlipidemia and diabetes with diabetic polyneuropathy and silent infarcts on MRI.    1.  Complicated migraine vs hemiplegic migraine  -Patient questions diagnosis due to only mild headache.  Discuss further with Dr. Leonie Man.  Due to longstanding history of similar episodes, complicated migraine/hemiplegic migraine likely the cause.  As he has a complicated cardiac history and polypharmacy, would recommend holding off on medication management due to risk versus benefit as episodes occur infrequently.  -Advised patient to call office with any worsening or increased frequency of episodes  -Failed/trialed: Amitriptyline, Depakote, Fioricet and Ubrevly     2.  History of stroke  -Continue aspirin 81 mg daily and Crestor 20 mg daily for secondary stroke prevention  -Continue to follow with PCP/cardiology for HTN, HLD and DM management   Follow-up in 6 months or call earlier if needed   I spent 35 minutes of face-to-face and non-face-to-face time with patient and wife.  This included previsit chart review, lab review, study review, order entry, electronic health record documentation, patient education regarding likely hemiplegic migraine, transient left-sided symptoms, prior stroke history, importance of managing stroke risk factors and answered all questions to patient  and wife satisfaction   Frann Rider, AGNP-BC  [facility] [address] [place], [place] [address]  Phone [phone] Fax [phone] Note: This document was prepared with digital dictation and possible smart phrase technology. Any transcriptional errors that result from this process are unintentional.

## 2019-12-07 ENCOUNTER — Encounter: Payer: Self-pay | Admitting: Adult Health

## 2019-12-07 NOTE — Progress Notes (Signed)
I agree with the above plan 

## 2019-12-08 ENCOUNTER — Ambulatory Visit: Payer: Medicare HMO | Admitting: Adult Health

## 2019-12-08 DIAGNOSIS — J449 Chronic obstructive pulmonary disease, unspecified: Secondary | ICD-10-CM | POA: Diagnosis not present

## 2020-01-04 DIAGNOSIS — E1151 Type 2 diabetes mellitus with diabetic peripheral angiopathy without gangrene: Secondary | ICD-10-CM | POA: Diagnosis not present

## 2020-01-04 DIAGNOSIS — I119 Hypertensive heart disease without heart failure: Secondary | ICD-10-CM | POA: Diagnosis not present

## 2020-01-04 DIAGNOSIS — K635 Polyp of colon: Secondary | ICD-10-CM | POA: Diagnosis not present

## 2020-01-04 DIAGNOSIS — H9190 Unspecified hearing loss, unspecified ear: Secondary | ICD-10-CM | POA: Diagnosis not present

## 2020-01-04 DIAGNOSIS — D692 Other nonthrombocytopenic purpura: Secondary | ICD-10-CM | POA: Diagnosis not present

## 2020-01-04 DIAGNOSIS — E11319 Type 2 diabetes mellitus with unspecified diabetic retinopathy without macular edema: Secondary | ICD-10-CM | POA: Diagnosis not present

## 2020-01-04 DIAGNOSIS — I2510 Atherosclerotic heart disease of native coronary artery without angina pectoris: Secondary | ICD-10-CM | POA: Diagnosis not present

## 2020-01-04 DIAGNOSIS — Z794 Long term (current) use of insulin: Secondary | ICD-10-CM | POA: Diagnosis not present

## 2020-01-04 DIAGNOSIS — R112 Nausea with vomiting, unspecified: Secondary | ICD-10-CM | POA: Diagnosis not present

## 2020-01-05 ENCOUNTER — Other Ambulatory Visit: Payer: Self-pay | Admitting: Internal Medicine

## 2020-01-05 DIAGNOSIS — R112 Nausea with vomiting, unspecified: Secondary | ICD-10-CM

## 2020-01-08 DIAGNOSIS — J449 Chronic obstructive pulmonary disease, unspecified: Secondary | ICD-10-CM | POA: Diagnosis not present

## 2020-01-17 ENCOUNTER — Ambulatory Visit
Admission: RE | Admit: 2020-01-17 | Discharge: 2020-01-17 | Disposition: A | Discharge status: Home / Self Care | Payer: Medicare HMO | Source: Ambulatory Visit | Attending: Internal Medicine | Admitting: Internal Medicine

## 2020-01-17 DIAGNOSIS — R112 Nausea with vomiting, unspecified: Secondary | ICD-10-CM

## 2020-01-17 DIAGNOSIS — K7689 Other specified diseases of liver: Secondary | ICD-10-CM | POA: Diagnosis not present

## 2020-02-02 ENCOUNTER — Other Ambulatory Visit: Payer: Self-pay | Admitting: Interventional Cardiology

## 2020-02-08 DIAGNOSIS — J449 Chronic obstructive pulmonary disease, unspecified: Secondary | ICD-10-CM | POA: Diagnosis not present

## 2020-03-09 DIAGNOSIS — J449 Chronic obstructive pulmonary disease, unspecified: Secondary | ICD-10-CM | POA: Diagnosis not present

## 2020-04-09 DIAGNOSIS — J449 Chronic obstructive pulmonary disease, unspecified: Secondary | ICD-10-CM | POA: Diagnosis not present

## 2020-04-24 DIAGNOSIS — E785 Hyperlipidemia, unspecified: Secondary | ICD-10-CM | POA: Diagnosis not present

## 2020-04-24 DIAGNOSIS — E559 Vitamin D deficiency, unspecified: Secondary | ICD-10-CM | POA: Diagnosis not present

## 2020-04-24 DIAGNOSIS — Z0000 Encounter for general adult medical examination without abnormal findings: Secondary | ICD-10-CM | POA: Diagnosis not present

## 2020-04-24 DIAGNOSIS — Z125 Encounter for screening for malignant neoplasm of prostate: Secondary | ICD-10-CM | POA: Diagnosis not present

## 2020-04-24 DIAGNOSIS — D519 Vitamin B12 deficiency anemia, unspecified: Secondary | ICD-10-CM | POA: Diagnosis not present

## 2020-04-24 DIAGNOSIS — I10 Essential (primary) hypertension: Secondary | ICD-10-CM | POA: Diagnosis not present

## 2020-04-24 DIAGNOSIS — E1129 Type 2 diabetes mellitus with other diabetic kidney complication: Secondary | ICD-10-CM | POA: Diagnosis not present

## 2020-05-01 DIAGNOSIS — J449 Chronic obstructive pulmonary disease, unspecified: Secondary | ICD-10-CM | POA: Diagnosis not present

## 2020-05-01 DIAGNOSIS — F325 Major depressive disorder, single episode, in full remission: Secondary | ICD-10-CM | POA: Diagnosis not present

## 2020-05-01 DIAGNOSIS — E1140 Type 2 diabetes mellitus with diabetic neuropathy, unspecified: Secondary | ICD-10-CM | POA: Diagnosis not present

## 2020-05-01 DIAGNOSIS — E1151 Type 2 diabetes mellitus with diabetic peripheral angiopathy without gangrene: Secondary | ICD-10-CM | POA: Diagnosis not present

## 2020-05-01 DIAGNOSIS — Z9119 Patient's noncompliance with other medical treatment and regimen: Secondary | ICD-10-CM | POA: Diagnosis not present

## 2020-05-01 DIAGNOSIS — F1120 Opioid dependence, uncomplicated: Secondary | ICD-10-CM | POA: Diagnosis not present

## 2020-05-01 DIAGNOSIS — E11319 Type 2 diabetes mellitus with unspecified diabetic retinopathy without macular edema: Secondary | ICD-10-CM | POA: Diagnosis not present

## 2020-05-01 DIAGNOSIS — G894 Chronic pain syndrome: Secondary | ICD-10-CM | POA: Diagnosis not present

## 2020-05-01 DIAGNOSIS — R82998 Other abnormal findings in urine: Secondary | ICD-10-CM | POA: Diagnosis not present

## 2020-05-01 DIAGNOSIS — Z0000 Encounter for general adult medical examination without abnormal findings: Secondary | ICD-10-CM | POA: Diagnosis not present

## 2020-05-01 DIAGNOSIS — K7469 Other cirrhosis of liver: Secondary | ICD-10-CM | POA: Diagnosis not present

## 2020-05-01 DIAGNOSIS — I119 Hypertensive heart disease without heart failure: Secondary | ICD-10-CM | POA: Diagnosis not present

## 2020-05-03 ENCOUNTER — Other Ambulatory Visit: Payer: Self-pay | Admitting: Gastroenterology

## 2020-05-03 DIAGNOSIS — K745 Biliary cirrhosis, unspecified: Secondary | ICD-10-CM

## 2020-05-09 DIAGNOSIS — J449 Chronic obstructive pulmonary disease, unspecified: Secondary | ICD-10-CM | POA: Diagnosis not present

## 2020-05-09 DIAGNOSIS — Z20828 Contact with and (suspected) exposure to other viral communicable diseases: Secondary | ICD-10-CM | POA: Diagnosis not present

## 2020-05-09 DIAGNOSIS — E1140 Type 2 diabetes mellitus with diabetic neuropathy, unspecified: Secondary | ICD-10-CM | POA: Diagnosis not present

## 2020-05-19 ENCOUNTER — Ambulatory Visit
Admission: RE | Admit: 2020-05-19 | Discharge: 2020-05-19 | Disposition: A | Discharge status: Home / Self Care | Payer: Medicare HMO | Source: Ambulatory Visit | Attending: Gastroenterology | Admitting: Gastroenterology

## 2020-05-19 DIAGNOSIS — K7689 Other specified diseases of liver: Secondary | ICD-10-CM | POA: Diagnosis not present

## 2020-05-19 DIAGNOSIS — K745 Biliary cirrhosis, unspecified: Secondary | ICD-10-CM

## 2020-05-25 DIAGNOSIS — Z1212 Encounter for screening for malignant neoplasm of rectum: Secondary | ICD-10-CM | POA: Diagnosis not present

## 2020-06-09 DIAGNOSIS — J449 Chronic obstructive pulmonary disease, unspecified: Secondary | ICD-10-CM | POA: Diagnosis not present

## 2020-07-10 DIAGNOSIS — J449 Chronic obstructive pulmonary disease, unspecified: Secondary | ICD-10-CM | POA: Diagnosis not present

## 2020-08-07 DIAGNOSIS — J449 Chronic obstructive pulmonary disease, unspecified: Secondary | ICD-10-CM | POA: Diagnosis not present

## 2020-08-28 ENCOUNTER — Encounter: Payer: Self-pay | Admitting: Neurology

## 2020-08-28 DIAGNOSIS — H532 Diplopia: Secondary | ICD-10-CM | POA: Diagnosis not present

## 2020-08-28 DIAGNOSIS — E113293 Type 2 diabetes mellitus with mild nonproliferative diabetic retinopathy without macular edema, bilateral: Secondary | ICD-10-CM | POA: Diagnosis not present

## 2020-08-28 DIAGNOSIS — H40013 Open angle with borderline findings, low risk, bilateral: Secondary | ICD-10-CM | POA: Diagnosis not present

## 2020-09-01 DIAGNOSIS — J449 Chronic obstructive pulmonary disease, unspecified: Secondary | ICD-10-CM | POA: Diagnosis not present

## 2020-09-01 DIAGNOSIS — I714 Abdominal aortic aneurysm, without rupture: Secondary | ICD-10-CM | POA: Diagnosis not present

## 2020-09-01 DIAGNOSIS — I25119 Atherosclerotic heart disease of native coronary artery with unspecified angina pectoris: Secondary | ICD-10-CM | POA: Diagnosis not present

## 2020-09-01 DIAGNOSIS — F1120 Opioid dependence, uncomplicated: Secondary | ICD-10-CM | POA: Diagnosis not present

## 2020-09-01 DIAGNOSIS — E11319 Type 2 diabetes mellitus with unspecified diabetic retinopathy without macular edema: Secondary | ICD-10-CM | POA: Diagnosis not present

## 2020-09-01 DIAGNOSIS — E1151 Type 2 diabetes mellitus with diabetic peripheral angiopathy without gangrene: Secondary | ICD-10-CM | POA: Diagnosis not present

## 2020-09-01 DIAGNOSIS — I739 Peripheral vascular disease, unspecified: Secondary | ICD-10-CM | POA: Diagnosis not present

## 2020-09-01 DIAGNOSIS — F325 Major depressive disorder, single episode, in full remission: Secondary | ICD-10-CM | POA: Diagnosis not present

## 2020-09-01 DIAGNOSIS — E1140 Type 2 diabetes mellitus with diabetic neuropathy, unspecified: Secondary | ICD-10-CM | POA: Diagnosis not present

## 2020-09-01 DIAGNOSIS — I119 Hypertensive heart disease without heart failure: Secondary | ICD-10-CM | POA: Diagnosis not present

## 2020-09-01 DIAGNOSIS — K7469 Other cirrhosis of liver: Secondary | ICD-10-CM | POA: Diagnosis not present

## 2020-09-01 DIAGNOSIS — Z794 Long term (current) use of insulin: Secondary | ICD-10-CM | POA: Diagnosis not present

## 2020-09-01 DIAGNOSIS — I2510 Atherosclerotic heart disease of native coronary artery without angina pectoris: Secondary | ICD-10-CM | POA: Diagnosis not present

## 2020-09-01 DIAGNOSIS — D692 Other nonthrombocytopenic purpura: Secondary | ICD-10-CM | POA: Diagnosis not present

## 2020-09-07 DIAGNOSIS — J449 Chronic obstructive pulmonary disease, unspecified: Secondary | ICD-10-CM | POA: Diagnosis not present

## 2020-09-08 ENCOUNTER — Other Ambulatory Visit: Payer: Self-pay | Admitting: Interventional Radiology

## 2020-09-08 DIAGNOSIS — I9789 Other postprocedural complications and disorders of the circulatory system, not elsewhere classified: Secondary | ICD-10-CM

## 2020-09-28 ENCOUNTER — Ambulatory Visit (HOSPITAL_COMMUNITY)
Admission: RE | Admit: 2020-09-28 | Discharge: 2020-09-28 | Disposition: A | Discharge status: Home / Self Care | Payer: Medicare HMO | Source: Ambulatory Visit | Attending: Interventional Radiology | Admitting: Interventional Radiology

## 2020-09-28 ENCOUNTER — Other Ambulatory Visit: Payer: Self-pay

## 2020-09-28 DIAGNOSIS — R188 Other ascites: Secondary | ICD-10-CM | POA: Diagnosis not present

## 2020-09-28 DIAGNOSIS — I9789 Other postprocedural complications and disorders of the circulatory system, not elsewhere classified: Secondary | ICD-10-CM | POA: Insufficient documentation

## 2020-09-28 LAB — POCT I-STAT CREATININE: Creatinine, Ser: 1 mg/dL (ref 0.61–1.24)

## 2020-09-28 MED ORDER — IOHEXOL 350 MG/ML SOLN
100.0000 mL | Freq: Once | INTRAVENOUS | Status: AC | PRN
  Administered 2020-09-28: 100 mL via INTRAVENOUS

## 2020-10-03 ENCOUNTER — Encounter: Payer: Self-pay | Admitting: *Deleted

## 2020-10-03 ENCOUNTER — Other Ambulatory Visit: Payer: Self-pay

## 2020-10-03 ENCOUNTER — Ambulatory Visit
Admission: RE | Admit: 2020-10-03 | Discharge: 2020-10-03 | Disposition: A | Discharge status: Home / Self Care | Payer: Medicare HMO | Source: Ambulatory Visit | Attending: Interventional Radiology | Admitting: Interventional Radiology

## 2020-10-03 DIAGNOSIS — I9789 Other postprocedural complications and disorders of the circulatory system, not elsewhere classified: Secondary | ICD-10-CM

## 2020-10-03 DIAGNOSIS — T82330D Leakage of aortic (bifurcation) graft (replacement), subsequent encounter: Secondary | ICD-10-CM | POA: Diagnosis not present

## 2020-10-03 HISTORY — PX: IR RADIOLOGIST EVAL & MGMT: IMG5224

## 2020-10-03 NOTE — Progress Notes (Signed)
Chief Complaint: Patient was seen in follow-up remotely today (TeleHealth) for type 2 endoleak at the request of Tangelia Sanson K.    Referring Physician(s): Dr. Ruta Hinds  History of Present Illness: Christian Mccall is a 65 y.o. male withhistory ofcirrhosis,CAD, MI, CVA, COPD, HLD, HTN, DM, and AAA with type II endoleak and previous repairin November of 2016.  Surveillance imaging demonstrated recurrent type II endoleak caudal to the prior endovascular repair. Despite having a stable size in the aneurysm sac, there was concern for outpouching in the left posterolateral aspect of the sacin the region of the new endoleak.  He underwent a direct sac puncture with endoleak repair using liquid embolic on 3/61/4431.Surveillance imaging since that time (most recently 09/28/20) has demonstrates no further enlargement of the excluded aneurysm.   He presents today forfollow-up evaluation.    Christian Mccall is currently feeling well.  He denies symptoms of abdominal or flank pain.  He has no chest pain or shortness of breath.    Past Medical History:  Diagnosis Date  . AAA (abdominal aortic aneurysm) ([place])   . Anginal pain ([place])   . Bulging lumbar disc    "& some in my neck"  . C. difficile colitis    2008 OR 09  . CAD (coronary artery disease)   . Change in platelet count    pt states 'has been low since 2012; patient had to receive platelets in surgery in Nov 2016'  . Chronic back pain   . Cirrhosis ([place])   . COPD (chronic obstructive pulmonary disease) ([place])   . Diabetic peripheral neuropathy ([place])   . Emphysema lung ([place])   . Enlarged liver   . Enlarged prostate   . GERD (gastroesophageal reflux disease)   . Hardening of the arteries of the brain   . Headache    "~ weekly" (04/12/2015)  . Heart attack [facility]) Nov. 15,2016   TIA  . History of colon polyps   . Hyperlipidemia   . Hypertension   . Leg pain    with walking and pain in feet while lying flat  .  Memory loss    Due to hardening of the arteries in the brain  . Myocardial infarction ([place])    "I've had probably 6-7" (04/12/2015)  . Restless leg syndrome   . Rheumatic fever    AS A CHILD  ?6 YRS OLD  . RLS (restless legs syndrome)   . Shortness of breath 12/06/10   while lying flat and with exertion  . Snoring   . Spleen enlarged   . Stroke [facility]) 2004   denies residual on 04/12/2015  . Thrombocytopenia ([place])    since 2012; can be as low as 54K.   [name] TIA (transient ischemic attack) "several"   "since 2015" (04/12/2015)  . Type II diabetes mellitus ([place]) dx'd 2009    Past Surgical History:  Procedure Laterality Date  . ABDOMINAL AORTIC ANEURYSM REPAIR  01/16/2011   EVAR  . ABDOMINAL AORTIC ANEURYSM REPAIR  04/12/2015  . CARDIAC CATHETERIZATION  "several"  . COLONOSCOPY    . COLONOSCOPY W/ BIOPSIES    . COLONOSCOPY WITH PROPOFOL N/A 03/21/2016   Procedure: COLONOSCOPY WITH PROPOFOL;  Surgeon: Arta Silence, MD;  Location[facility] ENDOSCOPY;  Service: Endoscopy;  Laterality: N/A;  . CORONARY ANGIOPLASTY WITH STENT PLACEMENT  "several"   > 4 sents  . CORONARY ARTERY BYPASS GRAFT  02/18/2001   "CABG x 4"  . Direct sac puncture with liquid embolic repair  of Type II endoleak  04/12/2015; 03/05/2017  . ESOPHAGOGASTRODUODENOSCOPY (EGD) WITH PROPOFOL N/A 03/21/2016   Procedure: ESOPHAGOGASTRODUODENOSCOPY (EGD) WITH PROPOFOL;  Surgeon: Arta Silence, MD;  Location: [facility] ENDOSCOPY;  Service: Endoscopy;  Laterality: N/A;  . IR AORTAGRAM ABDOMINAL SERIALOGRAM  03/05/2017  . IR CT SPINE LTD  03/05/2017  . IR EMBO ARTERIAL NOT HEMORR HEMANG INC GUIDE ROADMAPPING  03/05/2017  . IR RADIOLOGIST EVAL & MGMT  02/05/2017  . IR RADIOLOGIST EVAL & MGMT  04/01/2017  . IR RADIOLOGIST EVAL & MGMT  10/01/2017  . IR RADIOLOGIST EVAL & MGMT  02/04/2019  . IR RADIOLOGIST EVAL & MGMT  09/15/2019  . LEFT HEART CATH AND CORS/GRAFTS ANGIOGRAPHY N/A 11/13/2018   Procedure: LEFT HEART CATH AND CORS/GRAFTS ANGIOGRAPHY;   Surgeon: Belva Crome, MD;  Location: [place] CV LAB;  Service: Cardiovascular;  Laterality: N/A;  . PERIPHERAL VASCULAR CATHETERIZATION N/A 03/10/2015   Procedure: Abdominal Aortogram;  Surgeon: Elam Dutch, MD;  Location: [place] CV LAB;  Service: Cardiovascular;  Laterality: N/A;  . RADIOLOGY WITH ANESTHESIA N/A 04/12/2015   Procedure: embolization;  Surgeon: Jacqulynn Cadet, MD;  Location: [place];  Service: Radiology;  Laterality: N/A;  . RADIOLOGY WITH ANESTHESIA N/A 03/05/2017   Procedure: Type II Endoleak Repair;  Surgeon: Jacqulynn Cadet, MD;  Location: [place];  Service: Radiology;  Laterality: N/A;  . SHOULDER SURGERY Right 07-27-2015  . TONSILLECTOMY  ~ 1965    Allergies: Patient has no known allergies.  Medications: Prior to Admission medications   Medication Sig Start Date End Date Taking? Authorizing Provider  albuterol (PROVENTIL HFA;VENTOLIN HFA) 108 (90 BASE) MCG/ACT inhaler Inhale 2 puffs into the lungs every 6 (six) hours as needed for wheezing or shortness of breath.     [provider]  aspirin EC 81 MG tablet Take 1 tablet (81 mg total) by mouth daily. 03/22/16   Arta Silence, MD  Cholecalciferol (VITAMIN D) 2000 units tablet Take 2,000 Units by mouth 2 (two) times daily.     [provider]  clonazePAM (KLONOPIN) 1 MG tablet Take 1 mg by mouth 2 (two) times daily.     [provider]  cyanocobalamin (,VITAMIN B-12,) 1000 MCG/ML injection Inject 1,000 mcg into the muscle See admin instructions. Inject 1000 mcg intramuscularly once every 3 months    [provider]  empagliflozin (JARDIANCE) 10 MG TABS tablet Take 10 mg by mouth every morning.     [provider]  escitalopram (LEXAPRO) 20 MG tablet Take 20 mg by mouth at bedtime.    [provider]  fenofibrate 160 MG tablet Take 160 mg by mouth every morning.  11/03/10   [provider]  Fluticasone-Salmeterol (ADVAIR DISKUS) 100-50 MCG/DOSE  AEPB Inhale 1 puff into the lungs 2 (two) times daily.     [provider]  furosemide (LASIX) 40 MG tablet Take 40 mg by mouth every other day.     [provider]  glimepiride (AMARYL) 4 MG tablet Take 4 mg by mouth daily.     [provider]  insulin glargine (LANTUS) 100 unit/mL SOPN Inject 24 Units into the skin at bedtime.     [provider]  lansoprazole (PREVACID) 30 MG capsule Take 30 mg by mouth every morning.  03/11/14   [provider]  losartan (COZAAR) 50 MG tablet Take 50 mg by mouth daily.    [provider]  metFORMIN (GLUCOPHAGE) 1000 MG tablet Take 1,000 mg by mouth daily with  breakfast.     [provider]  metoprolol (LOPRESSOR) 50 MG tablet Take 50 mg by mouth 2 (two) times daily.     [provider]  nitroGLYCERIN (NITRODUR - DOSED IN MG/24 HR) 0.4 mg/hr patch Apply patch in the morning, remove at bedtime 12/07/18   Isaiah Serge, NP  nitroGLYCERIN (NITROSTAT) 0.4 MG SL tablet PLACE 1 TABLET UNDER THE TONGUE EVERY 5 MINUTES AS NEEDED FOR CHEST PAIN 08/27/19   Belva Crome, MD  oxyCODONE-acetaminophen (PERCOCET) 5-325 MG tablet Take 1 tablet by mouth every 4 (four) hours as needed (max 6 q). 03/19/18   Shuford, Olivia Mackie, PA-C  OXYGEN Inhale into the lungs. 2L at night    [provider]  pramipexole (MIRAPEX) 0.25 MG tablet Take 0.25 mg by mouth at bedtime.  03/24/12   [provider]  pregabalin (LYRICA) 100 MG capsule Take 100-200 mg by mouth See admin instructions. Take 1 tablet (100 mg) by mouth every morning and 2 tablets (200 mg) at bedtime    [provider]  ranolazine (RANEXA) 1000 MG SR tablet TAKE 1 TABLET (1,000 MG TOTAL) BY MOUTH 2 (TWO) TIMES DAILY. 02/02/20   Belva Crome, MD  rosuvastatin (CRESTOR) 20 MG tablet Take 20 mg by mouth daily. 09/01/19   [provider]  terazosin (HYTRIN) 1 MG capsule Take 1 mg by mouth at bedtime.     [provider]   tiotropium (SPIRIVA) 18 MCG inhalation capsule Place 18 mcg into inhaler and inhale every morning.     [provider]     Family History  Problem Relation Age of Onset  . Aneurysm Mother        AAA  . Heart disease Mother        Heart Disease before age 64  . Other Mother        Carotid artery stenosis  . Hyperlipidemia Mother   . Hypertension Mother   . Lupus Sister   . Cancer Sister        Ovarian  . Heart disease Sister   . Heart disease Brother        Heart Disease before age 59  . Hypertension Brother   . Heart attack Brother   . Heart disease Brother        Before age 25  . Heart attack Brother   . Heart attack Brother   . Hyperlipidemia Father   . Hypertension Father   . Aneurysm Maternal Aunt        AAA  . Other Maternal Aunt        AAA  . Aneurysm Maternal Uncle        AAA  . Other Maternal Uncle        AAA    Social History   Socioeconomic History  . Marital status: Married    Spouse name: reba  . Number of children: 2  . Years of education: 9  . Highest education level: Not on file  Occupational History  . Occupation: disability  Tobacco Use  . Smoking status: Former Smoker    Packs/day: 0.00    Years: 30.00    Pack years: 0.00    Types: Cigarettes    Quit date: 02/17/2001    Years since quitting: 19.6  . Smokeless tobacco: Never Used  Vaping Use  . Vaping Use: Never used  Substance and Sexual Activity  . Alcohol use: Yes    Alcohol/week: 1.0 standard drink    Types:  1 Cans of beer per week    Comment: occasional  . Drug use: No  . Sexual activity: Not Currently  Other Topics Concern  . Not on file  Social History Narrative   Patient lives at home with his wife.    Social Determinants of Health   Financial Resource Strain: Not on file  Food Insecurity: Not on file  Transportation Needs: Not on file  Physical Activity: Not on file  Stress: Not on file  Social Connections: Not on file    Review of Systems  Review of  Systems: A 12 point ROS discussed and pertinent positives are indicated in the HPI above.  All other systems are negative.  Physical Exam No direct physical exam was performed (except for noted visual exam findings with Video Visits).   Vital Signs: There were no vitals taken for this visit.  Imaging: CT Angio Abd/Pel w/ and/or w/o  Result Date: 09/29/2020 CLINICAL DATA:  65 year old with a history of endovascular repair of an abdominal aortic aneurysm and status post treatment of a type 2 endoleak. EXAM: CTA ABDOMEN AND PELVIS WITHOUT AND WITH CONTRAST TECHNIQUE: Multidetector CT imaging of the abdomen and pelvis was performed using the standard protocol during bolus administration of intravenous contrast. Multiplanar reconstructed images and MIPs were obtained and reviewed to evaluate the vascular anatomy. CONTRAST:  120mL OMNIPAQUE IOHEXOL 350 MG/ML SOLN COMPARISON:  09/21/2018 FINDINGS: VASCULAR Aorta: Endovascular repair of the abdominal aortic aneurysm with an infrarenal bifurcated aortic stent graft. The stent graft and limbs are patent. Stents extend into the common iliac arteries bilaterally. High-density material within the aneurysm sac is related to the Onyx embolization material. The aneurysm sac measures 6.0 x 5.7 cm on sequence 4, image 70 and previously measured 5.9 x 5.7. No clear evidence for an endoleak on the arterial or delayed images. Evaluation for endoleak is limited due to the artifact from the embolization material. Celiac: Greater than 50% stenosis at the origin of the celiac trunk is unchanged. Main branch vessels are patent without dissection or aneurysm. Again noted is a replaced left hepatic artery. SMA: Patent without evidence of aneurysm, dissection, vasculitis or significant stenosis. Renals: Single right renal artery is patent with mild atherosclerotic disease but no significant stenosis. There are 3 left renal arteries that are patent without significant stenosis and no  evidence for aneurysm or dissection involving the renal arteries. IMA: No significant flow identified in the proximal IMA. Inflow: Stent grafts terminate in the common iliac arteries bilaterally. Common, internal and external iliac arteries are patent bilaterally. No significant aneurysm or stenosis involving the iliac arteries. Proximal Outflow: Proximal femoral arteries are patent bilaterally. There is positive remodeling involving the right common femoral artery which is stable. Veins: Main portal vein appears to be patent. No gross abnormality to the IVC or renal veins. Review of the MIP images confirms the above findings. NON-VASCULAR Lower chest: Lung bases are clear. Hepatobiliary: Liver is diffusely nodular and compatible with cirrhosis. No evidence for a hepatic lesion. No gross abnormality to the gallbladder. No significant biliary dilatation. Pancreas: Unremarkable. No pancreatic ductal dilatation or surrounding inflammatory changes. Spleen: Enlarged spleen. Adrenals/Urinary Tract: Normal adrenal glands. Normal appearance of the kidneys. Normal appearance of the urinary bladder. Negative for kidney stones. Stomach/Bowel: Stomach is within normal limits. No evidence of bowel wall thickening, distention, or inflammatory changes. Lymphatic: Small lymph nodes in the gastrohepatic ligament region are similar to the previous examination. Otherwise, no significant abdominal or pelvic lymphadenopathy. Reproductive: No acute  abnormality to the prostate. Other: There may be trace pelvic ascites which is similar to the previous examination. Otherwise, no significant ascites. Stable high-density material posterior to left kidney is related to the previous endoleak repair procedure. Musculoskeletal: No acute bone abnormality. IMPRESSION: VASCULAR 1. Endovascular repair of the abdominal aortic aneurysm and status post endoleak repair. The abdominal aortic aneurysm sac has minimally changed in size since 2020, measuring  up to 6.0 cm and previously measured 5.9 cm. Limited evaluation for a subtle endoleak due to the streak artifact from the embolic material but no evidence for a large endoleak. 2. Stable stenosis involving the origin of the celiac trunk. NON-VASCULAR 1. Cirrhosis with splenomegaly. 2. No acute abnormality in the abdomen or pelvis. Electronically Signed   By: Markus Daft M.D.   On: 09/29/2020 09:00    Labs:  CBC: No results for input(s): WBC, HGB, HCT, PLT in the last 8760 hours.  COAGS: No results for input(s): INR, APTT in the last 8760 hours.  BMP: Recent Labs    09/28/20 1102  CREATININE 1.00    LIVER FUNCTION TESTS: No results for input(s): BILITOT, AST, ALT, ALKPHOS, PROT, ALBUMIN in the last 8760 hours.  TUMOR MARKERS: No results for input(s): AFPTM, CEA, CA199, CHROMGRNA in the last 8760 hours.  Assessment and Plan:  Continues to do well 3.5 years status post direct sac puncture and liquid embolic repair of type II endoleak.  No evidence of further enlargement of the excluded aneurysm sac.  Continue annual surveillance.  1.) Repeat CTA abdomen/pelvis and clinic visit in 1 year.    Electronically Signed: Criselda Peaches 10/03/2020, 10:38 AM   I spent a total of  10 Minutes in remote  clinical consultation, greater than 50% of which was counseling/coordinating care for type 2 endoleak.    Visit type: Audio only (telephone). Audio (no video) only due to patient preference. Alternative for in-person consultation at [facility], [address] [place], [place], [place]. This visit type was conducted due to national recommendations for restrictions regarding the COVID-19 Pandemic (e.g. social distancing).  This format is felt to be most appropriate for this patient at this time.  All issues noted in this document were discussed and addressed.

## 2020-10-07 DIAGNOSIS — J449 Chronic obstructive pulmonary disease, unspecified: Secondary | ICD-10-CM | POA: Diagnosis not present

## 2020-10-16 ENCOUNTER — Other Ambulatory Visit: Payer: Self-pay | Admitting: Interventional Cardiology

## 2020-10-16 DIAGNOSIS — I25708 Atherosclerosis of coronary artery bypass graft(s), unspecified, with other forms of angina pectoris: Secondary | ICD-10-CM

## 2020-11-07 DIAGNOSIS — J449 Chronic obstructive pulmonary disease, unspecified: Secondary | ICD-10-CM | POA: Diagnosis not present

## 2020-11-20 ENCOUNTER — Other Ambulatory Visit: Payer: Self-pay

## 2020-11-20 ENCOUNTER — Ambulatory Visit (INDEPENDENT_AMBULATORY_CARE_PROVIDER_SITE_OTHER): Payer: Medicare HMO | Admitting: Neurology

## 2020-11-20 ENCOUNTER — Encounter: Payer: Self-pay | Admitting: Neurology

## 2020-11-20 ENCOUNTER — Other Ambulatory Visit: Payer: Medicare HMO

## 2020-11-20 ENCOUNTER — Other Ambulatory Visit: Payer: Self-pay | Admitting: Neurology

## 2020-11-20 VITALS — BP 150/73 | HR 64 | Ht 71.0 in | Wt 210.0 lb

## 2020-11-20 DIAGNOSIS — H532 Diplopia: Secondary | ICD-10-CM

## 2020-11-20 DIAGNOSIS — G43409 Hemiplegic migraine, not intractable, without status migrainosus: Secondary | ICD-10-CM

## 2020-11-20 MED ORDER — PYRIDOSTIGMINE BROMIDE 60 MG PO TABS: ORAL_TABLET | ORAL | 0 refills | Status: DC

## 2020-11-20 NOTE — Progress Notes (Signed)
[facility] Clinic Note - Initial Visit   Date: 11/20/20  MEHTAAB MAYEDA MRN: [id number] DOB: 1955/06/22   Dear Dr. Darnelle Spangle:  Thank you for your kind referral of Christian Mccall for consultation of double vision. Although his history is well known to you, please allow [place] to reiterate it for the purpose of our medical record. The patient was accompanied to the clinic by wife who also provides collateral information.     History of Present Illness: Christian Mccall is a 65 y.o. right-handed male with diabetes mellitus complicated by neuropathy, CAD, AAA, COPD, GERD, hypertension, hyperlipidemia, and hemiplegic migraine presenting for evaluation of double vision. Starting in August 2020, he began having double vision.  Since this time, he tends to have double vision in the evening occurring about 3-4 times per week, usually worse after a busy day.  Objects are side-by-side.  He has closed one eye but does not recall if that helps.  He also has droopiness of the left eye, which then can move into the right eye.  No associated arm/leg weakness, difficulty swallowing/talking.    He has been diagnosed with hemiplegic migraine after presenting to the ER with left arm/leg weakness in the setting of headache in 01/2019. MRI brain did not show acute stroke.  He has been followed by Dr. Leonie Man at [facility] for this.  He gets weakness + headaches about 2-4 times per month, lasts about 15-30 min.    He has been on disability for cardiac reason for the past 20 years.  He has a Copywriter, advertising.   Out-side paper records, electronic medical record, and images have been reviewed where available and summarized as:  MRI brain wo contrast 01/30/2019: 1. No acute intracranial abnormality identified. 2. 5 mm focus of mildly increased diffusion signal within the subcortical white matter of the posterior right frontal region, which could reflect a small focus of artifact/T2 shine through or  possibly late subacute ischemia. Finding is not felt to be acute in nature. 3. Underlying moderate chronic microvascular ischemic disease with multiple remote lacunar infarcts involving the right caudate, progressed relative to 2015.  CT/A head and neck 8/222/2020: 1. Approximately 50% stenosis of the proximal right ICA secondary to prominent noncalcified plaque. 2. Atherosclerotic changes at the aortic arch and within the common carotid arteries bilaterally without significant stenosis. 3. Atherosclerotic calcifications within the cavernous ICA bilaterally. A 50% stenosis is present on the left. 4. Mild distal disease without a significant proximal stenosis, aneurysm, or branch vessel occlusion within the circle-of-Willis. 5. 50% stenosis at the origin of the right vertebral artery. 6. Mild degenerative changes of the cervical spine.    Lab Results  Component Value Date   HGBA1C 8.8 (H) 01/30/2019   Lab Results  Component Value Date   IWPYKDXI33 825 11/16/2013   Lab Results  Component Value Date   TSH 2.080 11/16/2013   Lab Results  Component Value Date   ESRSEDRATE 30 11/16/2013    Past Medical History:  Diagnosis Date   AAA (abdominal aortic aneurysm) ([place])    Anginal pain ([place])    Bulging lumbar disc    "& some in my neck"   C. difficile colitis    2008 OR 09   CAD (coronary artery disease)    Change in platelet count    pt states 'has been low since 2012; patient had to receive platelets in surgery in Nov 2016'   Chronic back pain    Cirrhosis ([place])  COPD (chronic obstructive pulmonary disease) (HCC)    Diabetic peripheral neuropathy (HCC)    Emphysema lung (HCC)    Enlarged liver    Enlarged prostate    GERD (gastroesophageal reflux disease)    Hardening of the arteries of the brain    Headache    "~ weekly" (04/12/2015)   Heart attack ([place]) Nov. 15,2016   TIA   History of colon polyps    Hyperlipidemia    Hypertension    Leg pain    with walking and  pain in feet while lying flat   Memory loss    Due to hardening of the arteries in the brain   Myocardial infarction ([place])    "I've had probably 6-7" (04/12/2015)   Restless leg syndrome    Rheumatic fever    AS A CHILD  ?5 YRS OLD   RLS (restless legs syndrome)    Shortness of breath 12/06/10   while lying flat and with exertion   Snoring    Spleen enlarged    Stroke ([place]) 2004   denies residual on 04/12/2015   Thrombocytopenia ([place])    since 2012; can be as low as 54K.    TIA (transient ischemic attack) "several"   "since 2015" (04/12/2015)   Type II diabetes mellitus ([place]) dx'd 2009    Past Surgical History:  Procedure Laterality Date   ABDOMINAL AORTIC ANEURYSM REPAIR  01/16/2011   EVAR   ABDOMINAL AORTIC ANEURYSM REPAIR  04/12/2015   CARDIAC CATHETERIZATION  "several"   COLONOSCOPY     COLONOSCOPY W/ BIOPSIES     COLONOSCOPY WITH PROPOFOL N/A 03/21/2016   Procedure: COLONOSCOPY WITH PROPOFOL;  Surgeon: Arta Silence, MD;  Location: [facility] ENDOSCOPY;  Service: Endoscopy;  Laterality: N/A;   CORONARY ANGIOPLASTY WITH STENT PLACEMENT  "several"   > 4 sents   CORONARY ARTERY BYPASS GRAFT  02/18/2001   "CABG x 4"   Direct sac puncture with liquid embolic repair of Type II endoleak  04/12/2015; 03/05/2017   ESOPHAGOGASTRODUODENOSCOPY (EGD) WITH PROPOFOL N/A 03/21/2016   Procedure: ESOPHAGOGASTRODUODENOSCOPY (EGD) WITH PROPOFOL;  Surgeon: Arta Silence, MD;  Location: [facility];  Service: Endoscopy;  Laterality: N/A;   IR AORTAGRAM ABDOMINAL SERIALOGRAM  03/05/2017   IR CT SPINE LTD  03/05/2017   IR EMBO ARTERIAL NOT HEMORR HEMANG INC GUIDE ROADMAPPING  03/05/2017   IR RADIOLOGIST EVAL & MGMT  02/05/2017   IR RADIOLOGIST EVAL & MGMT  04/01/2017   IR RADIOLOGIST EVAL & MGMT  10/01/2017   IR RADIOLOGIST EVAL & MGMT  02/04/2019   IR RADIOLOGIST EVAL & MGMT  09/15/2019   IR RADIOLOGIST EVAL & MGMT  10/03/2020   LEFT HEART CATH AND CORS/GRAFTS ANGIOGRAPHY N/A 11/13/2018   Procedure: LEFT  HEART CATH AND CORS/GRAFTS ANGIOGRAPHY;  Surgeon: Belva Crome, MD;  Location: [facility];  Service: Cardiovascular;  Laterality: N/A;   PERIPHERAL VASCULAR CATHETERIZATION N/A 03/10/2015   Procedure: Abdominal Aortogram;  Surgeon: Elam Dutch, MD;  Location: [facility];  Service: Cardiovascular;  Laterality: N/A;   RADIOLOGY WITH ANESTHESIA N/A 04/12/2015   Procedure: embolization;  Surgeon: Jacqulynn Cadet, MD;  Location: [facility];  Service: Radiology;  Laterality: N/A;   RADIOLOGY WITH ANESTHESIA N/A 03/05/2017   Procedure: Type II Endoleak Repair;  Surgeon: Jacqulynn Cadet, MD;  Location: [facility];  Service: Radiology;  Laterality: N/A;   SHOULDER SURGERY Right 07-27-2015   TONSILLECTOMY  ~ 1965     Medications:  Outpatient Encounter Medications  as of 11/20/2020  Medication Sig   albuterol (PROVENTIL HFA;VENTOLIN HFA) 108 (90 BASE) MCG/ACT inhaler Inhale 2 puffs into the lungs every 6 (six) hours as needed for wheezing or shortness of breath.    Ascorbic Acid (VITAMIN C) 500 MG CAPS Take 500 mg by mouth daily.   aspirin EC 81 MG tablet Take 1 tablet (81 mg total) by mouth daily.   Cholecalciferol (VITAMIN D) 2000 units tablet Take 2,000 Units by mouth 2 (two) times daily.    clonazePAM (KLONOPIN) 1 MG tablet Take 1 mg by mouth 2 (two) times daily.    cyanocobalamin (,VITAMIN B-12,) 1000 MCG/ML injection Inject 1,000 mcg into the muscle See admin instructions. Inject 1000 mcg intramuscularly once every 1 or 2 months   empagliflozin (JARDIANCE) 10 MG TABS tablet Take 10 mg by mouth every morning.    escitalopram (LEXAPRO) 20 MG tablet Take 20 mg by mouth at bedtime.   fenofibrate 160 MG tablet Take 160 mg by mouth every morning.    furosemide (LASIX) 40 MG tablet Take 40 mg by mouth every other day.    glimepiride (AMARYL) 4 MG tablet Take 4 mg by mouth daily.    insulin glargine (LANTUS) 100 unit/mL SOPN Inject 55 Units into the skin at bedtime.   lansoprazole (PREVACID)  30 MG capsule Take 30 mg by mouth every morning.    losartan (COZAAR) 50 MG tablet Take 50 mg by mouth daily.   metFORMIN (GLUCOPHAGE) 1000 MG tablet Take 1,000 mg by mouth daily with breakfast.    metoprolol (LOPRESSOR) 50 MG tablet Take 50 mg by mouth 2 (two) times daily.    nitroGLYCERIN (NITROSTAT) 0.4 MG SL tablet PLACE 1 TABLET UNDER THE TONGUE EVERY 5 MINUTES AS NEEDED FOR CHEST PAIN   oxyCODONE-acetaminophen (PERCOCET) 5-325 MG tablet Take 1 tablet by mouth every 4 (four) hours as needed (max 6 q).   OXYGEN Inhale into the lungs. 2L at night   pramipexole (MIRAPEX) 0.25 MG tablet Take 0.25 mg by mouth at bedtime.    pregabalin (LYRICA) 100 MG capsule Take 100 mg by mouth 2 (two) times daily. Per patient he takes at 1 am & 2 pm   pyridostigmine (MESTINON) 60 MG tablet Take 1 tablet at 5pm   ranolazine (RANEXA) 1000 MG SR tablet Take 1 tablet (1,000 mg total) by mouth 2 (two) times daily. Please make overdue appt with Dr. Tamala Julian before anymore refills. Thank you 1st attempt   rosuvastatin (CRESTOR) 20 MG tablet Take 20 mg by mouth daily.   terazosin (HYTRIN) 1 MG capsule Take 1 mg by mouth at bedtime.    [DISCONTINUED] Fluticasone-Salmeterol (ADVAIR) 100-50 MCG/DOSE AEPB Inhale 1 puff into the lungs 2 (two) times daily.  (Patient not taking: Reported on 11/20/2020)   [DISCONTINUED] nitroGLYCERIN (NITRODUR - DOSED IN MG/24 HR) 0.4 mg/hr patch Apply patch in the morning, remove at bedtime (Patient not taking: Reported on 11/20/2020)   [DISCONTINUED] tiotropium (SPIRIVA) 18 MCG inhalation capsule Place 18 mcg into inhaler and inhale every morning.  (Patient not taking: Reported on 11/20/2020)   No facility-administered encounter medications on file as of 11/20/2020.    Allergies: No Known Allergies  Family History: Family History  Problem Relation Age of Onset   Aneurysm Mother        AAA   Heart disease Mother        Heart Disease before age 73   Other Mother        Carotid artery  stenosis  Hyperlipidemia Mother    Hypertension Mother    Lupus Sister    Cancer Sister        Ovarian   Heart disease Sister    Heart disease Brother        Heart Disease before age 50   Hypertension Brother    Heart attack Brother    Heart disease Brother        Before age 45   Heart attack Brother    Heart attack Brother    Hyperlipidemia Father    Hypertension Father    Aneurysm Maternal Aunt        AAA   Other Maternal Aunt        AAA   Aneurysm Maternal Uncle        AAA   Other Maternal Uncle        AAA    Social History: Social History   Tobacco Use   Smoking status: Former    Packs/day: 0.00    Years: 30.00    Pack years: 0.00    Types: Cigarettes    Quit date: 02/17/2001    Years since quitting: 19.7   Smokeless tobacco: Never  Vaping Use   Vaping Use: Never used  Substance Use Topics   Alcohol use: Yes    Alcohol/week: 1.0 standard drink    Types: 1 Cans of beer per week    Comment: occasional   Drug use: No   Social History   Social History Narrative   Patient lives at home with his wife.    Right Handed    Lives in a one story motor home     Vital Signs:  BP (!) 150/73   Pulse 64   Ht 5\' 11"  (1.803 m)   Wt 210 lb (95.3 kg)   SpO2 96%   BMI 29.29 kg/m    Neurological Exam: MENTAL STATUS including orientation to time, place, person, recent and remote memory, attention span and concentration, language, and fund of knowledge is fairly intact.  Speech is not dysarthric.  CRANIAL NERVES: II:  No visual field defects.   III-IV-VI: Pupils equal round and reactive to light.  Normal conjugate, extra-ocular eye movements in all directions of gaze.  No nystagmus.  No ptosis.   V:  Normal facial sensation.    VII:  Normal facial symmetry and movements.   VIII:  Normal hearing and vestibular function.   IX-X:  Normal palatal movement.   XI:  Normal shoulder shrug and head rotation.   XII:  Normal tongue strength and range of motion, no  deviation or fasciculation.  MOTOR:  No atrophy, fasciculations or abnormal movements.  No pronator drift.   Upper Extremity:  Right  Left  Deltoid  5/5   5/5   Biceps  5/5   5/5   Triceps  5/5   5/5   Infraspinatus 5/5  5/5  Medial pectoralis 5/5  5/5  Wrist extensors  5/5   5/5   Wrist flexors  5/5   5/5   Finger extensors  5/5   5/5   Finger flexors  5/5   5/5   Dorsal interossei  5/5   5/5   Abductor pollicis  5/5   5/5   Tone ([place] scale)  0  0   Lower Extremity:  Right  Left  Hip flexors  5/5   5/5   Hip extensors  5/5   5/5   Adductor 5/5  5/5  Abductor 5/5  5/5  Knee flexors  5/5   5/5   Knee extensors  5/5   5/5   Dorsiflexors  5/5   5/5   Plantarflexors  5/5   5/5   Toe extensors  5/5   5/5   Toe flexors  5/5   5/5   Tone ([place] scale)  0  0   MSRs:  Right        Left                  brachioradialis 2+  2+  biceps 2+  2+  triceps 2+  2+  patellar 2+  2+  ankle jerk 0  0  Hoffman no  no  plantar response down  down   SENSORY:  Loss of vibration, temperature, and pin prick in the lower legs.  Sensation intact in the arms.  COORDINATION/GAIT: Normal finger-to- nose-finger.  Intact rapid alternating movements bilaterally.   Gait narrow based and stable. Mild unsteadiness with tandem gait, but able to perform.  IMPRESSION: Diplopia, ptosis with diurnal variation.  Need to evaluate for NMJ such as myasthenia gravis - Check AChR antibody panel - Start trial of mestinon 60mg  at 5pm to see if this helps - Consider single fiber EMG going forward Diabetic neuropathy manifesting with numbness in the feet and sensory ataxia - Encouraged tight diabetes management - Patient educated on daily foot inspection, fall prevention, and safety precautions around the home. - Install grab bars in the bathroom Hemiplegic migraine, episodic  - Does not warrant preventative therapy  Further recommendations pending results.    Thank you for allowing me to participate  in patient's care.  If I can answer any additional questions, I would be pleased to do so.    Sincerely,    Trevious Rampey K. Posey Pronto, DO

## 2020-11-20 NOTE — Patient Instructions (Addendum)
Start mestinon 60mg  at 5pm  Check lab

## 2020-11-25 LAB — MYASTHENIA GRAVIS PANEL 1
A CHR BINDING ABS: <0.3 nmol/L
STRIATED MUSCLE AB SCREEN: NEGATIVE

## 2020-11-29 NOTE — Progress Notes (Signed)
Patient is going to discuss this with wife and call back with an update.

## 2020-12-07 DIAGNOSIS — J449 Chronic obstructive pulmonary disease, unspecified: Secondary | ICD-10-CM | POA: Diagnosis not present

## 2021-01-02 DIAGNOSIS — Z794 Long term (current) use of insulin: Secondary | ICD-10-CM | POA: Diagnosis not present

## 2021-01-02 DIAGNOSIS — K7469 Other cirrhosis of liver: Secondary | ICD-10-CM | POA: Diagnosis not present

## 2021-01-02 DIAGNOSIS — J449 Chronic obstructive pulmonary disease, unspecified: Secondary | ICD-10-CM | POA: Diagnosis not present

## 2021-01-02 DIAGNOSIS — D692 Other nonthrombocytopenic purpura: Secondary | ICD-10-CM | POA: Diagnosis not present

## 2021-01-02 DIAGNOSIS — E11319 Type 2 diabetes mellitus with unspecified diabetic retinopathy without macular edema: Secondary | ICD-10-CM | POA: Diagnosis not present

## 2021-01-02 DIAGNOSIS — I119 Hypertensive heart disease without heart failure: Secondary | ICD-10-CM | POA: Diagnosis not present

## 2021-01-02 DIAGNOSIS — E1140 Type 2 diabetes mellitus with diabetic neuropathy, unspecified: Secondary | ICD-10-CM | POA: Diagnosis not present

## 2021-01-02 DIAGNOSIS — D519 Vitamin B12 deficiency anemia, unspecified: Secondary | ICD-10-CM | POA: Diagnosis not present

## 2021-01-02 DIAGNOSIS — I2510 Atherosclerotic heart disease of native coronary artery without angina pectoris: Secondary | ICD-10-CM | POA: Diagnosis not present

## 2021-01-07 DIAGNOSIS — J449 Chronic obstructive pulmonary disease, unspecified: Secondary | ICD-10-CM | POA: Diagnosis not present

## 2021-02-07 DIAGNOSIS — J449 Chronic obstructive pulmonary disease, unspecified: Secondary | ICD-10-CM | POA: Diagnosis not present

## 2021-02-14 ENCOUNTER — Other Ambulatory Visit: Payer: Self-pay | Admitting: Interventional Cardiology

## 2021-02-14 DIAGNOSIS — I25708 Atherosclerosis of coronary artery bypass graft(s), unspecified, with other forms of angina pectoris: Secondary | ICD-10-CM

## 2021-02-22 ENCOUNTER — Other Ambulatory Visit: Payer: Self-pay | Admitting: Gastroenterology

## 2021-02-22 DIAGNOSIS — K7469 Other cirrhosis of liver: Secondary | ICD-10-CM

## 2021-02-26 ENCOUNTER — Ambulatory Visit: Payer: Medicare HMO | Admitting: Neurology

## 2021-02-26 ENCOUNTER — Ambulatory Visit
Admission: RE | Admit: 2021-02-26 | Discharge: 2021-02-26 | Disposition: A | Discharge status: Home / Self Care | Payer: Medicare HMO | Source: Ambulatory Visit | Attending: Gastroenterology | Admitting: Gastroenterology

## 2021-02-26 ENCOUNTER — Other Ambulatory Visit: Payer: Self-pay | Admitting: Neurology

## 2021-02-26 ENCOUNTER — Encounter: Payer: Self-pay | Admitting: Neurology

## 2021-02-26 ENCOUNTER — Other Ambulatory Visit: Payer: Self-pay

## 2021-02-26 VITALS — BP 153/70 | HR 73 | Ht 71.0 in | Wt 206.0 lb

## 2021-02-26 DIAGNOSIS — Z0389 Encounter for observation for other suspected diseases and conditions ruled out: Secondary | ICD-10-CM | POA: Diagnosis not present

## 2021-02-26 DIAGNOSIS — R413 Other amnesia: Secondary | ICD-10-CM | POA: Diagnosis not present

## 2021-02-26 DIAGNOSIS — K7469 Other cirrhosis of liver: Secondary | ICD-10-CM

## 2021-02-26 DIAGNOSIS — H532 Diplopia: Secondary | ICD-10-CM | POA: Diagnosis not present

## 2021-02-26 NOTE — Patient Instructions (Addendum)
We will refer you for single fiber EMG at [place] 60mg  daily  Check labs  You have been referred for a neurocognitive evaluation (i.e., evaluation of memory and thinking abilities). Please bring someone with you to this appointment if possible, as it is helpful for the neuropsychologist to hear from both you and another adult who knows you well. Please bring eyeglasses and hearing aids if you wear them.    The evaluation will take approximately 3 hours and has two parts:   The first part is a clinical interview with the neuropsychologist, Dr. Melvyn Novas or Dr. Nicole Kindred. During the interview, the neuropsychologist will speak with you and the individual you brought to the appointment.    The second part of the evaluation is testing with the doctor's technician, aka psychometrician, Hinton Dyer or [employer]. During the testing, the technician will ask you to remember different types of material, solve problems, and answer some questionnaires. Your family member will not be present for this portion of the evaluation.   Please note: We have to reserve several hours of the neuropsychologist's time and the psychometrician's time for your evaluation appointment. As such, there is a No-Show fee of $100. If you are unable to attend any of your appointments, please contact our office as soon as possible to reschedule.

## 2021-02-26 NOTE — Progress Notes (Signed)
Follow-up Visit   Date: 02/26/21   Christian Mccall MRN: [id number] DOB: 12/11/1955   Interim History: Christian Mccall is a 65 y.o. right-handed Caucasian male with diabetes mellitus complicated by neuropathy, CAD, AAA, COPD, GERD, hypertension, hyperlipidemia, and hemiplegic migraine returning to the clinic for follow-up of double vision.  The patient was accompanied to the clinic by wife who also provides collateral information.    History of present illness: Starting in August 2020, he began having double vision.  Since this time, he tends to have double vision in the evening occurring about 3-4 times per week, usually worse after a busy day.  Objects are side-by-side.  He has closed one eye but does not recall if that helps.  He also has droopiness of the left eye, which then can move into the right eye.  No associated arm/leg weakness, difficulty swallowing/talking.     He has been diagnosed with hemiplegic migraine after presenting to the ER with left arm/leg weakness in the setting of headache in 01/2019. MRI brain did not show acute stroke.  He has been followed by Dr. Leonie Man at [facility] for this.  He gets weakness + headaches about 2-4 times per month, lasts about 15-30 min.    He has been on disability for cardiac reason for the past 20 years.  He has a Copywriter, advertising.   UPDATE 02/26/2021:  He continues to have double vision, which is worse with driving after several hours, or late at night when he is watching TV.  He tried mestinon a few times and felt that benefit varied, sometimes it helped, other times it did not.  When driving, his double vision lasts a few minutes, not constant. No weakness or difficulty swallowing/talking.   He complains of progressive problems with memory which is getting worse.  His wife manages medications and finances.  He is driving but admits to getting lost and has been advised to stop driving.     Medications:  Current Outpatient Medications on File  Prior to Visit  Medication Sig Dispense Refill   albuterol (PROVENTIL HFA;VENTOLIN HFA) 108 (90 BASE) MCG/ACT inhaler Inhale 2 puffs into the lungs every 6 (six) hours as needed for wheezing or shortness of breath.      Ascorbic Acid (VITAMIN C) 500 MG CAPS Take 500 mg by mouth daily.     aspirin EC 81 MG tablet Take 1 tablet (81 mg total) by mouth daily.     Cholecalciferol (VITAMIN D) 2000 units tablet Take 2,000 Units by mouth 2 (two) times daily.      clonazePAM (KLONOPIN) 1 MG tablet Take 1 mg by mouth 2 (two) times daily.      cyanocobalamin (,VITAMIN B-12,) 1000 MCG/ML injection Inject 1,000 mcg into the muscle See admin instructions. Inject 1000 mcg intramuscularly once every 1 or 2 months     empagliflozin (JARDIANCE) 10 MG TABS tablet Take 10 mg by mouth every morning.      escitalopram (LEXAPRO) 20 MG tablet Take 20 mg by mouth at bedtime.     fenofibrate 160 MG tablet Take 160 mg by mouth every morning.      furosemide (LASIX) 40 MG tablet Take 40 mg by mouth every other day.      glimepiride (AMARYL) 4 MG tablet Take 4 mg by mouth daily.      insulin glargine (LANTUS) 100 unit/mL SOPN Inject 55 Units into the skin at bedtime.     lansoprazole (PREVACID) 30 MG capsule  Take 30 mg by mouth every morning.   3   losartan (COZAAR) 50 MG tablet Take 50 mg by mouth daily.     metFORMIN (GLUCOPHAGE) 1000 MG tablet Take 1,000 mg by mouth daily with breakfast.      metoprolol (LOPRESSOR) 50 MG tablet Take 50 mg by mouth 2 (two) times daily.      nitroGLYCERIN (NITROSTAT) 0.4 MG SL tablet DISSOLVE 1 TABLET UNDER THE TONGUE EVERY 5 MINUTES AS NEEDED FOR CHEST PAIN (NEEDS OFFICE VISIT) 15 tablet 0   oxyCODONE-acetaminophen (PERCOCET) 5-325 MG tablet Take 1 tablet by mouth every 4 (four) hours as needed (max 6 q). 30 tablet 0   OXYGEN Inhale into the lungs. 2L at night     pramipexole (MIRAPEX) 0.25 MG tablet Take 0.25 mg by mouth at bedtime.      pregabalin (LYRICA) 100 MG capsule Take 100 mg by  mouth 2 (two) times daily. Per patient he takes at 1 am & 2 pm     pyridostigmine (MESTINON) 60 MG tablet Take 1 tablet at 5pm (Patient taking differently: Take 1 tablet at 5pm Patient takes PRN) 15 tablet 0   ranolazine (RANEXA) 1000 MG SR tablet Take 1 tablet (1,000 mg total) by mouth 2 (two) times daily. Please make overdue appt with Dr. Tamala Julian before anymore refills. Thank you 2nd attempt 30 tablet 0   rosuvastatin (CRESTOR) 20 MG tablet Take 20 mg by mouth daily.     terazosin (HYTRIN) 1 MG capsule Take 1 mg by mouth at bedtime.      No current facility-administered medications on file prior to visit.    Allergies: No Known Allergies  Vital Signs:  BP (!) 153/70   Pulse 73   Ht '5\' 11"'$  (1.803 m)   Wt 206 lb (93.4 kg)   SpO2 96%   BMI 28.73 kg/m   Neurological Exam: MENTAL STATUS including orientation to time, place, person, recent and remote memory, attention span and concentration, language, and fund of knowledge is normal.  Speech is not dysarthric.  CRANIAL NERVES:  No visual field defects.  Pupils equal round and reactive to light.  Normal conjugate, extra-ocular eye movements in all directions of gaze.  Bilateral mild ptosis, no worsening with sustained upgaze.  Face is symmetric. Facial muscles are 5/5 Palate elevates symmetrically.  Tongue is midline.  MOTOR:  Motor strength is 5/5 in all extremities.  No atrophy, fasciculations or abnormal movements.  No pronator drift.  Tone is normal.    MSRs:  Reflexes are 2+/4 throughout, except absent at the ankles.  SENSORY:  Vibration reduced at the ankles bilaterally. [name]  COORDINATION/GAIT:  Normal finger-to- nose-finger.  Intact rapid alternating movements bilaterally.  Gait mildly wide-based and stable, unassisted.   Data: MRI brain wo contrast 01/30/2019: 1. No acute intracranial abnormality identified. 2. 5 mm focus of mildly increased diffusion signal within the subcortical white matter of the posterior right frontal region,  which could reflect a small focus of artifact/T2 shine through or possibly late subacute ischemia. Finding is not felt to be acute in nature. 3. Underlying moderate chronic microvascular ischemic disease with multiple remote lacunar infarcts involving the right caudate, progressed relative to 2015.  CT/A head and neck 8/222/2020: 1. Approximately 50% stenosis of the proximal right ICA secondary to prominent noncalcified plaque. 2. Atherosclerotic changes at the aortic arch and within the common carotid arteries bilaterally without significant stenosis. 3. Atherosclerotic calcifications within the cavernous ICA bilaterally. A 50% stenosis is present on  the left. 4. Mild distal disease without a significant proximal stenosis, aneurysm, or branch vessel occlusion within the circle-of-Willis. 5. 50% stenosis at the origin of the right vertebral artery. 6. Mild degenerative changes of the cervical spine.  Labs 11/20/2020:  AChR negative  IMPRESSION/PLAN: Diplopia and ptosis with diurnal variation.  NMJ disorder remains high possibility, despite AChR being negative.  I will refer him for single fiber EMG for definitive diagnosis.  He may continue to use mestinon '60mg'$  daily at 5pm  Memory loss.  He is having difficulty with IADLs which raises concern for neurocognitive disorder.  Formal neuropsychological testing will be ordered.  Check TSH and vitamin B12 Diabetic neuropathy with numbness in the feet and sensory ataxia. Patient educated on daily foot inspection, fall prevention, and safety precautions around the home. Hemiplegic migraine, episodic.  Does not warrant preventative therapy  Further recommendations pending results.   Thank you for allowing me to participate in patient's care.  If I can answer any additional questions, I would be pleased to do so.    Sincerely,    Roston Grunewald K. Posey Pronto, DO

## 2021-02-27 LAB — TSH: TSH: 1.37 u[IU]/mL (ref 0.450–4.500)

## 2021-02-27 LAB — VITAMIN B12: Vitamin B-12: 826 pg/mL (ref 232–1245)

## 2021-03-09 DIAGNOSIS — J449 Chronic obstructive pulmonary disease, unspecified: Secondary | ICD-10-CM | POA: Diagnosis not present

## 2021-03-28 ENCOUNTER — Other Ambulatory Visit: Payer: Self-pay | Admitting: Interventional Cardiology

## 2021-03-29 ENCOUNTER — Other Ambulatory Visit: Payer: Self-pay | Admitting: Interventional Cardiology

## 2021-03-29 DIAGNOSIS — I25708 Atherosclerosis of coronary artery bypass graft(s), unspecified, with other forms of angina pectoris: Secondary | ICD-10-CM

## 2021-04-06 DIAGNOSIS — E119 Type 2 diabetes mellitus without complications: Secondary | ICD-10-CM | POA: Diagnosis not present

## 2021-04-06 DIAGNOSIS — Z961 Presence of intraocular lens: Secondary | ICD-10-CM | POA: Diagnosis not present

## 2021-04-06 DIAGNOSIS — H40013 Open angle with borderline findings, low risk, bilateral: Secondary | ICD-10-CM | POA: Diagnosis not present

## 2021-04-06 DIAGNOSIS — H532 Diplopia: Secondary | ICD-10-CM | POA: Diagnosis not present

## 2021-04-06 LAB — HM DIABETES EYE EXAM

## 2021-04-09 DIAGNOSIS — J449 Chronic obstructive pulmonary disease, unspecified: Secondary | ICD-10-CM | POA: Diagnosis not present

## 2021-04-21 ENCOUNTER — Other Ambulatory Visit: Payer: Self-pay | Admitting: Interventional Cardiology

## 2021-04-21 DIAGNOSIS — I25708 Atherosclerosis of coronary artery bypass graft(s), unspecified, with other forms of angina pectoris: Secondary | ICD-10-CM

## 2021-04-23 ENCOUNTER — Telehealth: Payer: Self-pay | Admitting: Interventional Cardiology

## 2021-04-23 MED ORDER — RANOLAZINE ER 1000 MG PO TB12: ORAL_TABLET | ORAL | 1 refills | Status: DC

## 2021-04-23 NOTE — Telephone Encounter (Signed)
Pt scheduled to see Dr. Tamala Julian 08/30/2021, sent in 90 day refill X's 1 to [facility].

## 2021-04-23 NOTE — Telephone Encounter (Signed)
*  STAT* If patient is at the pharmacy, call can be transferred to refill team.   1. Which medications need to be refilled? (please list name of each medication and dose if known) ranolazine (RANEXA) 1000 MG SR tablet  2. Which pharmacy/location (including street and city if local pharmacy) is medication to be sent to? [place], [place]  3. Do they need a 30 day or 90 day supply? 90 day   Patient has an appointment 08/20/2021

## 2021-05-15 ENCOUNTER — Other Ambulatory Visit: Payer: Self-pay | Admitting: Interventional Cardiology

## 2021-05-15 DIAGNOSIS — I25708 Atherosclerosis of coronary artery bypass graft(s), unspecified, with other forms of angina pectoris: Secondary | ICD-10-CM

## 2021-05-18 DIAGNOSIS — Z125 Encounter for screening for malignant neoplasm of prostate: Secondary | ICD-10-CM | POA: Diagnosis not present

## 2021-05-18 DIAGNOSIS — E11319 Type 2 diabetes mellitus with unspecified diabetic retinopathy without macular edema: Secondary | ICD-10-CM | POA: Diagnosis not present

## 2021-05-18 DIAGNOSIS — E559 Vitamin D deficiency, unspecified: Secondary | ICD-10-CM | POA: Diagnosis not present

## 2021-05-18 DIAGNOSIS — E538 Deficiency of other specified B group vitamins: Secondary | ICD-10-CM | POA: Diagnosis not present

## 2021-05-18 DIAGNOSIS — E1151 Type 2 diabetes mellitus with diabetic peripheral angiopathy without gangrene: Secondary | ICD-10-CM | POA: Diagnosis not present

## 2021-05-18 DIAGNOSIS — E1140 Type 2 diabetes mellitus with diabetic neuropathy, unspecified: Secondary | ICD-10-CM | POA: Diagnosis not present

## 2021-05-18 DIAGNOSIS — I119 Hypertensive heart disease without heart failure: Secondary | ICD-10-CM | POA: Diagnosis not present

## 2021-05-18 DIAGNOSIS — E785 Hyperlipidemia, unspecified: Secondary | ICD-10-CM | POA: Diagnosis not present

## 2021-05-25 DIAGNOSIS — F1120 Opioid dependence, uncomplicated: Secondary | ICD-10-CM | POA: Diagnosis not present

## 2021-05-25 DIAGNOSIS — R82998 Other abnormal findings in urine: Secondary | ICD-10-CM | POA: Diagnosis not present

## 2021-05-25 DIAGNOSIS — Z1212 Encounter for screening for malignant neoplasm of rectum: Secondary | ICD-10-CM | POA: Diagnosis not present

## 2021-05-25 DIAGNOSIS — E669 Obesity, unspecified: Secondary | ICD-10-CM | POA: Diagnosis not present

## 2021-05-25 DIAGNOSIS — I6529 Occlusion and stenosis of unspecified carotid artery: Secondary | ICD-10-CM | POA: Diagnosis not present

## 2021-05-25 DIAGNOSIS — E785 Hyperlipidemia, unspecified: Secondary | ICD-10-CM | POA: Diagnosis not present

## 2021-05-25 DIAGNOSIS — D696 Thrombocytopenia, unspecified: Secondary | ICD-10-CM | POA: Diagnosis not present

## 2021-05-25 DIAGNOSIS — E1151 Type 2 diabetes mellitus with diabetic peripheral angiopathy without gangrene: Secondary | ICD-10-CM | POA: Diagnosis not present

## 2021-05-25 DIAGNOSIS — I10 Essential (primary) hypertension: Secondary | ICD-10-CM | POA: Diagnosis not present

## 2021-05-25 DIAGNOSIS — Z0000 Encounter for general adult medical examination without abnormal findings: Secondary | ICD-10-CM | POA: Diagnosis not present

## 2021-05-25 DIAGNOSIS — G894 Chronic pain syndrome: Secondary | ICD-10-CM | POA: Diagnosis not present

## 2021-05-25 DIAGNOSIS — G4752 REM sleep behavior disorder: Secondary | ICD-10-CM | POA: Diagnosis not present

## 2021-05-25 DIAGNOSIS — Z23 Encounter for immunization: Secondary | ICD-10-CM | POA: Diagnosis not present

## 2021-07-10 DIAGNOSIS — J449 Chronic obstructive pulmonary disease, unspecified: Secondary | ICD-10-CM | POA: Diagnosis not present

## 2021-07-27 DIAGNOSIS — Z0100 Encounter for examination of eyes and vision without abnormal findings: Secondary | ICD-10-CM | POA: Diagnosis not present

## 2021-07-31 ENCOUNTER — Other Ambulatory Visit: Payer: Self-pay

## 2021-07-31 ENCOUNTER — Encounter: Payer: Self-pay | Admitting: Psychology

## 2021-07-31 ENCOUNTER — Ambulatory Visit: Payer: Medicare HMO | Admitting: Psychology

## 2021-07-31 ENCOUNTER — Ambulatory Visit (INDEPENDENT_AMBULATORY_CARE_PROVIDER_SITE_OTHER): Payer: Medicare HMO | Admitting: Psychology

## 2021-07-31 DIAGNOSIS — I6381 Other cerebral infarction due to occlusion or stenosis of small artery: Secondary | ICD-10-CM

## 2021-07-31 DIAGNOSIS — F688 Other specified disorders of adult personality and behavior: Secondary | ICD-10-CM

## 2021-07-31 DIAGNOSIS — I6781 Acute cerebrovascular insufficiency: Secondary | ICD-10-CM | POA: Diagnosis not present

## 2021-07-31 DIAGNOSIS — G3184 Mild cognitive impairment, so stated: Secondary | ICD-10-CM | POA: Diagnosis not present

## 2021-07-31 DIAGNOSIS — R4189 Other symptoms and signs involving cognitive functions and awareness: Secondary | ICD-10-CM

## 2021-07-31 DIAGNOSIS — G4752 REM sleep behavior disorder: Secondary | ICD-10-CM | POA: Insufficient documentation

## 2021-07-31 HISTORY — DX: Mild cognitive impairment of uncertain or unknown etiology: G31.84

## 2021-07-31 NOTE — Progress Notes (Addendum)
NEUROPSYCHOLOGICAL EVALUATION [place] Department of Neurology  Date of Evaluation: July 31, 2021  Reason for Referral:   Christian Mccall is a 66 y.o. right-handed Caucasian male referred by Narda Amber, D.O., to characterize his current cognitive functioning and assist with diagnostic clarity and treatment planning in the context of progressive memory decline and deficits with instrumental activities of daily living.   Assessment and Plan:   Clinical Impression(s): Christian Mccall pattern of performance is suggestive of an isolated impairment across phonemic fluency. He also exhibited some performance variability across executive functioning and aspects of verbal memory. Performances were appropriate across processing speed, attention/concentration, receptive language, semantic fluency, confrontation naming, visuospatial abilities, and visual memory. Despite relatively benign testing results, Christian Mccall and his wife noted ongoing impairment with ADLs. His wife has fully taken over medication and financial management and there has been several instances where Christian Mccall has gotten lost while driving. Current test scores do not arise to where a dementia diagnosis is warranted. As such, I believe he best meets criteria for a Mild Neurocognitive Disorder ("mild cognitive impairment") at the present time.  Regarding etiology, consideration should be given to a primary vascular culprit given his medical history (e.g., hypertension, coronary artery disease, type II diabetes, hyperlipidemia, and several lacunar infarctions). Recent neuroimaging also revealed progressive microvascular ischemic changes of moderate severity. Weaknesses in executive functioning, phonemic fluency, and memory are common in vascular etiologies, making this etiology quite plausible at the present time. These difficulties could certainly be exacerbated by his report of mild to moderate levels of  anxiety and depression occurring during the past 1-2 weeks.   Ongoing consideration should be given to frontotemporal dementia (FTD), particularly the behavioral variant, as his wife described personality changes surrounding significant irritability, anger, and trouble regulating his emotions. While some individuals with FTD can demonstrate generally intact cognitive performances across testing in early stages, his impairment in phonemic fluency would be more frontotemporally located. Additionally, domains he exhibited variability (i.e., executive functioning and verbal memory) are also frontally and temporally located. As FTD is a neurodegenerative illness, it would also make sense given his wife's report of notable ADL dysfunction despite benign test results. His relatively young age would also align with FTD presentations. Admittedly, recent imaging did not reveal advanced frontal and temporal lobe atrophy which would typically be expected.   Regarding Lewy body dementia, Christian Mccall did report some tremors and his wife reported ongoing REM sleep behaviors (i.e., hitting and kicking while asleep). However, visuospatial abilities were appropriate across testing, which would certainly be inconsistent with this presentation as this is the hallmark area of impairment early on in traditional cases. Despite some variability across memory measures, Christian Mccall retention percentages ranged from 88% to 100%. He also performed very well across yes/no recognition trials, making Alzheimer's disease very unlikely at the present time. It is worth highlighting that his medical history suggests a prior syphilis presentation and this ailment can be associated with cognitive decline and personality changes in more advanced stages. Continued medical monitoring will be important moving forward.  Recommendations: A repeat neuropsychological evaluation in 18 months (or sooner if functional decline is noted) is recommended to  assess the trajectory of future cognitive decline should it occur. This will also aid in future efforts towards improved diagnostic clarity.  Completion of an FDG-PET scan would be beneficial as patterns of metabolic abnormalities across this type of scan can be helpful at better differentiating between FTD and other  neurodegenerative illnesses. A lumbar puncture could also be considered to assess for protein abnormalities in the cerebrospinal fluid which could suggest one etiology versus another.   A combination of medication and psychotherapy has been shown to be most effective at treating symptoms of anxiety and depression. As such, Christian Mccall is encouraged to speak with his prescribing physician regarding medication adjustments to optimally manage these symptoms. He and his wife could specifically seek yo address behavioral dysregulation and irritability with medication intervention.  He will likely benefit from the establishment and maintenance of a routine in order to maximize his functional abilities over time.  It will be important for Christian Mccall to have another person with him when in situations where he may need to process information, weigh the pros and cons of different options, and make decisions, in order to ensure that he fully understands and recalls all information to be considered.  If not already done, Christian Mccall and his family may want to discuss his wishes regarding durable power of attorney and medical decision making, so that he can have input into these choices. Additionally, they may wish to discuss future plans for caretaking and seek out community options for in home/residential care should they become necessary.  Performance across neurocognitive testing is not a strong predictor of an individual's safety operating a motor vehicle. Should his family wish to pursue a formalized driving evaluation, they could reach out to the following agencies: [employer] in  [place]: [phone] Driver Rehabilitative Services: [facility]: [place]: [phone] or [phone]  Christian Mccall is encouraged to attend to lifestyle factors for brain health (e.g., regular physical exercise, good nutrition habits, regular participation in cognitively-stimulating activities, and general stress management techniques), which are likely to have benefits for both emotional adjustment and cognition. Optimal control of vascular risk factors (including safe cardiovascular exercise and adherence to dietary recommendations) is encouraged. Continued participation in activities which provide mental stimulation and social interaction is also recommended.   Memory can be improved using internal strategies such as rehearsal, repetition, chunking, mnemonics, association, and imagery. External strategies such as written notes in a consistently used memory journal, visual and nonverbal auditory cues such as a calendar on the refrigerator or appointments with alarm, such as on a cell phone, can also help maximize recall.    To address problems with fluctuating attention and executive dysfunction, he may wish to consider:   -Avoiding external distractions when needing to concentrate   -Limiting exposure to fast paced environments with multiple sensory demands   -Writing down complicated information and using checklists   -Attempting and completing one task at a time (i.e., no multi-tasking)   -Verbalizing aloud each step of a task to maintain focus   -Reducing the amount of information considered at one time  Review of Records:   Christian Mccall was seen by [facility] Narda Amber, D.O.) on 02/26/2021 for follow-up of double vision. Briefly, starting in August 2020, he began having double vision. Objects are side-by-side and he did not recall if closing one eye is helpful. He also described a left eye droop, which can then move into the right eye. He  denied associated arm/leg weakness or difficulty swallowing/talking. After presenting to the ED in August 2020 in the setting of arm/leg weakness and headache, he was diagnosed with hemiplegic migraine. Imaging did not show an acute stroke. When meeting with Dr. Posey Pronto most recently, he described continued double vision. He also described progressive memory decline.  His wife manages medications and finances and he has gotten lost while driving. Ultimately, Christian Mccall was referred for a comprehensive neuropsychological evaluation to characterize his cognitive abilities and to assist with diagnostic clarity and treatment planning.   Brain MRI on 01/21/2003 revealed microvascular ischemic changes of unspecified severity. Brain MRI on 01/13/2004 was stable. Brain MRI on 08/01/2011 was stable. Brain MRI on 12/13/2013 revealed moderate microvascular ischemic changes with slight progression relative to previous scans. Brain MRI on 01/30/2019 also revealed moderate and somewhat progressive ischemic changes, as well as a few remote lacunar infarcts in the right caudate head. There was also an area of increased diffusion in the posterior right frontal region.   Past Medical History:  Diagnosis Date   Abdominal aortic aneurysm without rupture    Anginal pain    Asymmetric SNHL (sensorineural hearing loss) 11/17/2019   Balanitis 11/30/2013   Bulging lumbar disc    "& some in my neck"   C. difficile colitis    2008 OR 09   CAD (coronary artery disease) of artery bypass graft 07/12/2015    Coronary angiogram 2010 :  CONCLUSION:  1. Bypass graft failure with occlusion of the SVG to the RCA.  2. Severe native disease with occlusion of the LAD and RCA.  3. Normal left ventricular function.  4. Patent saphenous vein grafts to the diagonal and to the OM #1.  The      left internal mammary artery graft is also patent.     Cervicalgia 11/12/2012   Change in platelet count    pt states 'has been low since 2012; patient had to  receive platelets in surgery in Nov 2016'   Chronic back pain    Cirrhosis    COPD (chronic obstructive pulmonary disease)    Diabetic peripheral neuropathy    Diplopia 01/30/2019   Dyspnea 02/04/2013   Emphysema lung    Endoleak of aortic graft    Enlarged liver    Enlarged prostate    Essential hypertension, benign 05/03/2013   Genital herpes simplex 12/03/2013   GERD (gastroesophageal reflux disease)    Headache    "~ weekly" (04/12/2015)   Hemiplegia affecting nondominant side 11/12/2012   History of colon polyps    Hyperlipidemia    Impingement syndrome of left shoulder region 02/18/2018   Left facial numbness 01/30/2019   Leg pain    with walking and pain in feet while lying flat   Lumbar back pain 11/12/2012   Multiple lacunar infarcts    REM sleep behaviors    Restless leg syndrome 11/12/2012   Rheumatic fever    AS A CHILD  ?6 YRS OLD   RLS (restless legs syndrome)    Shortness of breath 12/06/2010   while lying flat and with exertion   Snoring    Spleen enlarged    Syphilis, late latent 11/24/2013   Thrombocytopenia    since 2012; can be as low as 54K.    TIA (transient ischemic attack) "several"   "since 2015" (04/12/2015)   Tinnitus aurium, left 11/17/2019   Transient left leg weakness 11/12/2012   Type II diabetes mellitus 2009    Past Surgical History:  Procedure Laterality Date   ABDOMINAL AORTIC ANEURYSM REPAIR  01/16/2011   EVAR   ABDOMINAL AORTIC ANEURYSM REPAIR  04/12/2015   CARDIAC CATHETERIZATION  "several"   COLONOSCOPY     COLONOSCOPY W/ BIOPSIES     COLONOSCOPY WITH PROPOFOL N/A 03/21/2016   Procedure: COLONOSCOPY WITH PROPOFOL;  Surgeon: Arta Silence, MD;  Location: [facility] ENDOSCOPY;  Service: Endoscopy;  Laterality: N/A;   CORONARY ANGIOPLASTY WITH STENT PLACEMENT  "several"   > 4 sents   CORONARY ARTERY BYPASS GRAFT  02/18/2001   "CABG x 4"   Direct sac puncture with liquid embolic repair of Type II endoleak  04/12/2015; 03/05/2017    ESOPHAGOGASTRODUODENOSCOPY (EGD) WITH PROPOFOL N/A 03/21/2016   Procedure: ESOPHAGOGASTRODUODENOSCOPY (EGD) WITH PROPOFOL;  Surgeon: Arta Silence, MD;  Location: [place];  Service: Endoscopy;  Laterality: N/A;   IR AORTAGRAM ABDOMINAL SERIALOGRAM  03/05/2017   IR CT SPINE LTD  03/05/2017   IR EMBO ARTERIAL NOT HEMORR HEMANG INC GUIDE ROADMAPPING  03/05/2017   IR RADIOLOGIST EVAL & MGMT  02/05/2017   IR RADIOLOGIST EVAL & MGMT  04/01/2017   IR RADIOLOGIST EVAL & MGMT  10/01/2017   IR RADIOLOGIST EVAL & MGMT  02/04/2019   IR RADIOLOGIST EVAL & MGMT  09/15/2019   IR RADIOLOGIST EVAL & MGMT  10/03/2020   LEFT HEART CATH AND CORS/GRAFTS ANGIOGRAPHY N/A 11/13/2018   Procedure: LEFT HEART CATH AND CORS/GRAFTS ANGIOGRAPHY;  Surgeon: Belva Crome, MD;  Location: [place] [facility];  Service: Cardiovascular;  Laterality: N/A;   PERIPHERAL VASCULAR CATHETERIZATION N/A 03/10/2015   Procedure: Abdominal Aortogram;  Surgeon: Elam Dutch, MD;  Location: [place] [facility];  Service: Cardiovascular;  Laterality: N/A;   RADIOLOGY WITH ANESTHESIA N/A 04/12/2015   Procedure: embolization;  Surgeon: Jacqulynn Cadet, MD;  Location: [place];  Service: Radiology;  Laterality: N/A;   RADIOLOGY WITH ANESTHESIA N/A 03/05/2017   Procedure: Type II Endoleak Repair;  Surgeon: Jacqulynn Cadet, MD;  Location: [place];  Service: Radiology;  Laterality: N/A;   SHOULDER SURGERY Right 07-27-2015   TONSILLECTOMY  ~ 1965    Current Outpatient Medications:    albuterol (PROVENTIL HFA;VENTOLIN HFA) 108 (90 BASE) MCG/ACT inhaler, Inhale 2 puffs into the lungs every 6 (six) hours as needed for wheezing or shortness of breath. , Disp: , Rfl:    Ascorbic Acid (VITAMIN C) 500 MG CAPS, Take 500 mg by mouth daily., Disp: , Rfl:    aspirin EC 81 MG tablet, Take 1 tablet (81 mg total) by mouth daily., Disp: , Rfl:    Cholecalciferol (VITAMIN D) 2000 units tablet, Take 2,000 Units by mouth 2 (two) times daily. , Disp: , Rfl:    clonazePAM  (KLONOPIN) 1 MG tablet, Take 1 mg by mouth 2 (two) times daily. , Disp: , Rfl:    cyanocobalamin (,VITAMIN B-12,) 1000 MCG/ML injection, Inject 1,000 mcg into the muscle See admin instructions. Inject 1000 mcg intramuscularly once every 1 or 2 months, Disp: , Rfl:    empagliflozin (JARDIANCE) 10 MG TABS tablet, Take 10 mg by mouth every morning. , Disp: , Rfl:    escitalopram (LEXAPRO) 20 MG tablet, Take 20 mg by mouth at bedtime., Disp: , Rfl:    fenofibrate 160 MG tablet, Take 160 mg by mouth every morning. , Disp: , Rfl:    furosemide (LASIX) 40 MG tablet, Take 40 mg by mouth every other day. , Disp: , Rfl:    glimepiride (AMARYL) 4 MG tablet, Take 4 mg by mouth daily. , Disp: , Rfl:    insulin glargine (LANTUS) 100 unit/mL SOPN, Inject 55 Units into the skin at bedtime., Disp: , Rfl:    lansoprazole (PREVACID) 30 MG capsule, Take 30 mg by mouth every morning. , Disp: , Rfl: 3   losartan (COZAAR) 50 MG tablet,  Take 50 mg by mouth daily., Disp: , Rfl:    metFORMIN (GLUCOPHAGE) 1000 MG tablet, Take 1,000 mg by mouth daily with breakfast. , Disp: , Rfl:    metoprolol (LOPRESSOR) 50 MG tablet, Take 50 mg by mouth 2 (two) times daily. , Disp: , Rfl:    nitroGLYCERIN (NITROSTAT) 0.4 MG SL tablet, Place 1 tablet (0.4 mg total) under the tongue every 5 (five) minutes as needed for chest pain., Disp: 25 tablet, Rfl: 3   oxyCODONE-acetaminophen (PERCOCET) 5-325 MG tablet, Take 1 tablet by mouth every 4 (four) hours as needed (max 6 q)., Disp: 30 tablet, Rfl: 0   OXYGEN, Inhale into the lungs. 2L at night, Disp: , Rfl:    pramipexole (MIRAPEX) 0.25 MG tablet, Take 0.25 mg by mouth at bedtime. , Disp: , Rfl:    pregabalin (LYRICA) 100 MG capsule, Take 100 mg by mouth 2 (two) times daily. Per patient he takes at 1 am & 2 pm, Disp: , Rfl:    pyridostigmine (MESTINON) 60 MG tablet, Take 1 tablet at 5pm (Patient taking differently: Take 1 tablet at 5pm Patient takes PRN), Disp: 15 tablet, Rfl: 0   ranolazine  (RANEXA) 1000 MG SR tablet, TAKE 1 TABLET TWICE DAILY (OVERDUE FOR APPT, LAST REFILL, CALL [phone]), Disp: 90 tablet, Rfl: 1   rosuvastatin (CRESTOR) 20 MG tablet, Take 20 mg by mouth daily., Disp: , Rfl:    terazosin (HYTRIN) 1 MG capsule, Take 1 mg by mouth at bedtime. , Disp: , Rfl:   Clinical Interview:   The following information was obtained during a clinical interview with Christian Mccall and his wife prior to cognitive testing.  Cognitive Symptoms: Decreased short-term memory: Endorsed. Christian Mccall and his wife described fairly significant difficulties with generalized forgetfulness. They both highlighted that he has been getting lost, even when traversing familiar routes. Christian Mccall stated that while he might forget information quickly, a few days later, he is able to recall some details. Difficulties were said to be present for the past several years and have exhibited a progressive decline over that time.  Decreased long-term memory: Denied. Decreased attention/concentration: Endorsed. They reported trouble with sustained attention and increased distractibility.  Reduced processing speed: Denied. In fact, he described his mind as rapid at times to the extent where it can prevent him from falling asleep quickly.  Difficulties with executive functions: Endorsed. He reported trouble with organization, multi-tasking, and decision making. He and his wife noted some impulsivity in that he finds himself saying things that he immediately regrets saying. His wife also noted personality changes in that he has an extremely short-fuse and can become very irritable where it is quite difficult to manage his temper. She did not report outright aggressive actions towards herself or others.  Difficulties with emotion regulation: Endorsed (see above). Difficulties have been present for the past several years and have gradually worsened over time.  Difficulties with receptive language: Denied. Difficulties with  word finding: Endorsed. Decreased visuoperceptual ability: Denied.  Difficulties completing ADLs: Endorsed. His wife manages all medication, financial, and bill paying responsibilities. Mr. Swindell continues to drive short, local distances. Per Dr. Serita Grit most recent note, he has been advised to abstain from driving. As mentioned above, his wife described several instances where he has gotten lost even when driving on fairly familiar routes.   Additional Medical History: History of traumatic brain injury/concussion: Denied. History of stroke: Endorsed. Prior neuroimaging has revealed several lacunar infarctions in the right caudate head, as well  as another small area of diffusion in the posterior right frontal region.  History of seizure activity: Denied. History of known exposure to toxins: Denied. Symptoms of chronic pain: Denied. Experience of frequent headaches/migraines: Endorsed. He reported that he will "keep one most of the time." While he did not describe pain, he reported feeling "funny" and that he has a band of numbness covering the frontal and bilateral temporal regions. When symptoms are present, he reported sometimes exhibiting a left eye droop, as well as dizziness and lightheadedness. He also has increased trouble with attention/concentration.   Sensory changes: He described ongoing blurred vision which is improved but not fully corrected with glasses. He also reported ongoing hearing loss. He does not utilize hearing aids due to discomfort. Other sensory changes/difficulties (e.g., taste or smell) were denied.  Balance/coordination difficulties: Endorsed. He noted occasional instances where he will exhibit some balance instability. One side of the body was not said to be worse than the other. When describing these symptoms, he attributed them to a history of "mini-strokes" causing the left side of his body to exhibit some paralysis.  Other motor difficulties: Endorsed. He reported  tremors affecting his upper extremities sporadically. These are most commonly seen while performing actions with his hands. For example, he noted that he has been having trouble keeping food on his fork. Symptoms were said to be present for the past year or so and seemed to have worsened over time. He also described sporadic muscle jerking in his body but was unable to localize these symptoms to any degree. No alien-limb experiences were reported.   Sleep History: Estimated hours obtained each night: Variable. Difficulties falling asleep: Endorsed. He described difficulties surrounding having an overactive mind, as well as some anxiety, as primary causes for insomnia.  Difficulties staying asleep: Denied. Feels rested and refreshed upon awakening: Denied.  History of snoring: Endorsed. History of waking up gasping for air: Endorsed. Witnessed breath cessation while asleep: Denied. He reported having a laboratory sleep study in the past which revealed rest leg symptoms but no concern for obstructive sleep apnea.   History of vivid dreaming: Denied. Excessive movement while asleep: Endorsed. Instances of acting out his dreams: Endorsed. In addition to restless legs, his wife reported that she will often (i.e., several times per week) be accidentally hit or kicked while asleep.   Psychiatric/Behavioral Health History: Depression: He acknowledged a history of depressive symptoms in the past and acknowledged that certain reminders of these periods can cause him to acutely feel depressed. He described his current mood as "pretty good" and did not endorse acute depressive symptoms. Current or remote suicidal ideation, intent, or plan was denied.  Anxiety: Endorsed. Generalized anxiety symptoms have followed a similar pattern as depressive symptoms. He alluded to some mild anxiety symptoms presently when describing trouble falling asleep.  Mania: Denied. Trauma History: Denied. Visual/auditory  hallucinations: Denied. Delusional thoughts: Denied.  Tobacco: Denied. Alcohol: He reported rare alcohol consumption and denied a history of problematic alcohol abuse or dependence.  Recreational drugs: Denied.  Family History: Problem Relation Age of Onset   Aneurysm Mother        AAA   Heart disease Mother        Heart Disease before age 60   Other Mother        Carotid artery stenosis   Hyperlipidemia Mother    Hypertension Mother    Lupus Sister    Cancer Sister        Ovarian  Heart disease Sister    Heart disease Brother        Heart Disease before age 53   Hypertension Brother    Heart attack Brother    Heart disease Brother        Before age 58   Heart attack Brother    Heart attack Brother    Hyperlipidemia Father    Hypertension Father    Aneurysm Maternal Aunt        AAA   Other Maternal Aunt        AAA   Aneurysm Maternal Uncle        AAA   Other Maternal Uncle        AAA   This information was confirmed by Christian Mccall.  Academic/Vocational History: Highest level of educational attainment: 9 years. He left high school after completing the 9th grade to enter the work force. He described himself as a somewhat poor (C/D) student in academic settings prior to this. He described notable trouble with spelling and learning to read but did not endorse ever being formally diagnosed with a learning disability.  History of developmental delay: Denied. History of grade repetition: Denied. Enrollment in special education courses: Denied. History of LD/ADHD: Denied.  Employment: He currently receives disability benefits stemming from various cardiovascular and other medical ailments. 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   Evaluation Results:   Behavioral Observations: Christian Mccall was accompanied by his wife, arrived to his appointment on time, and was appropriately dressed and  groomed. He appeared alert and oriented. Observed gait and station were within normal limits. Gross motor functioning appeared intact upon informal observation and no abnormal movements (e.g., tremors) were noted during interview. He affect was generally relaxed and positive. Spontaneous speech was fluent and word finding difficulties were not observed during the clinical interview. Thought processes were coherent, organized, and normal in content. Insight into his cognitive difficulties appeared adequate. During testing, sustained attention was appropriate. Task engagement was adequate and he persisted when challenged. One task (D-KEFS Color-Word) was not attempted due to reported color blindness. Overall, Christian Mccall was cooperative with the clinical interview and subsequent testing procedures.   Adequacy of Effort: The validity of neuropsychological testing is limited by the extent to which the individual being tested may be assumed to have exerted adequate effort during testing. Christian Mccall expressed his intention to perform to the best of his abilities and exhibited adequate task engagement and persistence. Scores across stand-alone and embedded performance validity measures were within expectation. As such, the results of the current evaluation are believed to be a valid representation of Mr. Janik current cognitive functioning.  Test Results: Mr. Servantes was largely oriented at the time of the current evaluation. Points were lost for him being unable to state the correct date.  Intellectual abilities based upon educational and vocational attainment were estimated to be in the below average range. Premorbid abilities were estimated to be within the well below average range based upon a single-word reading test. However, his performance on a combination of subtests assessing perceptual reasoning abilities which strongly correlated with IQ estimations, scored in the below average range.    Processing speed  was below average to average. Basic attention was average. More complex attention (e.g., working memory) was below average. Executive functioning was mildly variable, ranging from the well below average to average normative ranges. He performed in the above average range  on a task assessing safety and judgment.  While not directly assessed, receptive language abilities were believed to be intact. Likewise, Mr. Shehadeh did not exhibit any difficulties comprehending task instructions and answered all questions asked of him appropriately. Assessed expressive language was mildly variable. Phonemic fluency was exceptionally low, while both semantic fluency and confrontation naming were average.     Assessed visuospatial/visuoconstructional abilities were average. Points were lost on his drawing of a clock due to incorrect hand placement.     Learning (i.e., encoding) of novel verbal and visual information was variable, ranging from the well below average to well above average normative ranges. Spontaneous delayed recall (i.e., retrieval) of previously learned information was also mildly variable, ranging from the below average to above average normative ranges. Retention rates were 91% across a story learning task, 100% across a list learning task, and 88% across a shape learning task. Performance across recognition tasks was well below average on a list learning task but average to above average across other tasks, suggesting evidence for information consolidation.   Results of emotional screening instruments suggested that recent symptoms of generalized anxiety were in the moderate range, while symptoms of depression were within the mild range. A screening instrument assessing recent sleep quality suggested the presence of moderate sleep dysfunction.  Tables of Scores:   Note: This summary of test scores accompanies the interpretive report and should not be considered in isolation without reference to the  appropriate sections in the text. Descriptors are based on appropriate normative data and may be adjusted based on clinical judgment. Terms such as "Within Normal Limits" and "Outside Normal Limits" are used when a more specific description of the test score cannot be determined.       Percentile - Normative Descriptor > 98 - Exceptionally High 91-97 - Well Above Average 75-90 - Above Average 25-74 - Average 9-24 - Below Average 2-8 - Well Below Average < 2 - Exceptionally Low       Validity:   DESCRIPTOR       ACS Word Choice: --- --- Within Normal Limits  Dot Counting Test: --- --- Within Normal Limits  NAB EVI: --- --- Within Normal Limits       Orientation:      Raw Score Percentile   NAB Orientation, Form 1 27/29 --- ---       Cognitive Screening:      Raw Score Percentile   SLUMS: 21/30 --- ---       Intellectual Functioning:      Standard Score Percentile   Test of Premorbid Functioning: 46 4 Well Below Average       Wechsler Adult Intelligence Scale (WAIS-IV):  Standard Score/ Scaled Score Percentile   Perceptual Reasoning Index:  84 14 Below Average    Block Design  8 25 Average    Matrix Reasoning  5 5 Well Below Average    Visual Puzzles 9 37 Average       Memory:     NAB Memory Module, Form 1: Standard Score/ T Score Percentile   Total Memory Index 93 32 Average  List Learning       Total Trials 1-3 11/36 (35) 7 Well Below Average    List B 3/12 (47) 38 Average    Short Delay Free Recall 3/12 (38) 12 Below Average    Long Delay Free Recall 3/12 (39) 14 Below Average    Retention Percentage 100 (51) 54 Average    Recognition Discriminability 2 (32)  4 Well Below Average  Shape Learning       Total Trials 1-3 20/27 (65) 93 Well Above Average    Delayed Recall 7/9 (62) 88 Above Average    Retention Percentage 88 (47) 38 Average    Recognition Discriminability 7 (52) 58 Average  Story Learning       Immediate Recall 41/80 (40) 16 Below Average    Delayed  Recall 20/40 (40) 16 Below Average    Retention Percentage 91 (49) 46 Average  Daily Living Memory       Immediate Recall 41/51 (54) 66 Average    Delayed Recall 14/17 (54) 66 Average    Retention Percentage 93 (55) 69 Average    Recognition Hits 10/10 (60) 84 Above Average       Attention/Executive Function:     Trail Making Test (TMT): Raw Score (T Score) Percentile     Part A 50 secs.,  0 errors (41) 18 Below Average    Part B 115 secs.,  0 errors (45) 31 Average         Scaled Score Percentile   WAIS-IV Coding: 6 9 Below Average       NAB Attention Module, Form 1: T Score Percentile     Digits Forward 46 34 Average    Digits Backwards 41 18 Below Average        Scaled Score Percentile   WAIS-IV Similarities: 8 25 Average       D-KEFS Verbal Fluency Test: Raw Score (Scaled Score) Percentile     Letter Total Correct 11 (3) 1 Exceptionally Low    Category Total Correct 32 (9) 37 Average    Category Switching Total Correct 9 (6) 9 Below Average    Category Switching Accuracy 8 (7) 16 Below Average      Total Set Loss Errors 1 (11) 63 Average      Total Repetition Errors 0 (13) 84 Above Average       NAB Executive Functions Module, Form 1: T Score Percentile     Judgment 59 82 Above Average       Language:     Verbal Fluency Test: Raw Score (T Score) Percentile     Phonemic Fluency (FAS) 11 (24) <1 Exceptionally Low    Animal Fluency 20 (54) 66 Average        NAB Language Module, Form 1: T Score Percentile     Naming 30/31 (55) 69 Average       Visuospatial/Visuoconstruction:      Raw Score Percentile   Clock Drawing: 8/10 --- Within Normal Limits       NAB Spatial Module, Form 1: T Score Percentile     Figure Drawing Copy 52 58 Average        Scaled Score Percentile   WAIS-IV Block Design: 8 25 Average  WAIS-IV Matrix Reasoning: 5 5 Well Below Average  WAIS-IV Visual Puzzles: 9 37 Average       Mood and Personality:      Raw Score Percentile   Beck  Depression Inventory - II: 17 --- Mild  PROMIS Anxiety Questionnaire: 25 --- Moderate       Additional Questionnaires:      Raw Score Percentile   PROMIS Sleep Disturbance Questionnaire: 37 --- Moderate   Informed Consent and Coding/Compliance:   The current evaluation represents a clinical evaluation for the purposes previously outlined by the referral source and is in no way reflective of a forensic evaluation.   Mr.  Rodriquez was provided with a verbal description of the nature and purpose of the present neuropsychological evaluation. Also reviewed were the foreseeable risks and/or discomforts and benefits of the procedure, limits of confidentiality, and mandatory reporting requirements of this provider. The patient was given the opportunity to ask questions and receive answers about the evaluation. Oral consent to participate was provided by the patient.   This evaluation was conducted by Christia Reading, Ph.D., ABPP-CN, board certified clinical neuropsychologist. Mr. Morais completed a clinical interview with Dr. Melvyn Novas, billed as one unit [phone], and 135 minutes of cognitive testing and scoring, billed as one unit [phone] and four additional units [id number]. Psychometrist Milana Kidney, B.S., assisted Dr. Melvyn Novas with test administration and scoring procedures. As a separate and discrete service, Dr. Melvyn Novas spent a total of 160 minutes in interpretation and report writing billed as one unit [phone] and two units [id number].

## 2021-07-31 NOTE — Progress Notes (Signed)
° °  Psychometrician Note   Cognitive testing was administered to Christian Mccall by Milana Kidney, B.S. (psychometrist) under the supervision of Dr. Christia Reading, Ph.D., licensed psychologist on 07/31/21. Mr. Madariaga did not appear overtly distressed by the testing session per behavioral observation or responses across self-report questionnaires. Rest breaks were offered.    The battery of tests administered was selected by Dr. Christia Reading, Ph.D. with consideration to Mr. Albarran current level of functioning, the nature of his symptoms, emotional and behavioral responses during interview, level of literacy, observed level of motivation/effort, and the nature of the referral question. This battery was communicated to the psychometrist. Communication between Dr. Christia Reading, Ph.D. and the psychometrist was ongoing throughout the evaluation and Dr. Christia Reading, Ph.D. was immediately accessible at all times. Dr. Christia Reading, Ph.D. provided supervision to the psychometrist on the date of this service to the extent necessary to assure the quality of all services provided.    COTEY RAKES will return within approximately 1-2 weeks for an interactive feedback session with Dr. Melvyn Novas at which time his test performances, clinical impressions, and treatment recommendations will be reviewed in detail. Mr. Darley understands he can contact our office should he require our assistance before this time.  A total of 135 minutes of billable time were spent face-to-face with Mr. Takeshita by the psychometrist. This includes both test administration and scoring time. Billing for these services is reflected in the clinical report generated by Dr. Christia Reading, Ph.D.  This note reflects time spent with the psychometrician and does not include test scores or any clinical interpretations made by Dr. Melvyn Novas. The full report will follow in a separate note.

## 2021-08-01 ENCOUNTER — Encounter: Payer: Self-pay | Admitting: Psychology

## 2021-08-06 ENCOUNTER — Telehealth: Payer: Self-pay | Admitting: Neurology

## 2021-08-06 DIAGNOSIS — G3184 Mild cognitive impairment, so stated: Secondary | ICD-10-CM

## 2021-08-06 DIAGNOSIS — G309 Alzheimer's disease, unspecified: Secondary | ICD-10-CM

## 2021-08-06 DIAGNOSIS — F688 Other specified disorders of adult personality and behavior: Secondary | ICD-10-CM

## 2021-08-06 NOTE — Telephone Encounter (Signed)
Please let pt know that we will order PET scan, as recommended by neuropsychiatry testing, to further evaluate his memory and behavior changes.  Thanks.

## 2021-08-07 ENCOUNTER — Encounter: Payer: Medicare HMO | Admitting: Psychology

## 2021-08-07 DIAGNOSIS — J449 Chronic obstructive pulmonary disease, unspecified: Secondary | ICD-10-CM | POA: Diagnosis not present

## 2021-08-07 NOTE — Telephone Encounter (Signed)
Called patient and informed him that we will order PET scan, as recommended by neuropsychiatry testing, to further evaluate his memory and behavior changes. Patient verbalized understanding and is aware we will have to get approval from insurance and once that is complete someone will call him to get him scheduled.     Order has been Placed.

## 2021-08-10 ENCOUNTER — Other Ambulatory Visit: Payer: Self-pay

## 2021-08-10 ENCOUNTER — Ambulatory Visit (INDEPENDENT_AMBULATORY_CARE_PROVIDER_SITE_OTHER): Payer: Medicare HMO | Admitting: Psychology

## 2021-08-10 DIAGNOSIS — I6781 Acute cerebrovascular insufficiency: Secondary | ICD-10-CM | POA: Diagnosis not present

## 2021-08-10 DIAGNOSIS — F688 Other specified disorders of adult personality and behavior: Secondary | ICD-10-CM

## 2021-08-10 DIAGNOSIS — G3184 Mild cognitive impairment, so stated: Secondary | ICD-10-CM

## 2021-08-10 DIAGNOSIS — I6381 Other cerebral infarction due to occlusion or stenosis of small artery: Secondary | ICD-10-CM

## 2021-08-10 NOTE — Progress Notes (Signed)
? ?  Neuropsychology Feedback Session ?[place] ?[employer] Department of Neurology ? ?Reason for Referral:  ? ?Christian Mccall is a 66 y.o. right-handed Caucasian male referred by Narda Amber, D.O., to characterize his current cognitive functioning and assist with diagnostic clarity and treatment planning in the context of progressive memory decline and deficits with instrumental activities of daily living.  ? ?Feedback:  ? ?Christian Mccall completed a comprehensive neuropsychological evaluation on 07/31/2021. Please refer to that encounter for the full report and recommendations. Briefly, results suggested an isolated impairment across phonemic fluency. He also exhibited some performance variability across executive functioning and aspects of verbal memory. Regarding etiology, consideration should be given to a primary vascular culprit given his medical history (e.g., hypertension, coronary artery disease, type II diabetes, hyperlipidemia, and several lacunar infarctions). Recent neuroimaging also revealed progressive microvascular ischemic changes of moderate severity. Weaknesses in executive functioning, phonemic fluency, and memory are common in vascular etiologies, making this etiology quite plausible at the present time. These difficulties could certainly be exacerbated by his report of mild to moderate levels of anxiety and depression occurring during the past 1-2 weeks.  ? ?Christian Mccall was accompanied by his wife during the current telephone call. They were within their residence while I was within my office. I discussed the limitations of evaluation and management by telemedicine and the availability of in person appointments. Christian Mccall expressed his understanding and agreed to proceed. Content of the current session focused on the results of his neuropsychological evaluation. Christian Mccall was given the opportunity to ask questions and his questions were answered. He was encouraged to reach out  should additional questions arise. His report is available for review on MyChart.  ? ?  ? ?20 minutes were spent conducting the current feedback session with Christian Mccall, billed as one unit [phone].  ?

## 2021-08-14 ENCOUNTER — Other Ambulatory Visit: Payer: Self-pay | Admitting: Gastroenterology

## 2021-08-14 DIAGNOSIS — K7469 Other cirrhosis of liver: Secondary | ICD-10-CM

## 2021-08-16 ENCOUNTER — Ambulatory Visit
Admission: RE | Admit: 2021-08-16 | Discharge: 2021-08-16 | Disposition: A | Discharge status: Home / Self Care | Payer: Medicare HMO | Source: Ambulatory Visit | Attending: Gastroenterology | Admitting: Gastroenterology

## 2021-08-16 DIAGNOSIS — K7469 Other cirrhosis of liver: Secondary | ICD-10-CM

## 2021-08-16 DIAGNOSIS — K7460 Unspecified cirrhosis of liver: Secondary | ICD-10-CM | POA: Diagnosis not present

## 2021-08-19 NOTE — Progress Notes (Signed)
Cardiology Office Note:    Date:  08/20/2021   ID:  Christian Mccall, DOB 09-Apr-1956, MRN [id number]  PCP:  Shon Baton, MD  Cardiologist:  Sinclair Grooms, MD   Referring MD: Shon Baton, MD   Chief Complaint  Patient presents with   Coronary Artery Disease   Hypertension   Chest Pain    History of Present Illness:    Christian Mccall is a 66 y.o. male with a hx of Coronary artery disease, coronary bypass grafting 2002, abdominal aortic aneurysm with endovascular repair and known endoleak 2012, COPD, type 2 diabetes, prior myocardial infarction, history of CVA, obstructive sleep apnea, essential hypertension, diplopia, and hyperlipidemia.   He gets dizzy every time he turns his head to the right to shave his neck with the right hand/arm.  It goes away when he looks forward again.  He has not had any change in his chronic chest pain.  The chest pain is left subpectoral and occurs randomly lasting up to 15 minutes, always at rest.  Has not changed in years.  Denies orthopnea, PND, palpitations, syncope, lower extremity edema, orthopnea, and claudication.  Past Medical History:  Diagnosis Date   Abdominal aortic aneurysm without rupture    Anginal pain    Asymmetric SNHL (sensorineural hearing loss) 11/17/2019   Balanitis 11/30/2013   Bulging lumbar disc    "& some in my neck"   C. difficile colitis    2008 OR 09   CAD (coronary artery disease) of artery bypass graft 07/12/2015    Coronary angiogram 2010 :  CONCLUSION:  1. Bypass graft failure with occlusion of the SVG to the RCA.  2. Severe native disease with occlusion of the LAD and RCA.  3. Normal left ventricular function.  4. Patent saphenous vein grafts to the diagonal and to the OM #1.  The      left internal mammary artery graft is also patent.     Cervicalgia 11/12/2012   Change in platelet count    pt states 'has been low since 2012; patient had to receive platelets in surgery in Nov 2016'   Chronic back pain     Cirrhosis    COPD (chronic obstructive pulmonary disease)    Diabetic peripheral neuropathy    Diplopia 01/30/2019   Dyspnea 02/04/2013   Emphysema lung    Endoleak of aortic graft    Enlarged liver    Enlarged prostate    Essential hypertension, benign 05/03/2013   Genital herpes simplex 12/03/2013   GERD (gastroesophageal reflux disease)    Headache    "~ weekly" (04/12/2015)   Hemiplegia affecting nondominant side 11/12/2012   History of colon polyps    Hyperlipidemia    Impingement syndrome of left shoulder region 02/18/2018   Left facial numbness 01/30/2019   Leg pain    with walking and pain in feet while lying flat   Lumbar back pain 11/12/2012   Mild neurocognitive disorder, unclear etiology 07/31/2021   Multiple lacunar infarcts    REM sleep behaviors    Restless leg syndrome 11/12/2012   Rheumatic fever    AS A CHILD  ?5 YRS OLD   RLS (restless legs syndrome)    Shortness of breath 12/06/2010   while lying flat and with exertion   Snoring    Spleen enlarged    Syphilis, late latent 11/24/2013   Thrombocytopenia    since 2012; can be as low as 54K.    TIA (transient ischemic attack) "  several"   "since 2015" (04/12/2015)   Tinnitus aurium, left 11/17/2019   Transient left leg weakness 11/12/2012   Type II diabetes mellitus 2009    Past Surgical History:  Procedure Laterality Date   ABDOMINAL AORTIC ANEURYSM REPAIR  01/16/2011   EVAR   ABDOMINAL AORTIC ANEURYSM REPAIR  04/12/2015   CARDIAC CATHETERIZATION  "several"   COLONOSCOPY     COLONOSCOPY W/ BIOPSIES     COLONOSCOPY WITH PROPOFOL N/A 03/21/2016   Procedure: COLONOSCOPY WITH PROPOFOL;  Surgeon: Arta Silence, MD;  Location: [facility] ENDOSCOPY;  Service: Endoscopy;  Laterality: N/A;   CORONARY ANGIOPLASTY WITH STENT PLACEMENT  "several"   > 4 sents   CORONARY ARTERY BYPASS GRAFT  02/18/2001   "CABG x 4"   Direct sac puncture with liquid embolic repair of Type II endoleak  04/12/2015; 03/05/2017    ESOPHAGOGASTRODUODENOSCOPY (EGD) WITH PROPOFOL N/A 03/21/2016   Procedure: ESOPHAGOGASTRODUODENOSCOPY (EGD) WITH PROPOFOL;  Surgeon: Arta Silence, MD;  Location: [place];  Service: Endoscopy;  Laterality: N/A;   IR AORTAGRAM ABDOMINAL SERIALOGRAM  03/05/2017   IR CT SPINE LTD  03/05/2017   IR EMBO ARTERIAL NOT HEMORR HEMANG INC GUIDE ROADMAPPING  03/05/2017   IR RADIOLOGIST EVAL & MGMT  02/05/2017   IR RADIOLOGIST EVAL & MGMT  04/01/2017   IR RADIOLOGIST EVAL & MGMT  10/01/2017   IR RADIOLOGIST EVAL & MGMT  02/04/2019   IR RADIOLOGIST EVAL & MGMT  09/15/2019   IR RADIOLOGIST EVAL & MGMT  10/03/2020   LEFT HEART CATH AND CORS/GRAFTS ANGIOGRAPHY N/A 11/13/2018   Procedure: LEFT HEART CATH AND CORS/GRAFTS ANGIOGRAPHY;  Surgeon: Belva Crome, MD;  Location: [place] [facility];  Service: Cardiovascular;  Laterality: N/A;   PERIPHERAL VASCULAR CATHETERIZATION N/A 03/10/2015   Procedure: Abdominal Aortogram;  Surgeon: Elam Dutch, MD;  Location: [place] [facility];  Service: Cardiovascular;  Laterality: N/A;   RADIOLOGY WITH ANESTHESIA N/A 04/12/2015   Procedure: embolization;  Surgeon: Jacqulynn Cadet, MD;  Location: [place];  Service: Radiology;  Laterality: N/A;   RADIOLOGY WITH ANESTHESIA N/A 03/05/2017   Procedure: Type II Endoleak Repair;  Surgeon: Jacqulynn Cadet, MD;  Location: [place];  Service: Radiology;  Laterality: N/A;   SHOULDER SURGERY Right 07-27-2015   TONSILLECTOMY  ~ 1965    Current Medications: Current Meds  Medication Sig   albuterol (PROVENTIL HFA;VENTOLIN HFA) 108 (90 BASE) MCG/ACT inhaler Inhale 2 puffs into the lungs every 6 (six) hours as needed for wheezing or shortness of breath.    Ascorbic Acid (VITAMIN C) 500 MG CAPS Take 500 mg by mouth daily.   aspirin EC 81 MG tablet Take 1 tablet (81 mg total) by mouth daily.   Cholecalciferol (VITAMIN D) 2000 units tablet Take 2,000 Units by mouth 2 (two) times daily.    clonazePAM (KLONOPIN) 1 MG tablet Take 1 mg by mouth 2  (two) times daily.    cyanocobalamin (,VITAMIN B-12,) 1000 MCG/ML injection Inject 1,000 mcg into the muscle See admin instructions. Inject 1000 mcg intramuscularly once every 1 or 2 months   empagliflozin (JARDIANCE) 10 MG TABS tablet Take 10 mg by mouth every morning.    escitalopram (LEXAPRO) 20 MG tablet Take 20 mg by mouth at bedtime.   fenofibrate 160 MG tablet Take 160 mg by mouth every morning.    furosemide (LASIX) 40 MG tablet Take 40 mg by mouth every other day.    glimepiride (AMARYL) 4 MG tablet Take 4 mg by mouth daily.  insulin glargine (LANTUS) 100 unit/mL SOPN Inject 55 Units into the skin at bedtime.   lansoprazole (PREVACID) 30 MG capsule Take 30 mg by mouth every morning.    losartan (COZAAR) 50 MG tablet Take 50 mg by mouth daily.   metFORMIN (GLUCOPHAGE) 1000 MG tablet Take 1,000 mg by mouth daily with breakfast.    metoprolol (LOPRESSOR) 50 MG tablet Take 50 mg by mouth 2 (two) times daily.    nitroGLYCERIN (NITROSTAT) 0.4 MG SL tablet Place 1 tablet (0.4 mg total) under the tongue every 5 (five) minutes as needed for chest pain.   oxyCODONE-acetaminophen (PERCOCET) 5-325 MG tablet Take 1 tablet by mouth every 4 (four) hours as needed (max 6 q).   OXYGEN Inhale into the lungs. 2L at night   pramipexole (MIRAPEX) 0.25 MG tablet Take 0.25 mg by mouth at bedtime.    pregabalin (LYRICA) 100 MG capsule Take 100 mg by mouth 2 (two) times daily. Per patient he takes at 1 am & 2 pm   pyridostigmine (MESTINON) 60 MG tablet Take 1 tablet at 5pm (Patient taking differently: Take 1 tablet at 5pm Patient takes PRN)   ranolazine (RANEXA) 1000 MG SR tablet TAKE 1 TABLET TWICE DAILY (OVERDUE FOR APPT, LAST REFILL, CALL [phone])   rosuvastatin (CRESTOR) 20 MG tablet Take 20 mg by mouth daily.   terazosin (HYTRIN) 1 MG capsule Take 1 mg by mouth at bedtime.      Allergies:   Patient has no known allergies.   Social History   Socioeconomic History   Marital status: Married     Spouse name: reba   Number of children: 2   Years of education: 9   Highest education level: 9th grade  Occupational History   Occupation: Retired/Disability  Tobacco Use   Smoking status: Former    Packs/day: 0.00    Years: 30.00    Pack years: 0.00    Types: Cigarettes    Quit date: 02/17/2001    Years since quitting: 20.5   Smokeless tobacco: Never  Vaping Use   Vaping Use: Never used  Substance and Sexual Activity   Alcohol use: Yes    Alcohol/week: 1.0 standard drink    Types: 1 Cans of beer per week    Comment: occasional   Drug use: No   Sexual activity: Not Currently  Other Topics Concern   Not on file  Social History Narrative   Patient lives at home with his wife.    Right Handed    Lives in a one story motor home    Social Determinants of Health   Financial Resource Strain: Not on file  Food Insecurity: Not on file  Transportation Needs: Not on file  Physical Activity: Not on file  Stress: Not on file  Social Connections: Not on file     Family History: The patient's family history includes Aneurysm in his maternal aunt, maternal uncle, and mother; Cancer in his sister; Heart attack in his brother, brother, and brother; Heart disease in his brother, brother, mother, and sister; Hyperlipidemia in his father and mother; Hypertension in his brother, father, and mother; Lupus in his sister; Other in his maternal aunt, maternal uncle, and mother.  ROS:   Please see the history of present illness.    Worried about increasing difficulty with memory.  Seeing a neurologist.  All other systems reviewed and are negative.  EKGs/Labs/Other Studies Reviewed:    The following studies were reviewed today:  2 D Doppler ECHO 01/2021:  IMPRESSIONS   1. The left ventricle has normal systolic function with an ejection  fraction of 60-65%. The cavity size was normal. There is mildly increased  left ventricular wall thickness. Left ventricular diastolic Doppler  parameters  are consistent with  pseudonormalization.   2. The right ventricle has normal systolic function. The cavity was  normal. There is no increase in right ventricular wall thickness.   3. No evidence of mitral valve stenosis.   4. The tricuspid valve is grossly normal.   5. The aortic valve is grossly normal. No stenosis of the aortic valve.   6. The aorta is normal unless otherwise noted.   7. The aortic root and ascending aorta are normal in size and structure.   8. The atrial septum is grossly normal.   EKG:  EKG normal sinus rhythm, nonspecific T wave flattening, small inferior Q waves.  Normal PR interval.  No significant change compared to prior.  Recent Labs: 09/28/2020: Creatinine, Ser 1.00 02/26/2021: TSH 1.370  Recent Lipid Panel    Component Value Date/Time   CHOL 103 01/30/2019 0412   TRIG 267 (H) 01/30/2019 0412   HDL 25 (L) 01/30/2019 0412   CHOLHDL 4.1 01/30/2019 0412   VLDL 53 (H) 01/30/2019 0412   LDLCALC 25 01/30/2019 0412    Physical Exam:    VS:  BP (!) 150/76    Pulse 75    Ht '5\' 11"'$  (1.803 m)    Wt 197 lb 6.4 oz (89.5 kg)    SpO2 98%    BMI 27.53 kg/m     Wt Readings from Last 3 Encounters:  08/20/21 197 lb 6.4 oz (89.5 kg)  02/26/21 206 lb (93.4 kg)  11/20/20 210 lb (95.3 kg)     GEN: Overweight. No acute distress HEENT: Normal NECK: No JVD. LYMPHATICS: No lymphadenopathy CARDIAC: No murmur. RRR no gallop, or edema. VASCULAR:  Normal Pulses. No bruits. RESPIRATORY:  Clear to auscultation without rales, wheezing or rhonchi  ABDOMEN: Soft, non-tender, non-distended, No pulsatile mass, MUSCULOSKELETAL: No deformity  SKIN: Warm and dry NEUROLOGIC:  Alert and oriented x 3 PSYCHIATRIC:  Normal affect   ASSESSMENT:    1. Coronary artery disease of bypass graft of native heart with stable angina pectoris ([place])   2. TIA (transient ischemic attack)   3. Hyperlipidemia, unspecified hyperlipidemia type   4. Essential (primary) hypertension   5.  Centrilobular emphysema ([place])   6. Infrarenal abdominal aortic aneurysm (AAA) without rupture   7. Type 2 diabetes mellitus with complication, without long-term current use of insulin ([place])   8. Carotid artery disease, unspecified laterality, unspecified type ([place])   9. Dizziness    PLAN:    In order of problems listed above:  Secondary prevention reviewed Seen neurology and will have a PET scan done soon.  Carotid Dopplers being done to follow-up on mild cerebrovascular disease noted in 2015. Continue rosuvastatin. He has not had his blood pressure medicine this morning.  Continue Jardiance, Cozaar, Lopressor, Hytrin, and furosemide. Did not discuss Followed up with ultrasound recently.  This was done by primary care. Continue Jardiance, target LDL less than 6.5. Bilateral carotid Doppler follow-up Dizziness, possibly vertigo  Overall education and awareness concerning secondary risk prevention was discussed in detail: LDL less than 70, hemoglobin A1c less than 7, blood pressure target less than 130/80 mmHg, >150 minutes of moderate aerobic activity per week, avoidance of smoking, weight control (via diet and exercise), and continued surveillance/management of/for obstructive sleep apnea.  Medication Adjustments/Labs and Tests Ordered: Current medicines are reviewed at length with the patient today.  Concerns regarding medicines are outlined above.  Orders Placed This Encounter  Procedures   EKG 12-Lead   VAS US CAROTID   No orders of the defined types were placed in this encounter.   Patient Instructions  Medication Instructions:  Your physician recommends that you continue on your current medications as directed. Please refer to the Current Medication list given to you today.  *If you need a refill on your cardiac medications before your next appointment, please call your pharmacy*   Lab Work: None If you have labs (blood work) drawn today and your tests are completely  normal, you will receive your results only by: [place] (if you have MyChart) OR A paper copy in the mail If you have any lab test that is abnormal or we need to change your treatment, we will call you to review the results.   Testing/Procedures: Your physician has requested that you have a carotid duplex. This test is an ultrasound of the carotid arteries in your neck. It looks at blood flow through these arteries that supply the brain with blood. Allow one hour for this exam. There are no restrictions or special instructions.   Follow-Up: At [facility], you and your health needs are our priority.  As part of our continuing mission to provide you with exceptional heart care, we have created designated Provider Care Teams.  These Care Teams include your primary Cardiologist (physician) and Advanced Practice Providers (APPs -  Physician Assistants and Nurse Practitioners) who all work together to provide you with the care you need, when you need it.  We recommend signing up for the patient portal called "MyChart".  Sign up information is provided on this After Visit Summary.  MyChart is used to connect with patients for Virtual Visits (Telemedicine).  Patients are able to view lab/test results, encounter notes, upcoming appointments, etc.  Non-urgent messages can be sent to your provider as well.   To learn more about what you can do with MyChart, go to [email or web address].    Your next appointment:   1 year(s)  The format for your next appointment:   In Person  Provider:   Sinclair Grooms, MD     Other Instructions     Signed, Sinclair Grooms, MD  08/20/2021 9:56 AM    [place]

## 2021-08-20 ENCOUNTER — Encounter: Payer: Self-pay | Admitting: Interventional Cardiology

## 2021-08-20 ENCOUNTER — Other Ambulatory Visit: Payer: Self-pay

## 2021-08-20 ENCOUNTER — Ambulatory Visit: Payer: Medicare HMO | Admitting: Interventional Cardiology

## 2021-08-20 VITALS — BP 150/76 | HR 75 | Ht 71.0 in | Wt 197.4 lb

## 2021-08-20 DIAGNOSIS — I10 Essential (primary) hypertension: Secondary | ICD-10-CM

## 2021-08-20 DIAGNOSIS — G459 Transient cerebral ischemic attack, unspecified: Secondary | ICD-10-CM | POA: Diagnosis not present

## 2021-08-20 DIAGNOSIS — J432 Centrilobular emphysema: Secondary | ICD-10-CM | POA: Diagnosis not present

## 2021-08-20 DIAGNOSIS — I25708 Atherosclerosis of coronary artery bypass graft(s), unspecified, with other forms of angina pectoris: Secondary | ICD-10-CM

## 2021-08-20 DIAGNOSIS — E785 Hyperlipidemia, unspecified: Secondary | ICD-10-CM | POA: Diagnosis not present

## 2021-08-20 DIAGNOSIS — I779 Disorder of arteries and arterioles, unspecified: Secondary | ICD-10-CM | POA: Diagnosis not present

## 2021-08-20 DIAGNOSIS — R42 Dizziness and giddiness: Secondary | ICD-10-CM | POA: Diagnosis not present

## 2021-08-20 DIAGNOSIS — E118 Type 2 diabetes mellitus with unspecified complications: Secondary | ICD-10-CM | POA: Diagnosis not present

## 2021-08-20 DIAGNOSIS — I7143 Infrarenal abdominal aortic aneurysm, without rupture: Secondary | ICD-10-CM | POA: Diagnosis not present

## 2021-08-20 NOTE — Patient Instructions (Signed)
Medication Instructions:  ?Your physician recommends that you continue on your current medications as directed. Please refer to the Current Medication list given to you today. ? ?*If you need a refill on your cardiac medications before your next appointment, please call your pharmacy* ? ? ?Lab Work: ?None ?If you have labs (blood work) drawn today and your tests are completely normal, you will receive your results only by: ?MyChart Message (if you have MyChart) OR ?A paper copy in the mail ?If you have any lab test that is abnormal or we need to change your treatment, we will call you to review the results. ? ? ?Testing/Procedures: ?Your physician has requested that you have a carotid duplex. This test is an ultrasound of the carotid arteries in your neck. It looks at blood flow through these arteries that supply the brain with blood. Allow one hour for this exam. There are no restrictions or special instructions. ? ? ?Follow-Up: ?At [facility], you and your health needs are our priority.  As part of our continuing mission to provide you with exceptional heart care, we have created designated Provider Care Teams.  These Care Teams include your primary Cardiologist (physician) and Advanced Practice Providers (APPs -  Physician Assistants and Nurse Practitioners) who all work together to provide you with the care you need, when you need it. ? ?We recommend signing up for the patient portal called "MyChart".  Sign up information is provided on this After Visit Summary.  MyChart is used to connect with patients for Virtual Visits (Telemedicine).  Patients are able to view lab/test results, encounter notes, upcoming appointments, etc.  Non-urgent messages can be sent to your provider as well.   ?To learn more about what you can do with MyChart, go to [email or web address].   ? ?Your next appointment:   ?1 year(s) ? ?The format for your next appointment:   ?In Person ? ?Provider:   ?Sinclair Grooms, MD    ? ? ?Other Instructions ?  ?

## 2021-08-21 ENCOUNTER — Telehealth: Payer: Self-pay | Admitting: Interventional Cardiology

## 2021-08-21 DIAGNOSIS — R079 Chest pain, unspecified: Secondary | ICD-10-CM | POA: Diagnosis not present

## 2021-08-21 DIAGNOSIS — I10 Essential (primary) hypertension: Secondary | ICD-10-CM | POA: Diagnosis not present

## 2021-08-21 DIAGNOSIS — Z79899 Other long term (current) drug therapy: Secondary | ICD-10-CM | POA: Diagnosis not present

## 2021-08-21 DIAGNOSIS — Z7982 Long term (current) use of aspirin: Secondary | ICD-10-CM | POA: Diagnosis not present

## 2021-08-21 DIAGNOSIS — E119 Type 2 diabetes mellitus without complications: Secondary | ICD-10-CM | POA: Diagnosis not present

## 2021-08-21 DIAGNOSIS — Z794 Long term (current) use of insulin: Secondary | ICD-10-CM | POA: Diagnosis not present

## 2021-08-21 DIAGNOSIS — Z7951 Long term (current) use of inhaled steroids: Secondary | ICD-10-CM | POA: Diagnosis not present

## 2021-08-21 DIAGNOSIS — Z951 Presence of aortocoronary bypass graft: Secondary | ICD-10-CM | POA: Diagnosis not present

## 2021-08-21 DIAGNOSIS — R059 Cough, unspecified: Secondary | ICD-10-CM | POA: Diagnosis not present

## 2021-08-21 DIAGNOSIS — I2510 Atherosclerotic heart disease of native coronary artery without angina pectoris: Secondary | ICD-10-CM | POA: Diagnosis not present

## 2021-08-21 NOTE — Telephone Encounter (Signed)
Patient's wife called reporting patient woke up this morning with left arm/shoulder pain and he took 2 doses of nitroglycerin. She states his blood pressure was 89/48, HR 40's shortly after taking the nitroglycerin earlier this morning around 7:00/7:30AM. She rechecked it about 30 minutes ago and got a reading of 707 systolic and diastolic in the 86'L. ? ?Patient is complaining of blurred vision, dizziness, and pressure in middle of his chest. ? ?Patient's spouse advised to take patient to ED for evaluation. Patient could be heard in the background stating he wants to talk with Dr. Tamala Julian before he goes to the ED. ? ?Called and spoke with Anderson Malta, Dr. Thompson Caul nurse, and gave report of the above. ?

## 2021-08-21 NOTE — Telephone Encounter (Signed)
Pt c/o BP issue: STAT if pt c/o blurred vision, one-sided weakness or slurred speech ? ?1. What are your last 5 BP readings? 126/50's, started 89/50's ? ?2. Are you having any other symptoms (ex. Dizziness, headache, blurred vision, passed out)? Dizzy, headache, and blurred vision ? ?3. What is your BP issue? Patient's wife states she the patient's bottom BP number is in the 50's and won't go up. ?

## 2021-08-21 NOTE — Telephone Encounter (Signed)
Spoke with wife and advised it is appropriate to go to ED for eval.  [place] to the closest hospital they have there.  She was appreciative for call.  ?

## 2021-08-21 NOTE — Telephone Encounter (Signed)
Lm to call back ./cy 

## 2021-08-22 ENCOUNTER — Ambulatory Visit (HOSPITAL_BASED_OUTPATIENT_CLINIC_OR_DEPARTMENT_OTHER)
Admission: RE | Admit: 2021-08-22 | Discharge: 2021-08-22 | Disposition: A | Discharge status: Home / Self Care | Payer: Medicare HMO | Source: Ambulatory Visit | Attending: Interventional Cardiology | Admitting: Interventional Cardiology

## 2021-08-22 ENCOUNTER — Ambulatory Visit (HOSPITAL_COMMUNITY)
Admission: RE | Admit: 2021-08-22 | Discharge: 2021-08-22 | Disposition: A | Discharge status: Home / Self Care | Payer: Medicare HMO | Source: Ambulatory Visit | Attending: Neurology | Admitting: Neurology

## 2021-08-22 ENCOUNTER — Other Ambulatory Visit: Payer: Self-pay

## 2021-08-22 DIAGNOSIS — I779 Disorder of arteries and arterioles, unspecified: Secondary | ICD-10-CM | POA: Insufficient documentation

## 2021-08-22 DIAGNOSIS — I2510 Atherosclerotic heart disease of native coronary artery without angina pectoris: Secondary | ICD-10-CM | POA: Insufficient documentation

## 2021-08-22 DIAGNOSIS — I10 Essential (primary) hypertension: Secondary | ICD-10-CM | POA: Insufficient documentation

## 2021-08-22 DIAGNOSIS — G309 Alzheimer's disease, unspecified: Secondary | ICD-10-CM | POA: Diagnosis not present

## 2021-08-22 DIAGNOSIS — R42 Dizziness and giddiness: Secondary | ICD-10-CM | POA: Insufficient documentation

## 2021-08-22 DIAGNOSIS — Z87891 Personal history of nicotine dependence: Secondary | ICD-10-CM | POA: Diagnosis not present

## 2021-08-22 DIAGNOSIS — E785 Hyperlipidemia, unspecified: Secondary | ICD-10-CM | POA: Insufficient documentation

## 2021-08-22 DIAGNOSIS — E119 Type 2 diabetes mellitus without complications: Secondary | ICD-10-CM | POA: Diagnosis not present

## 2021-08-22 DIAGNOSIS — R109 Unspecified abdominal pain: Secondary | ICD-10-CM | POA: Diagnosis not present

## 2021-08-22 DIAGNOSIS — F0390 Unspecified dementia without behavioral disturbance: Secondary | ICD-10-CM | POA: Diagnosis not present

## 2021-08-22 LAB — GLUCOSE, CAPILLARY: Glucose-Capillary: 259 mg/dL — ABNORMAL HIGH (ref 70–99)

## 2021-08-22 MED ORDER — FLUDEOXYGLUCOSE F - 18 (FDG) INJECTION
9.8800 | Freq: Once | INTRAVENOUS | Status: AC | PRN
  Administered 2021-08-22: 9.88 via INTRAVENOUS

## 2021-08-25 ENCOUNTER — Encounter: Payer: Self-pay | Admitting: Interventional Cardiology

## 2021-08-28 ENCOUNTER — Telehealth: Payer: Self-pay | Admitting: Neurology

## 2021-08-28 DIAGNOSIS — R42 Dizziness and giddiness: Secondary | ICD-10-CM

## 2021-08-28 NOTE — Telephone Encounter (Signed)
Pt's wife called in and left a message. He had a caroted test last week that came back with an artery blocked. His heart doctor wants a neurologist to take a look at the test and be seen as soon as possible. ?

## 2021-08-28 NOTE — Telephone Encounter (Signed)
Results reviewed.  I recommend that we get better imaging of his blood vessels with CTA head and neck.  We will arrange follow-up after these results are available.  ?

## 2021-08-29 ENCOUNTER — Other Ambulatory Visit: Payer: Self-pay | Admitting: Interventional Radiology

## 2021-08-29 DIAGNOSIS — I9789 Other postprocedural complications and disorders of the circulatory system, not elsewhere classified: Secondary | ICD-10-CM

## 2021-08-29 NOTE — Telephone Encounter (Signed)
Called patients wife and informed her of Dr. Serita Grit recommendations of CTA head and CTA neck. Patients wife is ok with having orders sent to [facility]. Patients wife is aware that we will contact patient once results are back to let them know follow up arrangements.  ?

## 2021-08-29 NOTE — Telephone Encounter (Signed)
Patient's wife Leonia Reeves called to follow up about this. ?

## 2021-09-07 DIAGNOSIS — J449 Chronic obstructive pulmonary disease, unspecified: Secondary | ICD-10-CM | POA: Diagnosis not present

## 2021-09-09 ENCOUNTER — Other Ambulatory Visit: Payer: Self-pay | Admitting: Interventional Cardiology

## 2021-09-09 DIAGNOSIS — I25708 Atherosclerosis of coronary artery bypass graft(s), unspecified, with other forms of angina pectoris: Secondary | ICD-10-CM

## 2021-09-11 ENCOUNTER — Ambulatory Visit
Admission: RE | Admit: 2021-09-11 | Discharge: 2021-09-11 | Disposition: A | Discharge status: Home / Self Care | Payer: Medicare HMO | Source: Ambulatory Visit | Attending: Neurology | Admitting: Neurology

## 2021-09-11 DIAGNOSIS — I672 Cerebral atherosclerosis: Secondary | ICD-10-CM | POA: Diagnosis not present

## 2021-09-11 DIAGNOSIS — R42 Dizziness and giddiness: Secondary | ICD-10-CM

## 2021-09-11 DIAGNOSIS — I6523 Occlusion and stenosis of bilateral carotid arteries: Secondary | ICD-10-CM | POA: Diagnosis not present

## 2021-09-11 DIAGNOSIS — I6503 Occlusion and stenosis of bilateral vertebral arteries: Secondary | ICD-10-CM | POA: Diagnosis not present

## 2021-09-11 MED ORDER — IOPAMIDOL (ISOVUE-370) INJECTION 76%
75.0000 mL | Freq: Once | INTRAVENOUS | Status: AC | PRN
  Administered 2021-09-11: 75 mL via INTRAVENOUS

## 2021-10-07 DIAGNOSIS — J449 Chronic obstructive pulmonary disease, unspecified: Secondary | ICD-10-CM | POA: Diagnosis not present

## 2021-10-08 DIAGNOSIS — N200 Calculus of kidney: Secondary | ICD-10-CM | POA: Diagnosis not present

## 2021-10-08 DIAGNOSIS — R42 Dizziness and giddiness: Secondary | ICD-10-CM | POA: Diagnosis not present

## 2021-10-08 DIAGNOSIS — R112 Nausea with vomiting, unspecified: Secondary | ICD-10-CM | POA: Diagnosis not present

## 2021-10-08 DIAGNOSIS — R519 Headache, unspecified: Secondary | ICD-10-CM | POA: Diagnosis not present

## 2021-11-01 DIAGNOSIS — I252 Old myocardial infarction: Secondary | ICD-10-CM | POA: Diagnosis not present

## 2021-11-01 DIAGNOSIS — F1120 Opioid dependence, uncomplicated: Secondary | ICD-10-CM | POA: Diagnosis not present

## 2021-11-01 DIAGNOSIS — J449 Chronic obstructive pulmonary disease, unspecified: Secondary | ICD-10-CM | POA: Diagnosis not present

## 2021-11-01 DIAGNOSIS — E11319 Type 2 diabetes mellitus with unspecified diabetic retinopathy without macular edema: Secondary | ICD-10-CM | POA: Diagnosis not present

## 2021-11-01 DIAGNOSIS — K7469 Other cirrhosis of liver: Secondary | ICD-10-CM | POA: Diagnosis not present

## 2021-11-01 DIAGNOSIS — F325 Major depressive disorder, single episode, in full remission: Secondary | ICD-10-CM | POA: Diagnosis not present

## 2021-11-01 DIAGNOSIS — I119 Hypertensive heart disease without heart failure: Secondary | ICD-10-CM | POA: Diagnosis not present

## 2021-11-01 DIAGNOSIS — I2510 Atherosclerotic heart disease of native coronary artery without angina pectoris: Secondary | ICD-10-CM | POA: Diagnosis not present

## 2021-11-01 DIAGNOSIS — E1140 Type 2 diabetes mellitus with diabetic neuropathy, unspecified: Secondary | ICD-10-CM | POA: Diagnosis not present

## 2021-11-01 DIAGNOSIS — R079 Chest pain, unspecified: Secondary | ICD-10-CM | POA: Diagnosis not present

## 2021-11-01 DIAGNOSIS — E785 Hyperlipidemia, unspecified: Secondary | ICD-10-CM | POA: Diagnosis not present

## 2021-11-01 DIAGNOSIS — I209 Angina pectoris, unspecified: Secondary | ICD-10-CM | POA: Diagnosis not present

## 2021-11-01 DIAGNOSIS — D692 Other nonthrombocytopenic purpura: Secondary | ICD-10-CM | POA: Diagnosis not present

## 2021-11-01 DIAGNOSIS — Z794 Long term (current) use of insulin: Secondary | ICD-10-CM | POA: Diagnosis not present

## 2021-11-01 DIAGNOSIS — E1151 Type 2 diabetes mellitus with diabetic peripheral angiopathy without gangrene: Secondary | ICD-10-CM | POA: Diagnosis not present

## 2021-11-07 DIAGNOSIS — J449 Chronic obstructive pulmonary disease, unspecified: Secondary | ICD-10-CM | POA: Diagnosis not present

## 2021-11-07 NOTE — Progress Notes (Signed)
Cardiology Office Note:    Date:  11/08/2021   ID:  Christian Mccall, DOB 1955-07-03, MRN [id number]  PCP:  Shon Baton, MD  Cardiologist:  Sinclair Grooms, MD   Referring MD: Shon Baton, MD   Chief Complaint  Patient presents with   Coronary Artery Disease   Congestive Heart Failure   Hyperlipidemia   Hypertension    History of Present Illness:    Christian Mccall is a 66 y.o. male with a hx of  Coronary artery disease, coronary bypass grafting 2002, abdominal aortic aneurysm with endovascular repair and known endoleak 2012, COPD, type 2 diabetes, prior myocardial infarction, history of CVA, obstructive sleep apnea, essential hypertension, diplopia, and hyperlipidemia.   2 weeks ago while tilling soil, he developed severe aching and left arm, shoulder, and chest.  The discomfort improved with sublingual nitro but did not go away.  The discomfort lasted from around noon time until 3 AM the next day.  It will wax and wane in severity.  Since that time, physical activity causes a recurrence of the discomfort.  He has not needed nitroglycerin since then.  He had no pain today with walking in from the parking lot.  He has been afraid to do anything.  The EKG done today despite these episodes of pain does not reveal any evidence of ischemia.  Past Medical History:  Diagnosis Date   Abdominal aortic aneurysm without rupture (HCC)    Anginal pain    Asymmetric SNHL (sensorineural hearing loss) 11/17/2019   Balanitis 11/30/2013   Bulging lumbar disc    "& some in my neck"   C. difficile colitis    2008 OR 09   CAD (coronary artery disease) of artery bypass graft 07/12/2015    Coronary angiogram 2010 :  CONCLUSION:  1. Bypass graft failure with occlusion of the SVG to the RCA.  2. Severe native disease with occlusion of the LAD and RCA.  3. Normal left ventricular function.  4. Patent saphenous vein grafts to the diagonal and to the OM #1.  The      left internal mammary artery graft is also  patent.     Cervicalgia 11/12/2012   Change in platelet count    pt states 'has been low since 2012; patient had to receive platelets in surgery in Nov 2016'   Chronic back pain    Cirrhosis    COPD (chronic obstructive pulmonary disease)    Diabetic peripheral neuropathy    Diplopia 01/30/2019   Dyspnea 02/04/2013   Emphysema lung    Endoleak of aortic graft    Enlarged liver    Enlarged prostate    Essential hypertension, benign 05/03/2013   Genital herpes simplex 12/03/2013   GERD (gastroesophageal reflux disease)    Headache    "~ weekly" (04/12/2015)   Hemiplegia affecting nondominant side 11/12/2012   History of colon polyps    Hyperlipidemia    Impingement syndrome of left shoulder region 02/18/2018   Left facial numbness 01/30/2019   Leg pain    with walking and pain in feet while lying flat   Lumbar back pain 11/12/2012   Mild neurocognitive disorder, unclear etiology 07/31/2021   Multiple lacunar infarcts    REM sleep behaviors    Restless leg syndrome 11/12/2012   Rheumatic fever    AS A CHILD  ?66 YRS OLD   RLS (restless legs syndrome)    Shortness of breath 12/06/2010   while lying flat and with  exertion   Snoring    Spleen enlarged    Syphilis, late latent 11/24/2013   Thrombocytopenia    since 2012; can be as low as 54K.    TIA (transient ischemic attack) "several"   "since 2015" (04/12/2015)   Tinnitus aurium, left 11/17/2019   Transient left leg weakness 11/12/2012   Type II diabetes mellitus 2009    Past Surgical History:  Procedure Laterality Date   ABDOMINAL AORTIC ANEURYSM REPAIR  01/16/2011   EVAR   ABDOMINAL AORTIC ANEURYSM REPAIR  04/12/2015   CARDIAC CATHETERIZATION  "several"   COLONOSCOPY     COLONOSCOPY W/ BIOPSIES     COLONOSCOPY WITH PROPOFOL N/A 03/21/2016   Procedure: COLONOSCOPY WITH PROPOFOL;  Surgeon: Arta Silence, MD;  Location: [facility] ENDOSCOPY;  Service: Endoscopy;  Laterality: N/A;   CORONARY ANGIOPLASTY WITH STENT PLACEMENT   "several"   > 4 sents   CORONARY ARTERY BYPASS GRAFT  02/18/2001   "CABG x 4"   Direct sac puncture with liquid embolic repair of Type II endoleak  04/12/2015; 03/05/2017   ESOPHAGOGASTRODUODENOSCOPY (EGD) WITH PROPOFOL N/A 03/21/2016   Procedure: ESOPHAGOGASTRODUODENOSCOPY (EGD) WITH PROPOFOL;  Surgeon: Arta Silence, MD;  Location: [place];  Service: Endoscopy;  Laterality: N/A;   IR AORTAGRAM ABDOMINAL SERIALOGRAM  03/05/2017   IR CT SPINE LTD  03/05/2017   IR EMBO ARTERIAL NOT HEMORR HEMANG INC GUIDE ROADMAPPING  03/05/2017   IR RADIOLOGIST EVAL & MGMT  02/05/2017   IR RADIOLOGIST EVAL & MGMT  04/01/2017   IR RADIOLOGIST EVAL & MGMT  10/01/2017   IR RADIOLOGIST EVAL & MGMT  02/04/2019   IR RADIOLOGIST EVAL & MGMT  09/15/2019   IR RADIOLOGIST EVAL & MGMT  10/03/2020   LEFT HEART CATH AND CORS/GRAFTS ANGIOGRAPHY N/A 11/13/2018   Procedure: LEFT HEART CATH AND CORS/GRAFTS ANGIOGRAPHY;  Surgeon: Belva Crome, MD;  Location: [place] [facility];  Service: Cardiovascular;  Laterality: N/A;   PERIPHERAL VASCULAR CATHETERIZATION N/A 03/10/2015   Procedure: Abdominal Aortogram;  Surgeon: Elam Dutch, MD;  Location: [place] [facility];  Service: Cardiovascular;  Laterality: N/A;   RADIOLOGY WITH ANESTHESIA N/A 04/12/2015   Procedure: embolization;  Surgeon: Jacqulynn Cadet, MD;  Location: [place];  Service: Radiology;  Laterality: N/A;   RADIOLOGY WITH ANESTHESIA N/A 03/05/2017   Procedure: Type II Endoleak Repair;  Surgeon: Jacqulynn Cadet, MD;  Location: [place];  Service: Radiology;  Laterality: N/A;   SHOULDER SURGERY Right 07-27-2015   TONSILLECTOMY  ~ 1965    Current Medications: Current Meds  Medication Sig   albuterol (PROVENTIL HFA;VENTOLIN HFA) 108 (90 BASE) MCG/ACT inhaler Inhale 2 puffs into the lungs every 6 (six) hours as needed for wheezing or shortness of breath.    aspirin EC 81 MG tablet Take 1 tablet (81 mg total) by mouth daily.   Cholecalciferol (VITAMIN D) 2000 units tablet  Take 2,000 Units by mouth 2 (two) times daily.    cyanocobalamin (,VITAMIN B-12,) 1000 MCG/ML injection Inject 1,000 mcg into the muscle See admin instructions. Inject 1000 mcg intramuscularly once every 1 or 2 months   empagliflozin (JARDIANCE) 10 MG TABS tablet Take 10 mg by mouth every morning.    escitalopram (LEXAPRO) 20 MG tablet Take 20 mg by mouth at bedtime.   fenofibrate 160 MG tablet Take 160 mg by mouth every morning.    furosemide (LASIX) 40 MG tablet Take 40 mg by mouth every other day.    glimepiride (AMARYL) 4 MG tablet Take 4  mg by mouth daily.    insulin glargine (LANTUS) 100 UNIT/ML injection Inject 70 Units into the skin at bedtime.   lansoprazole (PREVACID) 30 MG capsule Take 30 mg by mouth every morning.    losartan (COZAAR) 50 MG tablet Take 50 mg by mouth daily.   metFORMIN (GLUMETZA) 1000 MG (MOD) 24 hr tablet Take 1,000 mg by mouth every evening.   metoprolol (LOPRESSOR) 50 MG tablet Take 50 mg by mouth 2 (two) times daily.    nitroGLYCERIN (NITROSTAT) 0.4 MG SL tablet Place 1 tablet (0.4 mg total) under the tongue every 5 (five) minutes as needed for chest pain (Do not exceed total of 3 doses in 15 minutes, call 911).   NOVOLOG FLEXPEN 100 UNIT/ML FlexPen Inject 12 Units into the skin 3 (three) times daily.   oxyCODONE-acetaminophen (PERCOCET) 5-325 MG tablet Take 1 tablet by mouth every 4 (four) hours as needed (max 6 q).   pramipexole (MIRAPEX) 0.25 MG tablet Take 0.25 mg by mouth at bedtime.    pregabalin (LYRICA) 100 MG capsule Take 100 mg by mouth 2 (two) times daily. Per patient he takes at 1 am & 2 pm   ranolazine (RANEXA) 1000 MG SR tablet TAKE 1 TABLET TWICE DAILY (OVERDUE FOR APPT, LAST REFILL, CALL [phone])   rosuvastatin (CRESTOR) 20 MG tablet Take 20 mg by mouth daily.   terazosin (HYTRIN) 1 MG capsule Take 1 mg by mouth at bedtime.    [DISCONTINUED] clonazePAM (KLONOPIN) 1 MG tablet Take 1 mg by mouth 2 (two) times daily.    [DISCONTINUED]  HYDROcodone-acetaminophen (NORCO/VICODIN) 5-325 MG tablet Take 1 tablet by mouth as needed.     Allergies:   Patient has no known allergies.   Social History   Socioeconomic History   Marital status: Married    Spouse name: reba   Number of children: 2   Years of education: 9   Highest education level: 9th grade  Occupational History   Occupation: Retired/Disability  Tobacco Use   Smoking status: Former    Packs/day: 0.00    Years: 30.00    Pack years: 0.00    Types: Cigarettes    Quit date: 02/17/2001    Years since quitting: 20.7   Smokeless tobacco: Never  Vaping Use   Vaping Use: Never used  Substance and Sexual Activity   Alcohol use: Yes    Alcohol/week: 1.0 standard drink    Types: 1 Cans of beer per week    Comment: occasional   Drug use: No   Sexual activity: Not Currently  Other Topics Concern   Not on file  Social History Narrative   Patient lives at home with his wife.    Right Handed    Lives in a one story motor home    Social Determinants of Health   Financial Resource Strain: Not on file  Food Insecurity: Not on file  Transportation Needs: Not on file  Physical Activity: Not on file  Stress: Not on file  Social Connections: Not on file     Family History: The patient's family history includes Aneurysm in his maternal aunt, maternal uncle, and mother; Cancer in his sister; Heart attack in his brother, brother, and brother; Heart disease in his brother, brother, mother, and sister; Hyperlipidemia in his father and mother; Hypertension in his brother, father, and mother; Lupus in his sister; Other in his maternal aunt, maternal uncle, and mother.  ROS:   Please see the history of present illness.  Seems nervous about his current situation.  He has been afraid to do much of anything.  Denies orthopnea and lower extremity swelling.  All other systems reviewed and are negative.  EKGs/Labs/Other Studies Reviewed:    The following studies were  reviewed today: No new imaging.  Cardiac cath 2020 demonstrates: Diagnostic Dominance: Right Intervention Conclusion  Diffuse heavy native vessel calcification consistent with Monckeberg's sclerosis. Total occlusion of the proximal to mid LAD. Total occlusion of the first obtuse marginal and diffuse disease in the mid circumflex and second obtuse marginal, up to 70 to 80% in spots. Ostial occlusion of the right coronary Patent left main Patent bypass graft to the distal RCA Patent bypass graft to the first obtuse marginal Patent bypass graft to the first diagonal with retrofilling of the mid to distal LAD Either atresia or total occlusion of the LIMA.   EKG:  EKG normal sinus rhythm, nonspecific T wave flattening, poor R wave progression V1 and V2.  Compared to March 2023,  Recent Labs: 02/26/2021: TSH 1.370  Recent Lipid Panel    Component Value Date/Time   CHOL 103 01/30/2019 0412   TRIG 267 (H) 01/30/2019 0412   HDL 25 (L) 01/30/2019 0412   CHOLHDL 4.1 01/30/2019 0412   VLDL 53 (H) 01/30/2019 0412   LDLCALC 25 01/30/2019 0412    Physical Exam:    VS:  BP (!) 158/72   Pulse 72   Ht '5\' 11"'$  (1.803 m)   Wt 199 lb 12.8 oz (90.6 kg)   SpO2 96%   BMI 27.87 kg/m     Wt Readings from Last 3 Encounters:  11/08/21 199 lb 12.8 oz (90.6 kg)  08/20/21 197 lb 6.4 oz (89.5 kg)  02/26/21 206 lb (93.4 kg)     GEN: Overweight. No acute distress HEENT: Normal NECK: No JVD. LYMPHATICS: No lymphadenopathy CARDIAC: No murmur. RRR no gallop, or edema. VASCULAR:  Normal Pulses. No bruits. RESPIRATORY:  Clear to auscultation without rales, wheezing or rhonchi  ABDOMEN: Soft, non-tender, non-distended, No pulsatile mass, MUSCULOSKELETAL: No deformity  SKIN: Warm and dry NEUROLOGIC:  Alert and oriented x 3 PSYCHIATRIC:  Normal affect   ASSESSMENT:    1. Chest pain of uncertain etiology   2. Coronary artery disease of bypass graft of native heart with stable angina pectoris  ([place])   3. Hyperlipidemia, unspecified hyperlipidemia type   4. Essential (primary) hypertension   5. Carotid artery disease, unspecified laterality, unspecified type ([place])   6. Centrilobular emphysema ([place])   7. Pre-procedure lab exam    PLAN:    In order of problems listed above:  Chest pain pattern suggests unstable angina.  This has been going on off and on for the past 2 weeks.  He needs coronary angiography and bypass graft angiography performed.  We will plan to do this tomorrow.  He is to report to the emergency room with prolonged chest pain.  The procedure and risk were discussed with the patient in detail.  He has grafts as noted above. Coronary bypass surgery was performed 2017.  If coronaries are patent, consider aortic origin of chest pain. Continue high intensity statin therapy. Blood pressure is mildly elevated.  Continue same therapy. Did not discuss Did not discuss   The patient was counseled to undergo left heart catheterization, coronary angiography, and possible percutaneous coronary intervention with stent implantation. The procedural risks and benefits were discussed in detail. The risks discussed included death, stroke, myocardial infarction, life-threatening bleeding, limb ischemia, kidney injury,  allergy, and possible emergency cardiac surgery. The risk of these significant complications were estimated to occur less than 1% of the time. After discussion, the patient has agreed to proceed.   Medication Adjustments/Labs and Tests Ordered: Current medicines are reviewed at length with the patient today.  Concerns regarding medicines are outlined above.  Orders Placed This Encounter  Procedures   CBC   Basic metabolic panel   EKG 62-BJSE   No orders of the defined types were placed in this encounter.   Patient Instructions  Medication Instructions:  Your physician recommends that you continue on your current medications as directed. Please refer to the Current  Medication list given to you today.  *If you need a refill on your cardiac medications before your next appointment, please call your pharmacy*  Lab Work: TODAY: CBC, BMET If you have labs (blood work) drawn today and your tests are completely normal, you will receive your results only by: [place] (if you have MyChart) OR A paper copy in the mail If you have any lab test that is abnormal or we need to change your treatment, we will call you to review the results.  Testing/Procedures: NONE  Follow-Up: At [facility], you and your health needs are our priority.  As part of our continuing mission to provide you with exceptional heart care, we have created designated Provider Care Teams.  These Care Teams include your primary Cardiologist (physician) and Advanced Practice Providers (APPs -  Physician Assistants and Nurse Practitioners) who all work together to provide you with the care you need, when you need it.  Your next appointment:   2-3 week(s)  The format for your next appointment:   In Person  Provider:   Sinclair Grooms, MD  or Christen Bame, NP or Richardson Dopp, PA-C       Other Instructions  [facility] OFFICE [place], [place] [address] Dept: [place]: [place]  11/08/2021  You are scheduled for a Cardiac Catheterization on Friday, June 2 with Dr. Daneen Schick.  1. Please arrive at the Main Entrance A at [facility]: [place], [place] [address] at 1:30 PM (This time is two hours before your procedure to ensure your preparation). Free valet parking service is available.   Special note: Every effort is made to have your procedure done on time. Please understand that emergencies sometimes delay scheduled procedures.  2. Diet: Do not eat solid foods after midnight.  You may have clear liquids until 5 AM upon the  day of the procedure.  3. Medication instructions in preparation for your procedure:  Stop taking, Lasix (Furosemide)  Friday, June 2,  Take only 35 units of your Lantus the night before the procedure (Thursday June 1) and do not take any insulin the morning of your procedure.  Do not take Diabetes Med Glucophage (Metformin) on the day of the procedure and HOLD 48 HOURS AFTER THE PROCEDURE.  Do not take your glimepiride the morning of your procedure.  On the morning of your procedure, take Aspirin 81 mg and any morning medicines NOT listed above.  You may use sips of water.  4. Plan to go home the same day, you will only stay overnight if medically necessary. 5. You MUST have a responsible adult to drive you home. 6. An adult MUST be with you the first 24 hours after you  arrive home. 7. Bring a current list of your medications, and the last time and date medication taken. 8. Bring ID and current insurance cards. 9.Please wear clothes that are easy to get on and off and wear slip-on shoes.  Thank you for allowing [place] to care for you!   -- [facility]   Important Information About Sugar         Signed, Sinclair Grooms, MD  11/08/2021 3:48 PM    [place]

## 2021-11-07 NOTE — H&P (View-Only) (Signed)
Cardiology Office Note:    Date:  11/08/2021   ID:  Christian Mccall, DOB 12-Apr-1956, MRN [id number]  PCP:  Shon Baton, MD  Cardiologist:  Sinclair Grooms, MD   Referring MD: Shon Baton, MD   Chief Complaint  Patient presents with   Coronary Artery Disease   Congestive Heart Failure   Hyperlipidemia   Hypertension    History of Present Illness:    Christian Mccall is a 66 y.o. male with a hx of  Coronary artery disease, coronary bypass grafting 2002, abdominal aortic aneurysm with endovascular repair and known endoleak 2012, COPD, type 2 diabetes, prior myocardial infarction, history of CVA, obstructive sleep apnea, essential hypertension, diplopia, and hyperlipidemia.   2 weeks ago while tilling soil, he developed severe aching and left arm, shoulder, and chest.  The discomfort improved with sublingual nitro but did not go away.  The discomfort lasted from around noon time until 3 AM the next day.  It will wax and wane in severity.  Since that time, physical activity causes a recurrence of the discomfort.  He has not needed nitroglycerin since then.  He had no pain today with walking in from the parking lot.  He has been afraid to do anything.  The EKG done today despite these episodes of pain does not reveal any evidence of ischemia.  Past Medical History:  Diagnosis Date   Abdominal aortic aneurysm without rupture (HCC)    Anginal pain    Asymmetric SNHL (sensorineural hearing loss) 11/17/2019   Balanitis 11/30/2013   Bulging lumbar disc    "& some in my neck"   C. difficile colitis    2008 OR 09   CAD (coronary artery disease) of artery bypass graft 07/12/2015    Coronary angiogram 2010 :  CONCLUSION:  1. Bypass graft failure with occlusion of the SVG to the RCA.  2. Severe native disease with occlusion of the LAD and RCA.  3. Normal left ventricular function.  4. Patent saphenous vein grafts to the diagonal and to the OM #1.  The      left internal mammary artery graft is also  patent.     Cervicalgia 11/12/2012   Change in platelet count    pt states 'has been low since 2012; patient had to receive platelets in surgery in Nov 2016'   Chronic back pain    Cirrhosis    COPD (chronic obstructive pulmonary disease)    Diabetic peripheral neuropathy    Diplopia 01/30/2019   Dyspnea 02/04/2013   Emphysema lung    Endoleak of aortic graft    Enlarged liver    Enlarged prostate    Essential hypertension, benign 05/03/2013   Genital herpes simplex 12/03/2013   GERD (gastroesophageal reflux disease)    Headache    "~ weekly" (04/12/2015)   Hemiplegia affecting nondominant side 11/12/2012   History of colon polyps    Hyperlipidemia    Impingement syndrome of left shoulder region 02/18/2018   Left facial numbness 01/30/2019   Leg pain    with walking and pain in feet while lying flat   Lumbar back pain 11/12/2012   Mild neurocognitive disorder, unclear etiology 07/31/2021   Multiple lacunar infarcts    REM sleep behaviors    Restless leg syndrome 11/12/2012   Rheumatic fever    AS A CHILD  ?5 YRS OLD   RLS (restless legs syndrome)    Shortness of breath 12/06/2010   while lying flat and with  exertion   Snoring    Spleen enlarged    Syphilis, late latent 11/24/2013   Thrombocytopenia    since 2012; can be as low as 54K.    TIA (transient ischemic attack) "several"   "since 2015" (04/12/2015)   Tinnitus aurium, left 11/17/2019   Transient left leg weakness 11/12/2012   Type II diabetes mellitus 2009    Past Surgical History:  Procedure Laterality Date   ABDOMINAL AORTIC ANEURYSM REPAIR  01/16/2011   EVAR   ABDOMINAL AORTIC ANEURYSM REPAIR  04/12/2015   CARDIAC CATHETERIZATION  "several"   COLONOSCOPY     COLONOSCOPY W/ BIOPSIES     COLONOSCOPY WITH PROPOFOL N/A 03/21/2016   Procedure: COLONOSCOPY WITH PROPOFOL;  Surgeon: Arta Silence, MD;  Location: [facility] ENDOSCOPY;  Service: Endoscopy;  Laterality: N/A;   CORONARY ANGIOPLASTY WITH STENT PLACEMENT   "several"   > 4 sents   CORONARY ARTERY BYPASS GRAFT  02/18/2001   "CABG x 4"   Direct sac puncture with liquid embolic repair of Type II endoleak  04/12/2015; 03/05/2017   ESOPHAGOGASTRODUODENOSCOPY (EGD) WITH PROPOFOL N/A 03/21/2016   Procedure: ESOPHAGOGASTRODUODENOSCOPY (EGD) WITH PROPOFOL;  Surgeon: Arta Silence, MD;  Location: [place];  Service: Endoscopy;  Laterality: N/A;   IR AORTAGRAM ABDOMINAL SERIALOGRAM  03/05/2017   IR CT SPINE LTD  03/05/2017   IR EMBO ARTERIAL NOT HEMORR HEMANG INC GUIDE ROADMAPPING  03/05/2017   IR RADIOLOGIST EVAL & MGMT  02/05/2017   IR RADIOLOGIST EVAL & MGMT  04/01/2017   IR RADIOLOGIST EVAL & MGMT  10/01/2017   IR RADIOLOGIST EVAL & MGMT  02/04/2019   IR RADIOLOGIST EVAL & MGMT  09/15/2019   IR RADIOLOGIST EVAL & MGMT  10/03/2020   LEFT HEART CATH AND CORS/GRAFTS ANGIOGRAPHY N/A 11/13/2018   Procedure: LEFT HEART CATH AND CORS/GRAFTS ANGIOGRAPHY;  Surgeon: Belva Crome, MD;  Location: [place] CV LAB;  Service: Cardiovascular;  Laterality: N/A;   PERIPHERAL VASCULAR CATHETERIZATION N/A 03/10/2015   Procedure: Abdominal Aortogram;  Surgeon: Elam Dutch, MD;  Location: [place] CV LAB;  Service: Cardiovascular;  Laterality: N/A;   RADIOLOGY WITH ANESTHESIA N/A 04/12/2015   Procedure: embolization;  Surgeon: Jacqulynn Cadet, MD;  Location: [place];  Service: Radiology;  Laterality: N/A;   RADIOLOGY WITH ANESTHESIA N/A 03/05/2017   Procedure: Type II Endoleak Repair;  Surgeon: Jacqulynn Cadet, MD;  Location: [place];  Service: Radiology;  Laterality: N/A;   SHOULDER SURGERY Right 07-27-2015   TONSILLECTOMY  ~ 1965    Current Medications: Current Meds  Medication Sig   albuterol (PROVENTIL HFA;VENTOLIN HFA) 108 (90 BASE) MCG/ACT inhaler Inhale 2 puffs into the lungs every 6 (six) hours as needed for wheezing or shortness of breath.    aspirin EC 81 MG tablet Take 1 tablet (81 mg total) by mouth daily.   Cholecalciferol (VITAMIN D) 2000 units tablet  Take 2,000 Units by mouth 2 (two) times daily.    cyanocobalamin (,VITAMIN B-12,) 1000 MCG/ML injection Inject 1,000 mcg into the muscle See admin instructions. Inject 1000 mcg intramuscularly once every 1 or 2 months   empagliflozin (JARDIANCE) 10 MG TABS tablet Take 10 mg by mouth every morning.    escitalopram (LEXAPRO) 20 MG tablet Take 20 mg by mouth at bedtime.   fenofibrate 160 MG tablet Take 160 mg by mouth every morning.    furosemide (LASIX) 40 MG tablet Take 40 mg by mouth every other day.    glimepiride (AMARYL) 4 MG tablet Take 4  mg by mouth daily.    insulin glargine (LANTUS) 100 UNIT/ML injection Inject 70 Units into the skin at bedtime.   lansoprazole (PREVACID) 30 MG capsule Take 30 mg by mouth every morning.    losartan (COZAAR) 50 MG tablet Take 50 mg by mouth daily.   metFORMIN (GLUMETZA) 1000 MG (MOD) 24 hr tablet Take 1,000 mg by mouth every evening.   metoprolol (LOPRESSOR) 50 MG tablet Take 50 mg by mouth 2 (two) times daily.    nitroGLYCERIN (NITROSTAT) 0.4 MG SL tablet Place 1 tablet (0.4 mg total) under the tongue every 5 (five) minutes as needed for chest pain (Do not exceed total of 3 doses in 15 minutes, call 911).   NOVOLOG FLEXPEN 100 UNIT/ML FlexPen Inject 12 Units into the skin 3 (three) times daily.   oxyCODONE-acetaminophen (PERCOCET) 5-325 MG tablet Take 1 tablet by mouth every 4 (four) hours as needed (max 6 q).   pramipexole (MIRAPEX) 0.25 MG tablet Take 0.25 mg by mouth at bedtime.    pregabalin (LYRICA) 100 MG capsule Take 100 mg by mouth 2 (two) times daily. Per patient he takes at 1 am & 2 pm   ranolazine (RANEXA) 1000 MG SR tablet TAKE 1 TABLET TWICE DAILY (OVERDUE FOR APPT, LAST REFILL, CALL [phone])   rosuvastatin (CRESTOR) 20 MG tablet Take 20 mg by mouth daily.   terazosin (HYTRIN) 1 MG capsule Take 1 mg by mouth at bedtime.    [DISCONTINUED] clonazePAM (KLONOPIN) 1 MG tablet Take 1 mg by mouth 2 (two) times daily.    [DISCONTINUED]  HYDROcodone-acetaminophen (NORCO/VICODIN) 5-325 MG tablet Take 1 tablet by mouth as needed.     Allergies:   Patient has no known allergies.   Social History   Socioeconomic History   Marital status: Married    Spouse name: reba   Number of children: 2   Years of education: 9   Highest education level: 9th grade  Occupational History   Occupation: Retired/Disability  Tobacco Use   Smoking status: Former    Packs/day: 0.00    Years: 30.00    Pack years: 0.00    Types: Cigarettes    Quit date: 02/17/2001    Years since quitting: 20.7   Smokeless tobacco: Never  Vaping Use   Vaping Use: Never used  Substance and Sexual Activity   Alcohol use: Yes    Alcohol/week: 1.0 standard drink    Types: 1 Cans of beer per week    Comment: occasional   Drug use: No   Sexual activity: Not Currently  Other Topics Concern   Not on file  Social History Narrative   Patient lives at home with his wife.    Right Handed    Lives in a one story motor home    Social Determinants of Health   Financial Resource Strain: Not on file  Food Insecurity: Not on file  Transportation Needs: Not on file  Physical Activity: Not on file  Stress: Not on file  Social Connections: Not on file     Family History: The patient's family history includes Aneurysm in his maternal aunt, maternal uncle, and mother; Cancer in his sister; Heart attack in his brother, brother, and brother; Heart disease in his brother, brother, mother, and sister; Hyperlipidemia in his father and mother; Hypertension in his brother, father, and mother; Lupus in his sister; Other in his maternal aunt, maternal uncle, and mother.  ROS:   Please see the history of present illness.  Seems nervous about his current situation.  He has been afraid to do much of anything.  Denies orthopnea and lower extremity swelling.  All other systems reviewed and are negative.  EKGs/Labs/Other Studies Reviewed:    The following studies were  reviewed today: No new imaging.  Cardiac cath 2020 demonstrates: Diagnostic Dominance: Right Intervention Conclusion  Diffuse heavy native vessel calcification consistent with Monckeberg's sclerosis. Total occlusion of the proximal to mid LAD. Total occlusion of the first obtuse marginal and diffuse disease in the mid circumflex and second obtuse marginal, up to 70 to 80% in spots. Ostial occlusion of the right coronary Patent left main Patent bypass graft to the distal RCA Patent bypass graft to the first obtuse marginal Patent bypass graft to the first diagonal with retrofilling of the mid to distal LAD Either atresia or total occlusion of the LIMA.   EKG:  EKG normal sinus rhythm, nonspecific T wave flattening, poor R wave progression V1 and V2.  Compared to March 2023,  Recent Labs: 02/26/2021: TSH 1.370  Recent Lipid Panel    Component Value Date/Time   CHOL 103 01/30/2019 0412   TRIG 267 (H) 01/30/2019 0412   HDL 25 (L) 01/30/2019 0412   CHOLHDL 4.1 01/30/2019 0412   VLDL 53 (H) 01/30/2019 0412   LDLCALC 25 01/30/2019 0412    Physical Exam:    VS:  BP (!) 158/72   Pulse 72   Ht '5\' 11"'$  (1.803 m)   Wt 199 lb 12.8 oz (90.6 kg)   SpO2 96%   BMI 27.87 kg/m     Wt Readings from Last 3 Encounters:  11/08/21 199 lb 12.8 oz (90.6 kg)  08/20/21 197 lb 6.4 oz (89.5 kg)  02/26/21 206 lb (93.4 kg)     GEN: Overweight. No acute distress HEENT: Normal NECK: No JVD. LYMPHATICS: No lymphadenopathy CARDIAC: No murmur. RRR no gallop, or edema. VASCULAR:  Normal Pulses. No bruits. RESPIRATORY:  Clear to auscultation without rales, wheezing or rhonchi  ABDOMEN: Soft, non-tender, non-distended, No pulsatile mass, MUSCULOSKELETAL: No deformity  SKIN: Warm and dry NEUROLOGIC:  Alert and oriented x 3 PSYCHIATRIC:  Normal affect   ASSESSMENT:    1. Chest pain of uncertain etiology   2. Coronary artery disease of bypass graft of native heart with stable angina pectoris  ([place])   3. Hyperlipidemia, unspecified hyperlipidemia type   4. Essential (primary) hypertension   5. Carotid artery disease, unspecified laterality, unspecified type ([place])   6. Centrilobular emphysema ([place])   7. Pre-procedure lab exam    PLAN:    In order of problems listed above:  Chest pain pattern suggests unstable angina.  This has been going on off and on for the past 2 weeks.  He needs coronary angiography and bypass graft angiography performed.  We will plan to do this tomorrow.  He is to report to the emergency room with prolonged chest pain.  The procedure and risk were discussed with the patient in detail.  He has grafts as noted above. Coronary bypass surgery was performed 2017.  If coronaries are patent, consider aortic origin of chest pain. Continue high intensity statin therapy. Blood pressure is mildly elevated.  Continue same therapy. Did not discuss Did not discuss   The patient was counseled to undergo left heart catheterization, coronary angiography, and possible percutaneous coronary intervention with stent implantation. The procedural risks and benefits were discussed in detail. The risks discussed included death, stroke, myocardial infarction, life-threatening bleeding, limb ischemia, kidney injury,  allergy, and possible emergency cardiac surgery. The risk of these significant complications were estimated to occur less than 1% of the time. After discussion, the patient has agreed to proceed.   Medication Adjustments/Labs and Tests Ordered: Current medicines are reviewed at length with the patient today.  Concerns regarding medicines are outlined above.  Orders Placed This Encounter  Procedures   CBC   Basic metabolic panel   EKG [id number]   No orders of the defined types were placed in this encounter.   Patient Instructions  Medication Instructions:  Your physician recommends that you continue on your current medications as directed. Please refer to the Current  Medication list given to you today.  *If you need a refill on your cardiac medications before your next appointment, please call your pharmacy*  Lab Work: TODAY: CBC, BMET If you have labs (blood work) drawn today and your tests are completely normal, you will receive your results only by: [place] (if you have MyChart) OR A paper copy in the mail If you have any lab test that is abnormal or we need to change your treatment, we will call you to review the results.  Testing/Procedures: NONE  Follow-Up: At [facility], you and your health needs are our priority.  As part of our continuing mission to provide you with exceptional heart care, we have created designated Provider Care Teams.  These Care Teams include your primary Cardiologist (physician) and Advanced Practice Providers (APPs -  Physician Assistants and Nurse Practitioners) who all work together to provide you with the care you need, when you need it.  Your next appointment:   2-3 week(s)  The format for your next appointment:   In Person  Provider:   Sinclair Grooms, MD  or Christen Bame, NP or Richardson Dopp, PA-C       Other Instructions  [place] OFFICE [place], [place] [address] Dept: [place]: [place]  11/08/2021  You are scheduled for a Cardiac Catheterization on Friday, June 2 with Dr. Daneen Schick.  1. Please arrive at the Main Entrance A at [facility]: [place], [place] [address] at 1:30 PM (This time is two hours before your procedure to ensure your preparation). Free valet parking service is available.   Special note: Every effort is made to have your procedure done on time. Please understand that emergencies sometimes delay scheduled procedures.  2. Diet: Do not eat solid foods after midnight.  You may have clear liquids until 5 AM upon the  day of the procedure.  3. Medication instructions in preparation for your procedure:  Stop taking, Lasix (Furosemide)  Friday, June 2,  Take only 35 units of your Lantus the night before the procedure (Thursday June 1) and do not take any insulin the morning of your procedure.  Do not take Diabetes Med Glucophage (Metformin) on the day of the procedure and HOLD 48 HOURS AFTER THE PROCEDURE.  Do not take your glimepiride the morning of your procedure.  On the morning of your procedure, take Aspirin 81 mg and any morning medicines NOT listed above.  You may use sips of water.  4. Plan to go home the same day, you will only stay overnight if medically necessary. 5. You MUST have a responsible adult to drive you home. 6. An adult MUST be with you the first 24 hours after you  arrive home. 7. Bring a current list of your medications, and the last time and date medication taken. 8. Bring ID and current insurance cards. 9.Please wear clothes that are easy to get on and off and wear slip-on shoes.  Thank you for allowing [place] to care for you!   -- [place] [facility]   Important Information About Sugar         Signed, Sinclair Grooms, MD  11/08/2021 3:48 PM    [place]

## 2021-11-08 ENCOUNTER — Encounter: Payer: Self-pay | Admitting: Interventional Cardiology

## 2021-11-08 ENCOUNTER — Ambulatory Visit: Payer: Medicare HMO | Admitting: Interventional Cardiology

## 2021-11-08 VITALS — BP 158/72 | HR 72 | Ht 71.0 in | Wt 199.8 lb

## 2021-11-08 DIAGNOSIS — I25708 Atherosclerosis of coronary artery bypass graft(s), unspecified, with other forms of angina pectoris: Secondary | ICD-10-CM

## 2021-11-08 DIAGNOSIS — J432 Centrilobular emphysema: Secondary | ICD-10-CM

## 2021-11-08 DIAGNOSIS — I779 Disorder of arteries and arterioles, unspecified: Secondary | ICD-10-CM | POA: Diagnosis not present

## 2021-11-08 DIAGNOSIS — I10 Essential (primary) hypertension: Secondary | ICD-10-CM | POA: Diagnosis not present

## 2021-11-08 DIAGNOSIS — Z01812 Encounter for preprocedural laboratory examination: Secondary | ICD-10-CM

## 2021-11-08 DIAGNOSIS — R079 Chest pain, unspecified: Secondary | ICD-10-CM | POA: Diagnosis not present

## 2021-11-08 DIAGNOSIS — E785 Hyperlipidemia, unspecified: Secondary | ICD-10-CM

## 2021-11-08 NOTE — Patient Instructions (Addendum)
Medication Instructions:  Your physician recommends that you continue on your current medications as directed. Please refer to the Current Medication list given to you today.  *If you need a refill on your cardiac medications before your next appointment, please call your pharmacy*  Lab Work: TODAY: CBC, BMET If you have labs (blood work) drawn today and your tests are completely normal, you will receive your results only by: [place] (if you have MyChart) OR A paper copy in the mail If you have any lab test that is abnormal or we need to change your treatment, we will call you to review the results.  Testing/Procedures: NONE  Follow-Up: At [facility], you and your health needs are our priority.  As part of our continuing mission to provide you with exceptional heart care, we have created designated Provider Care Teams.  These Care Teams include your primary Cardiologist (physician) and Advanced Practice Providers (APPs -  Physician Assistants and Nurse Practitioners) who all work together to provide you with the care you need, when you need it.  Your next appointment:   2-3 week(s)  The format for your next appointment:   In Person  Provider:   Sinclair Grooms, MD  or Christen Bame, NP or Richardson Dopp, PA-C       Other Instructions  [place] OFFICE [place], [place] [address] Dept: [place]: [place]  11/08/2021  You are scheduled for a Cardiac Catheterization on Friday, June 2 with Dr. Daneen Schick.  1. Please arrive at the Main Entrance A at [facility]: [place], [place] [address] at 1:30 PM (This time is two hours before your procedure to ensure your preparation). Free valet parking service is available.   Special note: Every effort is made to have your procedure done on time. Please understand that  emergencies sometimes delay scheduled procedures.  2. Diet: Do not eat solid foods after midnight.  You may have clear liquids until 5 AM upon the day of the procedure.  3. Medication instructions in preparation for your procedure:  Stop taking, Lasix (Furosemide)  Friday, June 2,  Take only 35 units of your Lantus the night before the procedure (Thursday June 1) and do not take any insulin the morning of your procedure.  Do not take Diabetes Med Glucophage (Metformin) on the day of the procedure and HOLD 48 HOURS AFTER THE PROCEDURE.  Do not take your glimepiride the morning of your procedure.  On the morning of your procedure, take Aspirin 81 mg and any morning medicines NOT listed above.  You may use sips of water.  4. Plan to go home the same day, you will only stay overnight if medically necessary. 5. You MUST have a responsible adult to drive you home. 6. An adult MUST be with you the first 24 hours after you arrive home. 7. Bring a current list of your medications, and the last time and date medication taken. 8. Bring ID and current insurance cards. 9.Please wear clothes that are easy to get on and off and wear slip-on shoes.  Thank you for allowing [place] to care for you!   -- [place] [facility]   Important Information About Sugar

## 2021-11-09 ENCOUNTER — Ambulatory Visit (HOSPITAL_COMMUNITY)
Admission: RE | Admit: 2021-11-09 | Discharge: 2021-11-09 | Disposition: A | Discharge status: Home / Self Care | Payer: Medicare HMO | Attending: Interventional Cardiology | Admitting: Interventional Cardiology

## 2021-11-09 ENCOUNTER — Other Ambulatory Visit: Payer: Self-pay

## 2021-11-09 ENCOUNTER — Encounter (HOSPITAL_COMMUNITY)
Admission: RE | Disposition: A | Discharge status: Home / Self Care | Payer: Self-pay | Source: Home / Self Care | Attending: Interventional Cardiology

## 2021-11-09 DIAGNOSIS — Z8673 Personal history of transient ischemic attack (TIA), and cerebral infarction without residual deficits: Secondary | ICD-10-CM | POA: Insufficient documentation

## 2021-11-09 DIAGNOSIS — R0600 Dyspnea, unspecified: Secondary | ICD-10-CM | POA: Diagnosis present

## 2021-11-09 DIAGNOSIS — Z794 Long term (current) use of insulin: Secondary | ICD-10-CM | POA: Insufficient documentation

## 2021-11-09 DIAGNOSIS — I252 Old myocardial infarction: Secondary | ICD-10-CM | POA: Diagnosis not present

## 2021-11-09 DIAGNOSIS — G4733 Obstructive sleep apnea (adult) (pediatric): Secondary | ICD-10-CM | POA: Diagnosis not present

## 2021-11-09 DIAGNOSIS — E785 Hyperlipidemia, unspecified: Secondary | ICD-10-CM | POA: Diagnosis not present

## 2021-11-09 DIAGNOSIS — Z951 Presence of aortocoronary bypass graft: Secondary | ICD-10-CM | POA: Diagnosis not present

## 2021-11-09 DIAGNOSIS — I10 Essential (primary) hypertension: Secondary | ICD-10-CM | POA: Diagnosis not present

## 2021-11-09 DIAGNOSIS — I25719 Atherosclerosis of autologous vein coronary artery bypass graft(s) with unspecified angina pectoris: Secondary | ICD-10-CM

## 2021-11-09 DIAGNOSIS — I6529 Occlusion and stenosis of unspecified carotid artery: Secondary | ICD-10-CM | POA: Diagnosis not present

## 2021-11-09 DIAGNOSIS — I25700 Atherosclerosis of coronary artery bypass graft(s), unspecified, with unstable angina pectoris: Secondary | ICD-10-CM | POA: Diagnosis not present

## 2021-11-09 DIAGNOSIS — Z7984 Long term (current) use of oral hypoglycemic drugs: Secondary | ICD-10-CM | POA: Diagnosis not present

## 2021-11-09 DIAGNOSIS — J449 Chronic obstructive pulmonary disease, unspecified: Secondary | ICD-10-CM | POA: Insufficient documentation

## 2021-11-09 DIAGNOSIS — Z87891 Personal history of nicotine dependence: Secondary | ICD-10-CM | POA: Diagnosis not present

## 2021-11-09 DIAGNOSIS — I25119 Atherosclerotic heart disease of native coronary artery with unspecified angina pectoris: Secondary | ICD-10-CM

## 2021-11-09 DIAGNOSIS — I209 Angina pectoris, unspecified: Secondary | ICD-10-CM

## 2021-11-09 DIAGNOSIS — I2582 Chronic total occlusion of coronary artery: Secondary | ICD-10-CM | POA: Insufficient documentation

## 2021-11-09 HISTORY — PX: LEFT HEART CATH AND CORS/GRAFTS ANGIOGRAPHY: CATH118250

## 2021-11-09 LAB — CBC
Hematocrit: 48.5 % (ref 37.5–51.0)
Hemoglobin: 16.3 g/dL (ref 13.0–17.7)
MCH: 31.2 pg (ref 26.6–33.0)
MCHC: 33.6 g/dL (ref 31.5–35.7)
MCV: 93 fL (ref 79–97)
Platelets: 89 10*3/uL — CL (ref 150–450)
RBC: 5.23 x10E6/uL (ref 4.14–5.80)
RDW: 13 % (ref 11.6–15.4)
WBC: 6.4 10*3/uL (ref 3.4–10.8)

## 2021-11-09 LAB — BASIC METABOLIC PANEL
BUN/Creatinine Ratio: 13 (ref 10–24)
BUN: 11 mg/dL (ref 8–27)
CO2: 20 mmol/L (ref 20–29)
Calcium: 9.5 mg/dL (ref 8.6–10.2)
Chloride: 98 mmol/L (ref 96–106)
Creatinine, Ser: 0.87 mg/dL (ref 0.76–1.27)
Glucose: 286 mg/dL — ABNORMAL HIGH (ref 70–99)
Potassium: 4 mmol/L (ref 3.5–5.2)
Sodium: 138 mmol/L (ref 134–144)
eGFR: 96 mL/min/{1.73_m2} (ref >59)

## 2021-11-09 LAB — GLUCOSE, CAPILLARY: Glucose-Capillary: 237 mg/dL — ABNORMAL HIGH (ref 70–99)

## 2021-11-09 SURGERY — LEFT HEART CATH AND CORS/GRAFTS ANGIOGRAPHY
Anesthesia: LOCAL

## 2021-11-09 MED ORDER — FENTANYL CITRATE (PF) 100 MCG/2ML IJ SOLN
INTRAMUSCULAR | Status: AC
  Filled 2021-11-09: qty 2

## 2021-11-09 MED ORDER — FENTANYL CITRATE (PF) 100 MCG/2ML IJ SOLN
INTRAMUSCULAR | Status: DC | PRN
  Administered 2021-11-09: 50 ug via INTRAVENOUS

## 2021-11-09 MED ORDER — MIDAZOLAM HCL 2 MG/2ML IJ SOLN
INTRAMUSCULAR | Status: AC
  Filled 2021-11-09: qty 2

## 2021-11-09 MED ORDER — IOHEXOL 350 MG/ML SOLN
INTRAVENOUS | Status: DC | PRN
  Administered 2021-11-09: 115 mL

## 2021-11-09 MED ORDER — SODIUM CHLORIDE 0.9 % WEIGHT BASED INFUSION
3.0000 mL/kg/h | INTRAVENOUS | Status: AC
  Administered 2021-11-09: 3 mL/kg/h via INTRAVENOUS

## 2021-11-09 MED ORDER — MIDAZOLAM HCL 2 MG/2ML IJ SOLN
INTRAMUSCULAR | Status: DC | PRN
  Administered 2021-11-09: 1 mg via INTRAVENOUS

## 2021-11-09 MED ORDER — HEPARIN SODIUM (PORCINE) 1000 UNIT/ML IJ SOLN
INTRAMUSCULAR | Status: DC | PRN
  Administered 2021-11-09: 4500 [IU] via INTRAVENOUS

## 2021-11-09 MED ORDER — HEPARIN (PORCINE) IN NACL 1000-0.9 UT/500ML-% IV SOLN
INTRAVENOUS | Status: AC
Start: 2021-11-09 — End: ?
  Filled 2021-11-09: qty 500

## 2021-11-09 MED ORDER — LIDOCAINE HCL (PF) 1 % IJ SOLN
INTRAMUSCULAR | Status: AC
  Filled 2021-11-09: qty 30

## 2021-11-09 MED ORDER — SODIUM CHLORIDE 0.9 % WEIGHT BASED INFUSION: 1.0000 mL/kg/h | INTRAVENOUS | Status: DC

## 2021-11-09 MED ORDER — CLOPIDOGREL BISULFATE 75 MG PO TABS: 75.0000 mg | ORAL_TABLET | Freq: Every day | ORAL | 11 refills | Status: DC

## 2021-11-09 MED ORDER — HEPARIN (PORCINE) IN NACL 1000-0.9 UT/500ML-% IV SOLN
INTRAVENOUS | Status: DC | PRN
  Administered 2021-11-09 (×2): 500 mL

## 2021-11-09 MED ORDER — SODIUM CHLORIDE 0.9% FLUSH: 3.0000 mL | INTRAVENOUS | Status: DC | PRN

## 2021-11-09 MED ORDER — SODIUM CHLORIDE 0.9 % IV SOLN: 250.0000 mL | INTRAVENOUS | Status: DC | PRN

## 2021-11-09 MED ORDER — VERAPAMIL HCL 2.5 MG/ML IV SOLN
INTRAVENOUS | Status: DC | PRN
  Administered 2021-11-09: 10 mL via INTRA_ARTERIAL

## 2021-11-09 MED ORDER — HEPARIN SODIUM (PORCINE) 1000 UNIT/ML IJ SOLN
INTRAMUSCULAR | Status: AC
  Filled 2021-11-09: qty 10

## 2021-11-09 MED ORDER — ASPIRIN 81 MG PO CHEW
81.0000 mg | CHEWABLE_TABLET | ORAL | Status: AC
  Administered 2021-11-09: 81 mg via ORAL
  Filled 2021-11-09: qty 1

## 2021-11-09 MED ORDER — SODIUM CHLORIDE 0.9% FLUSH: 3.0000 mL | Freq: Two times a day (BID) | INTRAVENOUS | Status: DC

## 2021-11-09 MED ORDER — LIDOCAINE HCL (PF) 1 % IJ SOLN
INTRAMUSCULAR | Status: DC | PRN
  Administered 2021-11-09: 2 mL

## 2021-11-09 MED ORDER — CLOPIDOGREL BISULFATE 300 MG PO TABS: 300.0000 mg | ORAL_TABLET | Freq: Once | ORAL | Status: DC

## 2021-11-09 MED ORDER — VERAPAMIL HCL 2.5 MG/ML IV SOLN
INTRAVENOUS | Status: AC
Start: 2021-11-09 — End: ?
  Filled 2021-11-09: qty 2

## 2021-11-09 SURGICAL SUPPLY — 15 items
BAND CMPR LRG ZPHR (HEMOSTASIS) ×1
BAND ZEPHYR COMPRESS 30 LONG (HEMOSTASIS) ×1 IMPLANT
CATH 5FR JL3.5 JR4 ANG PIG MP (CATHETERS) ×1 IMPLANT
CATH INFINITI 5 FR IM (CATHETERS) ×1 IMPLANT
CATH INFINITI 5 FR LCB (CATHETERS) ×1 IMPLANT
CATH INFINITI 5FR AL1 (CATHETERS) ×1 IMPLANT
CATH INFINITI 5FR MPB2 (CATHETERS) ×1 IMPLANT
GLIDESHEATH SLEND A-KIT 6F 22G (SHEATH) ×1 IMPLANT
GUIDEWIRE INQWIRE 1.5J.035X260 (WIRE) IMPLANT
INQWIRE 1.5J .035X260CM (WIRE) ×2
KIT HEART LEFT (KITS) ×2 IMPLANT
PACK CARDIAC CATHETERIZATION (CUSTOM PROCEDURE TRAY) ×2 IMPLANT
SHEATH PROBE COVER 6X72 (BAG) ×1 IMPLANT
TRANSDUCER W/STOPCOCK (MISCELLANEOUS) ×2 IMPLANT
TUBING CIL FLEX 10 FLL-RA (TUBING) ×2 IMPLANT

## 2021-11-09 NOTE — Discharge Instructions (Signed)

## 2021-11-09 NOTE — CV Procedure (Signed)
New total occlusion of saphenous vein graft to the diagonal which also supplied LAD territory 80% mid body stenosis of the saphenous vein graft to supplies a small first obtuse marginal.  Unfavorable engagement angle from right radial approach.  If he continues to have symptoms and we suspect this small marginal is responsible, will need to have PCI on the graft performed from the left radial or femoral approach. Patent saphenous vein graft to the distal right coronary Patent circumflex with diffuse disease in the midsegment and in the second obtuse marginal. Total occlusion, ostial of the native right coronary Anteroapical moderate hypokinesis.  Likely correlates with recent occlusion of the saphenous vein graft to diagonal/LAD.  Patient's symptoms have completely resolved.  He is able to walk without difficulty.  Plan clinical observation but if angina continues will return to do PCI on the graft to the obtuse marginal.

## 2021-11-09 NOTE — Interval H&P Note (Signed)
Cath Lab Visit (complete for each Cath Lab visit)  Clinical Evaluation Leading to the Procedure:   ACS: Yes.    Non-ACS:    Anginal Classification: CCS Mccall  Anti-ischemic medical therapy: Maximal Therapy (2 or more classes of medications)  Non-Invasive Test Results: No non-invasive testing performed  Prior CABG: Previous CABG      History and Physical Interval Note:  11/09/2021 3:32 PM  Christian Mccall  has presented today for surgery, with the diagnosis of angina.  The various methods of treatment have been discussed with the patient and family. After consideration of risks, benefits and other options for treatment, the patient has consented to  Procedure(s): LEFT HEART CATH AND CORS/GRAFTS ANGIOGRAPHY (N/A) as a surgical intervention.  The patient's history has been reviewed, patient examined, no change in status, stable for surgery.  I have reviewed the patient's chart and labs.  Questions were answered to the patient's satisfaction.     Christian Mccall

## 2021-11-12 ENCOUNTER — Encounter (HOSPITAL_COMMUNITY): Payer: Self-pay | Admitting: Interventional Cardiology

## 2021-11-28 ENCOUNTER — Telehealth: Payer: Self-pay | Admitting: Interventional Cardiology

## 2021-11-28 ENCOUNTER — Other Ambulatory Visit: Payer: Self-pay

## 2021-11-28 MED ORDER — RANOLAZINE ER 1000 MG PO TB12: ORAL_TABLET | ORAL | 0 refills | Status: DC

## 2021-11-28 NOTE — Telephone Encounter (Signed)
*  STAT* If patient is at the pharmacy, call can be transferred to refill team.   1. Which medications need to be refilled? (please list name of each medication and dose if known)  ranolazine (RANEXA) 1000 MG SR tablet  2. Which pharmacy/location (including street and city if local pharmacy) is medication to be sent to? [place], [place]  3. Do they need a 30 day or 90 day supply? 90 day supply   Patient is almost out of medication.

## 2021-12-03 ENCOUNTER — Encounter: Payer: Self-pay | Admitting: Physician Assistant

## 2021-12-03 DIAGNOSIS — D696 Thrombocytopenia, unspecified: Secondary | ICD-10-CM | POA: Insufficient documentation

## 2021-12-03 DIAGNOSIS — I5022 Chronic systolic (congestive) heart failure: Secondary | ICD-10-CM | POA: Insufficient documentation

## 2021-12-03 HISTORY — DX: Chronic systolic (congestive) heart failure: I50.22

## 2021-12-03 NOTE — Progress Notes (Addendum)
Cardiology Office Note:    Date:  12/04/2021   ID:  [name], DOB Jul 23, 1955, MRN [id number]  PCP:  Creola Corn, MD  [facility] Providers Cardiologist:  Lesleigh Noe, MD    Referring MD: Creola Corn, MD   Chief Complaint:  Hospitalization Follow-up (S/p cardiac catheterization )    Patient Profile: Coronary artery disease  Hx of MI S/p CABG in 2002  Cath 2020: S-RCA, S-OM1, S-D1 patent; L-LAD atretic vs CTO Cath 11/2021: SVG-RCA patent, SVG-OM1 70-80, SVG-D1 occluded (new), LIMA-LAD occluded >>med Rx Abdominal aortic aneurysm  S/p endovascular repair in 2012 Known endoleak (followed by interventional radiology) Carotid artery disease  Diabetes mellitus  Hx of CVA Chronic Obstructive Pulmonary Disease OSA Hypertension  Hyperlipidemia  Diplopia  GERD  Thrombocytopenia   Prior CV Studies: LEFT HEART CATH AND CORS/GRAFTS ANGIOGRAPHY 11/09/2021 LM dist/LAD ost 90 LAD ost to prox 70, prox 100 LCx prox 70; OM1 100, OM2 75 RCA ost 100 L-R collats Patent SVG-distal RCA. Patent SVG-OM1 - diffusely diseased and has a relatively focal mid 70 to 80%  New total occlusion of the saphenous vein graft to the first diagonal which retrofilled the LAD. Known total occlusion of LIMA to LAD (2010) EF 45-50  RECOMMENDATIONS: Consider PCI on the small and diffusely diseased first obtuse marginal saphenous vein graft territory if continued angina.     VAS US CAROTID DUPLEX BILATERAL 08/22/2021 Right Carotid: Velocities in the right ICA are consistent with a 1-39% stenosis. Left Carotid: Velocities in the left ICA are consistent with a 1-39% stenosis.   ECHO COMPLETE WO IMAGING ENHANCING AGENT 01/30/2019 EF 60-65, mild LVH, GRII DD, normal RVSF   History of Present Illness:   Christian Mccall is a 66 y.o. male with the above problem list.  He was seen by Dr. Katrinka Blazing on 11/08/21 with symptoms of chest pain concerning for unstable angina.  Cardiac catheterization was arranged.   This demonstrated new occlusion of the S-D1 which retrofilled the LAD.  The L-LAD has been known to be occluded.  The S-OM was patent with 80% mid stenosis.  The S-RCA was patent.  His symptoms had completely resolved.  Decision was made to manage him medically unless he has refractory angina.  PCI of the S-OM could be considered in that setting.  He was started on Plavix.  It was noted that his EF is down (45-50) and he would need GDMT titrated for his cardiomyopathy. He returns for f/u.  He is here with his wife.  Since discharge, he is felt well without chest discomfort, syncope, orthopnea, leg edema.  He has chronic shortness of breath related to COPD.  He did develop mild left-sided chest discomfort with radiation to his left scapula while sitting in the room.  He has had this symptom for many years without significant change.  He rates it as a 1/10.  He would not take nitroglycerin for this.    Past Medical History:  Diagnosis Date   Abdominal aortic aneurysm without rupture (HCC)    Anginal pain    Asymmetric SNHL (sensorineural hearing loss) 11/17/2019   Balanitis 11/30/2013   Bulging lumbar disc    "& some in my neck"   C. difficile colitis    2008 OR 09   CAD (coronary artery disease) of artery bypass graft 07/12/2015    Coronary angiogram 2010 :  CONCLUSION:  1. Bypass graft failure with occlusion of the SVG to the RCA.  2. Severe native disease  with occlusion of the LAD and RCA.  3. Normal left ventricular function.  4. Patent saphenous vein grafts to the diagonal and to the OM #1.  The      left internal mammary artery graft is also patent.     Cervicalgia 11/12/2012   Change in platelet count    pt states 'has been low since 2012; patient had to receive platelets in surgery in Nov 2016'   Chronic back pain    Cirrhosis    COPD (chronic obstructive pulmonary disease)    Diabetic peripheral neuropathy    Diplopia 01/30/2019   Dyspnea 02/04/2013   Emphysema lung    Endoleak of  aortic graft    Enlarged liver    Enlarged prostate    Essential hypertension, benign 05/03/2013   Genital herpes simplex 12/03/2013   GERD (gastroesophageal reflux disease)    Headache    "~ weekly" (04/12/2015)   Heart failure with mid-range ejection fraction (HFmEF) (HCC) 12/03/2021   Cath 11/2021: EF 45-50   Hemiplegia affecting nondominant side 11/12/2012   History of colon polyps    Hyperlipidemia    Impingement syndrome of left shoulder region 02/18/2018   Left facial numbness 01/30/2019   Leg pain    with walking and pain in feet while lying flat   Lumbar back pain 11/12/2012   Mild neurocognitive disorder, unclear etiology 07/31/2021   Multiple lacunar infarcts    REM sleep behaviors    Restless leg syndrome 11/12/2012   Rheumatic fever    AS A CHILD  ?6 YRS OLD   RLS (restless legs syndrome)    Shortness of breath 12/06/2010   while lying flat and with exertion   Snoring    Spleen enlarged    Syphilis, late latent 11/24/2013   Thrombocytopenia    since 2012; can be as low as 54K.    TIA (transient ischemic attack) "several"   "since 2015" (04/12/2015)   Tinnitus aurium, left 11/17/2019   Transient left leg weakness 11/12/2012   Type II diabetes mellitus 2009   Current Medications: Current Meds  Medication Sig   albuterol (PROVENTIL HFA;VENTOLIN HFA) 108 (90 BASE) MCG/ACT inhaler Inhale 2 puffs into the lungs every 6 (six) hours as needed for wheezing or shortness of breath.    aspirin EC 81 MG tablet Take 1 tablet (81 mg total) by mouth daily.   cholecalciferol (VITAMIN D3) 25 MCG (1000 UNIT) tablet Take 1,000 Units by mouth 2 (two) times daily.   clopidogrel (PLAVIX) 75 MG tablet Take 1 tablet (75 mg total) by mouth daily.   empagliflozin (JARDIANCE) 10 MG TABS tablet Take 10 mg by mouth every morning.    escitalopram (LEXAPRO) 20 MG tablet Take 20 mg by mouth at bedtime.   fenofibrate 160 MG tablet Take 160 mg by mouth every morning.    furosemide (LASIX) 40 MG  tablet Take 40 mg by mouth every other day.    glimepiride (AMARYL) 4 MG tablet Take 4 mg by mouth daily.    insulin glargine (LANTUS) 100 UNIT/ML injection Inject 70 Units into the skin at bedtime.   losartan (COZAAR) 50 MG tablet Take 50 mg by mouth daily.   metFORMIN (GLUCOPHAGE) 1000 MG tablet Take 1,000 mg by mouth every evening.   metoprolol (LOPRESSOR) 50 MG tablet Take 50 mg by mouth 2 (two) times daily.    nitroGLYCERIN (NITROSTAT) 0.4 MG SL tablet Place 1 tablet (0.4 mg total) under the tongue every 5 (five) minutes as needed  for chest pain (Do not exceed total of 3 doses in 15 minutes, call 911).   NOVOLOG FLEXPEN 100 UNIT/ML FlexPen Inject 12 Units into the skin 3 (three) times daily with meals.   pantoprazole (PROTONIX) 40 MG tablet Take 1 tablet (40 mg total) by mouth daily.   pramipexole (MIRAPEX) 0.25 MG tablet Take 0.25 mg by mouth at bedtime.    pregabalin (LYRICA) 100 MG capsule Take 200 mg by mouth every evening.   ranolazine (RANEXA) 1000 MG SR tablet TAKE 1 TABLET TWICE DAILY   rosuvastatin (CRESTOR) 20 MG tablet Take 20 mg by mouth at bedtime.   terazosin (HYTRIN) 1 MG capsule Take 1 mg by mouth at bedtime.    vitamin B-12 (CYANOCOBALAMIN) 1000 MCG tablet Take 1,000 mcg by mouth every evening.   [DISCONTINUED] lansoprazole (PREVACID) 30 MG capsule Take 30 mg by mouth every morning.     Allergies:   Patient has no known allergies.   Social History   Tobacco Use   Smoking status: Former    Packs/day: 0.00    Years: 30.00    Total pack years: 0.00    Types: Cigarettes    Quit date: 02/17/2001    Years since quitting: 20.8   Smokeless tobacco: Never  Vaping Use   Vaping Use: Never used  Substance Use Topics   Alcohol use: Yes    Alcohol/week: 1.0 standard drink of alcohol    Types: 1 Cans of beer per week    Comment: occasional   Drug use: No    Family Hx: The patient's family history includes Aneurysm in his maternal aunt, maternal uncle, and mother; Cancer  in his sister; Heart attack in his brother, brother, and brother; Heart disease in his brother, brother, mother, and sister; Hyperlipidemia in his father and mother; Hypertension in his brother, father, and mother; Lupus in his sister; Other in his maternal aunt, maternal uncle, and mother.  Review of Systems  Constitutional: Negative for fever.  Respiratory:  Negative for cough.   Hematologic/Lymphatic: Does not bruise/bleed easily.  Gastrointestinal:  Negative for hematochezia and melena.  Genitourinary:  Negative for hematuria.     EKGs/Labs/Other Test Reviewed:    EKG:  EKG is  ordered today.  The ekg ordered today demonstrates sinus bradycardia, HR 54, leftward axis, anterior Q waves, T wave inversions 1, aVL, V1-V4, QTc 462  Recent Labs: 02/26/2021: TSH 1.370 11/08/2021: BUN 11; Creatinine, Ser 0.87; Hemoglobin 16.3; Platelets 89; Potassium 4.0; Sodium 138   Recent Lipid Panel No results for input(s): "CHOL", "TRIG", "HDL", "VLDL", "LDLCALC", "LDLDIRECT" in the last 8760 hours.   Risk Assessment/Calculations/Metrics:              Physical Exam:    VS:  BP 104/60   Pulse (!) 56   Ht 5\' 11"  (1.803 m)   Wt 201 lb (91.2 kg)   SpO2 96%   BMI 28.03 kg/m     Wt Readings from Last 3 Encounters:  12/04/21 201 lb (91.2 kg)  11/09/21 199 lb (90.3 kg)  11/08/21 199 lb 12.8 oz (90.6 kg)    Constitutional:      Appearance: Healthy appearance. Not in distress.  Neck:     Vascular: No JVR. JVD normal.  Pulmonary:     Effort: Pulmonary effort is normal.     Breath sounds: No wheezing. No rales.  Cardiovascular:     Normal rate. Regular rhythm. Normal S1. Normal S2.      Murmurs: There is no  murmur.     Comments: Right wrist without hematoma Edema:    Peripheral edema absent.  Abdominal:     Palpations: Abdomen is soft.  Skin:    General: Skin is warm and dry.  Neurological:     General: No focal deficit present.     Mental Status: Alert and oriented to person, place and  time.         ASSESSMENT & PLAN:   CAD (coronary artery disease) of artery bypass graft Hx of prior MI and subsequent CABG in 2002.  He has a known occlusion of the L-LAD.  Cardiac catheterization was done recently that demonstrated a new occlusion of the S-D1 and 70-80% stenosis of the S-OM. He was symptom free when he had his cath and decision was made to treat medically.  He is having some chest pain in the office today.  This is his typical angina without significant change over many years.  EKG today demonstrates anterolateral T wave inversions which is likely related to his diagonal graft occlusion. His BP will not allow further titration of antianginal medications. He does have a hx of thrombocytopenia.  11/08/2021: Platelets 89.  I reviewed his case today with Dr. Katrinka Blazing.  He reviewed his films again.  Options for PCI or somewhat limited.  Medical therapy will be continued for now.  If he has an escalation in symptoms, then PCI of the vein graft to the OM could be considered. Continue ASA 81 mg once daily, Plavix 75 mg once daily, metoprolol tartrate 50 mg twice daily, ranolazine 1000 mg twice daily, Rosuvastatin 20 mg once daily  Nitroglycerin as needed Follow-up with Dr. Katrinka Blazing in 3 months Return sooner if symptoms worsen  Heart failure with mid-range ejection fraction (HFmEF) (HCC) EF 45-50 by left ventriculogram.  NYHA II-IIb.  His shortness of breath is mainly due to Chronic Obstructive Pulmonary Disease.  Volume status currently stable.  BP will limit ability to titrate GDMT.   Continue empagliflozin 10 mg daily, Lasix 40 mg every other day, losartan 50 mg daily, metoprolol tartrate 50 mg twice daily  Essential hypertension, benign The patient's blood pressure is controlled on his current regimen.  Continue current therapy.   Hyperlipidemia Labs from [facility] tool personally reviewed and interpreted.  05/18/2021: Total cholesterol 147, HDL 31, LDL 67, triglycerides 243. LDL optimal.   Continue rosuvastatin 20 mg daily, fenofibrate 160 mg daily.  If triglycerides remain elevated, consider the addition of Vascepa.  Abdominal aortic aneurysm without rupture Status postrepair with known endoleak.  This has been followed by interventional radiology.  We have provided him with a phone number to call to get his follow-up appointment scheduled.  Thrombocytopenia (HCC) Most recent platelet count 89,000.  Arrange follow-up CBC in the next 3 to 4 weeks given the addition of clopidogrel.  GERD (gastroesophageal reflux disease) As he was recently placed on clopidogrel, I will stop his lansoprazole and replace it with pantoprazole 40 mg daily.           Dispo:  Return in about 3 months (around 03/06/2022) for Routine Follow Up with Dr. Katrinka Blazing.   Medication Adjustments/Labs and Tests Ordered: Current medicines are reviewed at length with the patient today.  Concerns regarding medicines are outlined above.  Tests Ordered: Orders Placed This Encounter  Procedures   CBC   EKG 12-Lead   Medication Changes: Meds ordered this encounter  Medications   pantoprazole (PROTONIX) 40 MG tablet    Sig: Take 1 tablet (40 mg total)  by mouth daily.    Dispense:  30 tablet    Refill:  [address] [place], [place]  12/04/2021 10:17 AM    [facility] [address] [place], [place], [place]  [address] Phone: [phone]; Fax: [phone]

## 2021-12-04 ENCOUNTER — Encounter: Payer: Self-pay | Admitting: Physician Assistant

## 2021-12-04 ENCOUNTER — Ambulatory Visit: Payer: Medicare HMO | Admitting: Physician Assistant

## 2021-12-04 VITALS — BP 104/60 | HR 56 | Ht 71.0 in | Wt 201.0 lb

## 2021-12-04 DIAGNOSIS — I10 Essential (primary) hypertension: Secondary | ICD-10-CM | POA: Diagnosis not present

## 2021-12-04 DIAGNOSIS — I5022 Chronic systolic (congestive) heart failure: Secondary | ICD-10-CM

## 2021-12-04 DIAGNOSIS — K219 Gastro-esophageal reflux disease without esophagitis: Secondary | ICD-10-CM | POA: Diagnosis not present

## 2021-12-04 DIAGNOSIS — I5020 Unspecified systolic (congestive) heart failure: Secondary | ICD-10-CM

## 2021-12-04 DIAGNOSIS — E7800 Pure hypercholesterolemia, unspecified: Secondary | ICD-10-CM

## 2021-12-04 DIAGNOSIS — I25708 Atherosclerosis of coronary artery bypass graft(s), unspecified, with other forms of angina pectoris: Secondary | ICD-10-CM | POA: Diagnosis not present

## 2021-12-04 DIAGNOSIS — I714 Abdominal aortic aneurysm, without rupture, unspecified: Secondary | ICD-10-CM

## 2021-12-04 DIAGNOSIS — D696 Thrombocytopenia, unspecified: Secondary | ICD-10-CM

## 2021-12-04 MED ORDER — PANTOPRAZOLE SODIUM 40 MG PO TBEC: 40.0000 mg | DELAYED_RELEASE_TABLET | Freq: Every day | ORAL | 11 refills | Status: DC

## 2021-12-04 NOTE — Assessment & Plan Note (Signed)
The patient's blood pressure is controlled on his current regimen.  Continue current therapy.  

## 2021-12-04 NOTE — Assessment & Plan Note (Signed)
Labs from [facility] tool personally reviewed and interpreted.  05/18/2021: Total cholesterol 147, HDL 31, LDL 67, triglycerides 243. LDL optimal.  Continue rosuvastatin 20 mg daily, fenofibrate 160 mg daily.  If triglycerides remain elevated, consider the addition of Vascepa.

## 2021-12-07 DIAGNOSIS — E785 Hyperlipidemia, unspecified: Secondary | ICD-10-CM | POA: Diagnosis not present

## 2021-12-07 DIAGNOSIS — J449 Chronic obstructive pulmonary disease, unspecified: Secondary | ICD-10-CM | POA: Diagnosis not present

## 2021-12-07 DIAGNOSIS — I119 Hypertensive heart disease without heart failure: Secondary | ICD-10-CM | POA: Diagnosis not present

## 2021-12-07 DIAGNOSIS — E11319 Type 2 diabetes mellitus with unspecified diabetic retinopathy without macular edema: Secondary | ICD-10-CM | POA: Diagnosis not present

## 2021-12-07 DIAGNOSIS — I2510 Atherosclerotic heart disease of native coronary artery without angina pectoris: Secondary | ICD-10-CM | POA: Diagnosis not present

## 2021-12-25 ENCOUNTER — Telehealth: Payer: Self-pay | Admitting: *Deleted

## 2021-12-25 ENCOUNTER — Other Ambulatory Visit: Payer: Medicare HMO

## 2021-12-25 DIAGNOSIS — I25708 Atherosclerosis of coronary artery bypass graft(s), unspecified, with other forms of angina pectoris: Secondary | ICD-10-CM | POA: Diagnosis not present

## 2021-12-25 DIAGNOSIS — Z79899 Other long term (current) drug therapy: Secondary | ICD-10-CM

## 2021-12-25 DIAGNOSIS — I10 Essential (primary) hypertension: Secondary | ICD-10-CM | POA: Diagnosis not present

## 2021-12-25 DIAGNOSIS — D696 Thrombocytopenia, unspecified: Secondary | ICD-10-CM

## 2021-12-25 DIAGNOSIS — E7800 Pure hypercholesterolemia, unspecified: Secondary | ICD-10-CM | POA: Diagnosis not present

## 2021-12-25 DIAGNOSIS — I5022 Chronic systolic (congestive) heart failure: Secondary | ICD-10-CM | POA: Diagnosis not present

## 2021-12-25 LAB — CBC
Hematocrit: 43.4 % (ref 37.5–51.0)
Hemoglobin: 14.7 g/dL (ref 13.0–17.7)
MCH: 31.9 pg (ref 26.6–33.0)
MCHC: 33.9 g/dL (ref 31.5–35.7)
MCV: 94 fL (ref 79–97)
Platelets: 55 10*3/uL — CL (ref 150–450)
RBC: 4.61 x10E6/uL (ref 4.14–5.80)
RDW: 14.6 % (ref 11.6–15.4)
WBC: 4.8 10*3/uL (ref 3.4–10.8)

## 2021-12-25 NOTE — Telephone Encounter (Signed)
-----   Message from Liliane Shi, PA-C sent at 12/25/2021  1:58 PM EDT ----- Platelet count low but appears stable compared to historical values. PLAN:  - Continue current medications/treatment plan and follow up as scheduled.  - Repeat CBC in 4 weeks to keep close eye on Plt count with Plavix Rx - Ask if he is having any significant bruising or bleeding  - Will send to Dr. Tamala Julian as Juluis Rainier as well. Richardson Dopp, PA-C    12/25/2021 1:56 PM

## 2022-01-07 DIAGNOSIS — I119 Hypertensive heart disease without heart failure: Secondary | ICD-10-CM | POA: Diagnosis not present

## 2022-01-07 DIAGNOSIS — J449 Chronic obstructive pulmonary disease, unspecified: Secondary | ICD-10-CM | POA: Diagnosis not present

## 2022-01-07 DIAGNOSIS — E11319 Type 2 diabetes mellitus with unspecified diabetic retinopathy without macular edema: Secondary | ICD-10-CM | POA: Diagnosis not present

## 2022-01-07 DIAGNOSIS — E785 Hyperlipidemia, unspecified: Secondary | ICD-10-CM | POA: Diagnosis not present

## 2022-01-07 DIAGNOSIS — I2510 Atherosclerotic heart disease of native coronary artery without angina pectoris: Secondary | ICD-10-CM | POA: Diagnosis not present

## 2022-01-09 ENCOUNTER — Other Ambulatory Visit: Payer: Self-pay | Admitting: Interventional Cardiology

## 2022-01-12 DIAGNOSIS — E11319 Type 2 diabetes mellitus with unspecified diabetic retinopathy without macular edema: Secondary | ICD-10-CM | POA: Diagnosis not present

## 2022-01-22 ENCOUNTER — Other Ambulatory Visit: Payer: Self-pay

## 2022-01-22 ENCOUNTER — Other Ambulatory Visit: Payer: Medicare HMO

## 2022-01-22 DIAGNOSIS — Z79899 Other long term (current) drug therapy: Secondary | ICD-10-CM

## 2022-01-22 MED ORDER — PANTOPRAZOLE SODIUM 40 MG PO TBEC: 40.0000 mg | DELAYED_RELEASE_TABLET | Freq: Every day | ORAL | 3 refills | Status: AC

## 2022-01-22 MED ORDER — CLOPIDOGREL BISULFATE 75 MG PO TABS: 75.0000 mg | ORAL_TABLET | Freq: Every day | ORAL | 3 refills | Status: AC

## 2022-01-23 LAB — CBC
Hematocrit: 45.5 % (ref 37.5–51.0)
Hemoglobin: 15.1 g/dL (ref 13.0–17.7)
MCH: 31.9 pg (ref 26.6–33.0)
MCHC: 33.2 g/dL (ref 31.5–35.7)
MCV: 96 fL (ref 79–97)
Platelets: 62 10*3/uL — CL (ref 150–450)
RBC: 4.73 x10E6/uL (ref 4.14–5.80)
RDW: 13.5 % (ref 11.6–15.4)
WBC: 5.5 10*3/uL (ref 3.4–10.8)

## 2022-01-28 ENCOUNTER — Ambulatory Visit (HOSPITAL_COMMUNITY): Payer: Medicare HMO

## 2022-01-29 ENCOUNTER — Ambulatory Visit (HOSPITAL_COMMUNITY)
Admission: RE | Admit: 2022-01-29 | Discharge: 2022-01-29 | Disposition: A | Discharge status: Home / Self Care | Payer: Medicare HMO | Source: Ambulatory Visit | Attending: Interventional Radiology | Admitting: Interventional Radiology

## 2022-01-29 DIAGNOSIS — I9789 Other postprocedural complications and disorders of the circulatory system, not elsewhere classified: Secondary | ICD-10-CM | POA: Diagnosis not present

## 2022-01-29 DIAGNOSIS — K828 Other specified diseases of gallbladder: Secondary | ICD-10-CM | POA: Diagnosis not present

## 2022-01-29 DIAGNOSIS — N400 Enlarged prostate without lower urinary tract symptoms: Secondary | ICD-10-CM | POA: Diagnosis not present

## 2022-01-29 DIAGNOSIS — K7689 Other specified diseases of liver: Secondary | ICD-10-CM | POA: Diagnosis not present

## 2022-01-29 DIAGNOSIS — Z794 Long term (current) use of insulin: Secondary | ICD-10-CM | POA: Insufficient documentation

## 2022-01-29 DIAGNOSIS — E1142 Type 2 diabetes mellitus with diabetic polyneuropathy: Secondary | ICD-10-CM | POA: Insufficient documentation

## 2022-01-29 DIAGNOSIS — I714 Abdominal aortic aneurysm, without rupture, unspecified: Secondary | ICD-10-CM | POA: Diagnosis not present

## 2022-01-29 LAB — POCT I-STAT CREATININE: Creatinine, Ser: 1 mg/dL (ref 0.61–1.24)

## 2022-01-29 MED ORDER — IOHEXOL 350 MG/ML SOLN
100.0000 mL | Freq: Once | INTRAVENOUS | Status: AC | PRN
Start: 2022-01-29 — End: 2022-01-29
  Administered 2022-01-29: 100 mL via INTRAVENOUS

## 2022-02-05 ENCOUNTER — Ambulatory Visit
Admission: RE | Admit: 2022-02-05 | Discharge: 2022-02-05 | Disposition: A | Discharge status: Home / Self Care | Payer: Medicare HMO | Source: Ambulatory Visit | Attending: Interventional Radiology | Admitting: Interventional Radiology

## 2022-02-05 DIAGNOSIS — I714 Abdominal aortic aneurysm, without rupture, unspecified: Secondary | ICD-10-CM | POA: Diagnosis not present

## 2022-02-05 DIAGNOSIS — Z9889 Other specified postprocedural states: Secondary | ICD-10-CM | POA: Diagnosis not present

## 2022-02-05 DIAGNOSIS — I9789 Other postprocedural complications and disorders of the circulatory system, not elsewhere classified: Secondary | ICD-10-CM

## 2022-02-05 HISTORY — PX: IR RADIOLOGIST EVAL & MGMT: IMG5224

## 2022-02-05 NOTE — Progress Notes (Signed)
Chief Complaint: Patient was seen in consultation today for history of Type 2 endoleak at the request of [name]  Referring Physician(s): Ruta Hinds, MD  History of Present Illness: Christian Mccall is a 66 y.o. male history of cirrhosis, CAD, MI, CVA, COPD, HLD, HTN, DM, and AAA with type II endoleak and previous repair in November of 2016.      Surveillance imaging demonstrated recurrent type II endoleak caudal to the prior endovascular repair. Despite having a stable size in the aneurysm sac, there was concern for outpouching in the left posterolateral aspect of the sac in the region of the new endoleak.   He underwent a direct sac puncture with endoleak repair using liquid embolic on 2/62/0355.  Surveillance imaging since that time 09/28/20 and 01/29/22 has demonstrates no further enlargement of the excluded aneurysm.       Christian Mccall comes in today with his wife accompanying him.  He is relieved to hear the good news about his aneurysm and repaired endoleak.  He reports that he has been having intermittent back pain which radiates around both flanks along the margin of the lower ribs.  This happens intermittently and seems to be associated with him moving or bending a certain way.  The pain is quite intense but short-lived and resolves on its own.  We reviewed his imaging together carefully.  I see no evidence of a pathologic fracture, focal degenerative changes, mass lesion or other concerning abnormality.   Past Medical History:  Diagnosis Date   Abdominal aortic aneurysm without rupture (HCC)    Anginal pain    Asymmetric SNHL (sensorineural hearing loss) 11/17/2019   Balanitis 11/30/2013   Bulging lumbar disc    "& some in my neck"   C. difficile colitis    2008 OR 09   CAD (coronary artery disease) of artery bypass graft 07/12/2015    Coronary angiogram 2010 :  CONCLUSION:  1. Bypass graft failure with occlusion of the SVG to the RCA.  2. Severe native  disease with occlusion of the LAD and RCA.  3. Normal left ventricular function.  4. Patent saphenous vein grafts to the diagonal and to the OM #1.  The      left internal mammary artery graft is also patent.     Cervicalgia 11/12/2012   Change in platelet count    pt states 'has been low since 2012; patient had to receive platelets in surgery in Nov 2016'   Chronic back pain    Cirrhosis    COPD (chronic obstructive pulmonary disease)    Diabetic peripheral neuropathy    Diplopia 01/30/2019   Dyspnea 02/04/2013   Emphysema lung    Endoleak of aortic graft    Enlarged liver    Enlarged prostate    Essential hypertension, benign 05/03/2013   Genital herpes simplex 12/03/2013   GERD (gastroesophageal reflux disease)    Headache    "~ weekly" (04/12/2015)   Heart failure with mid-range ejection fraction (HFmEF) ([place]) 12/03/2021   Cath 11/2021: EF 45-50   Hemiplegia affecting nondominant side 11/12/2012   History of colon polyps    Hyperlipidemia    Impingement syndrome of left shoulder region 02/18/2018   Left facial numbness 01/30/2019   Leg pain    with walking and pain in feet while lying flat   Lumbar back pain 11/12/2012   Mild neurocognitive disorder, unclear etiology 07/31/2021   Multiple lacunar infarcts    REM sleep behaviors  Restless leg syndrome 11/12/2012   Rheumatic fever    AS A CHILD  ?6 YRS OLD   RLS (restless legs syndrome)    Shortness of breath 12/06/2010   while lying flat and with exertion   Snoring    Spleen enlarged    Syphilis, late latent 11/24/2013   Thrombocytopenia    since 2012; can be as low as 54K.    TIA (transient ischemic attack) "several"   "since 2015" (04/12/2015)   Tinnitus aurium, left 11/17/2019   Transient left leg weakness 11/12/2012   Type II diabetes mellitus 2009    Past Surgical History:  Procedure Laterality Date   ABDOMINAL AORTIC ANEURYSM REPAIR  01/16/2011   EVAR   ABDOMINAL AORTIC ANEURYSM REPAIR  04/12/2015   CARDIAC  CATHETERIZATION  "several"   COLONOSCOPY     COLONOSCOPY W/ BIOPSIES     COLONOSCOPY WITH PROPOFOL N/A 03/21/2016   Procedure: COLONOSCOPY WITH PROPOFOL;  Surgeon: Arta Silence, MD;  Location: [facility] ENDOSCOPY;  Service: Endoscopy;  Laterality: N/A;   CORONARY ANGIOPLASTY WITH STENT PLACEMENT  "several"   > 4 sents   CORONARY ARTERY BYPASS GRAFT  02/18/2001   "CABG x 4"   Direct sac puncture with liquid embolic repair of Type II endoleak  04/12/2015; 03/05/2017   ESOPHAGOGASTRODUODENOSCOPY (EGD) WITH PROPOFOL N/A 03/21/2016   Procedure: ESOPHAGOGASTRODUODENOSCOPY (EGD) WITH PROPOFOL;  Surgeon: Arta Silence, MD;  Location: [place];  Service: Endoscopy;  Laterality: N/A;   IR AORTAGRAM ABDOMINAL SERIALOGRAM  03/05/2017   IR CT SPINE LTD  03/05/2017   IR EMBO ARTERIAL NOT HEMORR HEMANG INC GUIDE ROADMAPPING  03/05/2017   IR RADIOLOGIST EVAL & MGMT  02/05/2017   IR RADIOLOGIST EVAL & MGMT  04/01/2017   IR RADIOLOGIST EVAL & MGMT  10/01/2017   IR RADIOLOGIST EVAL & MGMT  02/04/2019   IR RADIOLOGIST EVAL & MGMT  09/15/2019   IR RADIOLOGIST EVAL & MGMT  10/03/2020   LEFT HEART CATH AND CORS/GRAFTS ANGIOGRAPHY N/A 11/13/2018   Procedure: LEFT HEART CATH AND CORS/GRAFTS ANGIOGRAPHY;  Surgeon: Belva Crome, MD;  Location: [place] [facility];  Service: Cardiovascular;  Laterality: N/A;   LEFT HEART CATH AND CORS/GRAFTS ANGIOGRAPHY N/A 11/09/2021   Procedure: LEFT HEART CATH AND CORS/GRAFTS ANGIOGRAPHY;  Surgeon: Belva Crome, MD;  Location: [place] [facility];  Service: Cardiovascular;  Laterality: N/A;   PERIPHERAL VASCULAR CATHETERIZATION N/A 03/10/2015   Procedure: Abdominal Aortogram;  Surgeon: Elam Dutch, MD;  Location: [place] [facility];  Service: Cardiovascular;  Laterality: N/A;   RADIOLOGY WITH ANESTHESIA N/A 04/12/2015   Procedure: embolization;  Surgeon: Jacqulynn Cadet, MD;  Location: [place];  Service: Radiology;  Laterality: N/A;   RADIOLOGY WITH ANESTHESIA N/A 03/05/2017   Procedure:  Type II Endoleak Repair;  Surgeon: Jacqulynn Cadet, MD;  Location: [place];  Service: Radiology;  Laterality: N/A;   SHOULDER SURGERY Right 07-27-2015   TONSILLECTOMY  ~ 1965    Allergies: Patient has no known allergies.  Medications: Prior to Admission medications   Medication Sig Start Date End Date Taking? Authorizing Provider  albuterol (PROVENTIL HFA;VENTOLIN HFA) 108 (90 BASE) MCG/ACT inhaler Inhale 2 puffs into the lungs every 6 (six) hours as needed for wheezing or shortness of breath.     [provider]  aspirin EC 81 MG tablet Take 1 tablet (81 mg total) by mouth daily. 03/22/16   Arta Silence, MD  cholecalciferol (VITAMIN D3) 25 MCG (1000 UNIT) tablet Take 1,000 Units by  mouth 2 (two) times daily.    [provider]  clopidogrel (PLAVIX) 75 MG tablet Take 1 tablet (75 mg total) by mouth daily. 01/22/22   Belva Crome, MD  empagliflozin (JARDIANCE) 10 MG TABS tablet Take 10 mg by mouth every morning.     [provider]  escitalopram (LEXAPRO) 20 MG tablet Take 20 mg by mouth at bedtime.    [provider]  fenofibrate 160 MG tablet Take 160 mg by mouth every morning.  11/03/10   [provider]  furosemide (LASIX) 40 MG tablet Take 40 mg by mouth every other day.     [provider]  glimepiride (AMARYL) 4 MG tablet Take 4 mg by mouth daily.     [provider]  insulin glargine (LANTUS) 100 UNIT/ML injection Inject 70 Units into the skin at bedtime.    [provider]  losartan (COZAAR) 50 MG tablet Take 50 mg by mouth daily.    [provider]  metFORMIN (GLUCOPHAGE) 1000 MG tablet Take 1,000 mg by mouth every evening.    [provider]  metoprolol (LOPRESSOR) 50 MG tablet Take 50 mg by mouth 2 (two) times daily.     [provider]  nitroGLYCERIN (NITROSTAT) 0.4 MG SL tablet Place 1 tablet (0.4 mg total) under the tongue every 5 (five) minutes as needed for chest pain (Do not  exceed total of 3 doses in 15 minutes, call 911). 09/10/21   Belva Crome, MD  NOVOLOG FLEXPEN 100 UNIT/ML FlexPen Inject 12 Units into the skin 3 (three) times daily with meals. 09/04/21   [provider]  pantoprazole (PROTONIX) 40 MG tablet Take 1 tablet (40 mg total) by mouth daily. 01/22/22   Belva Crome, MD  pramipexole (MIRAPEX) 0.25 MG tablet Take 0.25 mg by mouth at bedtime.  03/24/12   [provider]  pregabalin (LYRICA) 100 MG capsule Take 200 mg by mouth every evening.    [provider]  ranolazine (RANEXA) 1000 MG SR tablet TAKE 1 TABLET TWICE DAILY 01/09/22   Richardson Dopp T, PA-C  rosuvastatin (CRESTOR) 20 MG tablet Take 20 mg by mouth at bedtime. 09/01/19   [provider]  terazosin (HYTRIN) 1 MG capsule Take 1 mg by mouth at bedtime.     [provider]  vitamin B-12 (CYANOCOBALAMIN) 1000 MCG tablet Take 1,000 mcg by mouth every evening.    [provider]     Family History  Problem Relation Age of Onset   Aneurysm Mother        AAA   Heart disease Mother        Heart Disease before age 53   Other Mother        Carotid artery stenosis   Hyperlipidemia Mother    Hypertension Mother    Lupus Sister    Cancer Sister        Ovarian   Heart disease Sister    Heart disease Brother        Heart Disease before age 5   Hypertension Brother    Heart attack Brother    Heart disease Brother        Before age 59   Heart attack Brother    Heart attack Brother    Hyperlipidemia Father    Hypertension Father    Aneurysm Maternal Aunt        AAA   Other Maternal Aunt        AAA  Aneurysm Maternal Uncle        AAA   Other Maternal Uncle        AAA    Social History   Socioeconomic History   Marital status: Married    Spouse name: reba   Number of children: 2   Years of education: 9   Highest education level: 9th grade  Occupational History   Occupation: Retired/Disability  Tobacco Use   Smoking status:  Former    Packs/day: 0.00    Years: 30.00    Total pack years: 0.00    Types: Cigarettes    Quit date: 02/17/2001    Years since quitting: 20.9   Smokeless tobacco: Never  Vaping Use   Vaping Use: Never used  Substance and Sexual Activity   Alcohol use: Yes    Alcohol/week: 1.0 standard drink of alcohol    Types: 1 Cans of beer per week    Comment: occasional   Drug use: No   Sexual activity: Not Currently  Other Topics Concern   Not on file  Social History Narrative   Patient lives at home with his wife.    Right Handed    Lives in a one story motor home    Social Determinants of Health   Financial Resource Strain: Not on file  Food Insecurity: Not on file  Transportation Needs: Not on file  Physical Activity: Not on file  Stress: Not on file  Social Connections: Not on file    Review of Systems: A 12 point ROS discussed and pertinent positives are indicated in the HPI above.  All other systems are negative.  Review of Systems  Vital Signs: BP 134/60 (BP Location: Right Arm)   Pulse (!) 54   SpO2 95%    Physical Exam Constitutional:      General: He is not in acute distress.    Appearance: Normal appearance.  HENT:     Head: Normocephalic and atraumatic.  Eyes:     General: No scleral icterus. Cardiovascular:     Rate and Rhythm: Normal rate.  Pulmonary:     Effort: Pulmonary effort is normal.  Abdominal:     General: There is no distension.     Palpations: Abdomen is soft.     Tenderness: There is no abdominal tenderness.  Skin:    General: Skin is warm and dry.  Neurological:     General: No focal deficit present.     Mental Status: He is alert and oriented to person, place, and time.  Psychiatric:        Behavior: Behavior normal.       Imaging: CT Angio Abd/Pel w/ and/or w/o  Result Date: 01/31/2022 CLINICAL DATA:  Aortic graft endoleak EXAM: CTA ABDOMEN AND PELVIS WITHOUT AND WITH CONTRAST TECHNIQUE: Multidetector CT imaging of the  abdomen and pelvis was performed using the standard protocol during bolus administration of intravenous contrast. Multiplanar reconstructed images and MIPs were obtained and reviewed to evaluate the vascular anatomy. RADIATION DOSE REDUCTION: This exam was performed according to the departmental dose-optimization program which includes automated exposure control, adjustment of the mA and/or kV according to patient size and/or use of iterative reconstruction technique. CONTRAST:  160m OMNIPAQUE IOHEXOL 350 MG/ML SOLN COMPARISON:  09/28/2020 and previous FINDINGS: VASCULAR Aorta: Normal mild calcified atheromatous plaque in its suprarenal portion. Patent infrarenal bifurcated stent graft. Embolic material in the native aneurysm sac results in significant streak artifact degrading portions of the scan. No convincing evidence of residual/recurrent  endoleak. Native sac diameter 6.2 x 5.5 cm (Im71,Se4), previously 6.3 x 5.4 by my measurement. No evidence of rupture or impending rupture. Celiac: Calcified ostial plaque resulting in short segment stenosis of at least moderate severity, patent distally. SMA: Patent without evidence of aneurysm, dissection, vasculitis or significant stenosis. Renals: On the left there are 3, the middle dominant, all appearing patent. Single right, with calcified ostial plaque resulting in short segment stenosis of at least mild severity, patent distally. IMA: Origin occlusion, reconstituted distally by visceral collaterals. Inflow: Both limbs of the stent graft extends to distal common iliac arteries, well apposed. Scattered calcified plaque in the native external and internal iliac arteries without aneurysm or high-grade stenosis. Proximal Outflow: Atheromatous, patent Veins: No obvious venous abnormality within the limitations of this arterial phase study. Review of the MIP images confirms the above findings. NON-VASCULAR Lower chest: Sternotomy wires. Coronary calcifications. No pleural  or pericardial effusion. Hepatobiliary: Mildly nodular contour suggesting cirrhosis. No focal lesion or biliary ductal dilatation. Gallbladder physiologically distended without calcified gallstone. Pancreas: Unremarkable. No pancreatic ductal dilatation or surrounding inflammatory changes. Spleen: Enlarged up to 19.4 cm length, without focal lesion. Adrenals/Urinary Tract: No adrenal mass. Symmetric renal parenchymal enhancement without focal lesion or hydronephrosis. Urinary bladder incompletely distended. Stomach/Bowel: Stomach is nondistended, unremarkable. Small bowel decompressed. Appendix not identified. The colon is incompletely distended, unremarkable. Lymphatic: No abdominal or pelvic adenopathy. Reproductive: Mild prostate enlargement with central coarse calcification Other: No ascites.  No free air. Musculoskeletal: No acute or significant osseous findings. IMPRESSION: 1. Patent infrarenal bifurcated stent graft with stable 6.2 cm native aortic aneurysm (ICD10-I71.9) sac diameter. 2. No evidence of residual/recurrent endoleak post embolization. 3. Celiac axis origin stenosis as before, of possible hemodynamic significance. 4. Nodular hepatic contour suggesting cirrhosis, with splenomegaly. 5. Coronary and Aortic Atherosclerosis (ICD10-170.0). Electronically Signed   By: Lucrezia Europe M.D.   On: 01/31/2022 16:00    Labs:  CBC: Recent Labs    11/08/21 1605 12/25/21 1145 01/22/22 0915  WBC 6.4 4.8 5.5  HGB 16.3 14.7 15.1  HCT 48.5 43.4 45.5  PLT 89* 55* 62*    COAGS: No results for input(s): "INR", "APTT" in the last 8760 hours.  BMP: Recent Labs    11/08/21 1605 01/29/22 1550  NA 138  --   K 4.0  --   CL 98  --   CO2 20  --   GLUCOSE 286*  --   BUN 11  --   CALCIUM 9.5  --   CREATININE 0.87 1.00    LIVER FUNCTION TESTS: No results for input(s): "BILITOT", "AST", "ALT", "ALKPHOS", "PROT", "ALBUMIN" in the last 8760 hours.  TUMOR MARKERS: No results for input(s): "AFPTM",  "CEA", "CA199", "CHROMGRNA" in the last 8760 hours.  Assessment and Plan:  Continues to do well 3.5 years status post direct sac puncture and liquid embolic repair of type II endoleak.  No evidence of further enlargement of the excluded aneurysm sac.  Continue annual surveillance.  Probable musculoskeletal back pain/spasms.    1.) Repeat CTA abdomen/pelvis and clinic visit in 1 year. 2.) Gentle exercise and stretching/yoga + OTC magnesium supplementation. F/U with PCP if back pain/spasm worsens.   Thank you for this interesting consult.  I greatly enjoyed meeting BLAYZE HAEN and look forward to participating in their care.  A copy of this report was sent to the requesting provider on this date.  Electronically Signed: Antonietta Jewel Amaru Burroughs 02/05/2022, 11:16 AM   I spent a total of  15 Minutes in face to face in clinical consultation, greater than 50% of which was counseling/coordinating care for history of type 2 endoleak of AAA s/p EVAR.

## 2022-02-12 DIAGNOSIS — E11319 Type 2 diabetes mellitus with unspecified diabetic retinopathy without macular edema: Secondary | ICD-10-CM | POA: Diagnosis not present

## 2022-02-17 NOTE — Progress Notes (Deleted)
Cardiology Office Note:    Date:  02/17/2022   ID:  Christian Mccall, DOB 1955-12-03, MRN [id number]  PCP:  Shon Baton, MD  Cardiologist:  Sinclair Grooms, MD   Referring MD: Shon Baton, MD   No chief complaint on file.   History of Present Illness:    Christian Mccall is a 66 y.o. male with a hx of Coronary artery disease, coronary bypass grafting 2002, abdominal aortic aneurysm with endovascular repair and known endoleak 2012, COPD, type 2 diabetes, prior myocardial infarction, history of CVA, obstructive sleep apnea, essential hypertension, diplopia, and hyperlipidemia   ***  Past Medical History:  Diagnosis Date   Abdominal aortic aneurysm without rupture (HCC)    Anginal pain    Asymmetric SNHL (sensorineural hearing loss) 11/17/2019   Balanitis 11/30/2013   Bulging lumbar disc    "& some in my neck"   C. difficile colitis    2008 OR 09   CAD (coronary artery disease) of artery bypass graft 07/12/2015    Coronary angiogram 2010 :  CONCLUSION:  1. Bypass graft failure with occlusion of the SVG to the RCA.  2. Severe native disease with occlusion of the LAD and RCA.  3. Normal left ventricular function.  4. Patent saphenous vein grafts to the diagonal and to the OM #1.  The      left internal mammary artery graft is also patent.     Cervicalgia 11/12/2012   Change in platelet count    pt states 'has been low since 2012; patient had to receive platelets in surgery in Nov 2016'   Chronic back pain    Cirrhosis    COPD (chronic obstructive pulmonary disease)    Diabetic peripheral neuropathy    Diplopia 01/30/2019   Dyspnea 02/04/2013   Emphysema lung    Endoleak of aortic graft    Enlarged liver    Enlarged prostate    Essential hypertension, benign 05/03/2013   Genital herpes simplex 12/03/2013   GERD (gastroesophageal reflux disease)    Headache    "~ weekly" (04/12/2015)   Heart failure with mid-range ejection fraction (HFmEF) ([place]) 12/03/2021   Cath 11/2021: EF  45-50   Hemiplegia affecting nondominant side 11/12/2012   History of colon polyps    Hyperlipidemia    Impingement syndrome of left shoulder region 02/18/2018   Left facial numbness 01/30/2019   Leg pain    with walking and pain in feet while lying flat   Lumbar back pain 11/12/2012   Mild neurocognitive disorder, unclear etiology 07/31/2021   Multiple lacunar infarcts    REM sleep behaviors    Restless leg syndrome 11/12/2012   Rheumatic fever    AS A CHILD  ?5 YRS OLD   RLS (restless legs syndrome)    Shortness of breath 12/06/2010   while lying flat and with exertion   Snoring    Spleen enlarged    Syphilis, late latent 11/24/2013   Thrombocytopenia    since 2012; can be as low as 54K.    TIA (transient ischemic attack) "several"   "since 2015" (04/12/2015)   Tinnitus aurium, left 11/17/2019   Transient left leg weakness 11/12/2012   Type II diabetes mellitus 2009    Past Surgical History:  Procedure Laterality Date   ABDOMINAL AORTIC ANEURYSM REPAIR  01/16/2011   EVAR   ABDOMINAL AORTIC ANEURYSM REPAIR  04/12/2015   CARDIAC CATHETERIZATION  "several"   COLONOSCOPY     COLONOSCOPY W/ BIOPSIES  COLONOSCOPY WITH PROPOFOL N/A 03/21/2016   Procedure: COLONOSCOPY WITH PROPOFOL;  Surgeon: Arta Silence, MD;  Location: [facility] ENDOSCOPY;  Service: Endoscopy;  Laterality: N/A;   CORONARY ANGIOPLASTY WITH STENT PLACEMENT  "several"   > 4 sents   CORONARY ARTERY BYPASS GRAFT  02/18/2001   "CABG x 4"   Direct sac puncture with liquid embolic repair of Type II endoleak  04/12/2015; 03/05/2017   ESOPHAGOGASTRODUODENOSCOPY (EGD) WITH PROPOFOL N/A 03/21/2016   Procedure: ESOPHAGOGASTRODUODENOSCOPY (EGD) WITH PROPOFOL;  Surgeon: Arta Silence, MD;  Location: [place];  Service: Endoscopy;  Laterality: N/A;   IR AORTAGRAM ABDOMINAL SERIALOGRAM  03/05/2017   IR CT SPINE LTD  03/05/2017   IR EMBO ARTERIAL NOT HEMORR HEMANG INC GUIDE ROADMAPPING  03/05/2017   IR RADIOLOGIST EVAL & MGMT   02/05/2017   IR RADIOLOGIST EVAL & MGMT  04/01/2017   IR RADIOLOGIST EVAL & MGMT  10/01/2017   IR RADIOLOGIST EVAL & MGMT  02/04/2019   IR RADIOLOGIST EVAL & MGMT  09/15/2019   IR RADIOLOGIST EVAL & MGMT  10/03/2020   IR RADIOLOGIST EVAL & MGMT  02/05/2022   LEFT HEART CATH AND CORS/GRAFTS ANGIOGRAPHY N/A 11/13/2018   Procedure: LEFT HEART CATH AND CORS/GRAFTS ANGIOGRAPHY;  Surgeon: Belva Crome, MD;  Location: [place] CV LAB;  Service: Cardiovascular;  Laterality: N/A;   LEFT HEART CATH AND CORS/GRAFTS ANGIOGRAPHY N/A 11/09/2021   Procedure: LEFT HEART CATH AND CORS/GRAFTS ANGIOGRAPHY;  Surgeon: Belva Crome, MD;  Location: [place] [facility];  Service: Cardiovascular;  Laterality: N/A;   PERIPHERAL VASCULAR CATHETERIZATION N/A 03/10/2015   Procedure: Abdominal Aortogram;  Surgeon: Elam Dutch, MD;  Location: [place] [facility];  Service: Cardiovascular;  Laterality: N/A;   RADIOLOGY WITH ANESTHESIA N/A 04/12/2015   Procedure: embolization;  Surgeon: Jacqulynn Cadet, MD;  Location: [place];  Service: Radiology;  Laterality: N/A;   RADIOLOGY WITH ANESTHESIA N/A 03/05/2017   Procedure: Type II Endoleak Repair;  Surgeon: Jacqulynn Cadet, MD;  Location: [place];  Service: Radiology;  Laterality: N/A;   SHOULDER SURGERY Right 07-27-2015   TONSILLECTOMY  ~ 1965    Current Medications: No outpatient medications have been marked as taking for the 02/19/22 encounter (Appointment) with Belva Crome, MD.     Allergies:   Patient has no known allergies.   Social History   Socioeconomic History   Marital status: Married    Spouse name: reba   Number of children: 2   Years of education: 9   Highest education level: 9th grade  Occupational History   Occupation: Retired/Disability  Tobacco Use   Smoking status: Former    Packs/day: 0.00    Years: 30.00    Total pack years: 0.00    Types: Cigarettes    Quit date: 02/17/2001    Years since quitting: 21.0   Smokeless tobacco: Never  Vaping  Use   Vaping Use: Never used  Substance and Sexual Activity   Alcohol use: Yes    Alcohol/week: 1.0 standard drink of alcohol    Types: 1 Cans of beer per week    Comment: occasional   Drug use: No   Sexual activity: Not Currently  Other Topics Concern   Not on file  Social History Narrative   Patient lives at home with his wife.    Right Handed    Lives in a one story motor home    Social Determinants of Health   Financial Resource Strain: Not on file  Food  Insecurity: Not on file  Transportation Needs: Not on file  Physical Activity: Not on file  Stress: Not on file  Social Connections: Not on file     Family History: The patient's family history includes Aneurysm in his maternal aunt, maternal uncle, and mother; Cancer in his sister; Heart attack in his brother, brother, and brother; Heart disease in his brother, brother, mother, and sister; Hyperlipidemia in his father and mother; Hypertension in his brother, father, and mother; Lupus in his sister; Other in his maternal aunt, maternal uncle, and mother.  ROS:   Please see the history of present illness.    *** All other systems reviewed and are negative.  EKGs/Labs/Other Studies Reviewed:    The following studies were reviewed today:  CARDIAC CATH 2023 Diagnostic Dominance: Right  EKG:  EKG ***  Recent Labs: 02/26/2021: TSH 1.370 11/08/2021: BUN 11; Potassium 4.0; Sodium 138 01/22/2022: Hemoglobin 15.1; Platelets 62 01/29/2022: Creatinine, Ser 1.00  Recent Lipid Panel    Component Value Date/Time   CHOL 103 01/30/2019 0412   TRIG 267 (H) 01/30/2019 0412   HDL 25 (L) 01/30/2019 0412   CHOLHDL 4.1 01/30/2019 0412   VLDL 53 (H) 01/30/2019 0412   LDLCALC 25 01/30/2019 0412    Physical Exam:    VS:  There were no vitals taken for this visit.    Wt Readings from Last 3 Encounters:  12/04/21 201 lb (91.2 kg)  11/09/21 199 lb (90.3 kg)  11/08/21 199 lb 12.8 oz (90.6 kg)     GEN: ***. No acute  distress HEENT: Normal NECK: No JVD. LYMPHATICS: No lymphadenopathy CARDIAC: *** murmur. RRR *** gallop, or edema. VASCULAR: *** Normal Pulses. No bruits. RESPIRATORY:  Clear to auscultation without rales, wheezing or rhonchi  ABDOMEN: Soft, non-tender, non-distended, No pulsatile mass, MUSCULOSKELETAL: No deformity  SKIN: Warm and dry NEUROLOGIC:  Alert and oriented x 3 PSYCHIATRIC:  Normal affect   ASSESSMENT:    1. Coronary artery disease of bypass graft of native heart with stable angina pectoris ([place])   2. Essential hypertension, benign   3. Heart failure with mid-range ejection fraction (HFmEF) ([place])   4. Pure hypercholesterolemia   5. Thrombocytopenia (HCC)   6. Carotid artery disease, unspecified laterality, unspecified type ([place])   7. Abdominal aortic aneurysm (AAA) without rupture, unspecified part ([place])    PLAN:    In order of problems listed above:  ***   Medication Adjustments/Labs and Tests Ordered: Current medicines are reviewed at length with the patient today.  Concerns regarding medicines are outlined above.  No orders of the defined types were placed in this encounter.  No orders of the defined types were placed in this encounter.   There are no Patient Instructions on file for this visit.   Signed, Sinclair Grooms, MD  02/17/2022 5:02 PM    [facility]

## 2022-02-19 ENCOUNTER — Ambulatory Visit: Payer: Medicare HMO | Admitting: Interventional Cardiology

## 2022-02-19 DIAGNOSIS — I5022 Chronic systolic (congestive) heart failure: Secondary | ICD-10-CM

## 2022-02-19 DIAGNOSIS — E7800 Pure hypercholesterolemia, unspecified: Secondary | ICD-10-CM

## 2022-02-19 DIAGNOSIS — D696 Thrombocytopenia, unspecified: Secondary | ICD-10-CM

## 2022-02-19 DIAGNOSIS — I10 Essential (primary) hypertension: Secondary | ICD-10-CM

## 2022-02-19 DIAGNOSIS — I779 Disorder of arteries and arterioles, unspecified: Secondary | ICD-10-CM

## 2022-02-19 DIAGNOSIS — E11319 Type 2 diabetes mellitus with unspecified diabetic retinopathy without macular edema: Secondary | ICD-10-CM | POA: Diagnosis not present

## 2022-02-19 DIAGNOSIS — I714 Abdominal aortic aneurysm, without rupture, unspecified: Secondary | ICD-10-CM

## 2022-02-19 DIAGNOSIS — I25708 Atherosclerosis of coronary artery bypass graft(s), unspecified, with other forms of angina pectoris: Secondary | ICD-10-CM

## 2022-02-24 NOTE — Progress Notes (Unsigned)
Cardiology Office Note:    Date:  02/26/2022   ID:  Christian Mccall, DOB December 02, 1955, MRN [id number]  PCP:  Shon Baton, MD  [facility] Cardiologist:  Sinclair Grooms, MD    Referring MD: Shon Baton, MD   Chief Complaint:  F/u for CAD with Angina    Patient Profile: Coronary artery disease  Hx of MI S/p CABG in 2002  Cath 2020: S-RCA, S-OM1, S-D1 patent; L-LAD atretic vs CTO Cath 11/2021: SVG-RCA patent, SVG-OM1 70-80, SVG-D1 occluded (new), LIMA-LAD occluded >>med Rx HFmrEF (heart failure with mildly reduced ejection fraction)  EF 45-50 on Cath in June 2023  Abdominal aortic aneurysm  S/p endovascular repair in 2012 Known endoleak (followed by interventional radiology) Dr. Laurence Ferrari Carotid artery disease  Diabetes mellitus  Hx of CVA Chronic Obstructive Pulmonary Disease OSA Hypertension  Hyperlipidemia  Diplopia  GERD  Thrombocytopenia  Aortic atherosclerosis  Prior CV Studies:   LEFT HEART CATH AND CORS/GRAFTS ANGIOGRAPHY 11/09/2021 LM dist/LAD ost 90 LAD ost to prox 70, prox 100 LCx prox 70; OM1 100, OM2 75 RCA ost 100 L-R collats Patent SVG-distal RCA. Patent SVG-OM1 - diffusely diseased and has a relatively focal mid 70 to 80%  New total occlusion of the saphenous vein graft to the first diagonal which retrofilled the LAD. Known total occlusion of LIMA to LAD (2010) EF 45-50   RECOMMENDATIONS: Consider PCI on the small and diffusely diseased first obtuse marginal saphenous vein graft territory if continued angina.      VAS US CAROTID DUPLEX BILATERAL 08/22/2021 Right Carotid: Velocities in the right ICA are consistent with a 1-39% stenosis. Left Carotid: Velocities in the left ICA are consistent with a 1-39% stenosis.     ECHO COMPLETE WO IMAGING ENHANCING AGENT 01/30/2019 EF 60-65, mild LVH, GRII DD, normal RVSF  History of Present Illness:   Christian Mccall is a 66 y.o. male with the above problem list.  Cardiac  catheterization in June 2023 demonstrated new occlusion of the SVG-D1 which retrofilled the LAD and 80% stenosis in a small SVG-OM.  SVG-RCA was patent.  LIMA-LAD was known to be occluded.  Medical management was recommended unless he had refractory angina at which time PCI of the SVG-OM could be considered.  EF is down to 45-50.  He was last seen in follow-up 12/04/2021.  He had recurrent symptoms while in the office.  Dr. Tamala Julian reviewed his films again and felt options were limited unless he has an escalation in anginal symptoms.  Medical therapy was continued.    He returns for follow-up. He is here with his wife. He notes that, over the past 1.5 months, he has taken NTG ~5 times for chest pain. He has symptoms come on with mild to mod exertion (e.g. carrying a case of water). He is not having his typical L arm pain with this. He has chronic shortness of breath without change. He has not had orthopnea, leg edema, syncope.        Past Medical History:  Diagnosis Date   Abdominal aortic aneurysm without rupture (HCC)    Anginal pain    Asymmetric SNHL (sensorineural hearing loss) 11/17/2019   Balanitis 11/30/2013   Bulging lumbar disc    "& some in my neck"   C. difficile colitis    2008 OR 09   CAD (coronary artery disease) of artery bypass graft 07/12/2015    Coronary angiogram 2010 :  CONCLUSION:  1. Bypass graft failure with occlusion  of the SVG to the RCA.  2. Severe native disease with occlusion of the LAD and RCA.  3. Normal left ventricular function.  4. Patent saphenous vein grafts to the diagonal and to the OM #1.  The      left internal mammary artery graft is also patent.     Cervicalgia 11/12/2012   Change in platelet count    pt states 'has been low since 2012; patient had to receive platelets in surgery in Nov 2016'   Chronic back pain    Cirrhosis    COPD (chronic obstructive pulmonary disease)    Diabetic peripheral neuropathy    Diplopia 01/30/2019   Dyspnea 02/04/2013    Emphysema lung    Endoleak of aortic graft    Enlarged liver    Enlarged prostate    Essential hypertension, benign 05/03/2013   Genital herpes simplex 12/03/2013   GERD (gastroesophageal reflux disease)    Headache    "~ weekly" (04/12/2015)   Heart failure with mid-range ejection fraction (HFmEF) ([place]) 12/03/2021   Cath 11/2021: EF 45-50   Hemiplegia affecting nondominant side 11/12/2012   History of colon polyps    Hyperlipidemia    Impingement syndrome of left shoulder region 02/18/2018   Left facial numbness 01/30/2019   Leg pain    with walking and pain in feet while lying flat   Lumbar back pain 11/12/2012   Mild neurocognitive disorder, unclear etiology 07/31/2021   Multiple lacunar infarcts    REM sleep behaviors    Restless leg syndrome 11/12/2012   Rheumatic fever    AS A CHILD  ?6 YRS OLD   RLS (restless legs syndrome)    Shortness of breath 12/06/2010   while lying flat and with exertion   Snoring    Spleen enlarged    Syphilis, late latent 11/24/2013   Thrombocytopenia    since 2012; can be as low as 54K.    TIA (transient ischemic attack) "several"   "since 2015" (04/12/2015)   Tinnitus aurium, left 11/17/2019   Transient left leg weakness 11/12/2012   Type II diabetes mellitus 2009   Current Medications: Current Meds  Medication Sig   albuterol (PROVENTIL HFA;VENTOLIN HFA) 108 (90 BASE) MCG/ACT inhaler Inhale 2 puffs into the lungs every 6 (six) hours as needed for wheezing or shortness of breath.    aspirin EC 81 MG tablet Take 1 tablet (81 mg total) by mouth daily.   cholecalciferol (VITAMIN D3) 25 MCG (1000 UNIT) tablet Take 1,000 Units by mouth 2 (two) times daily.   clopidogrel (PLAVIX) 75 MG tablet Take 1 tablet (75 mg total) by mouth daily.   empagliflozin (JARDIANCE) 25 MG TABS tablet 1 tablet Orally Once a day   escitalopram (LEXAPRO) 20 MG tablet Take 20 mg by mouth at bedtime.   fenofibrate 160 MG tablet Take 160 mg by mouth every morning.     furosemide (LASIX) 40 MG tablet Take 40 mg by mouth every other day.    insulin glargine (LANTUS SOLOSTAR) 100 UNIT/ML Solostar Pen Inject 55 units at bedtime-patient was dispensed incorrect amount by Pharmacy last Rx refill please dispense correct amount for 90 day supply Subcutaneous as directed   isosorbide mononitrate (IMDUR) 30 MG 24 hr tablet Take 1 tablet (30 mg total) by mouth daily.   losartan (COZAAR) 50 MG tablet Take 50 mg by mouth daily.   metFORMIN (GLUCOPHAGE-XR) 500 MG 24 hr tablet 2 tabs twice daily Orally   metoprolol tartrate (LOPRESSOR) 25 MG  tablet Take 1 tablet (25 mg total) by mouth 2 (two) times daily.   nitroGLYCERIN (NITROSTAT) 0.4 MG SL tablet Place 1 tablet (0.4 mg total) under the tongue every 5 (five) minutes as needed for chest pain (Do not exceed total of 3 doses in 15 minutes, call 911).   NOVOLOG FLEXPEN 100 UNIT/ML FlexPen Inject 14 Units into the skin 3 (three) times daily with meals.   pantoprazole (PROTONIX) 40 MG tablet Take 1 tablet (40 mg total) by mouth daily.   pramipexole (MIRAPEX) 0.25 MG tablet Take 0.25 mg by mouth at bedtime.    pregabalin (LYRICA) 100 MG capsule Take 200 mg by mouth every evening.   ranolazine (RANEXA) 1000 MG SR tablet TAKE 1 TABLET TWICE DAILY   rosuvastatin (CRESTOR) 20 MG tablet Take 20 mg by mouth at bedtime.   terazosin (HYTRIN) 1 MG capsule Take 1 mg by mouth at bedtime.    vitamin B-12 (CYANOCOBALAMIN) 1000 MCG tablet Take 1,000 mcg by mouth every evening.   [DISCONTINUED] metoprolol (LOPRESSOR) 50 MG tablet Take 50 mg by mouth 2 (two) times daily.     Allergies:   Patient has no known allergies.   Social History   Tobacco Use   Smoking status: Former    Packs/day: 0.00    Years: 30.00    Total pack years: 0.00    Types: Cigarettes    Quit date: 02/17/2001    Years since quitting: 21.0   Smokeless tobacco: Never  Vaping Use   Vaping Use: Never used  Substance Use Topics   Alcohol use: Yes    Alcohol/week: 1.0  standard drink of alcohol    Types: 1 Cans of beer per week    Comment: occasional   Drug use: No    Family Hx: The patient's family history includes Aneurysm in his maternal aunt, maternal uncle, and mother; Cancer in his sister; Heart attack in his brother, brother, and brother; Heart disease in his brother, brother, mother, and sister; Hyperlipidemia in his father and mother; Hypertension in his brother, father, and mother; Lupus in his sister; Other in his maternal aunt, maternal uncle, and mother.  Review of Systems  Gastrointestinal:  Negative for hematochezia.  Genitourinary:  Negative for hematuria.     EKGs/Labs/Other Test Reviewed:    EKG:  EKG is  ordered today.  The ekg ordered today demonstrates has bradycardia, HR 51, left axis deviation, no ST-T wave changes, QTc 446  Recent Labs: 11/08/2021: BUN 11; Potassium 4.0; Sodium 138 01/22/2022: Hemoglobin 15.1; Platelets 62 01/29/2022: Creatinine, Ser 1.00   Recent Lipid Panel No results for input(s): "CHOL", "TRIG", "HDL", "VLDL", "LDLCALC", "LDLDIRECT" in the last 8760 hours.   Risk Assessment/Calculations/Metrics:              Physical Exam:    VS:  BP (!) 130/50   Pulse (!) 55   Ht '5\' 11"'$  (1.803 m)   Wt 209 lb (94.8 kg)   SpO2 97%   BMI 29.15 kg/m     Wt Readings from Last 3 Encounters:  02/26/22 209 lb (94.8 kg)  12/04/21 201 lb (91.2 kg)  11/09/21 199 lb (90.3 kg)    Constitutional:      Appearance: Healthy appearance. Not in distress.  Neck:     Vascular: No JVR. JVD normal.  Pulmonary:     Effort: Pulmonary effort is normal.     Breath sounds: No wheezing. No rales.  Cardiovascular:     Normal rate. Regular rhythm. Normal  S1. Normal S2.      Murmurs: There is no murmur.  Edema:    Peripheral edema absent.  Abdominal:     Palpations: Abdomen is soft.  Skin:    General: Skin is warm and dry.  Neurological:     General: No focal deficit present.     Mental Status: Alert and oriented to person,  place and time.          ASSESSMENT & PLAN:   Coronary artery disease of bypass graft of native heart with stable angina pectoris ([place]) S/p MI followed by CABG in 2002. Recent cath in June 2023 demonstrated old occlusion of L-LAD, new occlusion of S-D1, S-OM with 70-80% stenosis and patent S-RCA. The S-OM is small and supplies a small territory. Rec at the time of his cath in June was to manage medically. He is current on beta-blocker and Ranolazine. He is now describing CCS Class II-III angina. His EKG does not demonstrate any acute changes. He is bradycardic with PACs. Otherwise, there are no ST-TW changes. He does not take PDE-5 inhibitors. The patient does not feel that his chest pain is severe but has to take NTG to make it go away. Given complexity of intervening on the S-OM, will attempt to continue med Rx. I discussed his case with Dr. Tamala Julian today. Decrease Metoprolol to 25 mg twice daily  Continue Ranexa 1000  mg twice daily  Start Imdur 30 mg once daily  Continue Crestor 20 mg once daily, ASA 81 mg once daily, Plavix 75 mg once daily F/u in 6 weeks  Essential hypertension, benign BP is controlled. Continue Losartan 50 mg once daily, Terazosin 1 mg daily at bedtime. Decrease Metoprolol to 25 mg twice daily due to bradycardia. Add Imdur 30 mg once daily for angina. If BP increases, we can titrate this if needed.   Heart failure with mid-range ejection fraction (HFmEF) (HCC) Volume status stable. He has chronic shortness of breath from Chronic Obstructive Pulmonary Disease. NYHA IIb. Continue Lasix 40 mg every other day, Jardiance 25 mg once daily, Losartan 50 mg once daily.   Hyperlipidemia Continue Crestor 20 mg once daily, Fenofibrate 160 mg once daily. If Trigs remain high, consider Vascepa.  Abdominal aortic aneurysm without rupture Status post endovascular repair.  Continue follow-up with interventional radiology.           Dispo:  No follow-ups on file.   Medication  Adjustments/Labs and Tests Ordered: Current medicines are reviewed at length with the patient today.  Concerns regarding medicines are outlined above.  Tests Ordered: Orders Placed This Encounter  Procedures   EKG 12-Lead   Medication Changes: Meds ordered this encounter  Medications   metoprolol tartrate (LOPRESSOR) 25 MG tablet    Sig: Take 1 tablet (25 mg total) by mouth 2 (two) times daily.    Dispense:  180 tablet    Refill:  3   isosorbide mononitrate (IMDUR) 30 MG 24 hr tablet    Sig: Take 1 tablet (30 mg total) by mouth daily.    Dispense:  90 tablet    Refill:  3   Signed, Richardson Dopp, PA-C  02/26/2022 6:20 PM    [address], [place], [place]  [address] Phone: [phone]; Fax: [phone]

## 2022-02-26 ENCOUNTER — Encounter: Payer: Self-pay | Admitting: Physician Assistant

## 2022-02-26 ENCOUNTER — Ambulatory Visit: Payer: Medicare HMO | Attending: Interventional Cardiology | Admitting: Physician Assistant

## 2022-02-26 VITALS — BP 130/50 | HR 55 | Ht 71.0 in | Wt 209.0 lb

## 2022-02-26 DIAGNOSIS — I25708 Atherosclerosis of coronary artery bypass graft(s), unspecified, with other forms of angina pectoris: Secondary | ICD-10-CM

## 2022-02-26 DIAGNOSIS — I10 Essential (primary) hypertension: Secondary | ICD-10-CM | POA: Diagnosis not present

## 2022-02-26 DIAGNOSIS — E7800 Pure hypercholesterolemia, unspecified: Secondary | ICD-10-CM

## 2022-02-26 DIAGNOSIS — I714 Abdominal aortic aneurysm, without rupture, unspecified: Secondary | ICD-10-CM

## 2022-02-26 DIAGNOSIS — I5022 Chronic systolic (congestive) heart failure: Secondary | ICD-10-CM

## 2022-02-26 MED ORDER — ISOSORBIDE MONONITRATE ER 30 MG PO TB24: 30.0000 mg | ORAL_TABLET | Freq: Every day | ORAL | 3 refills | Status: DC

## 2022-02-26 MED ORDER — METOPROLOL TARTRATE 25 MG PO TABS: 25.0000 mg | ORAL_TABLET | Freq: Two times a day (BID) | ORAL | 3 refills | Status: AC

## 2022-02-26 NOTE — Assessment & Plan Note (Signed)
Volume status stable. He has chronic shortness of breath from Chronic Obstructive Pulmonary Disease. NYHA IIb. Continue Lasix 40 mg every other day, Jardiance 25 mg once daily, Losartan 50 mg once daily.

## 2022-02-26 NOTE — Assessment & Plan Note (Signed)
Continue Crestor 20 mg once daily, Fenofibrate 160 mg once daily. If Trigs remain high, consider Vascepa.

## 2022-02-26 NOTE — Patient Instructions (Signed)
Medication Instructions:  Your physician has recommended you make the following change in your medication:   REDUCE the Metoprolol to 25 mg taking 1 tablet twice a day  START Imdur 30 mg taking 1 daily   *If you need a refill on your cardiac medications before your next appointment, please call your pharmacy*   Lab Work: None ordered  If you have labs (blood work) drawn today and your tests are completely normal, you will receive your results only by: [place] (if you have MyChart) OR A paper copy in the mail If you have any lab test that is abnormal or we need to change your treatment, we will call you to review the results.   Testing/Procedures: None ordered   Follow-Up: At [facility], you and your health needs are our priority.  As part of our continuing mission to provide you with exceptional heart care, we have created designated Provider Care Teams.  These Care Teams include your primary Cardiologist (physician) and Advanced Practice Providers (APPs -  Physician Assistants and Nurse Practitioners) who all work together to provide you with the care you need, when you need it.  We recommend signing up for the patient portal called "MyChart".  Sign up information is provided on this After Visit Summary.  MyChart is used to connect with patients for Virtual Visits (Telemedicine).  Patients are able to view lab/test results, encounter notes, upcoming appointments, etc.  Non-urgent messages can be sent to your provider as well.   To learn more about what you can do with MyChart, go to [email or web address].    Your next appointment:   6 week(s)  The format for your next appointment:   In Person  Provider:   Sinclair Grooms, MD  or Richardson Dopp, PA-C         Other Instructions   Important Information About Sugar

## 2022-02-26 NOTE — Assessment & Plan Note (Addendum)
S/p MI followed by CABG in 2002. Recent cath in June 2023 demonstrated old occlusion of L-LAD, new occlusion of S-D1, S-OM with 70-80% stenosis and patent S-RCA. The S-OM is small and supplies a small territory. Rec at the time of his cath in June was to manage medically. He is current on beta-blocker and Ranolazine. He is now describing CCS Class II-III angina. His EKG does not demonstrate any acute changes. He is bradycardic with PACs. Otherwise, there are no ST-TW changes. He does not take PDE-5 inhibitors. The patient does not feel that his chest pain is severe but has to take NTG to make it go away. Given complexity of intervening on the S-OM, will attempt to continue med Rx. I discussed his case with Dr. Tamala Julian today. Decrease Metoprolol to 25 mg twice daily  Continue Ranexa 1000  mg twice daily  Start Imdur 30 mg once daily  Continue Crestor 20 mg once daily, ASA 81 mg once daily, Plavix 75 mg once daily F/u in 6 weeks

## 2022-02-26 NOTE — Assessment & Plan Note (Signed)
BP is controlled. Continue Losartan 50 mg once daily, Terazosin 1 mg daily at bedtime. Decrease Metoprolol to 25 mg twice daily due to bradycardia. Add Imdur 30 mg once daily for angina. If BP increases, we can titrate this if needed.

## 2022-02-26 NOTE — Assessment & Plan Note (Signed)
Status post endovascular repair.  Continue follow-up with interventional radiology.

## 2022-03-22 DIAGNOSIS — G894 Chronic pain syndrome: Secondary | ICD-10-CM | POA: Diagnosis not present

## 2022-03-22 DIAGNOSIS — E1140 Type 2 diabetes mellitus with diabetic neuropathy, unspecified: Secondary | ICD-10-CM | POA: Diagnosis not present

## 2022-03-22 DIAGNOSIS — K7469 Other cirrhosis of liver: Secondary | ICD-10-CM | POA: Diagnosis not present

## 2022-03-22 DIAGNOSIS — E11319 Type 2 diabetes mellitus with unspecified diabetic retinopathy without macular edema: Secondary | ICD-10-CM | POA: Diagnosis not present

## 2022-03-22 DIAGNOSIS — R413 Other amnesia: Secondary | ICD-10-CM | POA: Diagnosis not present

## 2022-03-22 DIAGNOSIS — R079 Chest pain, unspecified: Secondary | ICD-10-CM | POA: Diagnosis not present

## 2022-03-22 DIAGNOSIS — E669 Obesity, unspecified: Secondary | ICD-10-CM | POA: Diagnosis not present

## 2022-03-22 DIAGNOSIS — I2510 Atherosclerotic heart disease of native coronary artery without angina pectoris: Secondary | ICD-10-CM | POA: Diagnosis not present

## 2022-03-22 DIAGNOSIS — G629 Polyneuropathy, unspecified: Secondary | ICD-10-CM | POA: Diagnosis not present

## 2022-03-22 DIAGNOSIS — Z794 Long term (current) use of insulin: Secondary | ICD-10-CM | POA: Diagnosis not present

## 2022-03-22 DIAGNOSIS — J449 Chronic obstructive pulmonary disease, unspecified: Secondary | ICD-10-CM | POA: Diagnosis not present

## 2022-03-22 DIAGNOSIS — I119 Hypertensive heart disease without heart failure: Secondary | ICD-10-CM | POA: Diagnosis not present

## 2022-03-22 DIAGNOSIS — E1151 Type 2 diabetes mellitus with diabetic peripheral angiopathy without gangrene: Secondary | ICD-10-CM | POA: Diagnosis not present

## 2022-03-22 DIAGNOSIS — Z23 Encounter for immunization: Secondary | ICD-10-CM | POA: Diagnosis not present

## 2022-03-22 DIAGNOSIS — E785 Hyperlipidemia, unspecified: Secondary | ICD-10-CM | POA: Diagnosis not present

## 2022-03-29 DIAGNOSIS — E1140 Type 2 diabetes mellitus with diabetic neuropathy, unspecified: Secondary | ICD-10-CM | POA: Diagnosis not present

## 2022-04-15 DIAGNOSIS — H11153 Pinguecula, bilateral: Secondary | ICD-10-CM | POA: Diagnosis not present

## 2022-04-15 DIAGNOSIS — H04123 Dry eye syndrome of bilateral lacrimal glands: Secondary | ICD-10-CM | POA: Diagnosis not present

## 2022-04-15 DIAGNOSIS — E119 Type 2 diabetes mellitus without complications: Secondary | ICD-10-CM | POA: Diagnosis not present

## 2022-04-15 DIAGNOSIS — H524 Presbyopia: Secondary | ICD-10-CM | POA: Diagnosis not present

## 2022-04-15 DIAGNOSIS — H40013 Open angle with borderline findings, low risk, bilateral: Secondary | ICD-10-CM | POA: Diagnosis not present

## 2022-04-24 ENCOUNTER — Telehealth: Payer: Self-pay | Admitting: Neurology

## 2022-04-24 NOTE — Telephone Encounter (Signed)
Called patient and left message to call back re:  From: Alda Berthold, DO Sent: 04/23/2022   1:21 PM EST To: Beckie Busing, can you call the patient and see if they wanted to schedule a follow-up visit?  Looks like he is seeing his ophthalmologist, who requested that he follow-up.   Thanks, [employer]

## 2022-04-30 NOTE — Progress Notes (Unsigned)
Follow-up Visit   Date: 05/02/22   Christian Mccall MRN: [id number] DOB: August 06, 1955   Interim History: Christian Mccall is a 66 y.o. right-handed Caucasian male with diabetes mellitus complicated by neuropathy, CAD, AAA, COPD, GERD, hypertension, hyperlipidemia, and hemiplegic migraine returning to the clinic for follow-up of double vision and memory impairment.  The patient was accompanied to the clinic by wife who also provides collateral information.    History of present illness: Starting in August 2020, he began having double vision.  Since this time, he tends to have double vision in the evening occurring about 3-4 times per week, usually worse after a busy day.  Objects are side-by-side.  He has closed one eye but does not recall if that helps.  He also has droopiness of the left eye, which then can move into the right eye.  No associated arm/leg weakness, difficulty swallowing/talking.     He has been diagnosed with hemiplegic migraine after presenting to the ER with left arm/leg weakness in the setting of headache in 01/2019. MRI brain did not show acute stroke.  He has been followed by Dr. Leonie Man at [facility] for this.  He gets weakness + headaches about 2-4 times per month, lasts about 15-30 min.    He has been on disability for cardiac reason for the past 20 years.  He has a [employer], [employer].   UPDATE 02/26/2021:  He continues to have double vision, which is worse with driving after several hours, or late at night when he is watching TV.  He tried mestinon a few times and felt that benefit varied, sometimes it helped, other times it did not.  When driving, his double vision lasts a few minutes, not constant. No weakness or difficulty swallowing/talking.   He complains of progressive problems with memory which is getting worse.  His wife manages medications and finances.  He is driving but admits to getting lost and has been advised to stop driving.    UPDATE 05/01/2022:  He is here  for follow-up visit.  Today, he denies double vision and reports stopping mestinon months ago.  He does, however, complains that his left eye becomes droopy, and sometimes completely closes.  At his last visit, he was referred for SFEMG, however, he was never contacted to set up appointment.  He denies difficulty with swallowing/talking/jaw fatigue/limb weakness.  He is having increased problems with short-term memory, which is getting worse.  Wife has to repeat herself often, as he quickly forgets details of conversation.  In March 2023, he had neuropsychiatric testing was consistent with mild neurocognitive disorder.  There was concern whether he could have features of FTD, therefore PET scan was ordered and returned normal, specifically no evidence of Alzheimer's disease or FTD.  Over the past year, he has other medical conditions, namely, he had MI in May 2023.  He has severe vascular disease and continues to have exertional angina, for which he takes nitroglycerin about once per week.  Medications:  Current Outpatient Medications on File Prior to Visit  Medication Sig Dispense Refill   albuterol (PROVENTIL HFA;VENTOLIN HFA) 108 (90 BASE) MCG/ACT inhaler Inhale 2 puffs into the lungs every 6 (six) hours as needed for wheezing or shortness of breath.      aspirin EC 81 MG tablet Take 1 tablet (81 mg total) by mouth daily.     cholecalciferol (VITAMIN D3) 25 MCG (1000 UNIT) tablet Take 1,000 Units by mouth 2 (two) times daily.  clopidogrel (PLAVIX) 75 MG tablet Take 1 tablet (75 mg total) by mouth daily. 90 tablet 3   empagliflozin (JARDIANCE) 25 MG TABS tablet 1 tablet Orally Once a day     escitalopram (LEXAPRO) 20 MG tablet Take 20 mg by mouth at bedtime.     fenofibrate 160 MG tablet Take 160 mg by mouth every morning.      furosemide (LASIX) 40 MG tablet Take 40 mg by mouth every other day.      insulin glargine (LANTUS SOLOSTAR) 100 UNIT/ML Solostar Pen 60 Units. 60 units daily      isosorbide mononitrate (IMDUR) 30 MG 24 hr tablet Take 1 tablet (30 mg total) by mouth daily. 90 tablet 3   losartan (COZAAR) 50 MG tablet Take 50 mg by mouth daily.     metFORMIN (GLUCOPHAGE-XR) 500 MG 24 hr tablet 2 tabs twice daily Orally     metoprolol tartrate (LOPRESSOR) 25 MG tablet Take 1 tablet (25 mg total) by mouth 2 (two) times daily. (Patient taking differently: Take 25 mg by mouth 2 (two) times daily. One tablet in the morning and 2 tablets in the evening) 180 tablet 3   nitroGLYCERIN (NITROSTAT) 0.4 MG SL tablet Place 1 tablet (0.4 mg total) under the tongue every 5 (five) minutes as needed for chest pain (Do not exceed total of 3 doses in 15 minutes, call 911). 25 tablet 11   NOVOLOG FLEXPEN 100 UNIT/ML FlexPen Inject 14 Units into the skin 3 (three) times daily with meals.     pantoprazole (PROTONIX) 40 MG tablet Take 1 tablet (40 mg total) by mouth daily. 90 tablet 3   pramipexole (MIRAPEX) 0.25 MG tablet Take 0.25 mg by mouth at bedtime.      pregabalin (LYRICA) 100 MG capsule Take 200 mg by mouth every evening.     ranolazine (RANEXA) 1000 MG SR tablet TAKE 1 TABLET TWICE DAILY 90 tablet 2   rosuvastatin (CRESTOR) 20 MG tablet Take 20 mg by mouth at bedtime.     terazosin (HYTRIN) 1 MG capsule Take 1 mg by mouth at bedtime.      vitamin B-12 (CYANOCOBALAMIN) 1000 MCG tablet Take 1,000 mcg by mouth every evening.     No current facility-administered medications on file prior to visit.    Allergies: No Known Allergies  Vital Signs:  BP 138/75   Pulse 90   Ht '5\' 11"'$  (1.803 m)   Wt 213 lb (96.6 kg)   SpO2 93%   BMI 29.71 kg/m   Neurological Exam: MENTAL STATUS including orientation to time, place, person, recent and remote memory, attention span and concentration, language, and fund of knowledge is fairly intact.  Speech is not dysarthric.  CRANIAL NERVES:  No visual field defects.  Pupils equal round and reactive to light.  Normal conjugate, extra-ocular eye movements  in all directions of gaze.  Bilateral mild ptosis worse on the left, no worsening with sustained upgaze.  Face is symmetric. Facial muscles are 5/5 Palate elevates symmetrically.  Tongue is midline.  MOTOR:  Motor strength is 5/5 in all extremities.  No atrophy, fasciculations or abnormal movements.  No pronator drift.  Tone is normal.    MSRs:  Reflexes are 2+/4 throughout, except absent at the ankles.  SENSORY:  Vibration reduced at the ankles bilaterally. [name]  COORDINATION/GAIT:  Normal finger-to- nose-finger.  Intact rapid alternating movements bilaterally.  Gait mildly wide-based and stable, unassisted.   Data: MRI brain wo contrast 01/30/2019: 1. No acute intracranial  abnormality identified. 2. 5 mm focus of mildly increased diffusion signal within the subcortical white matter of the posterior right frontal region, which could reflect a small focus of artifact/T2 shine through or possibly late subacute ischemia. Finding is not felt to be acute in nature. 3. Underlying moderate chronic microvascular ischemic disease with multiple remote lacunar infarcts involving the right caudate, progressed relative to 2015.  CT/A head and neck 8/222/2020: 1. Approximately 50% stenosis of the proximal right ICA secondary to prominent noncalcified plaque. 2. Atherosclerotic changes at the aortic arch and within the common carotid arteries bilaterally without significant stenosis. 3. Atherosclerotic calcifications within the cavernous ICA bilaterally. A 50% stenosis is present on the left. 4. Mild distal disease without a significant proximal stenosis, aneurysm, or branch vessel occlusion within the circle-of-Willis. 5. 50% stenosis at the origin of the right vertebral artery. 6. Mild degenerative changes of the cervical spine.  Labs 11/20/2020:  AChR negative  IMPRESSION/PLAN: Left ptosis, fluctuating.  He no longer has diplopia and does not recall whether trial of mestinon was helpful.  I will restart  mestinon '60mg'$  BID at 9a and 3pm.  To evaluate symptoms, he will also be referred again for SFEMG.  Mild cognitive impairment, worsening short-term memory loss.  Prior neuropsychological testing from March 2023 did not show dementia symdrome and PET scan was normal.  Check TSH and B12.  Repeat neuropsychological testing to look for interval change.  Diabetic neuropathy with numbness in the feet and sensory ataxia. Patient educated on daily foot inspection, fall prevention, and safety precautions around the home.  Hemiplegic migraine, episodic.  Does not warrant preventative therapy  Return to clinic in 4 months  Thank you for allowing me to participate in patient's care.  If I can answer any additional questions, I would be pleased to do so.    Sincerely,    Jaryan Chicoine K. Posey Pronto, DO

## 2022-05-01 ENCOUNTER — Ambulatory Visit: Payer: Medicare HMO | Admitting: Neurology

## 2022-05-01 ENCOUNTER — Encounter: Payer: Self-pay | Admitting: Neurology

## 2022-05-01 VITALS — BP 138/75 | HR 90 | Ht 71.0 in | Wt 213.0 lb

## 2022-05-01 DIAGNOSIS — H02402 Unspecified ptosis of left eyelid: Secondary | ICD-10-CM

## 2022-05-01 DIAGNOSIS — G3184 Mild cognitive impairment, so stated: Secondary | ICD-10-CM | POA: Diagnosis not present

## 2022-05-01 MED ORDER — PYRIDOSTIGMINE BROMIDE 60 MG PO TABS: ORAL_TABLET | ORAL | 3 refills | Status: AC

## 2022-05-01 NOTE — Patient Instructions (Addendum)
Start mestinon '60mg'$  twice daily at 9am and 3pm  Referral for single fiber EMG.  Stop mestinon 24 hours prior to testing  Check TSH and vitamin B12  Formal neurocognitive testing  Return to clinic in 4 months  You have been referred for a neurocognitive evaluation (i.e., evaluation of memory and thinking abilities). Please bring someone with you to this appointment if possible, as it is helpful for the neuropsychologist to hear from both you and another adult who knows you well. Please bring eyeglasses and hearing aids if you wear them and take any medications as you normally would.    The evaluation will take approximately 2-3 hours and has two parts:   The first part is a clinical interview with the neuropsychologist, Dr. Melvyn Novas.  During the interview, the neuropsychologist will speak with you and the individual you brought to the appointment.    The second part of the evaluation is testing with the doctor's technician, aka psychometrician, Hinton Dyer or [employer]. During the testing, the technician will ask you to remember different types of material, solve problems, and answer some questionnaires. Your family member will not be present for this portion of the evaluation.   Please note: We have to reserve several hours of the neuropsychologist's time and the psychometrician's time for your evaluation appointment. As such, there is a No-Show fee of $100. If you are unable to attend any of your appointments, please contact our office as soon as possible to reschedule.

## 2022-05-10 DIAGNOSIS — E11319 Type 2 diabetes mellitus with unspecified diabetic retinopathy without macular edema: Secondary | ICD-10-CM | POA: Diagnosis not present

## 2022-05-12 NOTE — Progress Notes (Addendum)
Cardiology Office Note:    Date:  05/13/2022   ID:  Christian Mccall, DOB [birth year], MRN [id number]  PCP:  Shon Baton, MD  Cardiologist:  Sinclair Grooms, MD   Referring MD: Shon Baton, MD   Chief Complaint  Patient presents with   Coronary Artery Disease   Chest Pain   Hypertension    History of Present Illness:    Christian Mccall is a 66 y.o. male with a hx of Coronary artery disease, coronary bypass grafting 2002 (atresia of LIMA to LAD, occluded saphenous vein graft to first diagonal, patent but diseased SVG to obtuse marginal, patent SVG to distal RCA 11/2021) ,abdominal aortic aneurysm with endovascular repair and known endoleak 2012, COPD, type 2 diabetes, prior myocardial infarction, history of CVA, obstructive sleep apnea, essential hypertension, diplopia, and hyperlipidemia.    The most recent arteriogram was reviewed with the patient and his wife.  He has severe diffuse multivessel coronary disease with total occlusion of the native RCA, obtuse marginal branches of the circumflex, and occlusion of the proximal LAD.  He has diffuse small vessel disease otherwise including a long segmental lesion in the second obtuse marginal which is not supplied by a graft.  A graft to the first obtuse marginal contains significant mid body stenosis but supplies a very small territory.  After this arteriogram in June 2023 we embarked upon Conservation officer, nature.[name]  Unfortunately I have not seen him until now.  In the interval he was started on ranolazine and has not noticed any improvement.  At completion of coronary angiography I had started him on Imdur.  After seeing Christian Mccall metoprolol intensity was decreased to 25 mg twice daily because of low blood pressure (it seems that the patient and wife did this without our instructions).  He has angina with any level of moderate physical activity.  It is responsive to nitroglycerin.  Past Medical History:  Diagnosis Date   Abdominal aortic aneurysm  without rupture (HCC)    Anginal pain    Asymmetric SNHL (sensorineural hearing loss) 11/17/2019   Balanitis 11/30/2013   Bulging lumbar disc    "& some in my neck"   C. difficile colitis    2008 OR 09   CAD (coronary artery disease) of artery bypass graft 07/12/2015    Coronary angiogram 2010 :  CONCLUSION:  1. Bypass graft failure with occlusion of the SVG to the RCA.  2. Severe native disease with occlusion of the LAD and RCA.  3. Normal left ventricular function.  4. Patent saphenous vein grafts to the diagonal and to the OM #1.  The      left internal mammary artery graft is also patent.     Cervicalgia 11/12/2012   Change in platelet count    pt states 'has been low since 2012; patient had to receive platelets in surgery in Nov 2016'   Chronic back pain    Cirrhosis    COPD (chronic obstructive pulmonary disease)    Diabetic peripheral neuropathy    Diplopia 01/30/2019   Dyspnea 02/04/2013   Emphysema lung    Endoleak of aortic graft    Enlarged liver    Enlarged prostate    Essential hypertension, benign 05/03/2013   Genital herpes simplex 12/03/2013   GERD (gastroesophageal reflux disease)    Headache    "~ weekly" (04/12/2015)   Heart failure with mid-range ejection fraction (HFmEF) ([place]) 12/03/2021   Cath 11/2021: EF 45-50   Hemiplegia affecting nondominant side  11/12/2012   History of colon polyps    Hyperlipidemia    Impingement syndrome of left shoulder region 02/18/2018   Left facial numbness 01/30/2019   Leg pain    with walking and pain in feet while lying flat   Lumbar back pain 11/12/2012   Mild neurocognitive disorder, unclear etiology 07/31/2021   Multiple lacunar infarcts    REM sleep behaviors    Restless leg syndrome 11/12/2012   Rheumatic fever    AS A CHILD  ?6 YRS OLD   RLS (restless legs syndrome)    Shortness of breath 12/06/2010   while lying flat and with exertion   Snoring    Spleen enlarged    Syphilis, late latent 11/24/2013    Thrombocytopenia    since 2012; can be as low as 54K.    TIA (transient ischemic attack) "several"   "since 2015" (04/12/2015)   Tinnitus aurium, left 11/17/2019   Transient left leg weakness 11/12/2012   Type II diabetes mellitus 2009    Past Surgical History:  Procedure Laterality Date   ABDOMINAL AORTIC ANEURYSM REPAIR  01/16/2011   EVAR   ABDOMINAL AORTIC ANEURYSM REPAIR  04/12/2015   CARDIAC CATHETERIZATION  "several"   COLONOSCOPY     COLONOSCOPY W/ BIOPSIES     COLONOSCOPY WITH PROPOFOL N/A 03/21/2016   Procedure: COLONOSCOPY WITH PROPOFOL;  Surgeon: Arta Silence, MD;  Location[facility] END[facility]COPY;  Service: Endoscopy;  Laterality: N/A;   CORONARY ANGIOPLASTY WITH STENT PLACEMENT  "several"   > 4 sents   CORONARY ARTERY BYPASS GRAFT  02/18/2001   "CABG x 4"   Direct sac puncture with liquid embolic repair of Type II endoleak  04/12/2015; 03/05/2017   ESOPHAGOGASTRODUODENOSCOPY (EGD) WITH PROPOFOL N/A 03/21/2016   Procedure: ESOPHAGOGASTRODUODENOSCOPY (EGD) WITH PROPOFOL;  Surgeon: Arta Silence, MD;  Location: [place];  Service: Endoscopy;  Laterality: N/A;   IR AORTAGRAM ABDOMINAL SERIALOGRAM  03/05/2017   IR CT SPINE LTD  03/05/2017   IR EMBO ARTERIAL NOT HEMORR HEMANG INC GUIDE ROADMAPPING  03/05/2017   IR RADIOLOGIST EVAL & MGMT  02/05/2017   IR RADIOLOGIST EVAL & MGMT  04/01/2017   IR RADIOLOGIST EVAL & MGMT  10/01/2017   IR RADIOLOGIST EVAL & MGMT  02/04/2019   IR RADIOLOGIST EVAL & MGMT  09/15/2019   IR RADIOLOGIST EVAL & MGMT  10/03/2020   IR RADIOLOGIST EVAL & MGMT  02/05/2022   LEFT HEART CATH AND CORS/GRAFTS ANGIOGRAPHY N/A 11/13/2018   Procedure: LEFT HEART CATH AND CORS/GRAFTS ANGIOGRAPHY;  Surgeon: Belva Crome, MD;  Location: [place] [facility];  Service: Cardiovascular;  Laterality: N/A;   LEFT HEART CATH AND CORS/GRAFTS ANGIOGRAPHY N/A 11/09/2021   Procedure: LEFT HEART CATH AND CORS/GRAFTS ANGIOGRAPHY;  Surgeon: Belva Crome, MD;  Location: [place] [facility];   Service: Cardiovascular;  Laterality: N/A;   PERIPHERAL VASCULAR CATHETERIZATION N/A 03/10/2015   Procedure: Abdominal Aortogram;  Surgeon: Elam Dutch, MD;  Location: [place] [facility];  Service: Cardiovascular;  Laterality: N/A;   RADIOLOGY WITH ANESTHESIA N/A 04/12/2015   Procedure: embolization;  Surgeon: Jacqulynn Cadet, MD;  Location: [place];  Service: Radiology;  Laterality: N/A;   RADIOLOGY WITH ANESTHESIA N/A 03/05/2017   Procedure: Type II Endoleak Repair;  Surgeon: Jacqulynn Cadet, MD;  Location: [place];  Service: Radiology;  Laterality: N/A;   SHOULDER SURGERY Right 07-27-2015   TONSILLECTOMY  ~ 1965    Current Medications: Current Meds  Medication Sig   albuterol (PROVENTIL HFA;VENTOLIN  HFA) 108 (90 BASE) MCG/ACT inhaler Inhale 2 puffs into the lungs every 6 (six) hours as needed for wheezing or shortness of breath.    aspirin EC 81 MG tablet Take 1 tablet (81 mg total) by mouth daily.   cholecalciferol (VITAMIN D3) 25 MCG (1000 UNIT) tablet Take 1,000 Units by mouth 2 (two) times daily.   clopidogrel (PLAVIX) 75 MG tablet Take 1 tablet (75 mg total) by mouth daily.   empagliflozin (JARDIANCE) 25 MG TABS tablet 1 tablet Orally Once a day   escitalopram (LEXAPRO) 20 MG tablet Take 20 mg by mouth at bedtime.   fenofibrate 160 MG tablet Take 160 mg by mouth every morning.    furosemide (LASIX) 40 MG tablet Take 40 mg by mouth every other day.    insulin glargine (LANTUS SOLOSTAR) 100 UNIT/ML Solostar Pen 60 Units. 60 units daily   isosorbide mononitrate (IMDUR) 60 MG 24 hr tablet Take 1 tablet (60 mg total) by mouth daily.   losartan (COZAAR) 50 MG tablet Take 50 mg by mouth daily.   metFORMIN (GLUCOPHAGE-XR) 500 MG 24 hr tablet 2 tabs twice daily Orally   metoprolol tartrate (LOPRESSOR) 25 MG tablet Take 1 tablet (25 mg total) by mouth 2 (two) times daily. (Patient taking differently: Take 25 mg by mouth 2 (two) times daily. One tablet in the morning and 2 tablets in the  evening)   nitroGLYCERIN (NITROSTAT) 0.4 MG SL tablet Place 1 tablet (0.4 mg total) under the tongue every 5 (five) minutes as needed for chest pain (Do not exceed total of 3 doses in 15 minutes, call 911).   NOVOLOG FLEXPEN 100 UNIT/ML FlexPen Inject 14 Units into the skin 3 (three) times daily with meals.   pantoprazole (PROTONIX) 40 MG tablet Take 1 tablet (40 mg total) by mouth daily.   pramipexole (MIRAPEX) 0.25 MG tablet Take 0.25 mg by mouth at bedtime.    pregabalin (LYRICA) 100 MG capsule Take 200 mg by mouth every evening.   pyridostigmine (MESTINON) 60 MG tablet Take 1 tablet at 9am and 3pm.   ranolazine (RANEXA) 1000 MG SR tablet TAKE 1 TABLET TWICE DAILY   rosuvastatin (CRESTOR) 20 MG tablet Take 20 mg by mouth at bedtime.   terazosin (HYTRIN) 1 MG capsule Take 1 mg by mouth at bedtime.    vitamin B-12 (CYANOCOBALAMIN) 1000 MCG tablet Take 1,000 mcg by mouth every evening.   [DISCONTINUED] isosorbide mononitrate (IMDUR) 30 MG 24 hr tablet Take 1 tablet (30 mg total) by mouth daily.     Allergies:   Patient has no known allergies.   Social History   Socioeconomic History   Marital status: Married    Spouse name: reba   Number of children: 2   Years of education: 9   Highest education level: 9th grade  Occupational History   Occupation: Retired/Disability  Tobacco Use   Smoking status: Former    Packs/day: 0.00    Years: 30.00    Total pack years: 0.00    Types: Cigarettes    Quit date: 02/17/2001    Years since quitting: 21.2   Smokeless tobacco: Never  Vaping Use   Vaping Use: Never used  Substance and Sexual Activity   Alcohol use: Yes    Alcohol/week: 1.0 standard drink of alcohol    Types: 1 Cans of beer per week    Comment: occasional   Drug use: No   Sexual activity: Not Currently  Other Topics Concern   Not on file  Social History Narrative   Patient lives at home with his wife.    Right Handed    Lives in a one story motor home    Social  Determinants of Health   Financial Resource Strain: Not on file  Food Insecurity: Not on file  Transportation Needs: Not on file  Physical Activity: Not on file  Stress: Not on file  Social Connections: Not on file     Family History: The patient's family history includes Aneurysm in his maternal aunt, maternal uncle, and mother; Cancer in his sister; Heart attack in his brother, brother, and brother; Heart disease in his brother, brother, mother, and sister; Hyperlipidemia in his father and mother; Hypertension in his brother, father, and mother; Lupus in his sister; Other in his maternal aunt, maternal uncle, and mother.  ROS:   Please see the history of present illness.    If he does not take furosemide he gets short of breath and lower extremity edema.  All other systems reviewed and are negative.  EKGs/Labs/Other Studies Reviewed:    The following studies were reviewed today:  COR ANGIO 11/09/2021: Coronary Diagrams  Diagnostic Dominance: Right   EKG:  EKG not repeated  Recent Labs: 11/08/2021: BUN 11; Potassium 4.0; Sodium 138 01/22/2022: Hemoglobin 15.1; Platelets 62 01/29/2022: Creatinine, Ser 1.00  Recent Lipid Panel    Component Value Date/Time   CHOL 103 01/30/2019 0412   TRIG 267 (H) 01/30/2019 0412   HDL 25 (L) 01/30/2019 0412   CHOLHDL 4.1 01/30/2019 0412   VLDL 53 (H) 01/30/2019 0412   LDLCALC 25 01/30/2019 0412    Physical Exam:    VS:  BP (!) 118/56   Pulse 61   Ht '5\' 11"'$  (1.803 m)   Wt 213 lb (96.6 kg)   SpO2 96%   BMI 29.71 kg/m     Wt Readings from Last 3 Encounters:  05/13/22 213 lb (96.6 kg)  05/01/22 213 lb (96.6 kg)  02/26/22 209 lb (94.8 kg)     GEN: Moderately overweight. No acute distress HEENT: Normal NECK: No JVD. LYMPHATICS: No lymphadenopathy CARDIAC: Apical systolic murmur. RRR no gallop, or edema. VASCULAR:  Normal Pulses. No bruits. RESPIRATORY:  Clear to auscultation without rales, wheezing or rhonchi  ABDOMEN: Soft,  non-tender, non-distended, No pulsatile mass, MUSCULOSKELETAL: No deformity  SKIN: Warm and dry NEUROLOGIC:  Alert and oriented x 3 PSYCHIATRIC:  Normal affect   ASSESSMENT:    1. Coronary artery disease of bypass graft of native heart with stable angina pectoris ([place])   2. Infrarenal abdominal aortic aneurysm (AAA) without rupture ([place])   3. Essential hypertension, benign   4. Heart failure with mid-range ejection fraction (HFmEF) ([place])   5. Pure hypercholesterolemia   6. TIA (transient ischemic attack)    PLAN:    In order of problems listed above:  Titrate to 60 mg/day of isosorbide mononitrate and after 1 week further increase to 120 mg angina if still having angina with minimal activity.  Repeat catheterization and intervention on the diffusely diseased graft to the diagonal and native diffusely diseased second obtuse marginal if continues to have lifestyle limiting angina. Needs to have follow-up for EVAR.  I recommended that he reengage with [facility]. Blood pressure is relatively low.  Beta-blocker therapy was decreased. Continue current therapies and monitor blood pressure as we intensify isosorbide mononitrate Continue statin therapy with LDL target less than 70.   I have recommended that he follow-up with Dr. Casandra Doffing in early February.  If in mid to late December he is still having intractable angina, will likely have coronary angiography repeated during the last week of December with possible PCI of the territory as outlined above.   Medication Adjustments/Labs and Tests Ordered: Current medicines are reviewed at length with the patient today.  Concerns regarding medicines are outlined above.  Orders Placed This Encounter  Procedures   ECHOCARDIOGRAM COMPLETE   Meds ordered this encounter  Medications   isosorbide mononitrate (IMDUR) 60 MG 24 hr tablet    Sig: Take 1 tablet (60 mg total) by mouth daily.    Dispense:  90 tablet    Refill:  3    Dose change  (increased).    Patient Instructions  Medication Instructions:  Your physician has recommended you make the following change in your medication:   1) INCREASE isosorbide mononitrate (Imdur) to '60mg'$  daily for 1 week, call [phone] with an update in 1 week (05/20/2022) on chest pain.  *If you need a refill on your cardiac medications before your next appointment, please call your pharmacy*  Testing/Procedures: Your physician has requested that you have an echocardiogram. Echocardiography is a painless test that uses sound waves to create images of your heart. It provides your doctor with information about the size and shape of your heart and how well your heart's chambers and valves are working. This procedure takes approximately one hour. There are no restrictions for this procedure. Please do NOT wear cologne, perfume, aftershave, or lotions (deodorant is allowed). Please arrive 15 minutes prior to your appointment time.  Follow-Up: At [facility], you and your health needs are our priority.  As part of our continuing mission to provide you with exceptional heart care, we have created designated Provider Care Teams.  These Care Teams include your primary Cardiologist (physician) and Advanced Practice Providers (APPs -  Physician Assistants and Nurse Practitioners) who all work together to provide you with the care you need, when you need it.  Your next appointment:   3 month(s)  The format for your next appointment:   In Person  Provider:   Larae Grooms, MD  Other Instructions Please monitor your blood pressure at home at least 2-3 times per week to be sure the increased dose of Imdur is not lowering it too much.  HOW TO TAKE YOUR BLOOD PRESSURE Rest 5 minutes before taking your blood pressure. Don't  smoke or drink caffeinated beverages for at least 30 minutes before. Take your blood pressure before (not after) you eat. Sit comfortably with your back supported  and both feet on the floor ( don't cross your legs). Elevate your arm to heart level on a table or a desk. Use the proper sized cuff.  It should fit smoothly and snugly around your bare upper arm. There should be enough room to slip a fingertip under the cuff.  The bottom edge of the cuff should be 1 inch above the crease of the elbow.  Please monitor your blood pressure once daily 2 hours after your am medication. If you blood pressure Consistently remains above 053 (systolic) top number or over 90 ( diastolic) bottom number X 3 days  Consecutively.  Please call our office at [phone] or send Mychart message.     Important Information About Sugar         Signed, Sinclair Grooms, MD  05/13/2022 1:11 PM    [facility]

## 2022-05-13 ENCOUNTER — Ambulatory Visit: Payer: Medicare HMO | Attending: Interventional Cardiology | Admitting: Interventional Cardiology

## 2022-05-13 ENCOUNTER — Other Ambulatory Visit (INDEPENDENT_AMBULATORY_CARE_PROVIDER_SITE_OTHER): Payer: Medicare HMO

## 2022-05-13 ENCOUNTER — Encounter: Payer: Self-pay | Admitting: Interventional Cardiology

## 2022-05-13 VITALS — BP 118/56 | HR 61 | Ht 71.0 in | Wt 213.0 lb

## 2022-05-13 DIAGNOSIS — E7800 Pure hypercholesterolemia, unspecified: Secondary | ICD-10-CM | POA: Diagnosis not present

## 2022-05-13 DIAGNOSIS — I25708 Atherosclerosis of coronary artery bypass graft(s), unspecified, with other forms of angina pectoris: Secondary | ICD-10-CM

## 2022-05-13 DIAGNOSIS — I7143 Infrarenal abdominal aortic aneurysm, without rupture: Secondary | ICD-10-CM

## 2022-05-13 DIAGNOSIS — G3184 Mild cognitive impairment, so stated: Secondary | ICD-10-CM

## 2022-05-13 DIAGNOSIS — I5022 Chronic systolic (congestive) heart failure: Secondary | ICD-10-CM

## 2022-05-13 DIAGNOSIS — I5020 Unspecified systolic (congestive) heart failure: Secondary | ICD-10-CM

## 2022-05-13 DIAGNOSIS — I10 Essential (primary) hypertension: Secondary | ICD-10-CM

## 2022-05-13 DIAGNOSIS — E785 Hyperlipidemia, unspecified: Secondary | ICD-10-CM

## 2022-05-13 DIAGNOSIS — G459 Transient cerebral ischemic attack, unspecified: Secondary | ICD-10-CM

## 2022-05-13 DIAGNOSIS — H02402 Unspecified ptosis of left eyelid: Secondary | ICD-10-CM

## 2022-05-13 LAB — VITAMIN B12: Vitamin B-12: 351 pg/mL (ref 211–911)

## 2022-05-13 LAB — TSH: TSH: 1.38 u[IU]/mL (ref 0.35–5.50)

## 2022-05-13 MED ORDER — ISOSORBIDE MONONITRATE ER 60 MG PO TB24: 60.0000 mg | ORAL_TABLET | Freq: Every day | ORAL | 3 refills | Status: AC

## 2022-05-13 NOTE — Patient Instructions (Addendum)
Medication Instructions:  Your physician has recommended you make the following change in your medication:   1) INCREASE isosorbide mononitrate (Imdur) to '60mg'$  daily for 1 week, call [phone] with an update in 1 week (05/20/2022) on chest pain.  *If you need a refill on your cardiac medications before your next appointment, please call your pharmacy*  Testing/Procedures: Your physician has requested that you have an echocardiogram. Echocardiography is a painless test that uses sound waves to create images of your heart. It provides your doctor with information about the size and shape of your heart and how well your heart's chambers and valves are working. This procedure takes approximately one hour. There are no restrictions for this procedure. Please do NOT wear cologne, perfume, aftershave, or lotions (deodorant is allowed). Please arrive 15 minutes prior to your appointment time.  Follow-Up: At [facility], you and your health needs are our priority.  As part of our continuing mission to provide you with exceptional heart care, we have created designated Provider Care Teams.  These Care Teams include your primary Cardiologist (physician) and Advanced Practice Providers (APPs -  Physician Assistants and Nurse Practitioners) who all work together to provide you with the care you need, when you need it.  Your next appointment:   3 month(s)  The format for your next appointment:   In Person  Provider:   Larae Grooms, MD  Other Instructions Please monitor your blood pressure at home at least 2-3 times per week to be sure the increased dose of Imdur is not lowering it too much.  HOW TO TAKE YOUR BLOOD PRESSURE Rest 5 minutes before taking your blood pressure. Don't  smoke or drink caffeinated beverages for at least 30 minutes before. Take your blood pressure before (not after) you eat. Sit comfortably with your back supported and both feet on the floor ( don't cross  your legs). Elevate your arm to heart level on a table or a desk. Use the proper sized cuff.  It should fit smoothly and snugly around your bare upper arm. There should be enough room to slip a fingertip under the cuff.  The bottom edge of the cuff should be 1 inch above the crease of the elbow.  Please monitor your blood pressure once daily 2 hours after your am medication. If you blood pressure Consistently remains above 191 (systolic) top number or over 90 ( diastolic) bottom number X 3 days  Consecutively.  Please call our office at [phone] or send Mychart message.     Important Information About Sugar

## 2022-05-27 ENCOUNTER — Telehealth: Payer: Self-pay | Admitting: Interventional Cardiology

## 2022-05-27 NOTE — Telephone Encounter (Signed)
Left VM for [name] to callback.

## 2022-05-27 NOTE — Telephone Encounter (Signed)
Pt's wife would like a callback from nurse. Please advise

## 2022-05-28 ENCOUNTER — Telehealth: Payer: Self-pay | Admitting: Interventional Cardiology

## 2022-05-28 NOTE — Telephone Encounter (Signed)
Patient's wife would like a call back directly from Dr. Tamala Julian.

## 2022-05-29 NOTE — Telephone Encounter (Signed)
I spoke to Christian Mccall and her son.  Sincere condolences were offered.  She was very Patent attorney and states that [name] had decided he would not have further cardiology interventions.

## 2022-05-30 ENCOUNTER — Other Ambulatory Visit (HOSPITAL_BASED_OUTPATIENT_CLINIC_OR_DEPARTMENT_OTHER): Payer: Medicare HMO

## 2022-06-05 ENCOUNTER — Telehealth: Payer: Self-pay | Admitting: Anesthesiology

## 2022-06-05 ENCOUNTER — Other Ambulatory Visit: Payer: Self-pay | Admitting: Physician Assistant

## 2022-06-05 NOTE — Telephone Encounter (Signed)
Dr Posey Pronto, pt's wife Christian Mccall called to cancel all of pt's appointments and to report that he passed away on 06/16/22.

## 2022-06-10 DEATH — deceased

## 2022-06-12 NOTE — Telephone Encounter (Signed)
Personally called patient's wife to express condolences.

## 2022-08-14 ENCOUNTER — Ambulatory Visit: Payer: Medicare HMO | Admitting: Interventional Cardiology

## 2022-09-02 ENCOUNTER — Ambulatory Visit: Payer: Medicare HMO | Admitting: Neurology

## 2022-11-28 ENCOUNTER — Encounter: Payer: Medicare HMO | Admitting: Psychology

## 2022-12-05 ENCOUNTER — Encounter: Payer: Medicare HMO | Admitting: Psychology
